# Patient Record
Sex: Female | Born: 1960 | Race: Black or African American | Hispanic: No | Marital: Single | Smoking: Former smoker
Health system: Southern US, Community
[De-identification: ages and names within clinical notes are randomized; demographics above are authoritative.]

## PROBLEM LIST (undated history)

## (undated) DIAGNOSIS — C50919 Malignant neoplasm of unspecified site of unspecified female breast: Secondary | ICD-10-CM

## (undated) DIAGNOSIS — C801 Malignant (primary) neoplasm, unspecified: Secondary | ICD-10-CM

## (undated) DIAGNOSIS — J449 Chronic obstructive pulmonary disease, unspecified: Secondary | ICD-10-CM

## (undated) DIAGNOSIS — Z923 Personal history of irradiation: Secondary | ICD-10-CM

## (undated) HISTORY — PX: ABDOMINAL HYSTERECTOMY: SHX81

---

## 2005-07-21 ENCOUNTER — Emergency Department: Payer: Self-pay | Admitting: Unknown Physician Specialty

## 2006-03-18 ENCOUNTER — Emergency Department: Payer: Self-pay | Admitting: Emergency Medicine

## 2007-03-17 ENCOUNTER — Inpatient Hospital Stay: Payer: Self-pay | Admitting: Internal Medicine

## 2007-03-17 ENCOUNTER — Other Ambulatory Visit: Payer: Self-pay

## 2007-05-30 ENCOUNTER — Emergency Department: Payer: Self-pay | Admitting: Emergency Medicine

## 2007-06-19 ENCOUNTER — Inpatient Hospital Stay: Payer: Self-pay | Admitting: Internal Medicine

## 2007-11-05 ENCOUNTER — Emergency Department: Payer: Self-pay | Admitting: Emergency Medicine

## 2007-11-05 ENCOUNTER — Other Ambulatory Visit: Payer: Self-pay

## 2008-02-24 ENCOUNTER — Emergency Department: Payer: Self-pay | Admitting: Emergency Medicine

## 2008-04-17 ENCOUNTER — Emergency Department: Payer: Self-pay | Admitting: Emergency Medicine

## 2009-02-03 DIAGNOSIS — Z923 Personal history of irradiation: Secondary | ICD-10-CM

## 2009-02-03 DIAGNOSIS — C50919 Malignant neoplasm of unspecified site of unspecified female breast: Secondary | ICD-10-CM

## 2009-02-03 HISTORY — PX: BREAST LUMPECTOMY: SHX2

## 2009-02-03 HISTORY — DX: Malignant neoplasm of unspecified site of unspecified female breast: C50.919

## 2009-02-03 HISTORY — PX: BREAST BIOPSY: SHX20

## 2009-02-03 HISTORY — DX: Personal history of irradiation: Z92.3

## 2009-02-07 ENCOUNTER — Ambulatory Visit: Payer: Self-pay | Admitting: Family Medicine

## 2009-03-20 ENCOUNTER — Ambulatory Visit: Payer: Self-pay | Admitting: Surgery

## 2009-03-30 ENCOUNTER — Ambulatory Visit: Payer: Self-pay | Admitting: Surgery

## 2009-04-04 ENCOUNTER — Ambulatory Visit: Payer: Self-pay | Admitting: Surgery

## 2009-04-26 ENCOUNTER — Ambulatory Visit: Payer: Self-pay | Admitting: Surgery

## 2009-05-04 ENCOUNTER — Ambulatory Visit: Payer: Self-pay | Admitting: Radiation Oncology

## 2009-05-15 ENCOUNTER — Ambulatory Visit: Payer: Self-pay | Admitting: Radiation Oncology

## 2009-06-03 ENCOUNTER — Ambulatory Visit: Payer: Self-pay | Admitting: Radiation Oncology

## 2009-06-18 ENCOUNTER — Ambulatory Visit: Payer: Self-pay | Admitting: Podiatry

## 2009-06-22 ENCOUNTER — Ambulatory Visit: Payer: Self-pay | Admitting: Podiatry

## 2009-07-04 ENCOUNTER — Ambulatory Visit: Payer: Self-pay | Admitting: Radiation Oncology

## 2009-08-03 ENCOUNTER — Ambulatory Visit: Payer: Self-pay | Admitting: Radiation Oncology

## 2009-12-11 ENCOUNTER — Ambulatory Visit: Payer: Self-pay | Admitting: Podiatry

## 2009-12-14 ENCOUNTER — Ambulatory Visit: Payer: Self-pay | Admitting: Podiatry

## 2010-08-24 ENCOUNTER — Emergency Department: Payer: Self-pay | Admitting: *Deleted

## 2011-06-22 ENCOUNTER — Inpatient Hospital Stay: Payer: Self-pay | Admitting: Internal Medicine

## 2011-06-22 LAB — CBC
HCT: 44.6 % (ref 35.0–47.0)
HGB: 14.7 g/dL (ref 12.0–16.0)
MCH: 30.5 pg (ref 26.0–34.0)
Platelet: 280 10*3/uL (ref 150–440)
WBC: 8.8 10*3/uL (ref 3.6–11.0)

## 2011-06-22 LAB — COMPREHENSIVE METABOLIC PANEL
Albumin: 3.7 g/dL (ref 3.4–5.0)
Anion Gap: 8 (ref 7–16)
Co2: 29 mmol/L (ref 21–32)
Creatinine: 1.25 mg/dL (ref 0.60–1.30)
EGFR (Non-African Amer.): 49 — ABNORMAL LOW
Potassium: 4.1 mmol/L (ref 3.5–5.1)
SGOT(AST): 68 U/L — ABNORMAL HIGH (ref 15–37)
SGPT (ALT): 97 U/L — ABNORMAL HIGH

## 2011-06-22 LAB — CK TOTAL AND CKMB (NOT AT ARMC): CK-MB: 2.9 ng/mL (ref 0.5–3.6)

## 2011-06-22 LAB — TROPONIN I: Troponin-I: <0.02 ng/mL

## 2011-06-23 DIAGNOSIS — R069 Unspecified abnormalities of breathing: Secondary | ICD-10-CM

## 2011-06-23 LAB — HEMOGLOBIN A1C: Hemoglobin A1C: 5 % (ref 4.2–6.3)

## 2011-06-23 LAB — CBC WITH DIFFERENTIAL/PLATELET
Basophil #: 0 10*3/uL (ref 0.0–0.1)
Eosinophil #: 0 10*3/uL (ref 0.0–0.7)
Eosinophil %: 0 %
Lymphocyte #: 0.7 10*3/uL — ABNORMAL LOW (ref 1.0–3.6)
Monocyte #: 0.1 x10 3/mm — ABNORMAL LOW (ref 0.2–0.9)
Neutrophil %: 89.5 %
RDW: 12.5 % (ref 11.5–14.5)

## 2011-06-23 LAB — COMPREHENSIVE METABOLIC PANEL
Albumin: 3.4 g/dL (ref 3.4–5.0)
Anion Gap: 6 — ABNORMAL LOW (ref 7–16)
BUN: 10 mg/dL (ref 7–18)
Co2: 27 mmol/L (ref 21–32)
Creatinine: 0.72 mg/dL (ref 0.60–1.30)
Glucose: 161 mg/dL — ABNORMAL HIGH (ref 65–99)
Potassium: 4.7 mmol/L (ref 3.5–5.1)
Sodium: 137 mmol/L (ref 136–145)
Total Protein: 8 g/dL (ref 6.4–8.2)

## 2011-06-28 LAB — CULTURE, BLOOD (SINGLE)

## 2011-07-25 ENCOUNTER — Inpatient Hospital Stay: Payer: Self-pay | Admitting: Internal Medicine

## 2011-07-25 LAB — CBC
MCH: 30 pg (ref 26.0–34.0)
MCHC: 32.6 g/dL (ref 32.0–36.0)
MCV: 92 fL (ref 80–100)
RBC: 4.62 10*6/uL (ref 3.80–5.20)
RDW: 12.5 % (ref 11.5–14.5)
WBC: 7.3 10*3/uL (ref 3.6–11.0)

## 2011-07-25 LAB — BASIC METABOLIC PANEL
Anion Gap: 4 — ABNORMAL LOW (ref 7–16)
Co2: 29 mmol/L (ref 21–32)
Creatinine: 0.71 mg/dL (ref 0.60–1.30)
EGFR (African American): >60
EGFR (Non-African Amer.): >60
Osmolality: 274 (ref 275–301)
Potassium: 3.7 mmol/L (ref 3.5–5.1)
Sodium: 138 mmol/L (ref 136–145)

## 2011-07-25 LAB — CK TOTAL AND CKMB (NOT AT ARMC)
CK, Total: 107 U/L (ref 21–215)
CK-MB: 3.4 ng/mL (ref 0.5–3.6)

## 2011-07-25 LAB — TROPONIN I: Troponin-I: <0.02 ng/mL

## 2011-09-02 ENCOUNTER — Ambulatory Visit: Payer: Self-pay | Admitting: Family Medicine

## 2011-10-02 ENCOUNTER — Ambulatory Visit: Payer: Self-pay | Admitting: General Surgery

## 2011-10-15 ENCOUNTER — Ambulatory Visit: Payer: Self-pay | Admitting: General Surgery

## 2011-10-16 ENCOUNTER — Inpatient Hospital Stay: Payer: Self-pay | Admitting: Internal Medicine

## 2011-10-16 LAB — BASIC METABOLIC PANEL
Chloride: 106 mmol/L (ref 98–107)
Co2: 29 mmol/L (ref 21–32)
EGFR (African American): >60
EGFR (Non-African Amer.): >60
Glucose: 88 mg/dL (ref 65–99)
Osmolality: 279 (ref 275–301)
Potassium: 3.6 mmol/L (ref 3.5–5.1)

## 2011-10-16 LAB — HEPATIC FUNCTION PANEL A (ARMC)
Alkaline Phosphatase: 81 U/L (ref 50–136)
Bilirubin, Direct: <0.05 mg/dL (ref 0.00–0.20)
Bilirubin,Total: 0.7 mg/dL (ref 0.2–1.0)
SGOT(AST): 83 U/L — ABNORMAL HIGH (ref 15–37)
SGPT (ALT): 129 U/L — ABNORMAL HIGH (ref 12–78)
Total Protein: 8 g/dL (ref 6.4–8.2)

## 2011-10-16 LAB — CBC
MCH: 30.3 pg (ref 26.0–34.0)
MCV: 92 fL (ref 80–100)
Platelet: 246 10*3/uL (ref 150–440)
RBC: 4.89 10*6/uL (ref 3.80–5.20)
RDW: 13.2 % (ref 11.5–14.5)

## 2011-10-16 LAB — CK TOTAL AND CKMB (NOT AT ARMC): CK, Total: 99 U/L (ref 21–215)

## 2011-10-16 LAB — TROPONIN I
Troponin-I: 0.07 ng/mL — ABNORMAL HIGH
Troponin-I: 0.06 ng/mL — ABNORMAL HIGH

## 2011-10-17 LAB — LIPID PANEL
HDL Cholesterol: 52 mg/dL (ref 40–60)
Triglycerides: 57 mg/dL (ref 0–200)
VLDL Cholesterol, Calc: 11 mg/dL (ref 5–40)

## 2011-10-17 LAB — TROPONIN I: Troponin-I: 0.04 ng/mL

## 2011-10-27 ENCOUNTER — Ambulatory Visit: Payer: Self-pay | Admitting: Gynecologic Oncology

## 2011-10-28 ENCOUNTER — Ambulatory Visit: Payer: Self-pay | Admitting: Gynecologic Oncology

## 2011-11-02 LAB — COMPREHENSIVE METABOLIC PANEL
Albumin: 3.5 g/dL (ref 3.4–5.0)
Alkaline Phosphatase: 84 U/L (ref 50–136)
BUN: 9 mg/dL (ref 7–18)
Bilirubin,Total: 0.5 mg/dL (ref 0.2–1.0)
Calcium, Total: 8.6 mg/dL (ref 8.5–10.1)
Chloride: 106 mmol/L (ref 98–107)
Creatinine: 1.08 mg/dL (ref 0.60–1.30)
SGPT (ALT): 107 U/L — ABNORMAL HIGH (ref 12–78)
Total Protein: 7.5 g/dL (ref 6.4–8.2)

## 2011-11-02 LAB — CBC WITH DIFFERENTIAL/PLATELET
Basophil #: 0.1 10*3/uL (ref 0.0–0.1)
Basophil %: 1.1 %
Eosinophil #: 0.5 10*3/uL (ref 0.0–0.7)
HGB: 13.9 g/dL (ref 12.0–16.0)
Lymphocyte %: 25.1 %
MCH: 29.9 pg (ref 26.0–34.0)
MCHC: 32.9 g/dL (ref 32.0–36.0)
Monocyte #: 0.8 x10 3/mm (ref 0.2–0.9)
Monocyte %: 8.7 %
Neutrophil %: 59.7 %
RDW: 12.6 % (ref 11.5–14.5)

## 2011-11-03 ENCOUNTER — Inpatient Hospital Stay: Payer: Self-pay | Admitting: Internal Medicine

## 2011-11-04 ENCOUNTER — Ambulatory Visit: Payer: Self-pay | Admitting: Gynecologic Oncology

## 2011-11-08 LAB — CULTURE, BLOOD (SINGLE)

## 2011-11-11 ENCOUNTER — Ambulatory Visit: Payer: Self-pay

## 2011-11-11 LAB — CBC
MCHC: 34.1 g/dL (ref 32.0–36.0)
MCV: 91 fL (ref 80–100)
Platelet: 277 10*3/uL (ref 150–440)
RDW: 12.7 % (ref 11.5–14.5)
WBC: 8.9 10*3/uL (ref 3.6–11.0)

## 2011-11-18 ENCOUNTER — Inpatient Hospital Stay: Payer: Self-pay

## 2011-11-19 LAB — HEMATOCRIT: HCT: 35.1 % (ref 35.0–47.0)

## 2012-06-22 ENCOUNTER — Emergency Department: Payer: Self-pay | Admitting: Unknown Physician Specialty

## 2012-06-22 LAB — BASIC METABOLIC PANEL
BUN: 21 mg/dL — ABNORMAL HIGH (ref 7–18)
Chloride: 107 mmol/L (ref 98–107)
Co2: 27 mmol/L (ref 21–32)
Creatinine: 1.64 mg/dL — ABNORMAL HIGH (ref 0.60–1.30)
EGFR (African American): 41 — ABNORMAL LOW
EGFR (Non-African Amer.): 35 — ABNORMAL LOW
Glucose: 113 mg/dL — ABNORMAL HIGH (ref 65–99)
Osmolality: 279 (ref 275–301)
Sodium: 138 mmol/L (ref 136–145)

## 2012-06-22 LAB — CBC
HCT: 41 % (ref 35.0–47.0)
HGB: 13.7 g/dL (ref 12.0–16.0)
MCH: 29.7 pg (ref 26.0–34.0)
MCHC: 33.5 g/dL (ref 32.0–36.0)
MCV: 89 fL (ref 80–100)
RBC: 4.61 10*6/uL (ref 3.80–5.20)

## 2012-07-14 ENCOUNTER — Inpatient Hospital Stay: Payer: Self-pay | Admitting: Internal Medicine

## 2012-07-14 LAB — CBC WITH DIFFERENTIAL/PLATELET
Basophil #: 0.1 10*3/uL (ref 0.0–0.1)
Basophil %: 1.7 %
Eosinophil #: 0.8 10*3/uL — ABNORMAL HIGH (ref 0.0–0.7)
Eosinophil %: 10 %
HCT: 41 % (ref 35.0–47.0)
HGB: 13.8 g/dL (ref 12.0–16.0)
Lymphocyte #: 2.3 10*3/uL (ref 1.0–3.6)
MCHC: 33.7 g/dL (ref 32.0–36.0)
Monocyte %: 8.3 %
Neutrophil #: 3.9 10*3/uL (ref 1.4–6.5)
Platelet: 244 10*3/uL (ref 150–440)

## 2012-07-14 LAB — BASIC METABOLIC PANEL
Anion Gap: 3 — ABNORMAL LOW (ref 7–16)
BUN: 13 mg/dL (ref 7–18)
Chloride: 105 mmol/L (ref 98–107)
Co2: 32 mmol/L (ref 21–32)
Potassium: 4 mmol/L (ref 3.5–5.1)
Sodium: 140 mmol/L (ref 136–145)

## 2012-07-15 LAB — CBC WITH DIFFERENTIAL/PLATELET
Basophil #: 0 10*3/uL (ref 0.0–0.1)
Eosinophil #: 0 10*3/uL (ref 0.0–0.7)
Eosinophil %: 0.3 %
HCT: 39.9 % (ref 35.0–47.0)
HGB: 13.5 g/dL (ref 12.0–16.0)
Lymphocyte %: 7 %
MCHC: 33.8 g/dL (ref 32.0–36.0)
MCV: 89 fL (ref 80–100)
Monocyte #: 0 x10 3/mm — ABNORMAL LOW (ref 0.2–0.9)
Monocyte %: 0.7 %
Neutrophil #: 6.5 10*3/uL (ref 1.4–6.5)
Neutrophil %: 91.5 %
Platelet: 240 10*3/uL (ref 150–440)
RBC: 4.49 10*6/uL (ref 3.80–5.20)
WBC: 7.1 10*3/uL (ref 3.6–11.0)

## 2012-07-15 LAB — BASIC METABOLIC PANEL
BUN: 14 mg/dL (ref 7–18)
Calcium, Total: 8.9 mg/dL (ref 8.5–10.1)
Creatinine: 1 mg/dL (ref 0.60–1.30)
EGFR (African American): >60
Glucose: 220 mg/dL — ABNORMAL HIGH (ref 65–99)
Potassium: 3.8 mmol/L (ref 3.5–5.1)

## 2012-07-15 LAB — TSH: Thyroid Stimulating Horm: 0.353 u[IU]/mL — ABNORMAL LOW

## 2012-07-15 LAB — T4, FREE: Free Thyroxine: 0.96 ng/dL (ref 0.76–1.46)

## 2012-07-16 LAB — BASIC METABOLIC PANEL
Anion Gap: 6 — ABNORMAL LOW (ref 7–16)
Chloride: 105 mmol/L (ref 98–107)
Co2: 29 mmol/L (ref 21–32)
EGFR (African American): >60
EGFR (Non-African Amer.): >60
Potassium: 4.2 mmol/L (ref 3.5–5.1)

## 2012-07-16 LAB — CK-MB: CK-MB: 1.3 ng/mL (ref 0.5–3.6)

## 2012-07-16 LAB — CK
CK, Total: 48 U/L (ref 21–215)
CK, Total: 36 U/L (ref 21–215)

## 2012-07-24 ENCOUNTER — Emergency Department: Payer: Self-pay | Admitting: Emergency Medicine

## 2012-07-24 LAB — CBC
HCT: 39.7 % (ref 35.0–47.0)
HGB: 13.4 g/dL (ref 12.0–16.0)
MCHC: 33.7 g/dL (ref 32.0–36.0)
MCV: 89 fL (ref 80–100)
RBC: 4.44 10*6/uL (ref 3.80–5.20)
WBC: 14 10*3/uL — ABNORMAL HIGH (ref 3.6–11.0)

## 2012-07-24 LAB — CK TOTAL AND CKMB (NOT AT ARMC): CK-MB: 1.2 ng/mL (ref 0.5–3.6)

## 2012-07-24 LAB — BASIC METABOLIC PANEL
BUN: 16 mg/dL (ref 7–18)
Calcium, Total: 8.3 mg/dL — ABNORMAL LOW (ref 8.5–10.1)
Chloride: 106 mmol/L (ref 98–107)
Potassium: 3.7 mmol/L (ref 3.5–5.1)

## 2012-07-24 LAB — PROTIME-INR: Prothrombin Time: 12 secs (ref 11.5–14.7)

## 2012-10-03 ENCOUNTER — Emergency Department: Payer: Self-pay | Admitting: Emergency Medicine

## 2012-10-03 LAB — BASIC METABOLIC PANEL
Calcium, Total: 8.5 mg/dL (ref 8.5–10.1)
Co2: 31 mmol/L (ref 21–32)
Creatinine: 0.98 mg/dL (ref 0.60–1.30)
EGFR (Non-African Amer.): >60
Potassium: 3.7 mmol/L (ref 3.5–5.1)
Sodium: 140 mmol/L (ref 136–145)

## 2012-10-03 LAB — TROPONIN I: Troponin-I: <0.02 ng/mL

## 2012-10-03 LAB — CBC
HCT: 41.4 % (ref 35.0–47.0)
MCH: 30.2 pg (ref 26.0–34.0)
Platelet: 325 10*3/uL (ref 150–440)
RBC: 4.65 10*6/uL (ref 3.80–5.20)
RDW: 12.5 % (ref 11.5–14.5)

## 2012-12-13 ENCOUNTER — Emergency Department: Payer: Self-pay | Admitting: Emergency Medicine

## 2012-12-13 LAB — BASIC METABOLIC PANEL
Anion Gap: 5 — ABNORMAL LOW (ref 7–16)
BUN: 13 mg/dL (ref 7–18)
Calcium, Total: 8.2 mg/dL — ABNORMAL LOW (ref 8.5–10.1)
Creatinine: 0.79 mg/dL (ref 0.60–1.30)
EGFR (African American): >60
Osmolality: 281 (ref 275–301)
Sodium: 140 mmol/L (ref 136–145)

## 2012-12-13 LAB — CBC
HCT: 38.4 % (ref 35.0–47.0)
HGB: 13.1 g/dL (ref 12.0–16.0)
MCHC: 34.2 g/dL (ref 32.0–36.0)
MCV: 88 fL (ref 80–100)
Platelet: 256 10*3/uL (ref 150–440)
RDW: 12.7 % (ref 11.5–14.5)

## 2013-11-18 ENCOUNTER — Ambulatory Visit: Payer: Self-pay | Admitting: Physician Assistant

## 2014-05-10 ENCOUNTER — Inpatient Hospital Stay
Admit: 2014-05-10 | Disposition: A | Discharge status: Home / Self Care | Payer: Self-pay | Attending: Internal Medicine | Admitting: Internal Medicine

## 2014-05-10 LAB — COMPREHENSIVE METABOLIC PANEL
AST: 68 U/L — AB
Albumin: 3.7 g/dL
Alkaline Phosphatase: 60 U/L
Anion Gap: 7 (ref 7–16)
BUN: 12 mg/dL
Bilirubin,Total: 0.4 mg/dL
CALCIUM: 8.5 mg/dL — AB
CHLORIDE: 106 mmol/L
Co2: 29 mmol/L
Creatinine: 0.84 mg/dL
EGFR (Non-African Amer.): >60
Glucose: 96 mg/dL
Potassium: 3.4 mmol/L — ABNORMAL LOW
SGPT (ALT): 86 U/L — ABNORMAL HIGH
SODIUM: 142 mmol/L
Total Protein: 7.2 g/dL

## 2014-05-10 LAB — CBC
HCT: 36.6 % (ref 35.0–47.0)
HGB: 12.1 g/dL (ref 12.0–16.0)
MCH: 29.1 pg (ref 26.0–34.0)
MCHC: 33 g/dL (ref 32.0–36.0)
MCV: 88 fL (ref 80–100)
Platelet: 258 10*3/uL (ref 150–440)
RBC: 4.15 10*6/uL (ref 3.80–5.20)
RDW: 12.5 % (ref 11.5–14.5)
WBC: 8.6 10*3/uL (ref 3.6–11.0)

## 2014-05-10 LAB — CK TOTAL AND CKMB (NOT AT ARMC)
CK, TOTAL: 148 U/L
CK-MB: 4.4 ng/mL

## 2014-05-10 LAB — TROPONIN I

## 2014-05-10 LAB — APTT: ACTIVATED PTT: 27.5 s (ref 23.6–35.9)

## 2014-05-10 LAB — PROTIME-INR
INR: 1
Prothrombin Time: 12.9 secs

## 2014-05-11 LAB — BASIC METABOLIC PANEL
ANION GAP: 6 — AB (ref 7–16)
BUN: 13 mg/dL
CO2: 27 mmol/L
Calcium, Total: 8.3 mg/dL — ABNORMAL LOW
Chloride: 105 mmol/L
Creatinine: 0.69 mg/dL
Glucose: 175 mg/dL — ABNORMAL HIGH
Potassium: 4.6 mmol/L
SODIUM: 138 mmol/L

## 2014-05-11 LAB — CBC WITH DIFFERENTIAL/PLATELET
BASOS PCT: 0.2 %
Basophil #: 0 10*3/uL (ref 0.0–0.1)
Eosinophil #: 0 10*3/uL (ref 0.0–0.7)
Eosinophil %: 0.1 %
HCT: 35.1 % (ref 35.0–47.0)
HGB: 11.2 g/dL — ABNORMAL LOW (ref 12.0–16.0)
LYMPHS ABS: 0.6 10*3/uL — AB (ref 1.0–3.6)
LYMPHS PCT: 10.5 %
MCH: 28.4 pg (ref 26.0–34.0)
MCHC: 31.9 g/dL — ABNORMAL LOW (ref 32.0–36.0)
MCV: 89 fL (ref 80–100)
MONOS PCT: 0.9 %
Monocyte #: 0 x10 3/mm — ABNORMAL LOW (ref 0.2–0.9)
Neutrophil #: 5 10*3/uL (ref 1.4–6.5)
Neutrophil %: 88.3 %
Platelet: 255 10*3/uL (ref 150–440)
RBC: 3.94 10*6/uL (ref 3.80–5.20)
RDW: 12.6 % (ref 11.5–14.5)
WBC: 5.7 10*3/uL (ref 3.6–11.0)

## 2014-05-15 LAB — CULTURE, BLOOD (SINGLE)

## 2014-05-23 NOTE — H&P (Signed)
PATIENT NAME:  [name], [name] MR#:  [id number] DATE OF BIRTH:  [birth year]  DATE OF ADMISSION:  11/03/2011  PRIMARY CARE PHYSICIAN: Dr. Salome Holmes REFERRING PHYSICIAN: Dr. Pollie Friar   CHIEF COMPLAINT: Increased shortness of breath.   HISTORY OF PRESENT ILLNESS: [name] [name] is a 54 year old African American female with history of chronic obstructive pulmonary disease, tobacco abuse. She continued to smoke despite her recent admission when she was admitted on 10/16/2011, discharged on 10/20/2011. She was smoking 1/2 pack a day. She tells me that after discharge from the hospital after a few days, in particular after she finished her oral prednisone tapering, she started to have increased shortness of breath. This had progressed over the last one week. She decided to stop smoking five days ago, however, her symptoms worsened and in the last 24 hours she was barely able to breathe. She was brought to the hospital, placed on BiPAP as she was in severe respiratory distress. Patient is now in the process to be admitted to the hospital for further evaluation and treatment. While she is in the Emergency Department she started to improve. They were able to get her off the BiPAP.   REVIEW OF SYSTEMS: CONSTITUTIONAL: Denies any fever. No chills. No night sweats but had mild fatigue. EYES: No blurring of vision. No double vision. ENT: No hearing impairment. No sore throat. No dysphagia. CARDIOVASCULAR: No chest pain but she had shortness of breath and wheezing. No syncope. No edema. RESPIRATORY: Reports wheezing and progressive increase in shortness of breath and respiratory distress. No chest pain. No cough. No sputum production. No hemoptysis. GASTROINTESTINAL: No abdominal pain. No vomiting. No diarrhea. GENITOURINARY: No dysuria. No frequency of urination. MUSCULOSKELETAL: No joint pain or swelling. No muscular pain or swelling. INTEGUMENTARY: No skin rash. No ulcers. NEUROLOGY: No focal weakness. No  seizure activity. No headache. PSYCHIATRY: No anxiety. No depression right now but she has history of anxiety. ENDOCRINE: No polyuria or polydipsia. No heat or cold intolerance.   PAST MEDICAL HISTORY: 1. Her recent admission was 09/12, discharged on 10/20/2011 treated for acute on chronic respiratory failure secondary to obstructive pulmonary disease exacerbation.  2. Tobacco abuse.  3. Chronic hepatitis C.  4. Recent finding of complex right ovarian cyst. She is having outpatient work-up with OB/GYN and oncology.  5. Breast cancer. 6. Drug abuse. 7. Depression, anxiety.   PAST SURGICAL HISTORY:  1. Breast cancer status post lumpectomy.  2. Status post hysterectomy.   SOCIAL HABITS: Chronic smoker, recently was smoking 1/2 pack a day. She quit five days ago. No alcohol or drug abuse currently.   FAMILY HISTORY: Her mother died from lung cancer. There is family history of bipolar disorder.   SOCIAL HISTORY: She is single, lives at home and she has three children. She lives on disability.   ADMISSION MEDICATIONS:  1. Singulair 10 mg a day. 2. Atrovent q.6 hours p.r.n. 3. Advair 250/50, 1 puff twice a day.  4. Spiriva 1 inhalation once a day.  5. Quetiapine 50 mg at bedtime.  6. Lexapro 10 mg a day. 7. Aspirin 81 mg a day. 8. Ativan 0.5 mg q.8 hours p.r.n.  9. Acetaminophen with oxycodone 5/325 q.6 hours p.r.n. for pain.   ALLERGIES: Penicillin causing skin rash and latex causing skin rash.   PHYSICAL EXAMINATION:  VITAL SIGNS: Blood pressure 120/73, respiratory rate 24 now is down to 20, pulse 99, temperature 98.4, oxygen saturation 96% while she is on oxygen.   GENERAL APPEARANCE: Middle-aged female  sitting on a stretcher in no acute distress at time of my examination. Her breathing is much better than earlier.   HEAD/NECK: No pallor. No icterus. No cyanosis.   ENT: Hearing was normal. Nasal mucosa, lips, tongue were normal. She is edentulous and has dentures.   EYES:  Normal eyelids and conjunctiva. Pupils about 5 mm, equal and reactive to light.   NECK: Supple. Trachea at midline. No thyromegaly. No cervical lymphadenopathy. No masses.   HEART: Normal S1, S2. No S3 or S4. No murmur. No gallop. No carotid bruits.   RESPIRATORY: Normal breathing pattern at time of my examination, few scattered wheezing and prolonged expiratory phase. No rales.   ABDOMEN: Soft without tenderness. No hepatosplenomegaly. No masses. No hernias.   SKIN: No ulcers. No subcutaneous nodules.   MUSCULOSKELETAL: No joint swelling. No clubbing.   NEUROLOGIC: Cranial nerves II through XII are intact. No focal motor deficit.   PSYCHIATRIC: Patient is alert, oriented x3. Mood and affect were normal.   LABORATORY, DIAGNOSTIC, AND RADIOLOGICAL DATA: Chest x-ray showed no consolidation, no effusion. Heart size was normal. There is prominence of the minor fissure on the right side. This is unchanged compared to her last chest x-ray. EKG showed normal sinus rhythm at rate of 97 per minute. Unremarkable EKG. Serum glucose 95, BUN 9, creatinine 1.08, sodium 143, potassium 4.4. Total protein 7.5, albumin 3.5, bilirubin 0.5, alkaline phosphatase 84, AST elevated 77 with ALT 107; this is comparable to her levels earlier in September, almost the same. CBC showed white count 8000, hemoglobin 13, hematocrit 42, platelet count 301.   ASSESSMENT:  1. Acute exacerbation of chronic obstructive pulmonary disease, probably precipitated by noncompliance and continuation of smoking.  2. Tobacco abuse.  3. Chronic hepatitis C. 4. Complex right ovarian cyst under workup by OB/GYN and oncology.   OTHER MEDICAL PROBLEMS:  1. Chronic hepatitis C.  2. Depression, anxiety.  3. History of breast cancer status post lumpectomy. 4. History of hysterectomy.   PLAN: Will admit the patient to the medical floor. Oxygen supplementation. Bronchodilator therapy with DuoNebs q.4 hours while awake, IV Solu-Medrol.  Continue home medications as listed above. I advised the patient to quit smoking and to keep home environment dust free. She is agreeable to that but she requested nicotine patch. I will start her on 14 mg daily.   TIME SPENT EVALUATING THIS PATIENT: Took more than 50 minutes.   ____________________________ Clovis Pu. Lenore Manner, MD amd:cms D: 11/03/2011 02:02:55 ET T: 11/03/2011 08:16:14 ET JOB#: [id number]  cc: Clovis Pu. Lenore Manner, MD, <Dictator> Salome Holmes, MD Mike Craze Irven Coe MD ELECTRONICALLY SIGNED 11/06/2011 22:28

## 2014-05-23 NOTE — Op Note (Signed)
PATIENT NAME:  [name] [name], [name] [name] MR#:  [id number] DATE OF BIRTH:  1961/01/17  DATE OF PROCEDURE:  11/18/2011  PREOPERATIVE DIAGNOSIS: Pelvic mass.   POSTOPERATIVE DIAGNOSIS: Mucinous cystadenoma of the right ovary.   PROCEDURES PERFORMED:  1. Exploratory laparotomy. 2. Lysis of adhesions.   3. Bilateral salpingo-oophorectomy.  4. Appendectomy.  5. Peritoneal biopsies. 6. Omental biopsies.   SURGEON: Weber Cooks, MD   ASSISTANT: Wonda Cheng. Rosenow, MD   ESTIMATED BLOOD LOSS: Ms. 75 mL.   COMPLICATIONS: None.   INDICATION FOR SURGERY: Ms. Males is a 54 year old patient who presented with a slightly complex pelvic mass. Therefore, decision was made to proceed with surgery.   FINDINGS: A 15 cm mass originating from the right ovary, smooth with some adhesion to the right pelvic sidewall but no excrescences or papillations. Normal left adnexa except for adhesions. No peritoneal lesion. Appendix, colon, and small bowel normal.    OPERATIVE REPORT: The patient had just completed laparoscopic cholecystectomy by Dr. Jamal Collin.   A midline incision was placed with a sharp knife and carried down through the fascia. The peritoneum was identified and entered. Incision was extended cephalad and caudad. Exploration was done with the above-mentioned findings. Adhesions between omentum and abdominal wall were lysed. Then a retractor was placed.   The bowel was inspected and found to be normal. It was then packed away with lap sponges. The pelvic sidewall was entered on the right side. Vessels and ureter were identified. The infundibulopelvic ligament was clamped, cut, simply ligated and stitch ligated with 0 Vicryl. The ureter was then freed from the peritoneum and pushed laterally so that adhesions between the mass and the pelvic sidewall could be lysed using cautery. A clamp was placed around the scars to the vaginal apex. Thus the mass could be completely removed and intact. Clamp was  replaced by a stitch using stitch ligature using 0 Vicryl.   Attention was then directed towards the left pelvic sidewall. Here the ovary was normal. The pelvic sidewall was entered. Vessels and ureter were identified. The infundibulopelvic ligament was clamped, cut, and simply ligated as well as stitch ligated using 0 Vicryl. Thus, the ovary was freed from the pelvic sidewall and the clamp was placed across the adhesions towards the vaginal apex. Thus, the ovary and tube could be removed in toto. The clamp was replaced by a stitch ligature using 0 Vicryl. Irrigation of the pelvis was performed. Biopsies were taken from the pelvic peritoneum. Several vessels had to be cauterized until adequate hemostasis was confirmed.   Attention was then directed towards the appendectomy. The mesentery was clamped, cut, and ligated using 0 Vicryl. The base of the appendix was clamped twice. Then a purse-string suture was placed and after removal of the appendix the stump was tied twice and buried in the purse-string suture. Hemostasis was adequate in this area.   Due to the adhesions, there were several holes within the lower omentum. Therefore, the lower omentum was removed by serially placing clamps and cutting pedicles which were ligated with 0 Vicryl. Hemostasis was noted to be adequate.   Irrigation was performed and adequate hemostasis confirmed in all areas. Lap sponges and retractors were removed. The fascia was closed with a running #1 loop PDS suture. The skin was closed with staples.   The patient tolerated the procedure well and was taken to the recovery room in stable condition. Pad, sponge, needle, and instrument counts were correct x2. The postoperative urine was clear.   ____________________________  Weber Cooks, MD bem:drc D: 11/18/2011 12:19:30 ET T: 11/18/2011 12:42:06 ET JOB#: [id number] cc: Weber Cooks, MD, <Dictator> Weber Cooks MD ELECTRONICALLY SIGNED 11/25/2011 17:18

## 2014-05-23 NOTE — Discharge Summary (Signed)
PATIENT NAME:  [name] [name], [name] [name] MR#:  [id number] DATE OF BIRTH:  [birth year]  DATE OF ADMISSION:  11/03/2011 DATE OF DISCHARGE:  11/04/2011  PRIMARY CARE PHYSICIAN: Salome Holmes, MD  DISCHARGE DIAGNOSES:  1. Acute respiratory failure.  2. Acute on chronic obstructive pulmonary disease.  3. Tobacco abuse.  4. Left ovarian complex cyst, has outpatient follow-up.   IMAGING STUDIES: Chest x-ray showed no acute abnormalities. It did show possible bronchitis versus interstitial pneumonitis.   CONSULTANTS: None.   ADMITTING HISTORY AND PHYSICAL: Please see detailed history and physical dictated by Dr. Lenore Manner on 11/03/2011. In brief, this is a 54 year old African American female patient with history of chronic obstructive pulmonary disease and tobacco abuse who presented to the Emergency Room complaining of worsening shortness of breath. The patient was found to be wheezing with no pneumonia on the x-ray and was placed on BiPAP and admitted to the hospitalist service.   HOSPITAL COURSE: The patient was transitioned from BiPAP to nasal cannula oxygen of 3 liters, continued on IV steroids, nebulizer, and antibiotics of Levaquin with which the patient improved well. Her cultures have been negative. On the day of discharge, the patient is saturating 91% on room air on ambulation and is being discharged home. She does have oxygen at home to use p.r.n. The patient will follow up with her primary care physician within a week.   On the day of discharge, the patient's lungs have no wheezing, good air entry, and is being discharged home in fair condition.   DISCHARGE MEDICATIONS:  1. Aspirin 81 mg oral once a day.  2. Escitalopram 10 mg oral once a day.  3. Seroquel 50 mg oral once a day.  4. Lorazepam 0.5 mg oral every eight hours as needed for anxiety.  5. Atrovent 2 puffs inhaled four times daily as needed.  6. Advair Diskus 250/50 inhaled twice a day.  7. Spiriva 18 mcg inhaled once a day.   8. Singulair 10 mg oral once a day.  9. Acetaminophen/oxycodone 325/5 mg one tablet orally every six hours as needed for pain.  10. Nicotine patch one patch transdermal once a day.  11. Prednisone 10 mg, start at 60 mg on day one and taper over 12 days.   DISCHARGE INSTRUCTIONS: Follow with primary care physician within a week. The patient will be on a regular diet with activity as tolerated. Use oxygen as needed, which is already at home. The patient is to return to the Emergency Room if she has worsening of her symptoms. This plan was discussed with the patient who has verbalized understanding and is okay with the plan.   TIME SPENT: Time spent today on discharge dictation along with coordinating care and counseling of the patient was 35 minutes. ____________________________ Leia Alf Tahjai Schetter, MD srs:slb D: 11/04/2011 14:16:11 ET T: 11/05/2011 10:08:43 ET JOB#: [id number]  cc: Alveta Heimlich R. Tykia Mellone, MD, <Dictator> Salome Holmes, MD Neita Carp MD ELECTRONICALLY SIGNED 11/06/2011 11:20

## 2014-05-23 NOTE — Op Note (Signed)
PATIENT NAME:  [name] [name], [name] [name] MR#:  [id number] DATE OF BIRTH:  22-Apr-1960  DATE OF PROCEDURE:  11/18/2011  PREOPERATIVE DIAGNOSES:  1. Cholelithiasis.  2. Ovarian cyst.   POSTOPERATIVE DIAGNOSES:  1. Cholelithiasis. 2. Ovarian cyst. 3. Small abdominal wall nodule under the peritoneum. 4. Four adhesions in the lower abdomen.   PROCEDURES PERFORMED: Laparoscopy, cholecystectomy with intraoperative cholangiogram, and excision of abdominal wall nodule.   DESCRIPTION OF PROCEDURE: The patient was put to sleep in the supine position, on the operating table. The abdomen was prepped and draped out as a sterile field. A small vertical incision was made just below the umbilicus and the fascia was exposed and lifted up and a Veress needle with the InnerD[employer] sleeve positioned in the peritoneal cavity and verified with the hanging drop method. Pneumoperitoneum was obtained and a 10 mm port was then placed. The camera was introduced with good visualization. The findings on scanning the abdomen revealed a low-lying liver which was mildly firm in consistency but did not show any nodules or other abnormalities. There appeared to be a moderate amount of adhesions involving the lower abdomen, in the suprapubic region, mostly from the omentum, and also noted in the abdominal wall a firm nodular lesion the size of a marble located just to the right of midline below the level of the port site place. The known ovarian cyst was not visible because of the adhesions. Attention was directed to the gallbladder. Epigastric and two lateral 5 mm ports were placed and the gallbladder was satisfactorily retracted cephalad. The Hartmann's pouch area was lifted out to expose the cystic duct, the cystic artery, and the cystic duct node. The common bile duct was also visualized and noted to be fairly normal in size. The cystic duct was fairly long. It was first freed and Kumar clamp and catheter were positioned and cholangiogram  was obtained. There was good filling of the bile ducts, both proximal and distal. There was a fairly long and tortuous cystic duct. There did not appear to be any filling defects in the common bile duct nor was there any obstruction to flow. The catheter was used to decompress the gallbladder and then removed. The cystic duct was hemoclipped and cut. The cystic artery was freed, hemoclipped and cut, and the gallbladder was dissected free from its bed using cautery for control of bleeding. Blood loss from this procedure itself was less than 20 mL. After ensuring hemostasis, the camera was positioned with the 5 mm scope, in the epigastric port site, and the gallbladder was brought out through the umbilical port site with a retrieval bag. Following this the ports were then removed. The port sites, other than for the umbilicus  area, were closed with subcuticular 4-0 Vicryl. The port site incision was extended downward and with careful entry into the abdominal cavity adhesions of the omentum were then taken down using cautery and ligatures of 3-0 Vicryl. The nodular lesion, which was about 1.5 cm in size, was excised out from in front of the peritoneum and sent off separately. The adhesions after being cleared had good access to the pelvic area showing a fairly large left ovarian cyst. At this point, the procedure was turned over to Dr. Laurey Morale and Dr. Jacquelyne Balint for completion of ovarian cystectomy and removal of the remnant of the uterus and any additional procedures deemed necessary based on frozen section from the ovarian cyst. This portion will be dictated them. The remained stable throughout the procedure, up to  this point.  ____________________________ S.Robinette Haines, MD sgs:slb D: 11/18/2011 10:12:22 ET T: 11/18/2011 10:44:03 ET JOB#: [id number]  cc: Synthia Innocent. Jamal Collin, MD, <Dictator> [facility] Robinette Haines MD ELECTRONICALLY SIGNED 11/20/2011 17:02

## 2014-05-23 NOTE — H&P (Signed)
PATIENT NAME:  [name] [name], ENGEBRETSEN MR#:  [id number] DATE OF BIRTH:  03-06-1960  DATE OF ADMISSION:  10/16/2011  PRIMARY CARE PHYSICIAN: Salome Holmes, MD    REFERRING PHYSICIAN:   Ferman Hamming, MD   CHIEF COMPLAINT: Shortness of breath, cough, and wheezing for three days.   HISTORY OF PRESENT ILLNESS: The patient is a 54 year old African American female with a history of chronic obstructive pulmonary disease, tobacco abuse, hepatitis C, remote history of drug abuse, breast cancer status post lumpectomy, presented to the ED with shortness of breath, cough, and wheezing for the past three days. The patient is alert, awake, and oriented. The patient said she got a cold about five days ago and started to have a cough, sputum, shortness of breath, and wheezing; but she ran out of Atrovent a few days and symptoms are getting worse, so she came to the ED for further evaluation. Her oxygen saturation was 91% on room air, and she was treated with Solu-Medrol nebulizer. Symptoms got better, however, her troponin level is 0.07. She was treated with aspirin and admitted for chronic obstructive pulmonary disease exacerbation.   PAST MEDICAL HISTORY: As mentioned above:  1. Chronic obstructive pulmonary disease.  2. Hepatitis C. 3. Tobacco abuse. 4. History of drug abuse. 5. Depression. 6. Breast cancer.   PAST SURGICAL HISTORY:  1. Breast cancer, status post lumpectomy.  2. Status post hysterectomy.   SOCIAL HISTORY: She smokes 1/2 pack a day for many years. She denies any alcohol drinking or illicit drugs.    FAMILY HISTORY: Positive for lung cancer and bipolar disorder.   ALLERGIES: Penicillin and latex.    HOME MEDICATIONS:  1. Spiriva 18 mcg inhalation once daily.  2. Singulair 10 mg p.o. daily.  3. Seroquel 25 mg p.o. daily.  4. Zithromax 250 mg p.o. daily.  5. Atrovent HFA 17 mcg inhalation, 2 puffs inhaled as needed.  6. Duo-Neb  nebulizer p.r.n.  7. Advair 250 mcg/50 mcg  inhalation, 1 puff inhalation twice daily.  REVIEW OF SYSTEMS: CONSTITUTIONAL: The patient denies any fever, chills but has  headache, dizziness and weakness. EYES: No double vision or blurred vision. ENT: No postnasal drip, epistaxis, slurred speech, or dysphagia. CARDIOVASCULAR: Chest pain with coughing but no palpitations, orthopnea, or nocturnal dyspnea. No leg edema. PULMONARY: Positive for cough, sputum, shortness of breath, wheezing, but no hemoptysis. GI: No abdominal pain, nausea, vomiting, or diarrhea. No melena or bloody stools. GU: No dysuria, hematuria, or incontinence. SKIN: No rash or jaundice. HEMATOLOGY: No easy bruising or bleeding. NEUROLOGY: No syncope, loss of consciousness or seizure.   PHYSICAL EXAMINATION:  VITAL SIGNS: Temperature 97.8, blood pressure 118/68 oxygen saturation 99% on room air, pulse 109, respirations 18.   GENERAL: The patient is alert, awake, oriented, in no acute distress.   HEENT: Pupils are round, equal, reactive to light and accommodation. Moist oral mucosa. Clear oropharynx.   NECK: Supple. No JVD or carotid bruits. No lymphadenopathy. No thyromegaly.   CARDIOVASCULAR: S1, S2 regular rate, mild tachycardia. No murmurs or gallops.   PULMONARY: Bilateral air entry, mild wheezing. No crackles or rales. No use of accessory muscles to breathe.   ABDOMEN: Soft. No distention or tenderness. No organomegaly. Bowel sounds present.  EXTREMITIES: No edema, clubbing, or cyanosis. No calf tenderness.   SKIN: No rash or jaundice.   NEUROLOGY: No syncope, alert and oriented x3. No focal deficit. Power five out of five. Sensation intact. Deep tendon reflexes 2+.   LABORATORY, DIAGNOSTIC AND RADIOLOGICAL DATA:  CT angiogram showed no pulmonary embolus, but there were emphysematous changes in both lungs, mild atelectasis versus scarring. No evidence of pneumonia. Chest x-ray showed mild atelectasis and/or pneumonia in the lung bases. WBC 6.3, hemoglobin 14.8,  platelets 246, glucose 88, BUN 7, creatinine 0.79. Electrolytes are normal. CK 99, CK-MB 1.3, and troponin 0.07. EKG showed normal sinus rhythm at 78 beats per minute.   IMPRESSION:  1. Chronic obstructive pulmonary disease exacerbation.  2. Tachycardia.  3. Elevated troponin.  4. Tobacco abuse.  5. History of breast cancer.   PLAN OF TREATMENT:  1. The patient will be admitted to a Medical floor with telemonitor.  We will continue Solu-Medrol nebulizer,  Topamax, continue Advair, Singulair and Spiriva.  2. For Elevated troponin, we will  start aspirin and follow up troponin level and lipid panel.  3. Smoking cessation, was counseled for about six minutes.   I discussed the patient's situation and plan of treatment with the patient.   TIME SPENT: About 55 minutes. ____________________________ Demetrios Loll, MD qc:cbb D: 10/16/2011 15:07:53 ET T: 10/16/2011 16:10:27 ET JOB#: [id number]  cc: Demetrios Loll, MD, <Dictator> Salome Holmes, MD Demetrios Loll MD ELECTRONICALLY SIGNED 10/17/2011 18:31

## 2014-05-23 NOTE — Discharge Summary (Signed)
PATIENT NAME:  [name] [name], [name] [name] MR#:  [id number] DATE OF BIRTH:  [birth year]  DATE OF ADMISSION:  10/16/2011 DATE OF DISCHARGE:  10/20/2011  ADMITTING PHYSICIAN: Demetrios Loll, MD   DISCHARGING PHYSICIAN: Gladstone Lighter, MD  PRIMARY CARE PHYSICIAN: Salome Holmes, MD  PRIMARY OB/GYN: Verlene Mayer, MD  CONSULTANTS: None.  DISCHARGE DIAGNOSES:  1. Acute on chronic respiratory failure secondary to obstructive pulmonary disease exacerbation.  2. Tobacco use disorder.  3. Anxiety.  4. History of breast cancer status post left breast lumpectomy and radiation. 5. Complex right ovarian cyst, undergoing further work-up by OB/GYN.   DISCHARGE MEDICATIONS:  1. Singulair 10 mg p.o. daily.  2. Quetiapine 50 mg p.o. at bedtime.  3. Atrovent MDI two puffs every 6 hours p.r.n.  4. Atrovent nebulizer 25 mL every 6 hours as needed for shortness. 5. Advair 250/50 one puff twice a day.  6. Spiriva 18 mcg capsule one inhalation daily.  7. Prednisone taper 50 mg p.o. daily and taper off by 10 mg every day.  8. Aspirin 81 mg p.o. daily.  9. Percocet 5/325 mg one tablet every six hours p.r.n. for pain.  10. Lexapro 10 mg p.o. daily.  11. Ativan 0.5 mg every 8 hours p.r.n. for anxiety.   DISCHARGE HOME OXYGEN: Currently none, but the patient does have home oxygen.   DISCHARGE DIET: Low-sodium diet.   DISCHARGE ACTIVITY: As tolerated.  FOLLOWUP INSTRUCTIONS:  1. Follow up with Dr. Jacquelyne Balint as scheduled in the next week.  2. PCP followup in 1 to 2 weeks.  3. Smoking cessation advised.  DISCHARGE LABS/RADIOLOGIC STUDIES: Chest x-ray prior to discharge is showing mild lenticular opacities in mid and lower lung similar to prior and secondary to atelectasis.   LDL 59, HDL 52, total cholesterol 122 and triglycerides 57. Last set of troponin was normal at 0.04.   CT of the chest with contrast is showing emphysematous changes, mild atelectasis, and no evidence of acute PE or pneumonia or  lymphadenopathy.   WBC 6.3, hemoglobin 14.8, hematocrit 45.1, and platelet count 246.   Sodium 141, potassium 3.6, chloride 106, bicarbonate 29, BUN 7, creatinine 0.79, glucose 88, and calcium 8.7. ALT 129, AST 83, alkaline phosphatase 81, total bilirubin 7.7, and albumin 3.6.   BRIEF HOSPITAL COURSE: Ms. Reeser is a 54 year old African American female with past medical history significant for smoking and prior history of chronic obstructive pulmonary disease, on home oxygen, with history of breast cancer and also being worked up for complicated right ovarian cyst who presented secondary to upper respiratory symptoms with worsening wheezing and also dyspnea and ran out of her medications, especially the inhalers and nebulizer solution. So she was admitted for chronic obstructive pulmonary disease exacerbation. 1. Acute on chronic obstructive pulmonary disease exacerbation. She was started on IV Solu-Medrol. Chest x-ray and CT of chest did not reveal any other causes other than chronic obstructive pulmonary disease. Clinically she is improved respiratory wise and her lungs are clear prior to discharge, so Solu-Medrol is being tapered to oral prednisone. She is on MDIs, she is on Advair, Spiriva, Singulair, and also nebulizer treatments at home p.r.n. She also has home oxygen at home. However, her saturations were more than 90% at rest and on exertion at the time of discharge.  2. Anxiety. The patient has been going through a lot, according to her, secondary to being diagnosed with recent right ovarian cyst that could be malignant and prior history of breast cancer. Anxiety symptoms improved with Ativan  p.r.n. and was also started on Lexapro while in the hospital.  She was already on 25 mg of Seroquel at home. That has been increased to 50 mg at bedtime. For further changes, she needs to be following up with her PCP.  3. Tobacco use disorder. She was strongly counseled against smoking, especially with her  worsening chronic obstructive pulmonary disease and risk of cancer.  4. Complex right ovarian cyst. She is following with Dr. Laurey Morale from OB/GYN who has referred her to see Dr. Jacquelyne Balint and she has an appointment coming up sometime this week. She was advised to keep that appointment. 5. Her course has been otherwise uneventful in the hospital. She did have an episode of chest pain, which was mostly secondary to GI causes and relieved with Mylanta.   CODE STATUS: FULL CODE.  DISCHARGE CONDITION: Stable.   DISCHARGE DISPOSITION: Home.   TIME SPENT ON DISCHARGE: 45 minutes. ____________________________ Gladstone Lighter, MD rk:slb D: 10/20/2011 15:43:26 ET T: 10/21/2011 11:10:19 ET JOB#: [id number]  cc: Gladstone Lighter, MD, <Dictator> Salome Holmes, MD Wonda Cheng. Laurey Morale, MD Weber Cooks, MD Gladstone Lighter MD ELECTRONICALLY SIGNED 10/21/2011 17:08

## 2014-05-26 NOTE — Discharge Summary (Signed)
PATIENT NAME:  [name] [name], [name] [name] MR#:  [id number] DATE OF BIRTH:  05-26-1960  DATE OF ADMISSION:  07/14/2012 DATE OF DISCHARGE:  07/16/2012  1. Acute-on-chronic respiratory failure due to chronic obstructive pulmonary disease exacerbation.  2.  History of hepatitis C. 3.  Breast cancer.  4. Current smoker.   CONDITION ON DISCHARGE: Stable.   CODE STATUS: FULL CODE.   MEDICATIONS ON DISCHARGE:  1.  Aspirin 81 mg once a day.  2.  Atrovent 2 puffs 4 times a day as needed for shortness of breath.  3.  Singulair 10 mg oral tablet once a day.  4.  Albuterol/ipratropium 3 mL inhaled 4 times a day as needed for shortness of breath.  5.  Lorazepam 2 mg oral tablet 3 times a day as needed for anxiety.  6.  Seroquel 25 mg oral tablet once a day.  7.  Prednisone 10 mg tablets, start 60 mg and taper x by 5 mg daily until complete.  8.  Advair Diskus 1 puff inhaled 2 times a day.  9.  Spiriva 10 mcg inhalation capsule once a day.  10.  Levofloxacin 750 mg oral tablet once a day for 3 days. 11. Nicotine patch, transdermal once a day.   HOME OXYGEN: Yes. Oxygen delivery at home, 1 liter nasal cannula as needed; already has at home.   DIET: Regular diet with supplement, Ensure dietary supplement. Frequency 2 times per day.   DIET CONSISTENCY: Regular.   ACTIVITY LIMITATION: As tolerated.   TIMEFRAME FOR FOLLOWUP: 1 to 2 weeks with primary care physician; routine followup with PMD, Dr. Salome Holmes, primary care physician.   HISTORY OF PRESENT ILLNESS: The patient is 54 year old African American female with a past medical history of COPD, lives on oxygen as needed disease, with hepatitis C, tobacco abuse and drug abuse, depression, and a history of breast cancer, who presented to the ER with a chief complaint of shortness of breath and a productive cough for 4 days. She admitted that she  still smoked 1 to 2 packs a day, and started at the age of 36. Denies any sick contacts. No fever. The  patient was given breathing treatments . She was feeling better as the ER physician had planned to discharge the patient home, but during ambulation pulse ox dropped to 85% on room air and she was admitted for a COPD exacerbation.   HOSPITAL COURSE AND STAY: The patient was feeling much better  the next day, and she was still smoking a half-pack per day. Smoking cessation counseling was done. Walking in the morning caused just minimal exacerbation, but she was able to manage with oxygen so her respiratory failure, with COPD was managed with Solu-Medrol, and was also given Levaquin and oxygen with high steroids, started on Robitussin for her cough.   Other medical issues addressed in the hospital:   1.  Hepatitis C and breast cancer: She was advised to continue on outpatient follow-ups.  2.  Depression: She was continued on her medication.  3.  Tobacco abuse: She was counseled for 3 to 4 minutes for smoking cessation. She was feeling comfortable to leave for home after one day of treatment in hospital, and so we discharged her home.   IMPORTANT LAB RESULTS IN THE HOSPITAL: Creatinine was normal at 0.98. Troponin was less than 0.02. Thyroid-stimulating hormone 0.353, but free thyroxin was 0.196 WBC 7.8, hemoglobin 13.8, platelet count 244, and MCV 88. D-dimer was 0.27 at the beginning.   Chest x-ray  was showing no acute cardiopulmonary disease.   Total time spent on this discharge: Forty-five minutes.      ____________________________ Ceasar Lund Anselm Jungling, MD vgv:dm D: 07/20/2012 08:24:38 ET T: 07/20/2012 10:23:27 ET JOB#: [id number]  cc: Ceasar Lund. Anselm Jungling, MD, <Dictator> Salome Holmes, MD Vaughan Basta MD ELECTRONICALLY SIGNED 08/10/2012 14:15

## 2014-05-26 NOTE — H&P (Signed)
PATIENT NAME:  [name] [name], [name] [name] MR#:  [id number] DATE OF BIRTH:  1960/06/13  DATE OF ADMISSION:  07/14/2012  PRIMARY CARE PHYSICIAN:  Dr. Salome Holmes.   REFERRING PHYSICIAN:  Dr. Darl Householder.   CHIEF COMPLAINT:  Shortness of breath and productive cough for 4 days.   HISTORY OF PRESENT ILLNESS:  The patient is a 53 year old African American female with a past medical history of COPD, lives on oxygen as needed basis, hepatitis C, tobacco abuse and drug abuse, depression and a history of breast cancer is presenting to the ER with a chief complaint of shortness of breath and a productive cough for 4 days.  She admits that she still smokes 1/2 pack a day and started at age 83.  Denies any sick contacts.  No fever.  The patient was given breathing treatments and by mouth prednisone.  As she was feeling better the ER physician has planned to discharge the patient home, but during ambulation her pulse ox dropped down to 85% on room air.  Hospitalist team is called to admit the patient for acute exacerbation of COPD and the patient being hypoxemic with ambulation.  During my examination, the patient denies any shortness of breath after feeling better.  Denies any chest pain, fever or cold.   PAST MEDICAL HISTORY:  COPD,  tobacco abuse, depression, breast cancer, history of drug abuse.   PAST SURGICAL HISTORY:  Breast cancer, status post mastectomy, status post hysterectomy.   ALLERGIES:  She is allergic to PENICILLIN AND LATEX.   PSYCHOSOCIAL HISTORY:  Lives at home.  Denies any alcohol or illicit drug abuse, smokes half pack a day, started at age 79.   FAMILY HISTORY:  Mother had history of lung cancer and deceased.   REVIEW OF SYSTEMS:  CONSTITUTIONAL:  Denies any fever, fatigue, weakness, weight loss or weight gain.  EYES:  Denies any blurry vision, glaucoma, cataracts.  EARS, NOSE, THROAT:  No ear pain, discharge, snoring, postnasal drip.  RESPIRATION:  Positive productive cough, have COPD and  shortness of breath.  Denies any hemoptysis, painful respiration.  CARDIOVASCULAR:  No chest pain, palpitations, syncope.   GASTROINTESTINAL:  Denies any nausea, vomiting, diarrhea, GERD.  GENITOURINARY:  No dysuria or hematuria.  GYNECOLOGIC AND BREAST:  Denies any breast mass or vaginal discharge.  ENDOCRINE:  No polyuria, nocturia, thyroid problems.  HEMATOLOGIC AND LYMPHATIC:  Denies any anemia, easy bruising or bleeding.  INTEGUMENTARY:  No acne, rash, lesions.  MUSCULOSKELETAL:  No joint pain in the neck, back, shoulder.  Denies any gout. NEUROLOGIC:  No CVA, TIA, ataxia, dementia.   PSYCHIATRIC:  Has a history of depression.  Denies any ADD, OCD, bipolar disorder.   HOME MEDICATIONS:  Spiriva 18 mcg inhalation 1 inhalation once daily, Singulair 10 mg once daily, Seroquel 25 mg once daily, lorazepam 1 tablet by mouth 3 times a day, aspirin 81 mg once a day, albuterol ipratropium 3 mL q. 4 hours, Advair 250/50 1 puff inhalation 2 times a day.  PHYSICAL EXAMINATION:  VITAL SIGNS:  Temperature 98.2, pulse 95, respirations 16, blood pressure 111/71, pulse ox 99%.  GENERAL APPEARANCE:  Not under acute distress.  Moderately built and thin-looking African American female.  HEENT:  Normocephalic, atraumatic.  Pupils are equally reacting to light and accommodation.  No scleral icterus.  No conjunctival injection.  No sinus tenderness.  No postnasal drip.  No pharyngeal exudates.  NECK:  Supple.  No JVD.  No thyromegaly.  No lymphadenopathy.  LUNGS:  Bronchial breath sounds  are present.  Minimal end expiratory wheezing is present.  No crackles.  No anterior chest wall tenderness.  No accessory muscle usage.  CARDIOVASCULAR:  S1, S2 normal.  Regular rate and rhythm.  No murmurs. GASTROINTESTINAL:  Soft. Bowel sounds are positive in all 4 quadrants.  Nontender, nondistended.  No masses felt.  No hepatosplenomegaly. NEUROLOGIC:  Awake, alert and oriented x 3.  Motor and sensory are grossly intact.   Cranial nerves II through XII are intact.  Reflexes are 2+.  MUSCULOSKELETAL:  No joint effusion, tenderness or erythema.  No gout.  SKIN:  Warm to touch.  Normal turgor.  No rashes.  No lesions.  EXTREMITIES:  No peripheral edema, cyanosis or clubbing.  PSYCHIATRIC:  Normal mood and affect.    LABORATORY AND IMAGING STUDIES:  Chest x-ray, no acute findings.  Chronic COPD changes are noticed.  Glucose 92, BUN 13, creatinine 0.98.  Sodium 140, potassium 4.0, chloride 105, CO2 32, anion gap 3, GFR greater than 60, serum osmolality 279, calcium 8.9.  Troponin T less than 0.02.  WBC 7.8, hemoglobin 13.8, hematocrit 41.0, platelets 244.  D-dimer 0.27.   ASSESSMENT AND PLAN:  A 54 year old African American female who is still smoking, coming to the ER with a chief complaint of shortness of breath and productive cough for 4 days, will be admitted with the following assessment and plan.  1.  Acute exacerbation of chronic obstructive pulmonary disease.  We will give her Solu-Medrol, provide neb treatments, oxygen and levofloxacin IV.  2.  Tobacco abuse.  Counseled the patient and we will provide nicotine patch.  3.  History of hepatitis C, stable.  4.  Breast cancer, status post bilateral mastectomy and the patient is reporting that her breast cancer is inactive at this time.   5.  Depression, stable.  Denies any suicidal or homicidal ideation.   6.  We will provide the patient GI and deep vein thrombosis prophylaxis.  7.  She is FULL CODE.   Total time spent on the admission is 50 minutes.     ____________________________ Nicholes Mango, MD ag:ea D: 07/15/2012 00:45:00 ET T: 07/15/2012 01:39:24 ET JOB#: [id number]  cc: Dr. Daryll Drown, MD, <Dictator>   Nicholes Mango MD ELECTRONICALLY SIGNED 07/21/2012 6:45

## 2014-05-26 NOTE — H&P (Signed)
PATIENT NAME:  [name] [name], [name] [name] MR#:  [id number] DATE OF BIRTH:  20-Feb-1960  DATE OF ADMISSION:  07/15/2012  PRIMARY CARE PHYSICIAN:  Dr. Salome Holmes.   REFERRING PHYSICIAN:  Dr. Darl Householder.   CHIEF COMPLAINT:  Shortness of breath and productive cough for 4 days.   HISTORY OF PRESENT ILLNESS:  The patient is a 54 year old African American female with a past medical history of COPD, lives on oxygen as needed basis, hepatitis C, tobacco abuse and drug abuse, depression and a history of breast cancer is presenting to the ER with a chief complaint of shortness of breath and a productive cough for 4 days.  She admits that she still smokes 1/2 pack a day and started at age 32.  Denies any sick contacts.  No fever.  The patient was given breathing treatments and by mouth prednisone.  As she was feeling better the ER physician has planned to discharge the patient home, but during ambulation her pulse ox dropped down to 85% on room air.  Hospitalist team is called to admit the patient for acute exacerbation of COPD and the patient being hypoxemic with ambulation.  During my examination, the patient denies any shortness of breath after feeling better.  Denies any chest pain, fever or cold.   PAST MEDICAL HISTORY:  COPD,  tobacco abuse, depression, breast cancer, history of drug abuse.   PAST SURGICAL HISTORY:  Breast cancer, status post mastectomy, status post hysterectomy.   ALLERGIES:  She is allergic to PENICILLIN AND LATEX.   PSYCHOSOCIAL HISTORY:  Lives at home.  Denies any alcohol or illicit drug abuse, smokes half pack a day, started at age 72.   FAMILY HISTORY:  Mother had history of lung cancer and deceased.   REVIEW OF SYSTEMS:  CONSTITUTIONAL:  Denies any fever, fatigue, weakness, weight loss or weight gain.  EYES:  Denies any blurry vision, glaucoma, cataracts.  EARS, NOSE, THROAT:  No ear pain, discharge, snoring, postnasal drip.  RESPIRATION:  Positive productive cough, have COPD and  shortness of breath.  Denies any hemoptysis, painful respiration.  CARDIOVASCULAR:  No chest pain, palpitations, syncope.   GASTROINTESTINAL:  Denies any nausea, vomiting, diarrhea, GERD.  GENITOURINARY:  No dysuria or hematuria.  GYNECOLOGIC AND BREAST:  Denies any breast mass or vaginal discharge.  ENDOCRINE:  No polyuria, nocturia, thyroid problems.  HEMATOLOGIC AND LYMPHATIC:  Denies any anemia, easy bruising or bleeding.  INTEGUMENTARY:  No acne, rash, lesions.  MUSCULOSKELETAL:  No joint pain in the neck, back, shoulder.  Denies any gout. NEUROLOGIC:  No CVA, TIA, ataxia, dementia.   PSYCHIATRIC:  Has a history of depression.  Denies any ADD, OCD, bipolar disorder.   HOME MEDICATIONS:  Spiriva 18 mcg inhalation 1 inhalation once daily, Singulair 10 mg once daily, Seroquel 25 mg once daily, lorazepam 1 tablet by mouth 3 times a day, aspirin 81 mg once a day, albuterol ipratropium 3 mL q. 4 hours, Advair 250/50 1 puff inhalation 2 times a day.  PHYSICAL EXAMINATION:  VITAL SIGNS:  Temperature 98.2, pulse 95, respirations 16, blood pressure 111/71, pulse ox 99%.  GENERAL APPEARANCE:  Not under acute distress.  Moderately built and thin-looking African American female.  HEENT:  Normocephalic, atraumatic.  Pupils are equally reacting to light and accommodation.  No scleral icterus.  No conjunctival injection.  No sinus tenderness.  No postnasal drip.  No pharyngeal exudates.  NECK:  Supple.  No JVD.  No thyromegaly.  No lymphadenopathy.  LUNGS:  Bronchial breath sounds  are present.  Minimal end expiratory wheezing is present.  No crackles.  No anterior chest wall tenderness.  No accessory muscle usage.  CARDIOVASCULAR:  S1, S2 normal.  Regular rate and rhythm.  No murmurs. GASTROINTESTINAL:  Soft. Bowel sounds are positive in all 4 quadrants.  Nontender, nondistended.  No masses felt.  No hepatosplenomegaly. NEUROLOGIC:  Awake, alert and oriented x 3.  Motor and sensory are grossly intact.   Cranial nerves II through XII are intact.  Reflexes are 2+.  MUSCULOSKELETAL:  No joint effusion, tenderness or erythema.  No gout.  SKIN:  Warm to touch.  Normal turgor.  No rashes.  No lesions.  EXTREMITIES:  No peripheral edema, cyanosis or clubbing.  PSYCHIATRIC:  Normal mood and affect.    LABORATORY AND IMAGING STUDIES:  Chest x-ray, no acute findings.  Chronic COPD changes are noticed.  Glucose 92, BUN 13, creatinine 0.98.  Sodium 140, potassium 4.0, chloride 105, CO2 32, anion gap 3, GFR greater than 60, serum osmolality 279, calcium 8.9.  Troponin T less than 0.02.  WBC 7.8, hemoglobin 13.8, hematocrit 41.0, platelets 244.  D-dimer 0.27.   ASSESSMENT AND PLAN:  A 54 year old African American female who is still smoking, coming to the ER with a chief complaint of shortness of breath and productive cough for 4 days, will be admitted with the following assessment and plan.  1.  Acute exacerbation of chronic obstructive pulmonary disease.  We will give her Solu-Medrol, provide neb treatments, oxygen and levofloxacin IV.  2.  Tobacco abuse.  Counseled the patient and we will provide nicotine patch.  3.  History of hepatitis C, stable.  4.  Breast cancer, status post bilateral mastectomy and the patient is reporting that her breast cancer is inactive at this time.   5.  Depression, stable.  Denies any suicidal or homicidal ideation.   6.  We will provide the patient GI and deep vein thrombosis prophylaxis.  7.  She is FULL CODE.   Total time spent on the admission is 50 minutes.     ____________________________ Nicholes Mango, MD ag:ea D: 07/15/2012 00:45:21 ET T: 07/15/2012 01:39:24 ET JOB#: [id number]  cc: Nicholes Mango, MD, <Dictator> Dr. Daryll Drown MD ELECTRONICALLY SIGNED 07/16/2012 6:40

## 2014-05-28 NOTE — H&P (Signed)
PATIENT NAME:  [name] [name], [name] [name] MR#:  [id number] DATE OF BIRTH:  02-May-1960  DATE OF ADMISSION:  06/22/2011  REFERRING PHYSICIAN: Alger Simons, MD  FAMILY PHYSICIAN: [place]   REASON FOR ADMISSION: Acute respiratory failure with pneumonia.   HISTORY OF PRESENT ILLNESS: The patient is a 54 year old female with a history of chronic obstructive pulmonary disease/asthma as well as tobacco abuse who presents with a one to two day history of worsening shortness of breath. In the emergency room, the patient was found to be profoundly hypoxic requiring oxygen via high flow nasal cannula. Chest x-ray suggests pneumonia. She was given IV steroids with SVNs in the emergency room with only minimal improvement. She is now admitted for further evaluation.   PAST MEDICAL HISTORY:  1. Chronic obstructive pulmonary disease/asthma.  2. History of tobacco abuse.  3. Hepatitis C.  4. Remote history of drug abuse.  5. History of breast cancer status post lumpectomy.  6. Depression.  7. Status post hysterectomy.   MEDICATIONS:  1. Seroquel 25 mg p.o. at bedtime.  2. Phenergan 25 mg p.o. every six hours p.r.n. nausea and vomiting.  3. Percocet 1 to 2 p.o. every six hours p.r.n. pain.  4. Combivent 2 puffs every four hours while awake.  5. Advair 500/50 one puff twice a day.  ALLERGIES: Latex and penicillin.   SOCIAL HISTORY: The patient continues to smoke. No history of alcohol abuse.   FAMILY HISTORY: Positive for lung cancer and bipolar disorder.   REVIEW OF SYSTEMS: CONSTITUTIONAL: No fever or change in weight. EYES: No blurred or double vision. No glaucoma. ENT: No tinnitus or hearing loss. No nasal discharge or bleeding. No difficulty swallowing. RESPIRATORY: Denies hemoptysis. No painful respiration. CARDIOVASCULAR: No chest pain or orthopnea. No palpitations or syncope. GI: No nausea, vomiting, or diarrhea. No abdominal pain. No change in bowel habits. GU: No  dysuria or hematuria. No incontinence. ENDOCRINE: No polyuria or polydipsia. No heat or heat or cold intolerance. HEMATOLOGIC: The patient denies anemia, easy bruising, or bleeding. LYMPHATIC: No swollen glands. MUSCULOSKELETAL: The patient denies pain in her neck, back, shoulders, knees, or hips. No gout. NEUROLOGIC: The patient denies numbness, although she has generalized weakness. No migraines, stroke, or seizures. PSYCH: The patient denies anxiety, insomnia, or depression.   PHYSICAL EXAMINATION:   GENERAL: The patient is moderately ill appearing, in moderate respiratory distress.   VITALS: Vital signs were initially remarkable for a blood pressure of 147/105 with a heart rate of 110 and a respiratory rate of 40. She is afebrile.   HEENT: Normocephalic, atraumatic. Pupils are equally round and reactive to light and accommodation. Extraocular movements are intact. Sclerae are anicteric. Conjunctivae are clear. Oropharynx is dry but clear.   NECK: Supple without jugular venous distention or bruits. No adenopathy or thyromegaly was noted.   LUNGS: Scattered wheezes and rhonchi with decreased air movement. No rales. No dullness.   HEART: Rapid rate with a regular rhythm. Normal S1 and S2.   ABDOMEN: Soft and nontender with normoactive bowel sounds. No organomegaly or masses were appreciated. No hernias or bruits were noted.   EXTREMITIES: No clubbing, cyanosis, or edema. Pulses were 2+ bilaterally.   SKIN: Warm and dry without rash or lesions.   NEUROLOGIC: Cranial nerves II through XII grossly intact. Deep tendon reflexes were symmetric. Motor and sensory examination is nonfocal.   PSYCH: Exam revealed a patient who was alert and oriented to person, place, and time. She was cooperative and used  good judgment.  LABS/STUDIES: Chest x-ray reveals a right lower lobe infiltrate.   EKG reveals sinus tachycardia with no acute ischemic changes.   ABG revealed a pO2 of 68 on 2 liters.    Troponin was less than 0.02. White count was 8.8 with a hemoglobin of 14.7. Glucose was 105 with a BUN of 10 and a creatinine 1.25 with a GFR of 57. ALT was 97 with an AST of 68.   ASSESSMENT:  1. Acute respiratory failure.  2. Pneumonia.  3. Chronic obstructive pulmonary disease exacerbation.  4. Hepatitis C.  5. Abnormal liver function tests. 6. Mild renal insufficiency.  7. Depression.   PLAN: The patient will be admitted to the floor with oxygen. We will begin IV steroids with IV antibiotics and Xopenex and Atrovent SVNs. We will continue her Advair and add Spiriva. We will send off sputum for Gram stain and culture. Wean oxygen as tolerated. Continue Seroquel for now. Follow up chest x-ray in the morning. We will follow her LFTs closely. Further treatment and evaluation will depend upon the patient's progress.   TOTAL TIME SPENT: 50 minutes. ____________________________ Leonie Douglas Doy Hutching, MD jds:slb D: 06/22/2011 16:04:40 ET     T: 06/22/2011 16:18:11 ET       JOB#: [id number] cc: [place] Adean Milosevic Lennice Sites MD ELECTRONICALLY SIGNED 06/23/2011 7:48

## 2014-05-28 NOTE — H&P (Signed)
PATIENT NAME:  [name] [name], [name] [name] MR#:  [id number] DATE OF BIRTH:  1960/11/09  DATE OF ADMISSION:  07/25/2011  ER REFERRING PHYSICIAN: Pollie Friar, MD   PRIMARY CARE PHYSICIAN: None. The patient does not follow up with [facility] anymore.   CHIEF COMPLAINT: Shortness of breath, cough, yellow phlegm.    HISTORY OF PRESENT ILLNESS: The patient is a 54 year old female with past medical history of chronic obstructive pulmonary disease, tobacco abuse, hepatitis C, depression, who was recently admitted to [facility] May 2013 for acute hypoxic respiratory failure secondary to a chronic obstructive pulmonary disease exacerbation and pneumonia. The patient was treated with Levaquin and prednisone. She has completed her antibiotic and prednisone taper. The patient reports that she was well for a month and subsequently started developing cough, shortness of breath, yellow-green expectoration. She denies any fever. She reports some pleuritic-type chest pain. The patient is originally from [place] where the air is very dry. She reports that she has been in [place] for the past five years and feels bad. The hot air in [place] does not agree with her. She has been admitted three times to the hospital for three consecutive years in May for similar symptoms. Chest x-ray currently does not demonstrate any pneumonia.   ALLERGIES: Latex, penicillin.   PAST MEDICAL HISTORY:  1. Recent admission at [facility] 05/13 for acute chronic obstructive pulmonary disease exacerbation and pneumonia.  2. Chronic obstructive pulmonary disease.  3. Asthma. The patient is not on any home oxygen.  4. History of ongoing smoking. 5. Hepatitis C. 6. Remote history of drug abuse. 7. History of breast cancer, status post lumpectomy. 8. Depression. 9. Status post hysterectomy.   MEDICATIONS:  1. Advair 250/50, 1 puff b.i.d.  2. Atrovent 2 puffs p.r.n.  3. Seroquel 25 mg daily.  4. Aspirin 325 mg daily.   NOTE: The  patient is not taking lisinopril. She was has completed her Levaquin and prednisone taper.   SOCIAL HISTORY: The patient continues to smoke a pack per day. She denies any alcohol or drug abuse. She lives alone.   FAMILY HISTORY: Positive for lung cancer and bipolar disorder.   REVIEW OF SYSTEMS: CONSTITUTIONAL: Denies any fever. Reports fatigue, weakness. EYES: Denies any blurred or double vision. ENT: Denies any tinnitus, ear pain. RESPIRATORY: Reports cough, wheezing, shortness of breath. CARDIOVASCULAR: Reports pleuritic-type chest pain. Denies any palpitations or syncope. GASTROINTESTINAL: Denies any nausea, vomiting, diarrhea, or abdominal pain. GENITOURINARY: Denies any dysuria or hematuria.  ENDOCRINE: Denies any polyuria or nocturia. HEME/LYMPH: Denies any anemia, easy bruisability. INTEGUMENTARY: Denies any acne, rash. MUSCULOSKELETAL: Denies any swelling or gout. NEUROLOGICAL: Denies any numbness or weakness. PSYCHIATRIC: Denies any anxiety or depression.   PHYSICAL EXAMINATION:  VITAL SIGNS: Temperature 97, heart rate 100, respiratory rate 26, blood pressure 134/80, pulse oximetry 89 percent on room air.   GENERAL: The patient is a thin-built African American female sitting comfortably in bed, not in acute distress.   HEENT: Head: Atraumatic, normocephalic. Eyes: No pallor, icterus, or cyanosis. Pupils are equal, round and reactive to light and accommodation. Extraocular movements are intact. Wet mucous membranes. No oropharyngeal erythema or thrush.   NECK: Supple. No masses. No JVD. No thyromegaly or lymphadenopathy.   CHEST WALL: No tenderness to palpation. Not using accessory muscles of respiration. No intercostal muscle retractions.  LUNGS: Bilaterally decreased breath sounds. Currently, the patient is not having any wheezing or rhonchi.   CARDIOVASCULAR: S1, S2 regular. No murmur, rubs, or gallops.   ABDOMEN:  Soft, nontender, nondistended. No guarding, no rigidity. No  organomegaly. Normal bowel sounds.   SKIN: No rashes or lesions.   PERIPHERIES: No pedal edema, 2+ pedal pulses.    MUSCULOSKELETAL: No cyanosis or clubbing.   NEUROLOGICAL: Awake, alert, oriented x3. Cranial nerves are grossly intact.   PSYCHIATRIC: Normal mood and affect.   LABORATORY, DIAGNOSTIC AND RADIOLOGICAL DATA:  Chest x-ray shows no acute abnormalities. There is no evidence of any infiltrate.  Cardiac enzymes are negative.  CBC is normal.  Complete metabolic panel is normal.  BMP normal.   ASSESSMENT AND PLAN: A 54 year old female with a history of chronic obstructive pulmonary disease, ongoing smoking, presents with shortness of breath, cough, yellow expectoration.   1. Acute hypoxic respiratory failure due to chronic obstructive pulmonary disease/asthma exacerbation and acute bronchitis: We will admit the patient to the hospital and start her on oxygen supplementation, incentive spirometry, DuoNebs, Spiriva, Advair, empiric antibiotics and Solu-Medrol. We will obtain sputum cultures.  2. Smoking: The patient was counseled about cessation for more than three minutes. We will provide with a nicotine patch while in the hospital. 3. Depression: We will continue Seroquel.  4. Chronic pain syndrome: We will continue Vicodin p.r.n.   I reviewed all medical records, discussed with the ED physician, discussed with the patient the plan of care and management.   TIME SPENT: 75 minutes.   ____________________________ Cherre Huger, MD sp:cbb D: 07/25/2011 17:23:32 ET T: 07/25/2011 17:42:16 ET JOB#: [id number]  cc: Cherre Huger, MD, <Dictator> Cherre Huger MD ELECTRONICALLY SIGNED 07/25/2011 18:33

## 2014-05-28 NOTE — Discharge Summary (Signed)
PATIENT NAME:  [name] [name], [name] [name] MR#:  [id number] DATE OF BIRTH:  [birth year]  DATE OF ADMISSION:  06/22/2011 DATE OF DISCHARGE:  06/25/2011  DISCHARGE DIAGNOSIS: Hypoxic respiratory failure secondary to chronic obstructive pulmonary disease flare and pneumonia.   DISCHARGE MEDICATIONS:  1. Advair Diskus 250/50 one puff twice a day. 2. Atrovent 2 puffs p.r.n.  3. Seroquel 25 mg daily. 4. Aspirin 325 mg daily. 5. Levaquin 500 mg daily for five days. 6. Lisinopril 5 mg daily. 7. Prednisone dose taper 40 mg daily for two days, 30 mg daily for two days, 20 mg daily for two days, and 10 mg daily for two days.   CONSULTANTS: None.  HOSPITAL COURSE: This is a 54 year old female with history of chronic obstructive pulmonary disease and tobacco abuse who came in because of shortness of breath. Chest x-ray suggested pneumonia on admission. She was started high flow nasal cannula along with IV steroids and antibiotics. The patient was started on Xopenex and Atrovent nebulizers. Initially she required high flow nasal cannula, around 34%. We weaned off the oxygen from high flow nasal cannula to nasal cannula and she actually is on room air at this time saturating around 94% on room air. Her oxygen saturations are more than 90% on exertion on room air as well. She will be going home with steroids and Levaquin. She also has Atrovent that she can continue. She was advised to stop smoking and counseled about smoking cessation. The patient has a history of hepatitis C and also history of depression. She is on Seroquel. She can continue 25 mg at bedtime. Chest x-ray showed some interstitial edema. She did receive a dose of Lasix which improved her breathing and her ejection fraction is more than 26% with diastolic relaxation abnormality so we gave her an ACE inhibitor. She can followup with her primary doctor, Dr. Iona Beard. Her condition is stable. Her sugars are elevated secondary to steroids with hemoglobin A1c 5.   Blood cultures have been negative. D-dimer is 0.27.   DISCHARGE VITALS: Today the patient's temperature is 98.7, pulse 88, respirations 18, blood pressure 106/68, and saturation 94% on room air.   CONDITION ON DISCHARGE: Stable.   TIME SPENT ON DISCHARGE PREPARATION: More than 30 minutes. ____________________________ Epifanio Lesches, MD sk:slb D: 06/25/2011 11:47:04 ET T: 06/26/2011 11:58:28 ET JOB#: [id number]  cc: Epifanio Lesches, MD, <Dictator> Epifanio Lesches MD ELECTRONICALLY SIGNED 07/03/2011 11:09

## 2014-05-28 NOTE — Discharge Summary (Signed)
PATIENT NAME:  [name] [name], KOEBEL MR#:  [id number] DATE OF BIRTH:  08-13-1960  DATE OF ADMISSION:  07/25/2011 DATE OF DISCHARGE:  07/27/2011  ADMITTING PHYSICIAN: Dr. Cherre Huger DISCHARGING PHYSICIAN: Dr. Gladstone Lighter    PRIMARY CARE PHYSICIAN: None   CONSULTATIONS [place]: None.   DISCHARGE DIAGNOSES:  1. Chronic obstructive pulmonary disease exacerbation.  2. Ongoing smoking.  3. Depression.  4. Chronic back pain.   DISCHARGE MEDICATIONS:  1. Advair 250/50 1 puff b.i.d.  2. Aspirin 325 mg p.o. daily.   3. Spiriva HandiHaler 1 inhalation daily.  4. Singulair 10 mg p.o. daily.  5. Levaquin 500 mg p.o. daily for three more days.  6. Prednisone taper.  7. Nicotrol inhaler as directed.  8. Seroquel 25 mg p.o. at bedtime.  9. Albuterol inhaler 2 puffs every six hours p.r.n.   DISCHARGE DIET: Low sodium diet.   DISCHARGE ACTIVITY: As tolerated.     FOLLOWUP INSTRUCTIONS:  1. Follow up with primary care physician in two weeks.  2. Smoking cessation advised.  LABS AND IMAGING STUDIES: WBC 7.3, hemoglobin 13.9, hematocrit 42.6, platelet count 280, sodium 138, potassium 3.7, chloride 105, bicarbonate 29, BUN 9, creatinine 0.7, glucose 83, calcium 8.6. CK 107, CK-MB 3.4, troponin less than 0.02. Sputum culture is negative. Chest x-ray clear of infiltrates, no pneumonia or acute changes seen. Linear density on the posterior aspect of cardiac silhouette, could be fibrosis or chronic atelectasis.   BRIEF HOSPITAL COURSE: [name] [name] is a 54 year old African American female with past medical history significant for chronic obstructive pulmonary disease with ongoing smoking, history of hepatitis C, and depression who presented to [facility] secondary to productive cough and also dyspnea on exertion. Found to have chronic obstructive pulmonary disease along with some bronchitis. Recently discharged about a month ago when she was treated for chronic obstructive pulmonary disease  exacerbation and pneumonia.  Acute chronic obstructive pulmonary disease exacerbation: She was started on Solu-Medrol, nebulizer treatments, and oxygen support. Since she was doing better she is being weaned to p.o. prednisone and Singulair, Spiriva, Advair are being continued as an outpatient. She was also given a prescription for a nebulizer and albuterol liquid for that nebulizer.  She was recently treated for pneumonia and since she came in with productive cough, even though the chest x-ray was negative she was covered with Levaquin and will finish off the course by using another three days. She was motivated to quit smoking at least this admission and requested Nicotrol inhaler prescription, which I gave her. Other medication was Seroquel at home, which she will continue without any changes. Her course has been otherwise uneventful in that hospital. Her oxygen saturation was more than 90% on room air at rest and also on ambulation so she will not qualify for home oxygen at this time.   The patient was also given local primary care physicians' names and numbers so that she can pick one.  Since she is being discharged over the weekend, the patient said she would call them tomorrow and get the appointment.  DISCHARGE CONDITION: Stable.   DISCHARGE DISPOSITION: Home.   TIME SPENT ON DISCHARGE: 45 minutes.   ____________________________ Gladstone Lighter, MD rk:bjt D:  07/27/2011 11:06:00 ET          T: 07/27/2011 14:18:59 ET         JOB#: [id number]  cc: Gladstone Lighter, MD, <Dictator> Gladstone Lighter MD ELECTRONICALLY SIGNED 07/30/2011 15:25

## 2014-06-04 NOTE — Discharge Summary (Signed)
PATIENT NAME:  [name] [name], [name] [name] MR#:  [id number] DATE OF BIRTH:  09-04-1960  DATE OF ADMISSION:  05/10/2014 DATE OF DISCHARGE:  05/12/2014  ADMITTING PHYSICIAN:  Valentino Nose, MD  DISCHARGING PHYSICIAN:  Gladstone Lighter, MD   PRIMARY CARE PHYSICIAN: Nonlocal.   [place]:  None.   DISCHARGE DIAGNOSES: 1. Acute on chronic obstructive pulmonary disease exacerbation on 2 liters chronic home oxygen.  2. History of hepatitis C.  3. Depression and anxiety.  4. Ongoing smoking.   DISCHARGE HOME MEDICATIONS:  1. Aspirin 81 mg p.o. daily.  2. Singulair 10 mg p.o. daily.  3. Ativan 2 mg 3 times a day as needed for anxiety.  4. DuoNebs 3 mL q. 6 hours as needed for shortness of breath.  5. Albuterol inhaler 2 puffs 4 times a day as needed for shortness of breath.  6. Prednisone taper over 6 days.  7.   Azithromycin 500 mg p.o. daily for 2 more days.  8. Dulera 5 mcg/100 mcg 2 puffs twice a day.  9. Spiriva HandiHaler daily.   DISCHARGE OXYGEN: Two liters which is chronic.   DISCHARGE ACTIVITY: As tolerated.   DISCHARGE DIET: Regular diet.     FOLLOWUP INSTRUCTIONS:  PCP follow-up in 2 weeks or if symptoms worsen and avoid smoking.   LABORATORY AND IMAGING STUDIES PRIOR TO DISCHARGE:  1. White blood cells 5.7, hemoglobin 11.2, hematocrit 35.1, platelet count is 255,000.  2. Sodium 138, potassium 4.6, chloride 105, bicarbonate 27, BUN 13, creatinine 0.69,  glucose 175, and calcium of 8.3.  3. Blood cultures are negative.  4. Chest x-ray showing hyperinflation, linear atelectasis versus early infiltrate in right middle lobe. No pulmonary edema.  5. Troponins remain negative in the hospital.   [place] COURSE: [name] [name] is a 54 year old African American female with past medical history significant for chronic COPD on 2 liters home oxygen, ongoing smoking, who went to [place] and due to the weather, she developed respiratory distress,  came with worsening shortness of breath. Noted to be hypoxic, placed on BiPAP.  1. Acute on chronic COPD exacerbation.  Initially was hypoxic, placed on BiPAP. Improved significantly on IV steroids and nebulizers.  She is being discharged in stable condition. She is on 2 liters oxygen.  She is on prednisone taper, inhalers, and nebulizers are being continued.  She was strongly counseled about stopping her smoking.  2. Depression and anxiety. Not taking Seroquel anymore and she will continue her Ativan p.r.n. Her course has been otherwise uneventful in the hospital.   DISCHARGE CONDITION: Stable.   DISCHARGE DISPOSITION: Home.   TIME SPENT ON DISCHARGE: Is 40 minutes.    ____________________________ Gladstone Lighter, MD rk:tr D: 05/12/2014 12:01:30 ET T: 05/12/2014 13:18:59 ET JOB#: [id number]  cc: Gladstone Lighter, MD, <Dictator> Gladstone Lighter MD ELECTRONICALLY SIGNED 05/13/2014 11:02

## 2014-06-04 NOTE — H&P (Signed)
PATIENT NAME:  [name] [name], [name] [name] MR#:  [id number] DATE OF BIRTH:  11/03/1960  DATE OF ADMISSION:  05/10/2014  REFERRING PHYSICIAN: Brunilda Payor A. Edd Fabian, MD  PRIMARY CARE PHYSICIAN: Abby D. Bender MD   CHIEF COMPLAINT: Shortness of breath.   HISTORY OF PRESENT ILLNESS: A 54 year old African American female with history medical history of COPD with chronic respiratory failure treated with nasal cannula at baseline presenting with shortness of breath. Describes 1 day duration of shortness of breath with associated nonproductive cough. Denies any fevers, chills, chest pain. States that she did just return from [facility] and states, "lots of chlorine in the air," likely triggered some of her symptoms. Given her duration and severity she decided to present to the hospital for further workup and evaluation. She was noted to be saturating 82% upon arrival, promptly placed on BiPAP therapy with improvement of her symptoms. Denies any further symptoms at this time. Currently, is on BiPAP at FiO2 of 40%.   REVIEW OF SYSTEMS:  CONSTITUTIONAL: Denies fevers, chills, fatigue, weakness.  EYES: Blurred vision, double vision, or eye pain.  EARS, NOSE, THROAT: Denies tinnitus, hearing loss, ear pain.  RESPIRATORY: Positive for cough, wheezing, shortness of breath, as described above.  CARDIOVASCULAR: Denies chest pain, palpitations, edema.  GASTROINTESTINAL: Denies nausea, vomiting, diarrhea, or abdominal pain.  GENITOURINARY: Denies dysuria or hematuria  ENDOCRINE: Denies nocturia or thyroid problems.  HEMATOLOGIC AND LYMPHATIC: Denies easy bruising or bleeding.  SKIN: Denies rash or lesions.  MUSCULOSKELETAL: Denies pain in neck, back, shoulders, knees, hips, or arthritic symptoms.  NEUROLOGIC: Denies paralysis, paresthesias.  PSYCHIATRIC: Denies anxiety or depressive symptoms. Otherwise, a full review of systems performed by me is negative.   PAST MEDICAL HISTORY: Includes COPD with chronic  respiratory failure, 2 L nasal cannula at baseline; history of hepatitis C; breast cancer; depression, not otherwise specified.   SOCIAL HISTORY: Continued tobacco use, less than 1/2 pack daily. No alcohol or drug use.   FAMILY HISTORY: Positive for lung cancer.   ALLERGIES: PENICILLIN AND LATEX.   HOME MEDICATIONS: Include aspirin 81 mg p.o. daily, lorazepam 2 mg p.o. 3 times a day as needed for anxiety, Seroquel 25 mg p.o. daily, Advair 250/50 mcg inhalation 1 puff b.i.d., albuterol 90 mcg inhalation 4 times daily as needed for shortness of breath, DuoNeb treatments 4 times a day as needed for shortness of breath, Spiriva 18 mcg inhalation daily, Singulair 10 mg daily.   PHYSICAL EXAMINATION:  VITAL SIGNS: Temperature 98.1, heart rate 98, respirations 24, blood pressure 123/86, saturating 100% on BiPAP therapy FiO2 of 40%. Weight 54.4 kg, BMI 22.  GENERAL: A well-nourished, well-developed [place] American female with in minimal distress given respiratory status.  HEAD: Normocephalic, atraumatic.  EYES: Pupils equal, round, reactive to intact. Extraocular muscles intact. No scleral icterus.  MOUTH: Dry mucosal membrane. Dentition poor. No abscess noted.  EARS, NOSE, AND THROAT: Clear without exudates. No external lesions.  NECK: Supple. No thyromegaly. No nodules. No JVD.  PULMONARY: Greatly diminished breath sounds in all lung fields with poor respiratory effort requiring BiPAP therapy; however, no frank wheezes, rales, or rhonchi at this time. Respiratory effort poor, as stated above requiring BiPAP therapy.  CHEST: Nontender to palpation.  CARDIOVASCULAR: S1, S2, regular rate and rhythm. No murmurs, rubs, or gallops. No edema. Pedal pulses 2+ bilaterally.  GASTROINTESTINAL: Soft, nontender, nondistended. No masses. Positive bowel sounds. No hepatosplenomegaly.   MUSCULOSKELETAL: No swelling, clubbing, or edema. Range of motion is full in all extremities.  NEUROLOGIC:  Cranial nerves II-XII  intact. No gross focal neurological deficits. Sensation intact. Reflexes intact. SKIN: No ulcerations, lesions, rashes, or cyanosis. Skin warm and dry. Turgor intact.  PSYCHIATRIC: Mood and affect are within normal limits. The patient is awake, alert x 3. Insight and judgment intact.   LABORATORY DATA: Sodium 142, potassium 3.4, chloride 106, bicarbonate 29, BUN 12, creatinine 0.84, glucose of 96. LFTs: AST of 68, ALT of 86. Troponin less than 0.03. WBC 8.6, hemoglobin 12.1, platelets of 258,000.  Chest x-ray performed: Mild hyperinflation, linear atelectasis in the right middle lobe.  ASSESSMENT AND PLAN: A 54 year old African American female with history of chronic obstructive pulmonary disease with chronic respiratory failure, 2 L nasal cannula at baseline, presenting with shortness of breath.  1.  Acute on chronic respiratory failure with hypoxia secondary to chronic obstructive pulmonary disease exacerbation: Provide DuoNeb treatments q. 4 hours, Solu-Medrol 60 mg intravenous daily, supplemental oxygen to keep oxygen saturation greater than 88%. We will continue to wean BiPAP therapy will dose with azithromycin given cough. Continue home medications including Spiriva, Advair, and Singulair.  2.  Hypokalemia: Replace potassium, goal 4-5.  3.  Depression, not otherwise specified. Continue with quetiapine. 4.  Venous thromboembolism prophylaxis: Heparin subcutaneously.  CODE STATUS: The patient is a full code.  TIME SPENT: 45 minutes.    ____________________________ Aaron Mose. Hower, MD dkh:bm D: 05/10/2014 22:38:37 ET T: 05/10/2014 23:03:22 ET JOB#: [id number]  cc: Aaron Mose. Hower, MD, <Dictator> DAVID Woodfin Ganja MD ELECTRONICALLY SIGNED 05/11/2014 20:42

## 2014-07-15 ENCOUNTER — Emergency Department: Payer: Medicare Other

## 2014-07-15 ENCOUNTER — Inpatient Hospital Stay
Admission: EM | Admit: 2014-07-15 | Discharge: 2014-07-17 | DRG: 190 | Disposition: A | Discharge status: Home / Self Care | Payer: Medicare Other | Attending: Internal Medicine | Admitting: Internal Medicine

## 2014-07-15 ENCOUNTER — Encounter: Payer: Self-pay | Admitting: *Deleted

## 2014-07-15 DIAGNOSIS — F17200 Nicotine dependence, unspecified, uncomplicated: Secondary | ICD-10-CM | POA: Diagnosis present

## 2014-07-15 DIAGNOSIS — Z9981 Dependence on supplemental oxygen: Secondary | ICD-10-CM | POA: Diagnosis not present

## 2014-07-15 DIAGNOSIS — J9620 Acute and chronic respiratory failure, unspecified whether with hypoxia or hypercapnia: Secondary | ICD-10-CM

## 2014-07-15 DIAGNOSIS — Z853 Personal history of malignant neoplasm of breast: Secondary | ICD-10-CM | POA: Diagnosis not present

## 2014-07-15 DIAGNOSIS — J9621 Acute and chronic respiratory failure with hypoxia: Secondary | ICD-10-CM | POA: Diagnosis present

## 2014-07-15 DIAGNOSIS — J209 Acute bronchitis, unspecified: Secondary | ICD-10-CM | POA: Diagnosis present

## 2014-07-15 DIAGNOSIS — J440 Chronic obstructive pulmonary disease with acute lower respiratory infection: Secondary | ICD-10-CM | POA: Diagnosis present

## 2014-07-15 DIAGNOSIS — Z716 Tobacco abuse counseling: Secondary | ICD-10-CM | POA: Diagnosis present

## 2014-07-15 DIAGNOSIS — Z880 Allergy status to penicillin: Secondary | ICD-10-CM

## 2014-07-15 DIAGNOSIS — J449 Chronic obstructive pulmonary disease, unspecified: Secondary | ICD-10-CM | POA: Diagnosis present

## 2014-07-15 DIAGNOSIS — J9601 Acute respiratory failure with hypoxia: Secondary | ICD-10-CM

## 2014-07-15 DIAGNOSIS — R0602 Shortness of breath: Secondary | ICD-10-CM

## 2014-07-15 DIAGNOSIS — R000 Tachycardia, unspecified: Secondary | ICD-10-CM | POA: Diagnosis present

## 2014-07-15 DIAGNOSIS — J45901 Unspecified asthma with (acute) exacerbation: Secondary | ICD-10-CM

## 2014-07-15 DIAGNOSIS — J441 Chronic obstructive pulmonary disease with (acute) exacerbation: Principal | ICD-10-CM

## 2014-07-15 HISTORY — DX: Chronic obstructive pulmonary disease, unspecified: J44.9

## 2014-07-15 HISTORY — DX: Malignant (primary) neoplasm, unspecified: C80.1

## 2014-07-15 HISTORY — DX: Malignant neoplasm of unspecified site of unspecified female breast: C50.919

## 2014-07-15 LAB — CBC
HCT: 43.2 % (ref 35.0–47.0)
HEMOGLOBIN: 14.3 g/dL (ref 12.0–16.0)
MCH: 28.7 pg (ref 26.0–34.0)
MCHC: 33 g/dL (ref 32.0–36.0)
MCV: 86.9 fL (ref 80.0–100.0)
Platelets: 300 10*3/uL (ref 150–440)
RBC: 4.97 MIL/uL (ref 3.80–5.20)
RDW: 12.5 % (ref 11.5–14.5)
WBC: 9.9 10*3/uL (ref 3.6–11.0)

## 2014-07-15 LAB — BASIC METABOLIC PANEL
ANION GAP: 8 (ref 5–15)
BUN: 11 mg/dL (ref 6–20)
CHLORIDE: 98 mmol/L — AB (ref 101–111)
CO2: 29 mmol/L (ref 22–32)
Calcium: 9.2 mg/dL (ref 8.9–10.3)
Creatinine, Ser: 0.67 mg/dL (ref 0.44–1.00)
GFR calc non Af Amer: >60 mL/min (ref >60)
Glucose, Bld: 100 mg/dL — ABNORMAL HIGH (ref 65–99)
Potassium: 3.9 mmol/L (ref 3.5–5.1)
Sodium: 135 mmol/L (ref 135–145)

## 2014-07-15 LAB — TROPONIN I: Troponin I: <0.03 ng/mL (ref <0.031)

## 2014-07-15 LAB — MAGNESIUM: MAGNESIUM: 1.8 mg/dL (ref 1.7–2.4)

## 2014-07-15 MED ORDER — ALBUTEROL SULFATE (2.5 MG/3ML) 0.083% IN NEBU
INHALATION_SOLUTION | RESPIRATORY_TRACT | Status: AC
  Filled 2014-07-15: qty 3

## 2014-07-15 MED ORDER — QUETIAPINE FUMARATE 25 MG PO TABS
50.0000 mg | ORAL_TABLET | Freq: Every day | ORAL | Status: DC
  Administered 2014-07-15 – 2014-07-16 (×2): 50 mg via ORAL
  Filled 2014-07-15 (×2): qty 2

## 2014-07-15 MED ORDER — ALBUTEROL SULFATE HFA 108 (90 BASE) MCG/ACT IN AERS: 2.0000 | INHALATION_SPRAY | RESPIRATORY_TRACT | Status: DC | PRN

## 2014-07-15 MED ORDER — METHYLPREDNISOLONE SODIUM SUCC 125 MG IJ SOLR
INTRAMUSCULAR | Status: AC
  Filled 2014-07-15: qty 2

## 2014-07-15 MED ORDER — ALBUTEROL SULFATE (2.5 MG/3ML) 0.083% IN NEBU: 2.5000 mg | INHALATION_SOLUTION | RESPIRATORY_TRACT | Status: DC | PRN

## 2014-07-15 MED ORDER — IPRATROPIUM-ALBUTEROL 0.5-2.5 (3) MG/3ML IN SOLN
3.0000 mL | Freq: Once | RESPIRATORY_TRACT | Status: AC
  Administered 2014-07-15: 3 mL via RESPIRATORY_TRACT

## 2014-07-15 MED ORDER — METHYLPREDNISOLONE SODIUM SUCC 125 MG IJ SOLR
60.0000 mg | Freq: Four times a day (QID) | INTRAMUSCULAR | Status: DC
  Administered 2014-07-15 – 2014-07-16 (×3): 60 mg via INTRAVENOUS
  Filled 2014-07-15 (×2): qty 2

## 2014-07-15 MED ORDER — ACETAMINOPHEN 650 MG RE SUPP: 650.0000 mg | Freq: Four times a day (QID) | RECTAL | Status: DC | PRN

## 2014-07-15 MED ORDER — DEXTROSE 5 % IV SOLN
INTRAVENOUS | Status: AC
  Filled 2014-07-15: qty 500

## 2014-07-15 MED ORDER — ASPIRIN EC 81 MG PO TBEC
81.0000 mg | DELAYED_RELEASE_TABLET | Freq: Every day | ORAL | Status: DC
  Administered 2014-07-16: 07:00:00 81 mg via ORAL
  Filled 2014-07-15 (×2): qty 1

## 2014-07-15 MED ORDER — MONTELUKAST SODIUM 10 MG PO TABS
10.0000 mg | ORAL_TABLET | Freq: Every day | ORAL | Status: DC
  Administered 2014-07-16 – 2014-07-17 (×2): 10 mg via ORAL
  Filled 2014-07-15 (×2): qty 1

## 2014-07-15 MED ORDER — DOCUSATE SODIUM 100 MG PO CAPS
100.0000 mg | ORAL_CAPSULE | Freq: Two times a day (BID) | ORAL | Status: DC
  Administered 2014-07-15 – 2014-07-17 (×4): 100 mg via ORAL
  Filled 2014-07-15 (×4): qty 1

## 2014-07-15 MED ORDER — SODIUM CHLORIDE 0.9 % IV BOLUS (SEPSIS)
1000.0000 mL | INTRAVENOUS | Status: AC
Start: 2014-07-15 — End: 2014-07-15
  Administered 2014-07-15: 1000 mL via INTRAVENOUS

## 2014-07-15 MED ORDER — HYDROCODONE-ACETAMINOPHEN 5-325 MG PO TABS: 1.0000 | ORAL_TABLET | ORAL | Status: DC | PRN

## 2014-07-15 MED ORDER — ONDANSETRON HCL 4 MG PO TABS: 4.0000 mg | ORAL_TABLET | Freq: Four times a day (QID) | ORAL | Status: DC | PRN

## 2014-07-15 MED ORDER — MAGNESIUM SULFATE 2 GM/50ML IV SOLN
INTRAVENOUS | Status: AC
  Filled 2014-07-15: qty 50

## 2014-07-15 MED ORDER — MAGNESIUM SULFATE 2 GM/50ML IV SOLN
2.0000 g | Freq: Once | INTRAVENOUS | Status: AC
  Administered 2014-07-15: 2 g via INTRAVENOUS

## 2014-07-15 MED ORDER — ZOLPIDEM TARTRATE 5 MG PO TABS: 5.0000 mg | ORAL_TABLET | Freq: Every evening | ORAL | Status: DC | PRN

## 2014-07-15 MED ORDER — HEPARIN SODIUM (PORCINE) 5000 UNIT/ML IJ SOLN
5000.0000 [IU] | Freq: Three times a day (TID) | INTRAMUSCULAR | Status: DC
  Administered 2014-07-15 – 2014-07-17 (×5): 5000 [IU] via SUBCUTANEOUS
  Filled 2014-07-15 (×5): qty 1

## 2014-07-15 MED ORDER — IPRATROPIUM-ALBUTEROL 0.5-2.5 (3) MG/3ML IN SOLN
RESPIRATORY_TRACT | Status: AC
  Administered 2014-07-15: 3 mL via RESPIRATORY_TRACT
  Filled 2014-07-15: qty 3

## 2014-07-15 MED ORDER — IPRATROPIUM-ALBUTEROL 0.5-2.5 (3) MG/3ML IN SOLN
RESPIRATORY_TRACT | Status: AC
  Filled 2014-07-15: qty 3

## 2014-07-15 MED ORDER — ALBUTEROL SULFATE (2.5 MG/3ML) 0.083% IN NEBU
5.0000 mg/h | INHALATION_SOLUTION | Freq: Once | RESPIRATORY_TRACT | Status: AC
  Administered 2014-07-15: 5 mg/h via RESPIRATORY_TRACT

## 2014-07-15 MED ORDER — IPRATROPIUM-ALBUTEROL 0.5-2.5 (3) MG/3ML IN SOLN
3.0000 mL | RESPIRATORY_TRACT | Status: DC
  Administered 2014-07-15 – 2014-07-17 (×9): 3 mL via RESPIRATORY_TRACT
  Filled 2014-07-15 (×9): qty 3

## 2014-07-15 MED ORDER — DOXYCYCLINE HYCLATE 100 MG PO TABS
100.0000 mg | ORAL_TABLET | Freq: Two times a day (BID) | ORAL | Status: DC
Start: 2014-07-15 — End: 2014-07-16
  Administered 2014-07-15 – 2014-07-16 (×2): 100 mg via ORAL
  Filled 2014-07-15 (×2): qty 1

## 2014-07-15 MED ORDER — ACETAMINOPHEN 325 MG PO TABS
650.0000 mg | ORAL_TABLET | Freq: Four times a day (QID) | ORAL | Status: DC | PRN
Start: 2014-07-15 — End: 2014-07-17

## 2014-07-15 MED ORDER — MORPHINE SULFATE 4 MG/ML IJ SOLN
4.0000 mg | Freq: Once | INTRAMUSCULAR | Status: AC
  Administered 2014-07-15: 4 mg via INTRAVENOUS

## 2014-07-15 MED ORDER — MORPHINE SULFATE 4 MG/ML IJ SOLN
INTRAMUSCULAR | Status: AC
  Filled 2014-07-15: qty 1

## 2014-07-15 MED ORDER — FAMOTIDINE IN NACL 20-0.9 MG/50ML-% IV SOLN
20.0000 mg | Freq: Two times a day (BID) | INTRAVENOUS | Status: DC
  Administered 2014-07-15 – 2014-07-16 (×2): 20 mg via INTRAVENOUS
  Filled 2014-07-15 (×4): qty 50

## 2014-07-15 MED ORDER — POLYETHYLENE GLYCOL 3350 17 G PO PACK
17.0000 g | PACK | Freq: Every day | ORAL | Status: DC | PRN
  Administered 2014-07-16: 08:00:00 17 g via ORAL
  Filled 2014-07-15: qty 1

## 2014-07-15 MED ORDER — DEXTROSE 5 % IV SOLN
500.0000 mg | INTRAVENOUS | Status: DC
  Administered 2014-07-15 – 2014-07-16 (×2): 500 mg via INTRAVENOUS
  Filled 2014-07-15: qty 500

## 2014-07-15 MED ORDER — MOMETASONE FURO-FORMOTEROL FUM 200-5 MCG/ACT IN AERO
2.0000 | INHALATION_SPRAY | Freq: Two times a day (BID) | RESPIRATORY_TRACT | Status: DC
  Administered 2014-07-15 – 2014-07-16 (×3): 2 via RESPIRATORY_TRACT
  Filled 2014-07-15: qty 8.8

## 2014-07-15 MED ORDER — ONDANSETRON HCL 4 MG/2ML IJ SOLN
4.0000 mg | Freq: Four times a day (QID) | INTRAMUSCULAR | Status: DC | PRN
  Administered 2014-07-15: 4 mg via INTRAVENOUS
  Filled 2014-07-15: qty 2

## 2014-07-15 MED ORDER — SODIUM CHLORIDE 0.9 % IV SOLN
INTRAVENOUS | Status: DC
  Administered 2014-07-15: 23:00:00 via INTRAVENOUS

## 2014-07-15 MED ORDER — TIOTROPIUM BROMIDE MONOHYDRATE 18 MCG IN CAPS
18.0000 ug | ORAL_CAPSULE | Freq: Every day | RESPIRATORY_TRACT | Status: DC
  Administered 2014-07-16 – 2014-07-17 (×2): 18 ug via RESPIRATORY_TRACT
  Filled 2014-07-15: qty 5

## 2014-07-15 NOTE — ED Notes (Signed)
Pt with improved work of breathing and decreased chest pain

## 2014-07-15 NOTE — ED Provider Notes (Signed)
[facility] Emergency Department Provider Note  ____________________________________________  Time seen: Approximately 6:16 PM  I have reviewed the triage vital signs and the nursing notes.   HISTORY  Chief Complaint Shortness of Breath    HPI Lugenia Assefa Arbaugh is a 54 y.o. female with a history of severe COPD who uses 2 L of O2 nasal cannula at home intermittently who also has history of asthma and seasonal allergies presents with several days of worsening shortness of breath and increased work of breathing.  Also reports some moderate sharp central chestpain intermittently for the last 2-3 days which occurs both with exertion at rest.  She reports that her dyspnea is much worse with any amount of ambulation.  Her home DuoNebs have been helping a little but not much.   Past Medical History  Diagnosis Date  . COPD (chronic obstructive pulmonary disease)   . Cancer   . Breast cancer     There are no active problems to display for this patient.   History reviewed. No pertinent past surgical history.  No current outpatient prescriptions on file.  Allergies Penicillins  History reviewed. No pertinent family history.  Social History History  Substance Use Topics  . Smoking status: Current Some Day Smoker  . Smokeless tobacco: Not on file  . Alcohol Use: Not on file    Review of Systems Constitutional: No fever/chills Eyes: No visual changes. ENT: No sore throat. Cardiovascular: Sharp central chest pain intermittently as described above Respiratory: Severe shortness of breath worse with exertion Gastrointestinal: No abdominal pain.  No nausea, no vomiting.  No diarrhea.  No constipation. Genitourinary: Negative for dysuria. Musculoskeletal: Negative for back pain. Skin: Negative for rash. Neurological: Negative for headaches, focal weakness or numbness.  10-point ROS otherwise  negative.  ____________________________________________   PHYSICAL EXAM:  VITAL SIGNS: ED Triage Vitals  Enc Vitals Group     BP 07/15/14 1646 136/92 mmHg     Pulse Rate 07/15/14 1642 115     Resp 07/15/14 1642 24     Temp 07/15/14 1642 98.2 F (36.8 C)     Temp Source 07/15/14 1642 Oral     SpO2 07/15/14 1640 96 %     Weight 07/15/14 1642 120 lb (54.432 kg)     Height 07/15/14 1642 5\' 2"  (1.575 m)     Head Cir --      Peak Flow --      Pain Score 07/15/14 1644 5     Pain Loc --      Pain Edu? --      Excl. in [place]? --     Constitutional: Alert and oriented.  Has the appearance of chronic emphysema.  Increased work of breathing at this time Eyes: Conjunctivae are normal. PERRL. EOMI. Head: Atraumatic. Nose: No congestion/rhinnorhea. Mouth/Throat: Mucous membranes are moist.  Oropharynx non-erythematous. Neck: No stridor.   Cardiovascular: Mild tachycardia, regular rhythm. Grossly normal heart sounds.  Good peripheral circulation. Respiratory: Increased respiratory effort with accessory muscle usage.  Diminished lung sounds throughout.  No audible wheezing at this time.. Gastrointestinal: Soft and nontender. No distention. No abdominal bruits. No CVA tenderness. Musculoskeletal: No lower extremity tenderness nor edema.  No joint effusions. Neurologic:  Normal speech and language. No gross focal neurologic deficits are appreciated. Speech is normal. No gait instability. Skin:  Skin is warm, dry and intact. No rash noted. Psychiatric: Mood and affect are normal. Speech and behavior are normal.  ____________________________________________   LABS (all labs ordered  are listed, but only abnormal results are displayed)  Labs Reviewed  BASIC METABOLIC PANEL - Abnormal; Notable for the following:    Chloride 98 (*)    Glucose, Bld 100 (*)    All other components within normal limits  CBC  TROPONIN I  MAGNESIUM   ____________________________________________  EKG  ED ECG  REPORT I, Damiean Lukes, the attending physician, personally viewed and interpreted this ECG.  Date: 07/15/2014 EKG Time: 16:49 Rate: 114 Rhythm: Sinus tachycardia QRS Axis: normal Intervals: normal ST/T Wave abnormalities: normal Conduction Disutrbances: none Narrative Interpretation: unremarkable  ____________________________________________  RADIOLOGY  Dg Chest Port 1 View  07/15/2014   CLINICAL DATA:  Shortness of breath. History of COPD and breast cancer.  EXAM: PORTABLE CHEST - 1 VIEW  COMPARISON:  None.  FINDINGS: The cardiomediastinal silhouette is unremarkable.  COPD/emphysema again identified.  Subsegmental scarring in the mid right lung noted.  There is no evidence of focal airspace disease, pulmonary edema, suspicious pulmonary nodule/mass, pleural effusion, or pneumothorax. No acute bony abnormalities are identified.  IMPRESSION: No evidence of acute cardiopulmonary disease.  COPD/ emphysema.   Electronically Signed   By: Margarette Canada M.D.   On: 07/15/2014 18:05    ____________________________________________   PROCEDURES  Procedure(s) performed: None  Critical Care performed: Yes, see critical care note(s)   CRITICAL CARE Performed by: Hinda Kehr   Total critical care time: 30 minutes  Critical care time was exclusive of separately billable procedures and treating other patients.  Critical care was necessary to treat or prevent imminent or life-threatening deterioration.  Critical care was time spent personally by me on the following activities: development of treatment plan with patient and/or surrogate as well as nursing, discussions with consultants, evaluation of patient's response to treatment, examination of patient, obtaining history from patient or surrogate, ordering and performing treatments and interventions, ordering and review of laboratory studies, ordering and review of radiographic studies, pulse oximetry and re-evaluation of patient's  condition.  ____________________________________________   INITIAL IMPRESSION / ASSESSMENT AND PLAN / ED COURSE  Pertinent labs & imaging results that were available during my care of the patient were reviewed by me and considered in my medical decision making (see chart for details).  6:21 PM The patient is suffering from what appears to be a severe acute COPD exacerbation.  He is received a total of 5 DuoNeb's (2 at home and 2 via EMS).  The patient also received Solu-Medrol 125 mg IV by EMS.  He is currently on 2 L of oxygen nasal cannula and satting in the upper 90s.  She states that her work of breathing has improved since getting here and receiving her most recent DuoNeb but she still feels short of breath and is using accessory muscles.  I am going to test her oxygenation with ambulation but I suspect she would benefit from admission for every 2 hours nebulizer treatments and close monitoring for her COPD exacerbation.  Her initial troponin is negative and I do not believe that a pulmonary embolism is more likely than a severe exacerbation of her lung disease.  ----------------------------------------- 7:21 PM on 07/15/2014 -----------------------------------------  The patient dropped into the 70s when ambulating without oxygen.  On 2 L of oxygen she still desatted to 87% with minimal movement.  She continues to use her accessory muscles.  I believe that she would benefit from inpatient admission.  I am ordering an additional 5 mg/h nebulizer treatment of albuterol.  I will speak with the hospitalist about  admission.  CRITICAL CARE Performed by: Hinda Kehr   Total critical care time: 45 minutes  Critical care time was exclusive of separately billable procedures and treating other patients.  Critical care was necessary to treat or prevent imminent or life-threatening deterioration.  Critical care was time spent personally by me on the following activities: development of  treatment plan with patient and/or surrogate as well as nursing, discussions with consultants, evaluation of patient's response to treatment, examination of patient, obtaining history from patient or surrogate, ordering and performing treatments and interventions, ordering and review of laboratory studies, ordering and review of radiographic studies, pulse oximetry and re-evaluation of patient's condition.  ____________________________________________  FINAL CLINICAL IMPRESSION(S) / ED DIAGNOSES  Final diagnoses:  Acute respiratory failure with hypoxemia  Acute exacerbation of COPD with asthma      NEW MEDICATIONS STARTED DURING THIS VISIT:  New Prescriptions   No medications on file     Hinda Kehr, MD 07/15/14 1932

## 2014-07-15 NOTE — ED Notes (Signed)
Meal tray provided.

## 2014-07-15 NOTE — ED Notes (Addendum)
Pt ambulated to the bathroom, pt 79% on RA and 87% on 2L [place] during ambulation

## 2014-07-15 NOTE — ED Notes (Signed)
Pt complains of chest pain and difficulty breathing. md in to assess. Pt with wheezing noted.

## 2014-07-15 NOTE — H&P (Signed)
History and Physical    BRITTYN SALAZ XAJ:[id number] DOB: 04-16-1960 DOA: 07/15/2014  Referring physician: Dr. Karma Greaser PCP: Letta Median, MD  Specialists: none  Chief Complaint: SOB  HPI: EMMALEAH MERONEY is a 54 y.o. female has a past medical history significant for COPD and breast cancer. Has O2 at home to use as needed. Smoked up until 5 days ago. Presents with severe worsening of SOB and cough. More hypoxic in ER despite O2, SVN's, and steroids. Now admitted.  Review of Systems: The patient denies anorexia, fever, weight loss,, vision loss, decreased hearing, hoarseness, chest pain, syncope, peripheral edema, balance deficits, hemoptysis, abdominal pain, melena, hematochezia, severe indigestion/heartburn, hematuria, incontinence, genital sores, muscle weakness, suspicious skin lesions, transient blindness, difficulty walking, depression, unusual weight change, abnormal bleeding, enlarged lymph nodes, angioedema, and breast masses.   Past Medical History  Diagnosis Date  . COPD (chronic obstructive pulmonary disease)   . Cancer   . Breast cancer    History reviewed. No pertinent past surgical history. Social History:  reports that she has been smoking.  She does not have any smokeless tobacco history on file. Her alcohol and drug histories are not on file.  Allergies  Allergen Reactions  . Penicillins Rash    History reviewed. No pertinent family history.  Prior to Admission medications   Not on File   Physical Exam: Filed Vitals:   07/15/14 1800 07/15/14 1830 07/15/14 1830 07/15/14 1900  BP: 127/80 124/93  128/81  Pulse: 109 116  106  Temp:      TempSrc:      Resp: 32 27  28  Height:      Weight:      SpO2: 99% 90% 87% 97%     General:  Moderate respiratory distress  Eyes: PERRL, EOMI, no scleral icterus  ENT: dry oropharynx  Neck: supple, no lymphadenopathy  Cardiovascular: rapid rate with regular rhythm without MRG; 2+ peripheral  pulses, no JVD, no peripheral edema  Respiratory: decreased breath sounds with scattered rhonchi. No wheezes  Abdomen: soft, non tender to palpation, positive bowel sounds, no guarding, no rebound  Skin: no rashes  Musculoskeletal: normal bulk and tone, no joint swelling  Psychiatric: normal mood and affect  Neurologic: CN 2-12 grossly intact, MS 5/5 in all 4  Labs on Admission:  Basic Metabolic Panel:  Recent Labs Lab 07/15/14 1652  NA 135  K 3.9  CL 98*  CO2 29  GLUCOSE 100*  BUN 11  CREATININE 0.67  CALCIUM 9.2  MG 1.8   Liver Function Tests: No results for input(s): AST, ALT, ALKPHOS, BILITOT, PROT, ALBUMIN in the last 168 hours. No results for input(s): LIPASE, AMYLASE in the last 168 hours. No results for input(s): AMMONIA in the last 168 hours. CBC:  Recent Labs Lab 07/15/14 1652  WBC 9.9  HGB 14.3  HCT 43.2  MCV 86.9  PLT 300   Cardiac Enzymes:  Recent Labs Lab 07/15/14 1652  TROPONINI <0.03    BNP (last 3 results) No results for input(s): BNP in the last 8760 hours.  ProBNP (last 3 results) No results for input(s): PROBNP in the last 8760 hours.  CBG: No results for input(s): GLUCAP in the last 168 hours.  Radiological Exams on Admission: Dg Chest Port 1 View  07/15/2014   CLINICAL DATA:  Shortness of breath. History of COPD and breast cancer.  EXAM: PORTABLE CHEST - 1 VIEW  COMPARISON:  None.  FINDINGS: The cardiomediastinal silhouette is unremarkable.  COPD/emphysema again identified.  Subsegmental scarring in the mid right lung noted.  There is no evidence of focal airspace disease, pulmonary edema, suspicious pulmonary nodule/mass, pleural effusion, or pneumothorax. No acute bony abnormalities are identified.  IMPRESSION: No evidence of acute cardiopulmonary disease.  COPD/ emphysema.   Electronically Signed   By: Margarette Canada M.D.   On: 07/15/2014 18:05    EKG: Independently reviewed.  Assessment/Plan Principal Problem:   Acute on  chronic respiratory failure Active Problems:   COPD exacerbation   Acute bronchitis   Tachycardia   Will admit to floor with O2, IV steroids, IV ABX, and SVN's. Wean O2 as tolerated. Repeat CXR and labs in AM. Consider Pulmonary consult if no improvement.  Diet: regular Fluids: ns@75  DVT Prophylaxis: SQ Heparin  Code Status: FULL  Family Communication: yes  Disposition Plan: home  Time spent: 50 min

## 2014-07-15 NOTE — ED Notes (Signed)
Pt arrives via EMS with complaints of SOB, EMS states tripoding and diminished lung sounds upon arrival, EMS gave 2 duonebs and 125mg  IV solumederol in route, pt has hx of COPD, pt on 2L [place] at home

## 2014-07-16 ENCOUNTER — Inpatient Hospital Stay: Payer: Medicare Other

## 2014-07-16 LAB — CBC
HCT: 38.4 % (ref 35.0–47.0)
Hemoglobin: 12.8 g/dL (ref 12.0–16.0)
MCH: 29.1 pg (ref 26.0–34.0)
MCHC: 33.3 g/dL (ref 32.0–36.0)
MCV: 87.5 fL (ref 80.0–100.0)
Platelets: 251 10*3/uL (ref 150–440)
RBC: 4.39 MIL/uL (ref 3.80–5.20)
RDW: 12.1 % (ref 11.5–14.5)
WBC: 6.8 10*3/uL (ref 3.6–11.0)

## 2014-07-16 LAB — BASIC METABOLIC PANEL
ANION GAP: 5 (ref 5–15)
BUN: 11 mg/dL (ref 6–20)
CO2: 27 mmol/L (ref 22–32)
CREATININE: 0.69 mg/dL (ref 0.44–1.00)
Calcium: 8.5 mg/dL — ABNORMAL LOW (ref 8.9–10.3)
Chloride: 104 mmol/L (ref 101–111)
Glucose, Bld: 210 mg/dL — ABNORMAL HIGH (ref 65–99)
Potassium: 4.1 mmol/L (ref 3.5–5.1)
SODIUM: 136 mmol/L (ref 135–145)

## 2014-07-16 MED ORDER — METHYLPREDNISOLONE SODIUM SUCC 125 MG IJ SOLR
60.0000 mg | Freq: Two times a day (BID) | INTRAMUSCULAR | Status: DC
  Administered 2014-07-16: 60 mg via INTRAVENOUS
  Filled 2014-07-16: qty 2

## 2014-07-16 MED ORDER — SODIUM CHLORIDE 0.9 % IJ SOLN
3.0000 mL | INTRAMUSCULAR | Status: DC | PRN
Start: 2014-07-16 — End: 2014-07-17

## 2014-07-16 MED ORDER — SODIUM CHLORIDE 0.9 % IJ SOLN
3.0000 mL | Freq: Three times a day (TID) | INTRAMUSCULAR | Status: DC
  Administered 2014-07-16 – 2014-07-17 (×3): 3 mL via INTRAVENOUS

## 2014-07-16 NOTE — Plan of Care (Signed)
Problem: Discharge Progression Outcomes Goal: Discharge plan in place and appropriate Individualization of care - Likes to be called Martie. - Lives at home alone. - On moderate fall precautions per policy. - Has history of COPD, controlled by medications. - Uses 2L of 02 at home.    Goal: Other Discharge Outcomes/Goals Outcome: Progressing Plan of care progress to goal for: Acute respiratory failure - Continues on IV solumedrol. - On 2L of 02 per [place]. - Continues ABX. - Continues on breathing tx Will continue to monitor.

## 2014-07-16 NOTE — Plan of Care (Signed)
Problem: Discharge Progression Outcomes Goal: Other Discharge Outcomes/Goals Outcome: Progressing Progress to goals: 1. Rested quietly with pt sleeping at long intervals this a.m. Reports feeling less SOB and able to get up to BR without assistance. 2. Last pulse ox 99% on 02 @ 2l/[place]-wears 02 at home at 2l/[place]. 3. Will continue to need home 02 4. Performing ADL's with minimal assistance. 5. No barriers identified. 6. Plans to return home on discharge. 7. Denies pain; no pain meds requested. 8. Tolerating diet-good appetite. 9. VSS and labs stable-no interventions needed.

## 2014-07-16 NOTE — Progress Notes (Signed)
[place] [facility] [place] NAME: [name] [name]    MR#:  [id number]  DATE OF BIRTH:  January 13, 1961  SUBJECTIVE:  Feels better with her breathing today  REVIEW OF SYSTEMS:    Review of Systems  Constitutional: Negative for fever, chills and diaphoresis.  HENT: Negative for congestion, ear pain, hearing loss, nosebleeds and sore throat.   Eyes: Negative for blurred vision, double vision, photophobia and pain.  Respiratory: Positive for cough and shortness of breath. Negative for hemoptysis, sputum production, wheezing and stridor.   Cardiovascular: Negative for orthopnea, claudication and leg swelling.  Gastrointestinal: Negative for heartburn and abdominal pain.  Genitourinary: Negative for dysuria and frequency.  Musculoskeletal: Negative for back pain, joint pain and neck pain.  Skin: Negative for rash.  Neurological: Positive for weakness. Negative for tingling, sensory change, speech change, focal weakness, seizures and headaches.  Endo/Heme/Allergies: Does not bruise/bleed easily.  Psychiatric/Behavioral: Negative for suicidal ideas, memory loss and substance abuse. The patient is not nervous/anxious.   All other systems reviewed and are negative.   DRUG ALLERGIES:   Allergies  Allergen Reactions  . Penicillins Rash    VITALS:  Blood pressure 109/68, pulse 111, temperature 98.1 F (36.7 C), temperature source Oral, resp. rate 24, height 5\' 2"  (1.575 m), weight 54.749 kg (120 lb 11.2 oz), SpO2 99 %.  PHYSICAL EXAMINATION:   Physical Exam  GENERAL:  54 y.o.-year-old patient lying in the bed with no acute distress.  EYES: Pupils equal, round, reactive to light and accommodation. No scleral icterus. Extraocular muscles intact.  HEENT: Head atraumatic, normocephalic. Oropharynx and nasopharynx clear.  NECK:  Supple, no jugular venous distention. No thyroid enlargement, no tenderness.  LUNGS: Normal breath sounds bilaterally,  few wheezing, rales, rhonchi. No use of accessory muscles of respiration.  CARDIOVASCULAR: S1, S2 normal. No murmurs, rubs, or gallops.  ABDOMEN: Soft, nontender, nondistended. Bowel sounds present. No organomegaly or mass.  EXTREMITIES: No cyanosis, clubbing or edema b/l.    NEUROLOGIC: Cranial nerves II through XII are intact. No focal Motor or sensory deficits b/l.   PSYCHIATRIC: The patient is alert and oriented x 3.  SKIN: No obvious rash, lesion, or ulcer.    LABORATORY PANEL:   CBC  Recent Labs Lab 07/16/14 0452  WBC 6.8  HGB 12.8  HCT 38.4  PLT 251   ------------------------------------------------------------------------------------------------------------------  Chemistries   Recent Labs Lab 07/15/14 1652 07/16/14 0452  NA 135 136  K 3.9 4.1  CL 98* 104  CO2 29 27  GLUCOSE 100* 210*  BUN 11 11  CREATININE 0.67 0.69  CALCIUM 9.2 8.5*  MG 1.8  --    ------------------------------------------------------------------------------------------------------------------  Cardiac Enzymes  Recent Labs Lab 07/15/14 1652  TROPONINI <0.03   ------------------------------------------------------------------------------------------------------------------  RADIOLOGY:  X-ray Chest Pa And Lateral  07/16/2014   CLINICAL DATA:  Short of breath.  COPD.  Breast cancer.  EXAM: CHEST  2 VIEW  COMPARISON:  1 day prior  FINDINGS: Hyperinflation. Lateral view degraded by patient arm position. Midline trachea. Normal heart size and mediastinal contours. No pleural effusion or pneumothorax. Pulmonary interstitial thickening with basilar predominant subsegmental atelectasis and probable scarring. No lobar consolidation.  IMPRESSION: COPD/chronic bronchitis with bibasilar atelectasis and/or scarring.  No acute superimposed process.   Electronically Signed   By: Abigail Miyamoto M.D.   On: 07/16/2014 09:06   Dg Chest Port 1 View  07/15/2014   CLINICAL DATA:  Shortness of breath. History  of COPD and breast  cancer.  EXAM: PORTABLE CHEST - 1 VIEW  COMPARISON:  None.  FINDINGS: The cardiomediastinal silhouette is unremarkable.  COPD/emphysema again identified.  Subsegmental scarring in the mid right lung noted.  There is no evidence of focal airspace disease, pulmonary edema, suspicious pulmonary nodule/mass, pleural effusion, or pneumothorax. No acute bony abnormalities are identified.  IMPRESSION: No evidence of acute cardiopulmonary disease.  COPD/ emphysema.   Electronically Signed   By: Margarette Canada M.D.   On: 07/15/2014 18:05     ASSESSMENT AND PLAN:   54 y.o. female has a past medical history significant for COPD and breast cancer. Has O2 at home to use as needed. Smoked up until 5 days ago. Presents with severe worsening of SOB and cough  1.COPD exacerbation, acute on chronic IV steroids, po zithromax, oxygen, nebs, inhlaers  2 Acute bronchitis po zithromax  3. Tachycardia resolved  4. Smoking cessation counselled for 3 mins Pt voiced understading   Management plans discussed with the patient, family and they are in agreement.  CODE STATUS: Full  DVT Prophylaxis: heparin  TOTAL TIME TAKING CARE OF THIS PATIENT:35 minutes.   POSSIBLE D/C IN  1 DAYS, DEPENDING ON CLINICAL CONDITION.   Jhamari Markowicz M.D on 07/16/2014 at 1:19 PM  Between 7am to 6pm - Pager - [phone]  After 6pm go to [email or web address] - password [email or web address] [facility]  Office  [phone]  CC: Primary care physician; Letta Median, MD

## 2014-07-17 MED ORDER — PREDNISONE 50 MG PO TABS
60.0000 mg | ORAL_TABLET | Freq: Every day | ORAL | Status: DC
  Administered 2014-07-17: 10:00:00 60 mg via ORAL
  Filled 2014-07-17: qty 1

## 2014-07-17 MED ORDER — AZITHROMYCIN 250 MG PO TABS: ORAL_TABLET | ORAL | Status: DC

## 2014-07-17 MED ORDER — PREDNISONE 20 MG PO TABS: 60.0000 mg | ORAL_TABLET | Freq: Every day | ORAL | Status: DC

## 2014-07-17 MED ORDER — BUDESONIDE-FORMOTEROL FUMARATE 160-4.5 MCG/ACT IN AERO: 2.0000 | INHALATION_SPRAY | Freq: Two times a day (BID) | RESPIRATORY_TRACT | Status: DC

## 2014-07-17 MED ORDER — AZITHROMYCIN 250 MG PO TABS
250.0000 mg | ORAL_TABLET | Freq: Every day | ORAL | Status: DC
  Administered 2014-07-17: 250 mg via ORAL
  Filled 2014-07-17: qty 1

## 2014-07-17 MED ORDER — BUDESONIDE-FORMOTEROL FUMARATE 160-4.5 MCG/ACT IN AERO
2.0000 | INHALATION_SPRAY | Freq: Two times a day (BID) | RESPIRATORY_TRACT | Status: DC
  Administered 2014-07-17: 2 via RESPIRATORY_TRACT
  Filled 2014-07-17: qty 6

## 2014-07-17 NOTE — Discharge Summary (Signed)
[place] at [place] NAME: [name] [name]    MR#:  [id number]  DATE OF BIRTH:  May 18, 1960  DATE OF ADMISSION:  07/15/2014 ADMITTING PHYSICIAN: Idelle Crouch, MD  DATE OF DISCHARGE: 07/17/2014  PRIMARY CARE PHYSICIAN: Letta Median, MD    ADMISSION DIAGNOSIS:  Acute exacerbation of COPD with asthma [J44.9] Acute respiratory failure with hypoxemia [J96.01]  DISCHARGE DIAGNOSIS:  Acute exacerbation of COPD flare Acute bronchitis  SECONDARY DIAGNOSIS:   Past Medical History  Diagnosis Date  . COPD (chronic obstructive pulmonary disease)   . Cancer   . Breast cancer     HOSPITAL COURSE:   54 y.o. female has a past medical history significant for COPD and breast cancer. Has O2 at home to use as needed. Smoked up until 5 days ago. Presented with severe worsening of SOB and cough...  1.COPD exacerbation, acute on chronic - pt was on IV steroids---.switched to oral steroid taper - po zithromax, oxygen, nebs, inhalers  2 Acute bronchitis po zithromax  3. Tachycardia resolved  4. Smoking cessation counselled for 3 mins Pt voiced understanding  Pt's rest of the medical problems remained stable and will cont her home meds. DISCHARGE CONDITIONS:   fair  CONSULTS OBTAINED:   none DRUG ALLERGIES:   Allergies  Allergen Reactions  . Penicillins Rash    DISCHARGE MEDICATIONS:   Current Discharge Medication List    START taking these medications   Details  azithromycin (ZITHROMAX) 250 MG tablet Take as instructed Qty: 4 each, Refills: 0    budesonide-formoterol (SYMBICORT) 160-4.5 MCG/ACT inhaler Inhale 2 puffs into the lungs 2 (two) times daily. Qty: 1 Inhaler, Refills: 12    predniSONE (DELTASONE) 20 MG tablet Take 3 tablets (60 mg total) by mouth daily with breakfast. Qty: 15 tablet, Refills: 0      CONTINUE these medications which have NOT CHANGED   Details  ATROVENT HFA 17 MCG/ACT inhaler  Inhale 2 puffs into the lungs 4 (four) times daily. Refills: 5    montelukast (SINGULAIR) 10 MG tablet Take 10 mg by mouth daily. Refills: 5    PROAIR HFA 108 (90 BASE) MCG/ACT inhaler Inhale 2 puffs into the lungs every 6 (six) hours as needed. Refills: 5    QUEtiapine (SEROQUEL) 50 MG tablet Take 50 mg by mouth at bedtime.    SPIRIVA HANDIHALER 18 MCG inhalation capsule Place 18 mcg into inhaler and inhale daily. Refills: 5      STOP taking these medications     Fluticasone-Salmeterol (ADVAIR) 500-50 MCG/DOSE AEPB         DISCHARGE INSTRUCTIONS:   Stop smoking!  If you experience worsening of your admission symptoms, develop shortness of breath, life threatening emergency, suicidal or homicidal thoughts you must seek medical attention immediately by calling 911 or calling your MD immediately  if symptoms less severe.  You Must read complete instructions/literature along with all the possible adverse reactions/side effects for all the Medicines you take and that have been prescribed to you. Take any new Medicines after you have completely understood and accept all the possible adverse reactions/side effects.   Please note You were cared for by a hospitalist during your hospital stay. If you have any questions about your discharge medications or the care you received while you were in the hospital after you are discharged, you can call the unit and asked to speak with the hospitalist on call if the hospitalist that took care of you  is not available. Once you are discharged, your primary care physician will handle any further medical issues. Please note that NO REFILLS for any discharge medications will be authorized once you are discharged, as it is imperative that you return to your primary care physician (or establish a relationship with a primary care physician if you do not have one) for your aftercare needs so that they can reassess your need for medications and monitor your lab  values. Today   SUBJECTIVE   mild sob  VITAL SIGNS:  Blood pressure 111/63, pulse 74, temperature 97.9 F (36.6 C), temperature source Oral, resp. rate 20, height 5\' 2"  (1.575 m), weight 56.881 kg (125 lb 6.4 oz), SpO2 100 %.  I/O:   Intake/Output Summary (Last 24 hours) at 07/17/14 0754 Last data filed at 07/16/14 1800  Gross per 24 hour  Intake   1165 ml  Output    350 ml  Net    815 ml    PHYSICAL EXAMINATION:  GENERAL:  54 y.o.-year-old patient lying in the bed with no acute distress.  EYES: Pupils equal, round, reactive to light and accommodation. No scleral icterus. Extraocular muscles intact.  HEENT: Head atraumatic, normocephalic. Oropharynx and nasopharynx clear.  NECK:  Supple, no jugular venous distention. No thyroid enlargement, no tenderness.  LUNGS: Normal breath sounds bilaterally, no wheezing or rales, rhonchi or crepitation. No use of accessory muscles of respiration.  CARDIOVASCULAR: S1, S2 normal. No murmurs, rubs, or gallops.  ABDOMEN: Soft, non-tender, non-distended. Bowel sounds present. No organomegaly or mass.  EXTREMITIES: No pedal edema, cyanosis, or clubbing.  NEUROLOGIC: Cranial nerves II through XII are intact. Muscle strength 5/5 in all extremities. Sensation intact. Gait not checked.  PSYCHIATRIC: patient is alert and oriented x 3.  SKIN: No obvious rash, lesion, or ulcer.   DATA REVIEW:  CBC   Recent Labs Lab 07/16/14 0452  WBC 6.8  HGB 12.8  HCT 38.4  PLT 251   Chemistries   Recent Labs Lab 07/15/14 1652 07/16/14 0452  NA 135 136  K 3.9 4.1  CL 98* 104  CO2 29 27  GLUCOSE 100* 210*  BUN 11 11  CREATININE 0.67 0.69  CALCIUM 9.2 8.5*  MG 1.8  --    RADIOLOGY:  X-ray Chest Pa And Lateral  07/16/2014   CLINICAL DATA:  Short of breath.  COPD.  Breast cancer.  EXAM: CHEST  2 VIEW  COMPARISON:  1 day prior  FINDINGS: Hyperinflation. Lateral view degraded by patient arm position. Midline trachea. Normal heart size and mediastinal  contours. No pleural effusion or pneumothorax. Pulmonary interstitial thickening with basilar predominant subsegmental atelectasis and probable scarring. No lobar consolidation.  IMPRESSION: COPD/chronic bronchitis with bibasilar atelectasis and/or scarring.  No acute superimposed process.   Electronically Signed   By: Abigail Miyamoto M.D.   On: 07/16/2014 09:06   Dg Chest Port 1 View  07/15/2014   CLINICAL DATA:  Shortness of breath. History of COPD and breast cancer.  EXAM: PORTABLE CHEST - 1 VIEW  COMPARISON:  None.  FINDINGS: The cardiomediastinal silhouette is unremarkable.  COPD/emphysema again identified.  Subsegmental scarring in the mid right lung noted.  There is no evidence of focal airspace disease, pulmonary edema, suspicious pulmonary nodule/mass, pleural effusion, or pneumothorax. No acute bony abnormalities are identified.  IMPRESSION: No evidence of acute cardiopulmonary disease.  COPD/ emphysema.   Electronically Signed   By: Margarette Canada M.D.   On: 07/15/2014 18:05   Management plans discussed with the  patient and  in agreement.  CODE STATUS:     Code Status Orders        Start     Ordered   07/15/14 2125  Full code   Continuous     07/15/14 2124    TOTAL TIME TAKING CARE OF THIS PATIENT: 33 mins   Makeisha Jentsch M.D on 07/17/2014 at 7:54 AM  Between 7am to 6pm - Pager - [phone] After 6pm go to [email or web address] - password [email or web address] [facility]  Office  [phone]  CC: Primary care physician; Letta Median, MD

## 2014-07-17 NOTE — Plan of Care (Signed)
Problem: Discharge Progression Outcomes Goal: Other Discharge Outcomes/Goals Plan of care progress to goals: 1. Rested quietly  2. Last pulse ox 99% on 02 @ 2l/[place]-wears 02 at home at 2l/[place]. 3. Will continue to need home 02 4. Performing ADL's with minimal assistance. 5. No barriers identified. 6. Plans to return home on discharge. 7. Denies pain; no pain meds requested. 8. Tolerating diet-good appetite. 9. VSS and labs stable-no interventions needed.

## 2014-07-17 NOTE — Care Management (Signed)
Admitted to [facility] with the diagnosis of acute on chronic respiratory failure. Lives alone. Daughter is Savanna Dooley [phone]). Home Health in the past. Doesn't remember name of agency. No skilled facility. Home oxygen thru [facility] for many years. Goes to [employer]. States "hadn't been there in awhile." A friend will transport. Discharge to home today per Dr. Fritzi Mandes.  Shelbie Ammons RN MSN Care Management [phone]

## 2014-07-17 NOTE — Discharge Instructions (Signed)
STOP SMOKING!!!!!! 

## 2014-07-17 NOTE — Progress Notes (Signed)
Reviewed dc instruction with patient.  Pt verbalized understanding of dischrage instruction.  IV discontinued.

## 2014-12-08 ENCOUNTER — Observation Stay
Admission: EM | Admit: 2014-12-08 | Discharge: 2014-12-10 | Disposition: A | Discharge status: Home / Self Care | Payer: Medicare Other | Attending: Internal Medicine | Admitting: Internal Medicine

## 2014-12-08 ENCOUNTER — Encounter: Payer: Self-pay | Admitting: *Deleted

## 2014-12-08 DIAGNOSIS — Z923 Personal history of irradiation: Secondary | ICD-10-CM | POA: Insufficient documentation

## 2014-12-08 DIAGNOSIS — Z9114 Patient's other noncompliance with medication regimen: Secondary | ICD-10-CM | POA: Diagnosis not present

## 2014-12-08 DIAGNOSIS — Z853 Personal history of malignant neoplasm of breast: Secondary | ICD-10-CM | POA: Diagnosis not present

## 2014-12-08 DIAGNOSIS — Z90710 Acquired absence of both cervix and uterus: Secondary | ICD-10-CM | POA: Insufficient documentation

## 2014-12-08 DIAGNOSIS — R05 Cough: Secondary | ICD-10-CM | POA: Insufficient documentation

## 2014-12-08 DIAGNOSIS — F17200 Nicotine dependence, unspecified, uncomplicated: Secondary | ICD-10-CM | POA: Insufficient documentation

## 2014-12-08 DIAGNOSIS — K7030 Alcoholic cirrhosis of liver without ascites: Secondary | ICD-10-CM | POA: Diagnosis not present

## 2014-12-08 DIAGNOSIS — R079 Chest pain, unspecified: Secondary | ICD-10-CM | POA: Diagnosis not present

## 2014-12-08 DIAGNOSIS — J441 Chronic obstructive pulmonary disease with (acute) exacerbation: Secondary | ICD-10-CM | POA: Diagnosis present

## 2014-12-08 DIAGNOSIS — R42 Dizziness and giddiness: Secondary | ICD-10-CM | POA: Diagnosis not present

## 2014-12-08 DIAGNOSIS — Z9981 Dependence on supplemental oxygen: Secondary | ICD-10-CM | POA: Diagnosis not present

## 2014-12-08 DIAGNOSIS — F329 Major depressive disorder, single episode, unspecified: Secondary | ICD-10-CM | POA: Diagnosis not present

## 2014-12-08 DIAGNOSIS — Z7982 Long term (current) use of aspirin: Secondary | ICD-10-CM | POA: Insufficient documentation

## 2014-12-08 DIAGNOSIS — J440 Chronic obstructive pulmonary disease with acute lower respiratory infection: Secondary | ICD-10-CM | POA: Diagnosis not present

## 2014-12-08 DIAGNOSIS — Z880 Allergy status to penicillin: Secondary | ICD-10-CM | POA: Insufficient documentation

## 2014-12-08 DIAGNOSIS — J209 Acute bronchitis, unspecified: Secondary | ICD-10-CM | POA: Insufficient documentation

## 2014-12-08 DIAGNOSIS — Z79899 Other long term (current) drug therapy: Secondary | ICD-10-CM | POA: Diagnosis not present

## 2014-12-08 DIAGNOSIS — F419 Anxiety disorder, unspecified: Secondary | ICD-10-CM | POA: Insufficient documentation

## 2014-12-08 DIAGNOSIS — R0902 Hypoxemia: Secondary | ICD-10-CM | POA: Insufficient documentation

## 2014-12-08 LAB — CBC
HCT: 39.4 % (ref 35.0–47.0)
HEMOGLOBIN: 12.7 g/dL (ref 12.0–16.0)
MCH: 28.5 pg (ref 26.0–34.0)
MCHC: 32.2 g/dL (ref 32.0–36.0)
MCV: 88.6 fL (ref 80.0–100.0)
Platelets: 257 10*3/uL (ref 150–440)
RBC: 4.44 MIL/uL (ref 3.80–5.20)
RDW: 12.6 % (ref 11.5–14.5)
WBC: 8.1 10*3/uL (ref 3.6–11.0)

## 2014-12-08 LAB — BASIC METABOLIC PANEL
ANION GAP: 8 (ref 5–15)
BUN: 12 mg/dL (ref 6–20)
CALCIUM: 9 mg/dL (ref 8.9–10.3)
CO2: 30 mmol/L (ref 22–32)
Chloride: 102 mmol/L (ref 101–111)
Creatinine, Ser: 0.9 mg/dL (ref 0.44–1.00)
GFR calc Af Amer: >60 mL/min (ref >60)
Glucose, Bld: 103 mg/dL — ABNORMAL HIGH (ref 65–99)
Potassium: 3.5 mmol/L (ref 3.5–5.1)
Sodium: 140 mmol/L (ref 135–145)

## 2014-12-08 LAB — TROPONIN I: Troponin I: <0.03 ng/mL (ref <0.031)

## 2014-12-08 MED ORDER — QUETIAPINE FUMARATE 25 MG PO TABS
50.0000 mg | ORAL_TABLET | Freq: Every day | ORAL | Status: DC
  Administered 2014-12-08 – 2014-12-09 (×2): 50 mg via ORAL
  Filled 2014-12-08 (×2): qty 2

## 2014-12-08 MED ORDER — NICOTINE POLACRILEX 2 MG MT GUM
2.0000 mg | CHEWING_GUM | OROMUCOSAL | Status: DC | PRN
  Filled 2014-12-08: qty 1

## 2014-12-08 MED ORDER — GUAIFENESIN ER 600 MG PO TB12
600.0000 mg | ORAL_TABLET | Freq: Two times a day (BID) | ORAL | Status: DC
  Administered 2014-12-08 – 2014-12-10 (×4): 600 mg via ORAL
  Filled 2014-12-08 (×4): qty 1

## 2014-12-08 MED ORDER — GUAIFENESIN-DM 100-10 MG/5ML PO SYRP: 5.0000 mL | ORAL_SOLUTION | ORAL | Status: DC | PRN

## 2014-12-08 MED ORDER — METHYLPREDNISOLONE SODIUM SUCC 125 MG IJ SOLR
60.0000 mg | INTRAMUSCULAR | Status: DC
  Administered 2014-12-08: 60 mg via INTRAVENOUS
  Filled 2014-12-08: qty 2

## 2014-12-08 MED ORDER — ASPIRIN 81 MG PO CHEW
81.0000 mg | CHEWABLE_TABLET | Freq: Every day | ORAL | Status: DC
Start: 2014-12-08 — End: 2014-12-08

## 2014-12-08 MED ORDER — ALBUTEROL SULFATE (2.5 MG/3ML) 0.083% IN NEBU
INHALATION_SOLUTION | RESPIRATORY_TRACT | Status: AC
  Administered 2014-12-08: 2.5 mg via RESPIRATORY_TRACT
  Filled 2014-12-08: qty 3

## 2014-12-08 MED ORDER — NICOTINE 7 MG/24HR TD PT24
7.0000 mg | MEDICATED_PATCH | Freq: Every day | TRANSDERMAL | Status: DC
  Administered 2014-12-08 – 2014-12-10 (×3): 7 mg via TRANSDERMAL
  Filled 2014-12-08 (×3): qty 1

## 2014-12-08 MED ORDER — IPRATROPIUM-ALBUTEROL 0.5-2.5 (3) MG/3ML IN SOLN
RESPIRATORY_TRACT | Status: AC
  Administered 2014-12-08: 3 mL via RESPIRATORY_TRACT
  Filled 2014-12-08: qty 3

## 2014-12-08 MED ORDER — BUDESONIDE-FORMOTEROL FUMARATE 160-4.5 MCG/ACT IN AERO
2.0000 | INHALATION_SPRAY | Freq: Two times a day (BID) | RESPIRATORY_TRACT | Status: DC
  Administered 2014-12-08 – 2014-12-10 (×4): 2 via RESPIRATORY_TRACT
  Filled 2014-12-08: qty 6

## 2014-12-08 MED ORDER — IPRATROPIUM-ALBUTEROL 0.5-2.5 (3) MG/3ML IN SOLN
3.0000 mL | RESPIRATORY_TRACT | Status: DC | PRN
  Administered 2014-12-09 – 2014-12-10 (×5): 3 mL via RESPIRATORY_TRACT
  Filled 2014-12-08 (×6): qty 3

## 2014-12-08 MED ORDER — NITROGLYCERIN 2 % TD OINT
0.5000 [in_us] | TOPICAL_OINTMENT | Freq: Three times a day (TID) | TRANSDERMAL | Status: DC
  Administered 2014-12-08 – 2014-12-09 (×4): 0.5 [in_us] via TOPICAL
  Filled 2014-12-08 (×4): qty 1

## 2014-12-08 MED ORDER — ENOXAPARIN SODIUM 40 MG/0.4ML ~~LOC~~ SOLN
40.0000 mg | SUBCUTANEOUS | Status: DC
  Administered 2014-12-08 – 2014-12-09 (×2): 40 mg via SUBCUTANEOUS
  Filled 2014-12-08 (×2): qty 0.4

## 2014-12-08 MED ORDER — IPRATROPIUM-ALBUTEROL 0.5-2.5 (3) MG/3ML IN SOLN
3.0000 mL | Freq: Once | RESPIRATORY_TRACT | Status: AC
  Administered 2014-12-08: 3 mL via RESPIRATORY_TRACT

## 2014-12-08 MED ORDER — ASPIRIN EC 81 MG PO TBEC
81.0000 mg | DELAYED_RELEASE_TABLET | Freq: Every day | ORAL | Status: DC
  Administered 2014-12-08 – 2014-12-10 (×3): 81 mg via ORAL
  Filled 2014-12-08 (×3): qty 1

## 2014-12-08 MED ORDER — ALBUTEROL SULFATE (2.5 MG/3ML) 0.083% IN NEBU
2.5000 mg | INHALATION_SOLUTION | Freq: Once | RESPIRATORY_TRACT | Status: AC
  Administered 2014-12-08: 2.5 mg via RESPIRATORY_TRACT

## 2014-12-08 MED ORDER — MONTELUKAST SODIUM 10 MG PO TABS
10.0000 mg | ORAL_TABLET | Freq: Every day | ORAL | Status: DC
  Administered 2014-12-09: 10 mg via ORAL
  Filled 2014-12-08 (×2): qty 1

## 2014-12-08 MED ORDER — TIOTROPIUM BROMIDE MONOHYDRATE 18 MCG IN CAPS
18.0000 ug | ORAL_CAPSULE | Freq: Every day | RESPIRATORY_TRACT | Status: DC
  Administered 2014-12-09 – 2014-12-10 (×2): 18 ug via RESPIRATORY_TRACT
  Filled 2014-12-08: qty 5

## 2014-12-08 NOTE — ED Provider Notes (Signed)
[facility] Emergency Department Provider Note  ____________________________________________  Time seen: On arrival  I have reviewed the triage vital signs and the nursing notes.   HISTORY  Chief Complaint COPD    HPI [name] [name] is a 54 y.o. female with a history of COPD who presents with shortness of breath. Patient was referred from urgent care because of hypoxia and shortness of breath. She reports she has had worsening breathing over the last couple of days. She notes usually she gets better with steroids but after the steroid shot at urgent care she still feels short of breath. She denies fevers chills. She received Solu-Medrol and 2 nebulizers at urgent care. He does use 2 L of oxygen at night     Past Medical History  Diagnosis Date  . COPD (chronic obstructive pulmonary disease) ([place])   . Cancer ([place])   . Breast cancer [facility])     Patient Active Problem List   Diagnosis Date Noted  . Acute on chronic respiratory failure ([place]) 07/15/2014  . COPD exacerbation ([place]) 07/15/2014  . Acute bronchitis 07/15/2014  . Tachycardia 07/15/2014    Past Surgical History  Procedure Laterality Date  . Abdominal hysterectomy      Current Outpatient Rx  Name  Route  Sig  Dispense  Refill  . ATROVENT HFA 17 MCG/ACT inhaler   Inhalation   Inhale 2 puffs into the lungs 4 (four) times daily.      5     Dispense as written.   . budesonide-formoterol (SYMBICORT) 160-4.5 MCG/ACT inhaler   Inhalation   Inhale 2 puffs into the lungs 2 (two) times daily.   1 Inhaler   12   . montelukast (SINGULAIR) 10 MG tablet   Oral   Take 10 mg by mouth daily.      5   . PROAIR HFA 108 (90 BASE) MCG/ACT inhaler   Inhalation   Inhale 2 puffs into the lungs every 6 (six) hours as needed.      5     Dispense as written.   Marland Kitchen SPIRIVA HANDIHALER 18 MCG inhalation capsule   Inhalation   Place 18 mcg into inhaler and inhale daily.      5     Dispense  as written.   . predniSONE (DELTASONE) 20 MG tablet   Oral   Take 3 tablets (60 mg total) by mouth daily with breakfast.   15 tablet   0     Label and dispense accordingly Day 1 take 50 mg  ...   . QUEtiapine (SEROQUEL) 50 MG tablet   Oral   Take 50 mg by mouth at bedtime.           Allergies Penicillins  History reviewed. No pertinent family history.  Social History Social History  Substance Use Topics  . Smoking status: Current Some Day Smoker  . Smokeless tobacco: None  . Alcohol Use: No    Review of Systems  Constitutional: Negative for fever. Eyes: Negative for visual changes. ENT: Negative for sore throat Cardiovascular: Negative for chest pain. Respiratory: Positive for shortness of breath, negative for cough Gastrointestinal: Negative for abdominal pain, vomiting and diarrhea. Genitourinary: Negative for dysuria. Musculoskeletal: Negative for back pain. Skin: Negative for rash. Neurological: Negative for headaches or focal weakness Psychiatric: No anxiety    ____________________________________________   PHYSICAL EXAM:  VITAL SIGNS: ED Triage Vitals  Enc Vitals Group     BP 12/08/14 1719 123/85 mmHg  Pulse Rate 12/08/14 1719 96     Resp 12/08/14 1719 18     Temp 12/08/14 1719 98.4 F (36.9 C)     Temp Source 12/08/14 1719 Oral     SpO2 12/08/14 1719 95 %     Weight 12/08/14 1719 120 lb (54.432 kg)     Height 12/08/14 1719 5\' 2"  (1.575 m)     Head Cir --      Peak Flow --      Pain Score 12/08/14 1939 5     Pain Loc --      Pain Edu? --      Excl. in [place]? --      Constitutional: Alert and oriented. Well appearing and in no distress. Eyes: Conjunctivae are normal.  ENT   Head: Normocephalic and atraumatic.   Mouth/Throat: Mucous membranes are moist. Cardiovascular: Normal rate, regular rhythm. Normal and symmetric distal pulses are present in all extremities. No murmurs, rubs, or gallops. Respiratory: Increased respiratory  effort. Poor air movement bilaterally and expiratory wheezes Gastrointestinal: Soft and non-tender in all quadrants. No distention. There is no CVA tenderness. Genitourinary: deferred Musculoskeletal: Nontender with normal range of motion in all extremities. No lower extremity tenderness nor edema. Neurologic:  Normal speech and language. No gross focal neurologic deficits are appreciated. Skin:  Skin is warm, dry and intact. No rash noted. Psychiatric: Mood and affect are normal. Patient exhibits appropriate insight and judgment.  ____________________________________________    LABS (pertinent positives/negatives)  Labs Reviewed  BASIC METABOLIC PANEL - Abnormal; Notable for the following:    Glucose, Bld 103 (*)    All other components within normal limits  CBC    ____________________________________________   EKG  ED ECG REPORT I, Lavonia Drafts, the attending physician, personally viewed and interpreted this ECG.  Date: 12/08/2014 EKG Time: 5:37 PM Rate: 89 Rhythm: normal sinus rhythm QRS Axis: normal Intervals: normal ST/T Wave abnormalities: normal Conduction Disutrbances: none Narrative Interpretation: unremarkable   ____________________________________________    RADIOLOGY I have personally reviewed any xrays that were ordered on this patient: Patient had outpatient x-ray that was reported as normal  ____________________________________________   PROCEDURES  Procedure(s) performed: none  Critical Care performed: none  ____________________________________________   INITIAL IMPRESSION / ASSESSMENT AND PLAN / ED COURSE  Pertinent labs & imaging results that were available during my care of the patient were reviewed by me and considered in my medical decision making (see chart for details).  Patient presents with history of present illness consistent with COPD exacerbation. She received Solu-Medrol and 2 nebulizer treatments at urgent care. She has  received an additional 2 MLS or treatments here. She still is barely moving any air and when she got to the bathroom she became extremely winded. Given that she was hypoxic at urgent care and has had little improvement I think she should be admitted to the hospital. I will discuss with hospitalist  ____________________________________________   FINAL CLINICAL IMPRESSION(S) / ED DIAGNOSES  Final diagnoses:  COPD exacerbation ([place])     Lavonia Drafts, MD 12/08/14 2006

## 2014-12-08 NOTE — ED Notes (Signed)
States that she had a COPD exacerbation today.  Pt got a shot of steroids at [facility] and is feeling better.

## 2014-12-08 NOTE — H&P (Addendum)
[facility] [place] NAME: [name] [name]    MR#:  [id number]  DATE OF BIRTH:  1960/03/27  DATE OF ADMISSION:  12/08/2014  PRIMARY CARE PHYSICIAN: Letta Median, MD   REQUESTING/REFERRING PHYSICIAN:   CHIEF COMPLAINT:   Chief Complaint  Patient presents with  . COPD    HISTORY OF PRESENT ILLNESS: [name] [name]  is a 54 y.o. female with a known history of COPD,  ongoing tobacco abuse,  depression and anxiety who presents to the hospital with complaints of shortness of breath hypoxia and chest pains. According to the patient she's been having shortness of breath for the past 3 days, but it by cough most dry but sometimes with some yellow phlegm. She was seen by urgent care today and noted to have O2 sats of 89% on room air. Chest x-ray was done and it showed COPD but no pneumonia and patient was sent to emergency room for evaluation and admission. Patient also complains of chest pain which she describes as achiness,  Intermittent,  increasing with cough, no radiation. She does some lightheadedness and dizziness on exertion, also some muscle pains in the  legs  PAST MEDICAL HISTORY:   Past Medical History  Diagnosis Date  . COPD (chronic obstructive pulmonary disease) ([place])   . Cancer ([place])   . Breast cancer ([place])     PAST SURGICAL HISTORY:  Past Surgical History  Procedure Laterality Date  . Abdominal hysterectomy      SOCIAL HISTORY:  Social History  Substance Use Topics  . Smoking status: Current Some Day Smoker  . Smokeless tobacco: Not on file  . Alcohol Use: No    FAMILY HISTORY: History reviewed. No pertinent family history.  DRUG ALLERGIES:  Allergies  Allergen Reactions  . Penicillins Swelling, Rash and Other (See Comments)    Pt states that she had tongue swelling.   Has patient had a PCN reaction causing immediate rash, facial/tongue/throat swelling, SOB or lightheadedness with hypotension:  Yes Has patient had a PCN reaction causing severe rash involving mucus membranes or skin necrosis: No Has patient had a PCN reaction that required hospitalization No Has patient had a PCN reaction occurring within the last 10 years: No If all of the above answers are "NO", then may proceed with Cephalosporin use.    Review of Systems  Constitutional: Negative for fever, chills, weight loss and malaise/fatigue.  HENT: Negative for congestion.   Eyes: Negative for blurred vision and double vision.  Respiratory: Positive for cough, sputum production and shortness of breath. Negative for wheezing.   Cardiovascular: Positive for chest pain. Negative for palpitations, orthopnea, leg swelling and PND.  Gastrointestinal: Negative for nausea, vomiting, abdominal pain, diarrhea, constipation, blood in stool and melena.  Genitourinary: Negative for dysuria, urgency, frequency and hematuria.  Musculoskeletal: Negative for falls.  Skin: Negative for rash.  Neurological: Negative for dizziness and weakness.  Psychiatric/Behavioral: Negative for depression and memory loss. The patient is not nervous/anxious.     MEDICATIONS AT HOME:  Prior to Admission medications   Medication Sig Start Date End Date Taking? Authorizing Provider  albuterol (PROVENTIL HFA;VENTOLIN HFA) 108 (90 BASE) MCG/ACT inhaler Inhale 2 puffs into the lungs every 6 (six) hours as needed for wheezing or shortness of breath.   Yes Historical Provider, MD  aspirin EC 81 MG tablet Take 81 mg by mouth daily.   Yes Historical Provider, MD  budesonide-formoterol (SYMBICORT) 160-4.5 MCG/ACT inhaler Inhale 2 puffs into  the lungs 2 (two) times daily. 07/17/14  Yes Fritzi Mandes, MD  ipratropium (ATROVENT HFA) 17 MCG/ACT inhaler Inhale 2 puffs into the lungs 4 (four) times daily as needed for wheezing (and/or shortness of breath).   Yes Historical Provider, MD  ipratropium-albuterol (DUONEB) 0.5-2.5 (3) MG/3ML SOLN Take 3 mLs by nebulization every  4 (four) hours as needed (for wheezing/shortness of breath).   Yes Historical Provider, MD  montelukast (SINGULAIR) 10 MG tablet Take 10 mg by mouth at bedtime.   Yes Historical Provider, MD  QUEtiapine (SEROQUEL) 50 MG tablet Take 50 mg by mouth at bedtime.   Yes Historical Provider, MD  tiotropium (SPIRIVA) 18 MCG inhalation capsule Place 18 mcg into inhaler and inhale daily.   Yes Historical Provider, MD  predniSONE (DELTASONE) 20 MG tablet Take 3 tablets (60 mg total) by mouth daily with breakfast. Patient not taking: Reported on 12/08/2014 07/17/14   Fritzi Mandes, MD      PHYSICAL EXAMINATION:   VITAL SIGNS: Blood pressure 126/62, pulse 91, temperature 98.4 F (36.9 C), temperature source Oral, resp. rate 16, height 5\' 2"  (1.575 m), weight 54.432 kg (120 lb), SpO2 96 %.  GENERAL:  54 y.o.-year-old patient lying in the bed with no acute distress.  EYES: Pupils equal, round, reactive to light and accommodation. No scleral icterus. Extraocular muscles intact.  HEENT: Head atraumatic, normocephalic. Oropharynx and nasopharynx clear.  NECK:  Supple, no jugular venous distention. No thyroid enlargement, no tenderness.  LUNGS: Markedly diminished breath sounds bilaterally head basis posteriorly, no wheezing, rales,rhonchi or crepitation. No use of accessory muscles of respiration.  CARDIOVASCULAR: S1, S2 normal. No murmurs, rubs, or gallops. Chest is tender to palpation on the right to mid sternal area, corresponding to the discomfort area at the rest ABDOMEN: Soft, nontender, nondistended. Bowel sounds present. No organomegaly or mass.  EXTREMITIES: No pedal edema, cyanosis, or clubbing.  NEUROLOGIC: Cranial nerves II through XII are intact. Muscle strength 5/5 in all extremities. Sensation intact. Gait not checked.  PSYCHIATRIC: The patient is alert and oriented x 3.  SKIN: No obvious rash, lesion, or ulcer.   LABORATORY PANEL:   CBC  Recent Labs Lab 12/08/14 1734  WBC 8.1  HGB 12.7   HCT 39.4  PLT 257  MCV 88.6  MCH 28.5  MCHC 32.2  RDW 12.6   ------------------------------------------------------------------------------------------------------------------  Chemistries   Recent Labs Lab 12/08/14 1734  NA 140  K 3.5  CL 102  CO2 30  GLUCOSE 103*  BUN 12  CREATININE 0.90  CALCIUM 9.0   ------------------------------------------------------------------------------------------------------------------  Cardiac Enzymes No results for input(s): TROPONINI in the last 168 hours. ------------------------------------------------------------------------------------------------------------------  RADIOLOGY: No results found.  EKG: Orders placed or performed during the hospital encounter of 12/08/14  . ED EKG  . ED EKG   EKG revealed normal sinus rhythm at 89 beats per minute normal axis no acute ST-T changes IMPRESSION AND PLAN:  Active Problems:   COPD exacerbation (HCC)  1. acute on chronic chronic respiratory distress with hypoxia admit patient to the medical floor continue oxygen therapy, keeping pulse oximeter that on to 92-94% 2, COPD exacerbation, initiate patient on steroids nebulizers inhalers follow clinically.  3. Acute bronchitis , start patient on Zithromax intravenously, give her Humibid that and sputum cultures 4.  Chest pain , musculoskeletal likely, oxygen, aspirin nitroglycerin, checking cardiac enzymes 3 5. Tobacco abuse, patient states that she cut down from 1 pack a day to 3 cigarettes a day, recommended to quit discussed this patient for  4- 5 minutes,  nicotine replacement therapy will be initiated    All the records are reviewed and case discussed with ED provider. Management plans discussed with the patient, family and they are in agreement.  CODE STATUS: Full code   TOTAL TIME TAKING CARE OF THIS PATIENT: 50utes.    Theodoro Grist M.D on 12/08/2014 at 8:28 PM  Between 7am to 6pm - Pager - [phone] After 6pm go to  [email or web address] - password [email or web address] [facility]  Office  [phone]  CC: Primary care physician; Letta Median, MD

## 2014-12-09 DIAGNOSIS — J440 Chronic obstructive pulmonary disease with acute lower respiratory infection: Secondary | ICD-10-CM | POA: Diagnosis not present

## 2014-12-09 LAB — TROPONIN I: Troponin I: <0.03 ng/mL (ref <0.031)

## 2014-12-09 MED ORDER — METHYLPREDNISOLONE SODIUM SUCC 40 MG IJ SOLR
40.0000 mg | Freq: Two times a day (BID) | INTRAMUSCULAR | Status: DC
  Administered 2014-12-09 (×2): 40 mg via INTRAVENOUS
  Filled 2014-12-09 (×2): qty 1

## 2014-12-09 NOTE — Progress Notes (Signed)
[facility] [facility] [place] NAME: [name] [name]    MR#:  [id number]  DATE OF BIRTH:  [birth year]  SUBJECTIVE:  CHIEF COMPLAINT:   Chief Complaint  Patient presents with  . COPD   -Admitted for COPD exacerbation as she ran out of her medications. -Breathing is still worse, no wheezing noted at this time. Slowly improving. -Uses nocturnal home oxygen  REVIEW OF SYSTEMS:  Review of Systems  Constitutional: Negative for fever, chills and weight loss.  HENT: Negative for ear discharge, ear pain, hearing loss, nosebleeds and tinnitus.   Eyes: Negative for blurred vision, double vision and photophobia.  Respiratory: Positive for cough, shortness of breath and wheezing. Negative for hemoptysis.   Cardiovascular: Negative for chest pain, palpitations, orthopnea and leg swelling.  Gastrointestinal: Negative for nausea, vomiting, abdominal pain, diarrhea, constipation and melena.  Genitourinary: Negative for dysuria, urgency, frequency and hematuria.  Musculoskeletal: Negative for myalgias, back pain and neck pain.  Skin: Negative for rash.  Neurological: Positive for weakness. Negative for dizziness, tingling, tremors, speech change, focal weakness and headaches.  Endo/Heme/Allergies: Does not bruise/bleed easily.  Psychiatric/Behavioral: Negative for depression.    DRUG ALLERGIES:   Allergies  Allergen Reactions  . Penicillins Swelling, Rash and Other (See Comments)    Pt states that she had tongue swelling.   Has patient had a PCN reaction causing immediate rash, facial/tongue/throat swelling, SOB or lightheadedness with hypotension: Yes Has patient had a PCN reaction causing severe rash involving mucus membranes or skin necrosis: No Has patient had a PCN reaction that required hospitalization No Has patient had a PCN reaction occurring within the last 10 years: No If all of the above answers are "NO", then may proceed with  Cephalosporin use.    VITALS:  Blood pressure 118/75, pulse 83, temperature 97.8 F (36.6 C), temperature source Oral, resp. rate 18, height 5\' 2"  (1.575 m), weight 52.572 kg (115 lb 14.4 oz), SpO2 99 %.  PHYSICAL EXAMINATION:  Physical Exam  GENERAL:  54 y.o.-year-old patient lying in the bed with no acute distress.  EYES: Pupils equal, round, reactive to light and accommodation. No scleral icterus. Extraocular muscles intact.  HEENT: Head atraumatic, normocephalic. Oropharynx and nasopharynx clear.  NECK:  Supple, no jugular venous distention. No thyroid enlargement, no tenderness.  LUNGS: Scant breath sounds bilaterally, tight on auscultation., no wheezing, rales,rhonchi or crepitation. No use of accessory muscles of respiration.  CARDIOVASCULAR: S1, S2 normal. No murmurs, rubs, or gallops.  ABDOMEN: Soft, nontender, nondistended. Bowel sounds present. No organomegaly or mass.  EXTREMITIES: No pedal edema, cyanosis, or clubbing.  NEUROLOGIC: Cranial nerves II through XII are intact. Muscle strength 5/5 in all extremities. Sensation intact. Gait not checked.  PSYCHIATRIC: The patient is alert and oriented x 3.  SKIN: No obvious rash, lesion, or ulcer.    LABORATORY PANEL:   CBC  Recent Labs Lab 12/08/14 1734  WBC 8.1  HGB 12.7  HCT 39.4  PLT 257   ------------------------------------------------------------------------------------------------------------------  Chemistries   Recent Labs Lab 12/08/14 1734  NA 140  K 3.5  CL 102  CO2 30  GLUCOSE 103*  BUN 12  CREATININE 0.90  CALCIUM 9.0   ------------------------------------------------------------------------------------------------------------------  Cardiac Enzymes  Recent Labs Lab 12/09/14 0356  TROPONINI <0.03   ------------------------------------------------------------------------------------------------------------------  RADIOLOGY:  No results found.  EKG:   Orders placed or performed  during the hospital encounter of 12/08/14  . ED EKG  . ED EKG    ASSESSMENT  AND PLAN:   54 year old female with history of alcoholic liver cirrhosis, COPD on nocturnal home oxygen and, history of breast cancer presents to the hospital secondary to COPD exacerbation  #1 acute on chronic COPD exacerbation-secondary to medication noncompliance and also ongoing smoking. -States that she ran out of her medications. -Continue steroids, on Solu-Medrol-adjust the dose. -Continue Symbicort and donuts and cough medicines. -Also on Singulair.  #2 anxiety and depression- on seroquel  #3 tobacco use disorder-counseled and also nicotine patch  #4 history of breast cancer-status post lumpectomy and radiation. -In remission currently  #5 DVT prophylaxis-on Lovenox    All the records are reviewed and case discussed with Care Management/Social Workerr. Management plans discussed with the patient, family and they are in agreement.  CODE STATUS: Full code  TOTAL TIME TAKING CARE OF THIS PATIENT: 36 minutes.   POSSIBLE D/C IN 1-2 DAYS, DEPENDING ON CLINICAL CONDITION.   Gladstone Lighter M.D on 12/09/2014 at 8:39 AM  Between 7am to 6pm - Pager - [phone]  After 6pm go to [email or web address] - password [email or web address] [facility]  Office  [phone]  CC: Primary care physician; Letta Median, MD

## 2014-12-10 DIAGNOSIS — J440 Chronic obstructive pulmonary disease with acute lower respiratory infection: Secondary | ICD-10-CM | POA: Diagnosis not present

## 2014-12-10 MED ORDER — MORPHINE SULFATE (PF) 2 MG/ML IV SOLN: 1.0000 mg | Freq: Once | INTRAVENOUS | Status: DC

## 2014-12-10 MED ORDER — BUDESONIDE-FORMOTEROL FUMARATE 160-4.5 MCG/ACT IN AERO: 2.0000 | INHALATION_SPRAY | Freq: Two times a day (BID) | RESPIRATORY_TRACT | Status: DC

## 2014-12-10 MED ORDER — MONTELUKAST SODIUM 10 MG PO TABS: 10.0000 mg | ORAL_TABLET | Freq: Every day | ORAL | Status: DC

## 2014-12-10 MED ORDER — ASPIRIN 81 MG PO TBEC: 81.0000 mg | DELAYED_RELEASE_TABLET | Freq: Every day | ORAL | Status: DC

## 2014-12-10 MED ORDER — IPRATROPIUM-ALBUTEROL 0.5-2.5 (3) MG/3ML IN SOLN: 3.0000 mL | RESPIRATORY_TRACT | Status: DC | PRN

## 2014-12-10 MED ORDER — GUAIFENESIN ER 600 MG PO TB12: 600.0000 mg | ORAL_TABLET | Freq: Two times a day (BID) | ORAL | Status: DC

## 2014-12-10 MED ORDER — ACETAMINOPHEN 325 MG PO TABS
325.0000 mg | ORAL_TABLET | ORAL | Status: DC | PRN
  Administered 2014-12-10: 650 mg via ORAL
  Filled 2014-12-10: qty 1

## 2014-12-10 MED ORDER — QUETIAPINE FUMARATE 50 MG PO TABS: 50.0000 mg | ORAL_TABLET | Freq: Every day | ORAL | Status: DC

## 2014-12-10 MED ORDER — TIOTROPIUM BROMIDE MONOHYDRATE 18 MCG IN CAPS: 18.0000 ug | ORAL_CAPSULE | Freq: Every day | RESPIRATORY_TRACT | Status: DC

## 2014-12-10 MED ORDER — ACETAMINOPHEN 325 MG PO TABS: 650.0000 mg | ORAL_TABLET | ORAL | Status: DC | PRN

## 2014-12-10 MED ORDER — PREDNISONE 10 MG PO TABS: 20.0000 mg | ORAL_TABLET | Freq: Every day | ORAL | Status: DC

## 2014-12-10 MED ORDER — ALBUTEROL SULFATE HFA 108 (90 BASE) MCG/ACT IN AERS: 2.0000 | INHALATION_SPRAY | Freq: Four times a day (QID) | RESPIRATORY_TRACT | Status: DC | PRN

## 2014-12-10 NOTE — Discharge Summary (Signed)
[name] NAME: [name] [name]    MR#:  [id number]  DATE OF BIRTH:  [birth year]  DATE OF ADMISSION:  12/08/2014 ADMITTING PHYSICIAN: Theodoro Grist, MD  DATE OF DISCHARGE: 12/10/2014  PRIMARY CARE PHYSICIAN: [name] Median, MD    ADMISSION DIAGNOSIS:  COPD exacerbation ([place]) [J44.1]  DISCHARGE DIAGNOSIS:  Active Problems:   COPD exacerbation ([place])   SECONDARY DIAGNOSIS:   Past Medical History  Diagnosis Date  . COPD (chronic obstructive pulmonary disease) ([place])   . Cancer ([place])   . Breast cancer [facility])     HOSPITAL COURSE:   54 year old female with history of alcoholic liver cirrhosis, COPD on nocturnal home oxygen and, history of breast cancer presents to the hospital secondary to COPD exacerbation  #1 Acute on chronic COPD exacerbation-secondary to medication noncompliance and also ongoing smoking. -States that she ran out of her medications. Improving now - off o2, uses nocturnal o2 at home - will discharge on oral steroids now. -Continue Symbicort , duonebs and cough medicines. -Also on Singulair.  #2 anxiety and depression- on seroquel  #3 tobacco use disorder-counseled and also nicotine patch while in the hospital  #4 history of breast cancer-status post lumpectomy and radiation. -In remission currently  Discharge today  DISCHARGE CONDITIONS:   stable  CONSULTS OBTAINED:   None  DRUG ALLERGIES:   Allergies  Allergen Reactions  . Penicillins Swelling, Rash and Other (See Comments)    Pt states that she had tongue swelling.   Has patient had a PCN reaction causing immediate rash, facial/tongue/throat swelling, SOB or lightheadedness with hypotension: Yes Has patient had a PCN reaction causing severe rash involving mucus membranes or skin necrosis: No Has patient had a PCN reaction that required hospitalization No Has patient had a PCN reaction occurring within the last 10 years:  No If all of the above answers are "NO", then may proceed with Cephalosporin use.    DISCHARGE MEDICATIONS:   Current Discharge Medication List    START taking these medications   Details  guaiFENesin (MUCINEX) 600 MG 12 hr tablet Take 1 tablet (600 mg total) by mouth 2 (two) times daily. Qty: 20 tablet, Refills: 0      CONTINUE these medications which have CHANGED   Details  albuterol (PROVENTIL HFA;VENTOLIN HFA) 108 (90 BASE) MCG/ACT inhaler Inhale 2 puffs into the lungs every 6 (six) hours as needed for wheezing or shortness of breath. Qty: 1 Inhaler, Refills: 2    aspirin EC 81 MG EC tablet Take 1 tablet (81 mg total) by mouth daily. Qty: 30 tablet, Refills: 1    budesonide-formoterol (SYMBICORT) 160-4.5 MCG/ACT inhaler Inhale 2 puffs into the lungs 2 (two) times daily. Qty: 1 Inhaler, Refills: 2    ipratropium-albuterol (DUONEB) 0.5-2.5 (3) MG/3ML SOLN Take 3 mLs by nebulization every 4 (four) hours as needed (for wheezing/shortness of breath). Qty: 360 mL, Refills: 2    montelukast (SINGULAIR) 10 MG tablet Take 1 tablet (10 mg total) by mouth at bedtime. Qty: 30 tablet, Refills: 2    predniSONE (DELTASONE) 10 MG tablet Take 2 tablets (20 mg total) by mouth daily with breakfast. 6 tabs PO x 1 day 5 tabs PO x 1 day 4 tabs PO x 1 day 3 tabs PO x 1 day 2 tabs PO x 1 day 1 tab PO x 1 day and stop Qty: 21 tablet, Refills: 0    QUEtiapine (SEROQUEL) 50 MG tablet Take 1 tablet (50  mg total) by mouth at bedtime. Qty: 30 tablet, Refills: 02    tiotropium (SPIRIVA) 18 MCG inhalation capsule Place 1 capsule (18 mcg total) into inhaler and inhale daily. Qty: 30 capsule, Refills: 2      STOP taking these medications     ipratropium (ATROVENT HFA) 17 MCG/ACT inhaler          DISCHARGE INSTRUCTIONS:   1. PCP f/u in 1-2 weeks  If you experience worsening of your admission symptoms, develop shortness of breath, life threatening emergency, suicidal or homicidal thoughts  you must seek medical attention immediately by calling 911 or calling your MD immediately  if symptoms less severe.  You Must read complete instructions/literature along with all the possible adverse reactions/side effects for all the Medicines you take and that have been prescribed to you. Take any new Medicines after you have completely understood and accept all the possible adverse reactions/side effects.   Please note  You were cared for by a hospitalist during your hospital stay. If you have any questions about your discharge medications or the care you received while you were in the hospital after you are discharged, you can call the unit and asked to speak with the hospitalist on call if the hospitalist that took care of you is not available. Once you are discharged, your primary care physician will handle any further medical issues. Please note that NO REFILLS for any discharge medications will be authorized once you are discharged, as it is imperative that you return to your primary care physician (or establish a relationship with a primary care physician if you do not have one) for your aftercare needs so that they can reassess your need for medications and monitor your lab values.    Today   CHIEF COMPLAINT:   Chief Complaint  Patient presents with  . COPD    VITAL SIGNS:  Blood pressure 116/76, pulse 74, temperature 98.4 F (36.9 C), temperature source Oral, resp. rate 18, height 5\' 2"  (1.575 m), weight 52.572 kg (115 lb 14.4 oz), SpO2 100 %.  I/O:   Intake/Output Summary (Last 24 hours) at 12/10/14 1118 Last data filed at 12/10/14 0000  Gross per 24 hour  Intake    780 ml  Output      0 ml  Net    780 ml    PHYSICAL EXAMINATION:   Physical Exam  GENERAL: 54 y.o.-year-old patient lying in the bed with no acute distress.  EYES: Pupils equal, round, reactive to light and accommodation. No scleral icterus. Extraocular muscles intact.  HEENT: Head atraumatic,  normocephalic. Oropharynx and nasopharynx clear.  NECK: Supple, no jugular venous distention. No thyroid enlargement, no tenderness.  LUNGS: Scant breath sounds bilaterally, tight on auscultation., no wheezing, rales,rhonchi or crepitation. No use of accessory muscles of respiration.  CARDIOVASCULAR: S1, S2 normal. No murmurs, rubs, or gallops.  ABDOMEN: Soft, nontender, nondistended. Bowel sounds present. No organomegaly or mass.  EXTREMITIES: No pedal edema, cyanosis, or clubbing.  NEUROLOGIC: Cranial nerves II through XII are intact. Muscle strength 5/5 in all extremities. Sensation intact. Gait not checked.  PSYCHIATRIC: The patient is alert and oriented x 3.  SKIN: No obvious rash, lesion, or ulcer.   DATA REVIEW:   CBC  Recent Labs Lab 12/08/14 1734  WBC 8.1  HGB 12.7  HCT 39.4  PLT 257    Chemistries   Recent Labs Lab 12/08/14 1734  NA 140  K 3.5  CL 102  CO2 30  GLUCOSE 103*  BUN 12  CREATININE 0.90  CALCIUM 9.0    Cardiac Enzymes  Recent Labs Lab 12/09/14 0356  TROPONINI <0.03    Microbiology Results  Results for orders placed or performed during the hospital encounter of 05/10/14  Culture, blood (single)     Status: None   Collection Time: 05/10/14  9:03 PM  Result Value Ref Range Status   Micro Text Report   Final       COMMENT                   NO GROWTH AEROBICALLY/ANAEROBICALLY IN 5 DAYS   ANTIBIOTIC                                                      Culture, blood (single)     Status: None   Collection Time: 05/10/14  9:22 PM  Result Value Ref Range Status   Micro Text Report   Final       COMMENT                   NO GROWTH AEROBICALLY/ANAEROBICALLY IN 5 DAYS   ANTIBIOTIC                                                        RADIOLOGY:  No results found.  EKG:   Orders placed or performed during the hospital encounter of 12/08/14  . ED EKG  . ED EKG      Management plans discussed with the patient, family  and they are in agreement.  CODE STATUS:     Code Status Orders        Start     Ordered   12/08/14 2133  Full code   Continuous     12/08/14 2133      TOTAL TIME TAKING CARE OF THIS PATIENT: 37 minutes.    Gladstone Lighter M.D on 12/10/2014 at 11:18 AM  Between 7am to 6pm - Pager - [phone]  After 6pm go to [email or web address] - password [email or web address] [facility]  Office  [phone]  CC: Primary care physician; [name] Median, MD

## 2015-02-27 ENCOUNTER — Emergency Department: Payer: Medicare Other

## 2015-02-27 ENCOUNTER — Encounter: Payer: Self-pay | Admitting: Emergency Medicine

## 2015-02-27 ENCOUNTER — Emergency Department
Admission: EM | Admit: 2015-02-27 | Discharge: 2015-02-27 | Disposition: A | Discharge status: Home / Self Care | Payer: Medicare Other | Attending: Emergency Medicine | Admitting: Emergency Medicine

## 2015-02-27 DIAGNOSIS — Z880 Allergy status to penicillin: Secondary | ICD-10-CM | POA: Diagnosis not present

## 2015-02-27 DIAGNOSIS — F17200 Nicotine dependence, unspecified, uncomplicated: Secondary | ICD-10-CM | POA: Insufficient documentation

## 2015-02-27 DIAGNOSIS — R0789 Other chest pain: Secondary | ICD-10-CM | POA: Insufficient documentation

## 2015-02-27 DIAGNOSIS — Z79899 Other long term (current) drug therapy: Secondary | ICD-10-CM | POA: Diagnosis not present

## 2015-02-27 DIAGNOSIS — Z7951 Long term (current) use of inhaled steroids: Secondary | ICD-10-CM | POA: Insufficient documentation

## 2015-02-27 DIAGNOSIS — Z7982 Long term (current) use of aspirin: Secondary | ICD-10-CM | POA: Insufficient documentation

## 2015-02-27 DIAGNOSIS — J441 Chronic obstructive pulmonary disease with (acute) exacerbation: Secondary | ICD-10-CM | POA: Diagnosis not present

## 2015-02-27 DIAGNOSIS — R079 Chest pain, unspecified: Secondary | ICD-10-CM | POA: Diagnosis present

## 2015-02-27 LAB — CBC
HEMATOCRIT: 41.1 % (ref 35.0–47.0)
HEMOGLOBIN: 13.4 g/dL (ref 12.0–16.0)
MCH: 28.9 pg (ref 26.0–34.0)
MCHC: 32.6 g/dL (ref 32.0–36.0)
MCV: 88.5 fL (ref 80.0–100.0)
Platelets: 279 10*3/uL (ref 150–440)
RBC: 4.65 MIL/uL (ref 3.80–5.20)
RDW: 12.4 % (ref 11.5–14.5)
WBC: 7.2 10*3/uL (ref 3.6–11.0)

## 2015-02-27 LAB — BASIC METABOLIC PANEL
ANION GAP: 8 (ref 5–15)
BUN: 12 mg/dL (ref 6–20)
CHLORIDE: 104 mmol/L (ref 101–111)
CO2: 25 mmol/L (ref 22–32)
Calcium: 9 mg/dL (ref 8.9–10.3)
Creatinine, Ser: 0.6 mg/dL (ref 0.44–1.00)
GFR calc Af Amer: >60 mL/min (ref >60)
GLUCOSE: 78 mg/dL (ref 65–99)
POTASSIUM: 3.6 mmol/L (ref 3.5–5.1)
Sodium: 137 mmol/L (ref 135–145)

## 2015-02-27 LAB — TROPONIN I: Troponin I: <0.03 ng/mL (ref <0.031)

## 2015-02-27 MED ORDER — TIOTROPIUM BROMIDE MONOHYDRATE 18 MCG IN CAPS: 18.0000 ug | ORAL_CAPSULE | Freq: Every day | RESPIRATORY_TRACT | Status: DC

## 2015-02-27 MED ORDER — PREDNISONE 20 MG PO TABS: 40.0000 mg | ORAL_TABLET | Freq: Every day | ORAL | Status: DC

## 2015-02-27 MED ORDER — METHYLPREDNISOLONE SODIUM SUCC 125 MG IJ SOLR
125.0000 mg | Freq: Once | INTRAMUSCULAR | Status: AC
  Administered 2015-02-27: 125 mg via INTRAMUSCULAR
  Filled 2015-02-27: qty 2

## 2015-02-27 MED ORDER — ALBUTEROL SULFATE HFA 108 (90 BASE) MCG/ACT IN AERS: 2.0000 | INHALATION_SPRAY | Freq: Four times a day (QID) | RESPIRATORY_TRACT | Status: DC | PRN

## 2015-02-27 MED ORDER — AZITHROMYCIN 250 MG PO TABS: ORAL_TABLET | ORAL | Status: DC

## 2015-02-27 MED ORDER — IPRATROPIUM-ALBUTEROL 0.5-2.5 (3) MG/3ML IN SOLN
3.0000 mL | RESPIRATORY_TRACT | Status: DC
  Administered 2015-02-27: 3 mL via RESPIRATORY_TRACT
  Filled 2015-02-27: qty 3

## 2015-02-27 NOTE — ED Provider Notes (Signed)
[facility] Emergency Department Provider Note  ____________________________________________  Time seen: 2:40 PM  I have reviewed the triage vital signs and the nursing notes.   HISTORY  Chief Complaint Chest Pain    HPI [name] [name] is a 55 y.o. female who complains of worsening anterior chest pain today. She's had chest pain off and on lasting one or 2 minutes for the past few days. No shortness of breath or nausea or vomiting or diaphoresis or dizziness. It is not exertional nor pleuritic. Today it's been constant since about 9:00 AM. It describes heaviness, nonradiating. Worse with movement. Worse with palpation.  Gay Filler ran out of her pro-air and Spiriva.   Past Medical History  Diagnosis Date  . COPD (chronic obstructive pulmonary disease) ([place])   . Cancer ([place])   . Breast cancer [facility])      Patient Active Problem List   Diagnosis Date Noted  . Acute on chronic respiratory failure ([place]) 07/15/2014  . COPD exacerbation ([place]) 07/15/2014  . Acute bronchitis 07/15/2014  . Tachycardia 07/15/2014     Past Surgical History  Procedure Laterality Date  . Abdominal hysterectomy       Current Outpatient Rx  Name  Route  Sig  Dispense  Refill  . aspirin EC 81 MG EC tablet   Oral   Take 1 tablet (81 mg total) by mouth daily.   30 tablet   1   . budesonide-formoterol (SYMBICORT) 160-4.5 MCG/ACT inhaler   Inhalation   Inhale 2 puffs into the lungs 2 (two) times daily.   1 Inhaler   2   . guaiFENesin (MUCINEX) 600 MG 12 hr tablet   Oral   Take 1 tablet (600 mg total) by mouth 2 (two) times daily.   20 tablet   0   . ipratropium-albuterol (DUONEB) 0.5-2.5 (3) MG/3ML SOLN   Nebulization   Take 3 mLs by nebulization every 4 (four) hours as needed (for wheezing/shortness of breath).   360 mL   2   . montelukast (SINGULAIR) 10 MG tablet   Oral   Take 1 tablet (10 mg total) by mouth at bedtime.   30 tablet   2   . QUEtiapine  (SEROQUEL) 50 MG tablet   Oral   Take 1 tablet (50 mg total) by mouth at bedtime.   30 tablet   02   . albuterol (PROVENTIL HFA;VENTOLIN HFA) 108 (90 Base) MCG/ACT inhaler   Inhalation   Inhale 2 puffs into the lungs every 6 (six) hours as needed for wheezing or shortness of breath.   1 Inhaler   2   . azithromycin (ZITHROMAX Z-PAK) 250 MG tablet      Take 2 tablets (500 mg) on  Day 1,  followed by 1 tablet (250 mg) once daily on Days 2 through 5.   6 each   0   . predniSONE (DELTASONE) 20 MG tablet   Oral   Take 2 tablets (40 mg total) by mouth daily.   8 tablet   0   . tiotropium (SPIRIVA) 18 MCG inhalation capsule   Inhalation   Place 1 capsule (18 mcg total) into inhaler and inhale daily.   30 capsule   2      Allergies Penicillins   History reviewed. No pertinent family history.  Social History Social History  Substance Use Topics  . Smoking status: Current Some Day Smoker  . Smokeless tobacco: None  . Alcohol Use: No    Review of  Systems  Constitutional:   No fever or chills. No weight changes Eyes:   No blurry vision or double vision.  ENT:   No sore throat. Cardiovascular:   Positive as above chest pain. Respiratory:   No dyspnea positive increased cough. Gastrointestinal:   Negative for abdominal pain, vomiting and diarrhea.  No BRBPR or melena. Genitourinary:   Negative for dysuria, urinary retention, bloody urine, or difficulty urinating. Musculoskeletal:   Negative for back pain. No joint swelling or pain. Skin:   Negative for rash. Neurological:   Negative for headaches, focal weakness or numbness. Psychiatric:  No anxiety or depression.   Endocrine:  No hot/cold intolerance, changes in energy, or sleep difficulty.  10-point ROS otherwise negative.  ____________________________________________   PHYSICAL EXAM:  VITAL SIGNS: ED Triage Vitals  Enc Vitals Group     BP 02/27/15 1605 150/90 mmHg     Pulse Rate 02/27/15 1605 90      Resp 02/27/15 1605 20     Temp --      Temp src --      SpO2 02/27/15 1605 96 %     Weight --      Height --      Head Cir --      Peak Flow --      Pain Score 02/27/15 1324 7     Pain Loc --      Pain Edu? --      Excl. in [place]? --     Vital signs reviewed, nursing assessments reviewed.   Constitutional:   Alert and oriented. Well appearing and in no distress. Eyes:   No scleral icterus. No conjunctival pallor. PERRL. EOMI ENT   Head:   Normocephalic and atraumatic.   Nose:   No congestion/rhinnorhea. No septal hematoma   Mouth/Throat:   MMM, no pharyngeal erythema. No peritonsillar mass. No uvula shift.   Neck:   No stridor. No SubQ emphysema. No meningismus. Hematological/Lymphatic/Immunilogical:   No cervical lymphadenopathy. Cardiovascular:   RRR. Normal and symmetric distal pulses are present in all extremities. No murmurs, rubs, or gallops. Respiratory:   Normal respiratory effort without tachypnea nor retractions. Diminished breath sounds diffusely. No wheezing. Rhonchi bilateral bases. Gastrointestinal:   Soft and nontender. No distention. There is no CVA tenderness.  No rebound, rigidity, or guarding. Genitourinary:   deferred Musculoskeletal:   Nontender with normal range of motion in all extremities. No joint effusions.  No lower extremity tenderness.  No edema. Chest wall nontender and indicated areas anteriorly over the superior sternum and laterally in the left axilla. This reproduces her chest pain Neurologic:   Normal speech and language.  CN 2-10 normal. Motor grossly intact. No pronator drift.  Normal gait. No gross focal neurologic deficits are appreciated.  Skin:    Skin is warm, dry and intact. No rash noted.  No petechiae, purpura, or bullae. Psychiatric:   Mood and affect are normal. Speech and behavior are normal. Patient exhibits appropriate insight and judgment.  ____________________________________________    LABS (pertinent  positives/negatives) (all labs ordered are listed, but only abnormal results are displayed) Labs Reviewed  BASIC METABOLIC PANEL  CBC  TROPONIN I   ____________________________________________   EKG  Interpreted by me Sinus rhythm rate of 97, normal axis and intervals. Normal QRS ST segments and T waves. There are 2 PVCs on the strip.  ____________________________________________    RADIOLOGY  Chest x-ray unremarkable  ____________________________________________   PROCEDURES   ____________________________________________   INITIAL IMPRESSION /  ASSESSMENT AND PLAN / ED COURSE  Pertinent labs & imaging results that were available during my care of the patient were reviewed by me and considered in my medical decision making (see chart for details).  Patient well appearing no acute distress. Appears to have chest wall pain which is reproducible. Considering the patient's symptoms, medical history, and physical examination today, I have low suspicion for ACS, PE, TAD, pneumothorax, carditis, mediastinitis, pneumonia, CHF, or sepsis.  Most likely she is having a viral illness which is causing exacerbation of her COPD. This is worsened because she ran out of both of her inhalers recently. With the crackles on exam and her worsened symptoms all give her azithromycin and a course of steroids while refilling her inhalers. We'll have her follow up with primary care.     ____________________________________________   FINAL CLINICAL IMPRESSION(S) / ED DIAGNOSES  Final diagnoses:  COPD exacerbation ([place])  Acute chest wall pain      Carrie Mew, MD 02/27/15 1636

## 2015-02-27 NOTE — ED Notes (Signed)
Reports chest pain onset today, denies sob or nausea.  Skin w.d with good color.

## 2015-02-27 NOTE — Discharge Instructions (Signed)
Chronic Obstructive Pulmonary Disease Exacerbation Chronic obstructive pulmonary disease (COPD) is a common lung condition in which airflow from the lungs is limited. COPD is a general term that can be used to describe many different lung problems that limit airflow, including chronic bronchitis and emphysema. COPD exacerbations are episodes when breathing symptoms become much worse and require extra treatment. Without treatment, COPD exacerbations can be life threatening, and frequent COPD exacerbations can cause further damage to your lungs. CAUSES  Respiratory infections.  Exposure to smoke.  Exposure to air pollution, chemical fumes, or dust. Sometimes there is no apparent cause or trigger. RISK FACTORS  Smoking cigarettes.  Older age.  Frequent prior COPD exacerbations. SIGNS AND SYMPTOMS  Increased coughing.  Increased thick spit (sputum) production.  Increased wheezing.  Increased shortness of breath.  Rapid breathing.  Chest tightness. DIAGNOSIS Your medical history, a physical exam, and tests will help your health care provider make a diagnosis. Tests may include:  A chest X-ray.  Basic lab tests.  Sputum testing.  An arterial blood gas test. TREATMENT Depending on the severity of your COPD exacerbation, you may need to be admitted to a hospital for treatment. Some of the treatments commonly used to treat COPD exacerbations are:   Antibiotic medicines.  Bronchodilators. These are drugs that expand the air passages. They may be given with an inhaler or nebulizer. Spacer devices may be needed to help improve drug delivery.  Corticosteroid medicines.  Supplemental oxygen therapy.  Airway clearing techniques, such as noninvasive ventilation (NIV) and positive expiratory pressure (PEP). These provide respiratory support through a mask or other noninvasive device. HOME CARE INSTRUCTIONS  Do not smoke. Quitting smoking is very important to prevent COPD from  getting worse and exacerbations from happening as often.  Avoid exposure to all substances that irritate the airway, especially to tobacco smoke.  If you were prescribed an antibiotic medicine, finish it all even if you start to feel better.  Take all medicines as directed by your health care provider.It is important to use correct technique with inhaled medicines.  Drink enough fluids to keep your urine clear or pale yellow (unless you have a medical condition that requires fluid restriction).  Use a cool mist vaporizer. This makes it easier to clear your chest when you cough.  If you have a home nebulizer and oxygen, continue to use them as directed.  Maintain all necessary vaccinations to prevent infections.  Exercise regularly.  Eat a healthy diet.  Keep all follow-up appointments as directed by your health care provider. SEEK IMMEDIATE MEDICAL CARE IF:  You have worsening shortness of breath.  You have trouble talking.  You have severe chest pain.  You have blood in your sputum.  You have a fever.  You have weakness, vomit repeatedly, or faint.  You feel confused.  You continue to get worse. MAKE SURE YOU:  Understand these instructions.  Will watch your condition.  Will get help right away if you are not doing well or get worse.   This information is not intended to replace advice given to you by your health care provider. Make sure you discuss any questions you have with your health care provider.   Document Released: 11/17/2006 Document Revised: 02/10/2014 Document Reviewed: 09/24/2012 Elsevier Interactive Patient Education 2016 [employer]  Chest Wall Pain Chest wall pain is pain in or around the bones and muscles of your chest. Sometimes, an injury causes this pain. Sometimes, the cause may not be known. This pain may  take several weeks or longer to get better. HOME CARE INSTRUCTIONS  Pay attention to any changes in your symptoms. Take these actions  to help with your pain:   Rest as told by your health care provider.   Avoid activities that cause pain. These include any activities that use your chest muscles or your abdominal and side muscles to lift heavy items.   If directed, apply ice to the painful area:  Put ice in a plastic bag.  Place a towel between your skin and the bag.  Leave the ice on for 20 minutes, 2-3 times per day.  Take over-the-counter and prescription medicines only as told by your health care provider.  Do not use tobacco products, including cigarettes, chewing tobacco, and e-cigarettes. If you need help quitting, ask your health care provider.  Keep all follow-up visits as told by your health care provider. This is important. SEEK MEDICAL CARE IF:  You have a fever.  Your chest pain becomes worse.  You have new symptoms. SEEK IMMEDIATE MEDICAL CARE IF:  You have nausea or vomiting.  You feel sweaty or light-headed.  You have a cough with phlegm (sputum) or you cough up blood.  You develop shortness of breath.   This information is not intended to replace advice given to you by your health care provider. Make sure you discuss any questions you have with your health care provider.   Document Released: 01/20/2005 Document Revised: 10/11/2014 Document Reviewed: 04/17/2014 Elsevier Interactive Patient Education [employer].

## 2015-03-10 ENCOUNTER — Emergency Department: Payer: Medicare Other

## 2015-03-10 ENCOUNTER — Emergency Department
Admission: EM | Admit: 2015-03-10 | Discharge: 2015-03-10 | Disposition: A | Discharge status: Home / Self Care | Payer: Medicare Other | Attending: Emergency Medicine | Admitting: Emergency Medicine

## 2015-03-10 ENCOUNTER — Encounter: Payer: Self-pay | Admitting: Emergency Medicine

## 2015-03-10 DIAGNOSIS — Z7982 Long term (current) use of aspirin: Secondary | ICD-10-CM | POA: Diagnosis not present

## 2015-03-10 DIAGNOSIS — J441 Chronic obstructive pulmonary disease with (acute) exacerbation: Secondary | ICD-10-CM

## 2015-03-10 DIAGNOSIS — Z792 Long term (current) use of antibiotics: Secondary | ICD-10-CM | POA: Diagnosis not present

## 2015-03-10 DIAGNOSIS — J449 Chronic obstructive pulmonary disease, unspecified: Secondary | ICD-10-CM | POA: Diagnosis not present

## 2015-03-10 DIAGNOSIS — R0602 Shortness of breath: Secondary | ICD-10-CM | POA: Diagnosis present

## 2015-03-10 DIAGNOSIS — Z7951 Long term (current) use of inhaled steroids: Secondary | ICD-10-CM | POA: Diagnosis not present

## 2015-03-10 DIAGNOSIS — Z880 Allergy status to penicillin: Secondary | ICD-10-CM | POA: Insufficient documentation

## 2015-03-10 DIAGNOSIS — Z79899 Other long term (current) drug therapy: Secondary | ICD-10-CM | POA: Insufficient documentation

## 2015-03-10 DIAGNOSIS — Z7952 Long term (current) use of systemic steroids: Secondary | ICD-10-CM | POA: Insufficient documentation

## 2015-03-10 DIAGNOSIS — F17200 Nicotine dependence, unspecified, uncomplicated: Secondary | ICD-10-CM | POA: Insufficient documentation

## 2015-03-10 LAB — CBC
HCT: 35.9 % (ref 35.0–47.0)
HEMOGLOBIN: 12.1 g/dL (ref 12.0–16.0)
MCH: 29.4 pg (ref 26.0–34.0)
MCHC: 33.8 g/dL (ref 32.0–36.0)
MCV: 87.1 fL (ref 80.0–100.0)
Platelets: 265 10*3/uL (ref 150–440)
RBC: 4.12 MIL/uL (ref 3.80–5.20)
RDW: 12.5 % (ref 11.5–14.5)
WBC: 8.7 10*3/uL (ref 3.6–11.0)

## 2015-03-10 LAB — BASIC METABOLIC PANEL
ANION GAP: 7 (ref 5–15)
BUN: 12 mg/dL (ref 6–20)
CALCIUM: 8.7 mg/dL — AB (ref 8.9–10.3)
CO2: 28 mmol/L (ref 22–32)
Chloride: 104 mmol/L (ref 101–111)
Creatinine, Ser: 0.7 mg/dL (ref 0.44–1.00)
Glucose, Bld: 136 mg/dL — ABNORMAL HIGH (ref 65–99)
POTASSIUM: 3.7 mmol/L (ref 3.5–5.1)
Sodium: 139 mmol/L (ref 135–145)

## 2015-03-10 LAB — FIBRIN DERIVATIVES D-DIMER (ARMC ONLY): FIBRIN DERIVATIVES D-DIMER (ARMC): 194 (ref 0–499)

## 2015-03-10 LAB — TROPONIN I

## 2015-03-10 MED ORDER — PREDNISONE 20 MG PO TABS
40.0000 mg | ORAL_TABLET | Freq: Once | ORAL | Status: AC
  Administered 2015-03-10: 40 mg via ORAL
  Filled 2015-03-10: qty 2

## 2015-03-10 MED ORDER — PREDNISONE 20 MG PO TABS: 40.0000 mg | ORAL_TABLET | Freq: Every day | ORAL | Status: DC

## 2015-03-10 MED ORDER — LEVOFLOXACIN 750 MG PO TABS
750.0000 mg | ORAL_TABLET | Freq: Once | ORAL | Status: AC
  Administered 2015-03-10: 750 mg via ORAL
  Filled 2015-03-10: qty 1

## 2015-03-10 MED ORDER — LEVOFLOXACIN 750 MG PO TABS: 750.0000 mg | ORAL_TABLET | Freq: Every day | ORAL | Status: DC

## 2015-03-10 MED ORDER — IPRATROPIUM BROMIDE HFA 17 MCG/ACT IN AERS: 1.0000 | INHALATION_SPRAY | Freq: Three times a day (TID) | RESPIRATORY_TRACT | Status: DC

## 2015-03-10 MED ORDER — IPRATROPIUM-ALBUTEROL 0.5-2.5 (3) MG/3ML IN SOLN
3.0000 mL | Freq: Once | RESPIRATORY_TRACT | Status: AC
  Administered 2015-03-10: 3 mL via RESPIRATORY_TRACT
  Filled 2015-03-10: qty 3

## 2015-03-10 NOTE — ED Notes (Signed)
Pt to ed with c/o sob.  Pt was seen here about 2 weeks ago for same.  D/C with prednisone and abx but states no relief.  Last breathing treatment was at 10 am.

## 2015-03-10 NOTE — ED Notes (Addendum)
Pt O2 sat 94% on room air. Pt states "that is low for me" and requested to be on 2L n/c which is what she uses at home. Breathing symmetrical, labored at this time. O2 applied.

## 2015-03-10 NOTE — Discharge Instructions (Signed)
We believe that your symptoms are caused today by an exacerbation of your COPD, and possibly bronchitis.  Please take the prescribed medications and any medications that you have at home for your COPD.  Follow up with your doctor as recommended.  If you develop any new or worsening symptoms, including but not limited to fever, persistent vomiting, worsening shortness of breath, or other symptoms that concern you, please return to the Emergency Department immediately. ° ° °Chronic Obstructive Pulmonary Disease Exacerbation °Chronic obstructive pulmonary disease (COPD) is a common lung condition in which airflow from the lungs is limited. COPD is a general term that can be used to describe many different lung problems that limit airflow, including chronic bronchitis and emphysema. COPD exacerbations are episodes when breathing symptoms become much worse and require extra treatment. Without treatment, COPD exacerbations can be life threatening, and frequent COPD exacerbations can cause further damage to your lungs. °CAUSES °· Respiratory infections. °· Exposure to smoke. °· Exposure to air pollution, chemical fumes, or dust. °Sometimes there is no apparent cause or trigger. °RISK FACTORS °· Smoking cigarettes. °· Older age. °· Frequent prior COPD exacerbations. °SIGNS AND SYMPTOMS °· Increased coughing. °· Increased thick spit (sputum) production. °· Increased wheezing. °· Increased shortness of breath. °· Rapid breathing. °· Chest tightness. °DIAGNOSIS °Your medical history, a physical exam, and tests will help your health care provider make a diagnosis. Tests may include: °· A chest X-ray. °· Basic lab tests. °· Sputum testing. °· An arterial blood gas test. °TREATMENT °Depending on the severity of your COPD exacerbation, you may need to be admitted to a hospital for treatment. Some of the treatments commonly used to treat COPD exacerbations are:  °· Antibiotic medicines. °· Bronchodilators. These are drugs that  expand the air passages. They may be given with an inhaler or nebulizer. Spacer devices may be needed to help improve drug delivery. °· Corticosteroid medicines. °· Supplemental oxygen therapy. °· Airway clearing techniques, such as noninvasive ventilation (NIV) and positive expiratory pressure (PEP). These provide respiratory support through a mask or other noninvasive device. °HOME CARE INSTRUCTIONS °· Do not smoke. Quitting smoking is very important to prevent COPD from getting worse and exacerbations from happening as often. °· Avoid exposure to all substances that irritate the airway, especially to tobacco smoke. °· If you were prescribed an antibiotic medicine, finish it all even if you start to feel better. °· Take all medicines as directed by your health care provider. It is important to use correct technique with inhaled medicines. °· Drink enough fluids to keep your urine clear or pale yellow (unless you have a medical condition that requires fluid restriction). °· Use a cool mist vaporizer. This makes it easier to clear your chest when you cough. °· If you have a home nebulizer and oxygen, continue to use them as directed. °· Maintain all necessary vaccinations to prevent infections. °· Exercise regularly. °· Eat a healthy diet. °· Keep all follow-up appointments as directed by your health care provider. °SEEK IMMEDIATE MEDICAL CARE IF: °· You have worsening shortness of breath. °· You have trouble talking. °· You have severe chest pain. °· You have blood in your sputum. °· You have a fever. °· You have weakness, vomit repeatedly, or faint. °· You feel confused. °· You continue to get worse. °MAKE SURE YOU: °· Understand these instructions. °· Will watch your condition. °· Will get help right away if you are not doing well or get worse. °  °This information is not   intended to replace advice given to you by your health care provider. Make sure you discuss any questions you have with your health care  provider. °  °Document Released: 11/17/2006 Document Revised: 02/10/2014 Document Reviewed: 09/24/2012 °Elsevier Interactive Patient Education ©2016 Elsevier Inc. ° °

## 2015-03-10 NOTE — ED Provider Notes (Signed)
[facility] Emergency Department Provider Note  ____________________________________________  Time seen: Approximately 4:05 PM  I have reviewed the triage vital signs and the nursing notes.   HISTORY  Chief Complaint Shortness of Breath    HPI [name] [name] is a 55 y.o. female the long history of COPD on home oxygen.  Patient presents today states that she's had ongoing shortness of breath since her last ER visit. She reports her symptoms didn't seem to improve, and she returns as she continues to experience a feeling of shortness of breath. She is not having any pain. No fever or leg swelling. She does continue to smoke. She has taken prednisone and azithromycin, but reports no relief in symptoms over the last 2 weeks.  She reports she finds that she's having use her oxygen more frequently, sometimes she does not need it at home but reports she's been using almost daily for the last 2 weeks.   Past Medical History  Diagnosis Date  . COPD (chronic obstructive pulmonary disease) ([place])   . Cancer ([place])   . Breast cancer [facility])     Patient Active Problem List   Diagnosis Date Noted  . Acute on chronic respiratory failure ([place]) 07/15/2014  . COPD exacerbation ([place]) 07/15/2014  . Acute bronchitis 07/15/2014  . Tachycardia 07/15/2014    Past Surgical History  Procedure Laterality Date  . Abdominal hysterectomy      Current Outpatient Rx  Name  Route  Sig  Dispense  Refill  . albuterol (PROVENTIL HFA;VENTOLIN HFA) 108 (90 Base) MCG/ACT inhaler   Inhalation   Inhale 2 puffs into the lungs every 6 (six) hours as needed for wheezing or shortness of breath.   1 Inhaler   2   . albuterol (PROVENTIL HFA;VENTOLIN HFA) 108 (90 Base) MCG/ACT inhaler   Inhalation   Inhale 2 puffs into the lungs every 6 (six) hours as needed for wheezing or shortness of breath.         Marland Kitchen aspirin EC 81 MG EC tablet   Oral   Take 1 tablet (81 mg total) by mouth  daily.   30 tablet   1   . budesonide-formoterol (SYMBICORT) 160-4.5 MCG/ACT inhaler   Inhalation   Inhale 2 puffs into the lungs 2 (two) times daily.   1 Inhaler   2   . guaiFENesin (MUCINEX) 600 MG 12 hr tablet   Oral   Take 1 tablet (600 mg total) by mouth 2 (two) times daily. Patient taking differently: Take 600 mg by mouth 2 (two) times daily as needed for cough or to loosen phlegm.    20 tablet   0   . ipratropium (ATROVENT HFA) 17 MCG/ACT inhaler   Inhalation   Inhale 1 puff into the lungs 3 (three) times daily.   1 Inhaler   2   . ipratropium-albuterol (DUONEB) 0.5-2.5 (3) MG/3ML SOLN   Nebulization   Take 3 mLs by nebulization every 4 (four) hours as needed (for wheezing/shortness of breath).         Marland Kitchen levofloxacin (LEVAQUIN) 750 MG tablet   Oral   Take 1 tablet (750 mg total) by mouth daily.   5 tablet   0   . montelukast (SINGULAIR) 10 MG tablet   Oral   Take 1 tablet (10 mg total) by mouth at bedtime.   30 tablet   2   . predniSONE (DELTASONE) 20 MG tablet   Oral   Take 2 tablets (40 mg total)  by mouth daily with breakfast.   10 tablet   0   . QUEtiapine (SEROQUEL) 50 MG tablet   Oral   Take 1 tablet (50 mg total) by mouth at bedtime.   30 tablet   02   . tiotropium (SPIRIVA) 18 MCG inhalation capsule   Inhalation   Place 1 capsule (18 mcg total) into inhaler and inhale daily.   30 capsule   2   . tiotropium (SPIRIVA) 18 MCG inhalation capsule   Inhalation   Place 18 mcg into inhaler and inhale daily.           Allergies Penicillins  History reviewed. No pertinent family history.  Social History Social History  Substance Use Topics  . Smoking status: Current Some Day Smoker  . Smokeless tobacco: None  . Alcohol Use: No    Review of Systems Constitutional: No fever/chills Eyes: No visual changes. ENT: No sore throat. Cardiovascular: Denies chest pain. Respiratory: Slight nonproductive cough. Occasional wheezing. Mild  shortness of breath continuous almost daily. Gastrointestinal: No abdominal pain.  No nausea, no vomiting.  No diarrhea.  No constipation. Genitourinary: Negative for dysuria. Musculoskeletal: Negative for back pain. Skin: Negative for rash. Neurological: Negative for headaches, focal weakness or numbness.  10-point ROS otherwise negative.  ____________________________________________   PHYSICAL EXAM:  VITAL SIGNS: ED Triage Vitals  Enc Vitals Group     BP 03/10/15 1344 114/75 mmHg     Pulse Rate 03/10/15 1344 114     Resp 03/10/15 1344 20     Temp 03/10/15 1344 97.2 F (36.2 C)     Temp Source 03/10/15 1344 Oral     SpO2 03/10/15 1344 93 %     Weight 03/10/15 1344 120 lb (54.432 kg)     Height 03/10/15 1344 5\' 2"  (1.575 m)     Head Cir --      Peak Flow --      Pain Score 03/10/15 1344 0     Pain Loc --      Pain Edu? --      Excl. in [place]? --    Constitutional: Alert and oriented. Well appearing and in no acute distress. Eyes: Conjunctivae are normal. PERRL. EOMI. Head: Atraumatic. Nose: No congestion/rhinnorhea. Mouth/Throat: Mucous membranes are moist.  Oropharynx non-erythematous. Neck: No stridor.   Cardiovascular: Normal rate, regular rhythm. Grossly normal heart sounds.  Good peripheral circulation. Respiratory: There chest it, slightly increased respiratory effort but no distress. Lungs are clear to auscultation but does have a slightly increased expiratory phase. No wheezing. Speaks in full sentences. Gastrointestinal: Soft and nontender. No distention. No abdominal bruits. No CVA tenderness. Musculoskeletal: No lower extremity tenderness nor edema.  No joint effusions. No evidence of DVT. Neurologic:  Normal speech and language. No gross focal neurologic deficits are appreciated. Skin:  Skin is warm, dry and intact. No rash noted. Psychiatric: Mood and affect are normal. Speech and behavior are normal.  ____________________________________________   LABS (all  labs ordered are listed, but only abnormal results are displayed)  Labs Reviewed  BASIC METABOLIC PANEL - Abnormal; Notable for the following:    Glucose, Bld 136 (*)    Calcium 8.7 (*)    All other components within normal limits  CBC  TROPONIN I  FIBRIN DERIVATIVES D-DIMER ([facility] ONLY)   ____________________________________________  EKG  ED ECG REPORT I, Berdell Hostetler, the attending physician, personally viewed and interpreted this ECG.  Date: 03/10/2015 EKG Time: 1350 Rate: 100 Rhythm: normal sinus rhythm QRS  Axis: normal Intervals: normal ST/T Wave abnormalities: normal Conduction Disturbances: none Narrative Interpretation: unremarkable  Patient is notably on Seroquel. QTC noted. Upper limits of normal. ____________________________________________  RADIOLOGY  DG Chest 2 View (Final result) Result time: 03/10/15 16:20:12   Final result by Rad Results In Interface (03/10/15 16:20:12)   Narrative:   CLINICAL DATA: Known COPD, asthma, difficulty breathing for the past few days, no fever.  EXAM: CHEST 2 VIEW  COMPARISON: 02/27/2015  FINDINGS: Lungs are hyperinflated. There is perihilar peribronchial thickening. Subsegmental atelectasis is identified in the right middle lobe and lingula, similar in appearance to the previous exam. There are no focal consolidations or pleural effusions. No pulmonary edema.  Visualized osseous structures have a normal appearance. Surgical clips are noted in the right upper quadrant of the abdomen.  IMPRESSION: 1. Bronchitic changes and hyperinflation. 2. Bilateral atelectasis/scarring is stable.   Electronically Signed By: Nolon Nations M.D. On: 03/10/2015 16:20    ____________________________________________   PROCEDURES  Procedure(s) performed: None  Critical Care performed: No  ____________________________________________   INITIAL IMPRESSION / ASSESSMENT AND PLAN / ED COURSE  Pertinent labs &  imaging results that were available during my care of the patient were reviewed by me and considered in my medical decision making (see chart for details).  Patient presents for persistent feeling of mild dyspnea and cough since last ER stay. She is currently in no distress, does have slight increased work of breathing. Her oxygen saturation is normal on her 2 L/m which she does use at home. She doesn't exhibit any clear wheezing or distress. She is afebrile. I would entertain the possibility that this is most likely ongoing persistent COPD exacerbation, I feel less likely acute cardiac, pneumothorax, or pulmonary embolism. She is low risk by well's criteria. No recent hospitalizations, long trips, travel, history of prior DVT, or hemoptysis. We'll screen for pulmonary medicine. D-dimer.  I will treat her with nebulizers, restart steroids, and observe for improvement in the emergency room.  ----------------------------------------- 5:03 PM on 03/10/2015 -----------------------------------------  Patient awake alert. States her breathing currently feels like her "every day". She states that she feels normal. Chest x-ray demonstrates no acute infiltrate, her troponin is normal and her d-dimer is less than 500. Patient reports significant improvement and is resting comfortably. Lungs clear no distress. Speaks in full sentences.  We'll discharge patient home, we'll re-prescribe prednisone and add Levaquin given persistence of symptoms and history of COPD. Patient reports she has ample amounts of nebulizer solution at home, but does request an Atrovent inhaler which she can use when she is away from home.  Return precautions and treatment recommendations and follow-up discussed with the patient who is agreeable with the plan.  ____________________________________________   FINAL CLINICAL IMPRESSION(S) / ED DIAGNOSES  Final diagnoses:  COPD exacerbation (HCC)      Delman Kitten, MD 03/10/15  1705

## 2015-05-04 ENCOUNTER — Emergency Department
Admission: EM | Admit: 2015-05-04 | Discharge: 2015-05-04 | Disposition: A | Discharge status: Home / Self Care | Payer: Medicare Other | Source: Home / Self Care | Attending: Emergency Medicine | Admitting: Emergency Medicine

## 2015-05-04 ENCOUNTER — Emergency Department: Payer: Medicare Other

## 2015-05-04 ENCOUNTER — Encounter: Payer: Self-pay | Admitting: Emergency Medicine

## 2015-05-04 DIAGNOSIS — Z853 Personal history of malignant neoplasm of breast: Secondary | ICD-10-CM

## 2015-05-04 DIAGNOSIS — F17200 Nicotine dependence, unspecified, uncomplicated: Secondary | ICD-10-CM | POA: Insufficient documentation

## 2015-05-04 DIAGNOSIS — Z7951 Long term (current) use of inhaled steroids: Secondary | ICD-10-CM

## 2015-05-04 DIAGNOSIS — J9620 Acute and chronic respiratory failure, unspecified whether with hypoxia or hypercapnia: Secondary | ICD-10-CM | POA: Insufficient documentation

## 2015-05-04 DIAGNOSIS — Z7982 Long term (current) use of aspirin: Secondary | ICD-10-CM | POA: Insufficient documentation

## 2015-05-04 DIAGNOSIS — J441 Chronic obstructive pulmonary disease with (acute) exacerbation: Secondary | ICD-10-CM | POA: Insufficient documentation

## 2015-05-04 DIAGNOSIS — Z79899 Other long term (current) drug therapy: Secondary | ICD-10-CM

## 2015-05-04 LAB — FIBRIN DERIVATIVES D-DIMER (ARMC ONLY): FIBRIN DERIVATIVES D-DIMER (ARMC): 177 (ref 0–499)

## 2015-05-04 LAB — CBC
HCT: 36.8 % (ref 35.0–47.0)
HEMOGLOBIN: 12.4 g/dL (ref 12.0–16.0)
MCH: 28.9 pg (ref 26.0–34.0)
MCHC: 33.7 g/dL (ref 32.0–36.0)
MCV: 85.7 fL (ref 80.0–100.0)
Platelets: 293 10*3/uL (ref 150–440)
RBC: 4.29 MIL/uL (ref 3.80–5.20)
RDW: 12.5 % (ref 11.5–14.5)
WBC: 9 10*3/uL (ref 3.6–11.0)

## 2015-05-04 LAB — BASIC METABOLIC PANEL
ANION GAP: 5 (ref 5–15)
BUN: 14 mg/dL (ref 6–20)
CALCIUM: 8.7 mg/dL — AB (ref 8.9–10.3)
CO2: 26 mmol/L (ref 22–32)
Chloride: 104 mmol/L (ref 101–111)
Creatinine, Ser: 0.6 mg/dL (ref 0.44–1.00)
GFR calc Af Amer: >60 mL/min (ref >60)
GLUCOSE: 100 mg/dL — AB (ref 65–99)
Potassium: 3.9 mmol/L (ref 3.5–5.1)
SODIUM: 135 mmol/L (ref 135–145)

## 2015-05-04 LAB — TROPONIN I

## 2015-05-04 MED ORDER — ALBUTEROL SULFATE HFA 108 (90 BASE) MCG/ACT IN AERS: 2.0000 | INHALATION_SPRAY | Freq: Four times a day (QID) | RESPIRATORY_TRACT | Status: DC | PRN

## 2015-05-04 MED ORDER — LEVOFLOXACIN 750 MG PO TABS: 750.0000 mg | ORAL_TABLET | Freq: Every day | ORAL | Status: DC

## 2015-05-04 MED ORDER — IPRATROPIUM-ALBUTEROL 0.5-2.5 (3) MG/3ML IN SOLN
9.0000 mL | Freq: Once | RESPIRATORY_TRACT | Status: AC
  Administered 2015-05-04: 9 mL via RESPIRATORY_TRACT
  Filled 2015-05-04: qty 9

## 2015-05-04 MED ORDER — PREDNISONE 20 MG PO TABS: 60.0000 mg | ORAL_TABLET | Freq: Every day | ORAL | Status: DC

## 2015-05-04 MED ORDER — METHYLPREDNISOLONE SODIUM SUCC 125 MG IJ SOLR
125.0000 mg | Freq: Once | INTRAMUSCULAR | Status: AC
  Administered 2015-05-04: 125 mg via INTRAVENOUS
  Filled 2015-05-04: qty 2

## 2015-05-04 MED ORDER — KETOROLAC TROMETHAMINE 30 MG/ML IJ SOLN
30.0000 mg | Freq: Once | INTRAMUSCULAR | Status: AC
  Administered 2015-05-04: 30 mg via INTRAVENOUS
  Filled 2015-05-04: qty 1

## 2015-05-04 MED ORDER — LEVOFLOXACIN 750 MG PO TABS
750.0000 mg | ORAL_TABLET | Freq: Once | ORAL | Status: AC
  Administered 2015-05-04: 750 mg via ORAL
  Filled 2015-05-04: qty 1

## 2015-05-04 MED ORDER — ALBUTEROL SULFATE (2.5 MG/3ML) 0.083% IN NEBU
7.5000 mg | INHALATION_SOLUTION | Freq: Once | RESPIRATORY_TRACT | Status: AC
  Administered 2015-05-04: 7.5 mg via RESPIRATORY_TRACT
  Filled 2015-05-04: qty 9

## 2015-05-04 NOTE — ED Notes (Signed)
Pt has had SHOB for 2 days that is worse with exertion.  Sent from urgent care for fluid on lungs.  Lungs feel tight per pt.  Sleeps on 2 pillows all the time.  Denies swelling.

## 2015-05-04 NOTE — ED Provider Notes (Signed)
[facility] Emergency Department Provider Note  ____________________________________________  Time seen: Approximately 5 PM  I have reviewed the triage vital signs and the nursing notes.   HISTORY  Chief Complaint Shortness of Breath    HPI [name] [name] is a 55 y.o. female with a history of COPD and breast cancer was presenting to the emergency department today with 3 days of difficulty breathing as well as cough. Says that she also has a mild amount of runny nose and a "scratchy throat." She says that she also hasintermittent, sharp right-sided chest pain which is worse with coughing. The patient has been seen earlier today in urgent care and sent to the emergency department for further evaluation. There was a concern for fluid on her lungs at the urgent care on x-ray. Discussed this case with the PA, Waylan Boga, with the permission of the patient. The PA says that the film has not been interpreted by radiologist that there was some suspicion for fluid in the fissure. Mr. Jenne Campus also the patient said that she had fluid on her lungs during her last admission [place]. However, upon review of her last admission she was admitted for COPD and there was no issue of pulmonary edema on her last discharge summary. Patient says that she has been taking breathing treatments at home as well as one breathing treatment earlier in urgent care without any significant relief. Not had any steroids.   Past Medical History  Diagnosis Date  . COPD (chronic obstructive pulmonary disease) ([place])   . Cancer ([place])   . Breast cancer [facility])     Patient Active Problem List   Diagnosis Date Noted  . Acute on chronic respiratory failure ([place]) 07/15/2014  . COPD exacerbation ([place]) 07/15/2014  . Acute bronchitis 07/15/2014  . Tachycardia 07/15/2014    Past Surgical History  Procedure Laterality Date  . Abdominal hysterectomy      Current Outpatient Rx  Name  Route  Sig   Dispense  Refill  . albuterol (PROVENTIL HFA;VENTOLIN HFA) 108 (90 Base) MCG/ACT inhaler   Inhalation   Inhale 2 puffs into the lungs every 6 (six) hours as needed for wheezing or shortness of breath.   1 Inhaler   2   . aspirin EC 81 MG EC tablet   Oral   Take 1 tablet (81 mg total) by mouth daily.   30 tablet   1   . budesonide-formoterol (SYMBICORT) 160-4.5 MCG/ACT inhaler   Inhalation   Inhale 2 puffs into the lungs 2 (two) times daily.   1 Inhaler   2   . guaiFENesin (MUCINEX) 600 MG 12 hr tablet   Oral   Take 1 tablet (600 mg total) by mouth 2 (two) times daily. Patient taking differently: Take 600 mg by mouth 2 (two) times daily as needed for cough or to loosen phlegm.    20 tablet   0   . ipratropium (ATROVENT HFA) 17 MCG/ACT inhaler   Inhalation   Inhale 1 puff into the lungs 3 (three) times daily.   1 Inhaler   2   . ipratropium-albuterol (DUONEB) 0.5-2.5 (3) MG/3ML SOLN   Nebulization   Take 3 mLs by nebulization every 4 (four) hours as needed (for wheezing/shortness of breath).         Marland Kitchen levofloxacin (LEVAQUIN) 750 MG tablet   Oral   Take 1 tablet (750 mg total) by mouth daily.   5 tablet   0   . montelukast (SINGULAIR) 10  MG tablet   Oral   Take 1 tablet (10 mg total) by mouth at bedtime. Patient taking differently: Take 10 mg by mouth every morning.    30 tablet   2   . predniSONE (DELTASONE) 20 MG tablet   Oral   Take 2 tablets (40 mg total) by mouth daily with breakfast.   10 tablet   0   . QUEtiapine (SEROQUEL) 50 MG tablet   Oral   Take 1 tablet (50 mg total) by mouth at bedtime.   30 tablet   02   . tiotropium (SPIRIVA) 18 MCG inhalation capsule   Inhalation   Place 1 capsule (18 mcg total) into inhaler and inhale daily.   30 capsule   2     Allergies Penicillins  History reviewed. No pertinent family history.  Social History Social History  Substance Use Topics  . Smoking status: Current Some Day Smoker  . Smokeless  tobacco: None  . Alcohol Use: No    Review of Systems Constitutional: No fever/chills Eyes: No visual changes. ENT: No sore throat. Cardiovascular: as above  Respiratory: as above  Gastrointestinal: No abdominal pain.  No nausea, no vomiting.  No diarrhea.  No constipation. Genitourinary: Negative for dysuria. Musculoskeletal: right-sided back pain.  Skin: Negative for rash. Neurological: Negative for headaches, focal weakness or numbness.  10-point ROS otherwise negative.  ____________________________________________   PHYSICAL EXAM:  VITAL SIGNS: ED Triage Vitals  Enc Vitals Group     BP 05/04/15 1623 125/76 mmHg     Pulse Rate 05/04/15 1617 92     Resp 05/04/15 1617 24     Temp 05/04/15 1617 98.3 F (36.8 C)     Temp Source 05/04/15 1617 Oral     SpO2 05/04/15 1617 95 %     Weight 05/04/15 1617 120 lb (54.432 kg)     Height 05/04/15 1617 5\' 2"  (1.575 m)     Head Cir --      Peak Flow --      Pain Score 05/04/15 1615 5     Pain Loc --      Pain Edu? --      Excl. in [place]? --     Constitutional: Alert and oriented. Well appearing and in no acute distress. Eyes: Conjunctivae are normal. PERRL. EOMI. Head: Atraumatic. Nose: No congestion/rhinnorhea. Mouth/Throat: Mucous membranes are moist.  Oropharynx non-erythematous. Neck: No stridor.   Cardiovascular: Normal rate, regular rhythm. Grossly normal heart sounds.  Good peripheral circulation. Chest pain to the right side is reproducible palpation. No focality or crepitus or deformity palpated. Respiratory: Normal respiratory effort.  No retractions. Mild tachypnea with prolonged expiratory phase. Bases with mild rales bilaterally.. Gastrointestinal: Soft and nontender. No distention.  No CVA tenderness. Musculoskeletal: No lower extremity tenderness nor edema.  No joint effusions. Right rhomboid and trapezius group are tender to palpation. Neurologic:  Normal speech and language. No gross focal neurologic deficits are  appreciated.  Skin:  Skin is warm, dry and intact. No rash noted. Psychiatric: Mood and affect are normal. Speech and behavior are normal.  ____________________________________________   LABS (all labs ordered are listed, but only abnormal results are displayed)  Labs Reviewed  BASIC METABOLIC PANEL - Abnormal; Notable for the following:    Glucose, Bld 100 (*)    Calcium 8.7 (*)    All other components within normal limits  CBC  TROPONIN I  FIBRIN DERIVATIVES D-DIMER ([facility] ONLY)   ____________________________________________  EKG  ED ECG  REPORT I, Doran Stabler, the attending physician, personally viewed and interpreted this ECG.   Date: 05/04/2015  EKG Time: 1625  Rate: 103  Rhythm: sinus tachycardia  Axis: Normal axis  Intervals:none  ST&T Change: No ST segment elevation or depression. T-wave inversions in aVL as well as V2. Previous EKGs with T-wave inversion in aVL. V2 T-wave inversion possibly related to lead placement.  ____________________________________________  RADIOLOGY   DG Chest 2 View (Final result) Result time: 05/04/15 16:45:52   Final result by Rad Results In Interface (05/04/15 16:45:52)   Narrative:   CLINICAL DATA: Shortness of breath  EXAM: CHEST 2 VIEW  COMPARISON: 03/10/2015 chest radiograph.  FINDINGS: Stable cardiomediastinal silhouette with normal heart size. No pneumothorax. No pleural effusion. Hyperinflated lungs. Platelike scarring versus atelectasis in the mid lungs bilaterally is unchanged. No pulmonary edema. No acute consolidative airspace disease.  IMPRESSION: 1. Stable platelike scarring versus atelectasis in the bilateral mid lungs. No acute consolidative airspace disease. 2. Hyperinflated lungs, suggesting COPD.   Electronically Signed By: Ilona Sorrel M.D. On: 05/04/2015 16:45     ____________________________________________   PROCEDURES    ____________________________________________   INITIAL IMPRESSION / ASSESSMENT AND PLAN / ED COURSE  Pertinent labs & imaging results that were available during my care of the patient were reviewed by me and considered in my medical decision making (see chart for details).  ----------------------------------------- 8:11 PM on 05/04/2015 -----------------------------------------  After first round of DuoNeb. Patient says that she still felt "tight." She was given a second round of albuterol nebulizer treatments and says that she now feels much improved. She is speaking in full sentences. On reevaluation her rest for rate is 18 breath for minute. She is clear to auscultation bilaterally. She'll be discharged with antibiotics as well as steroids and an inhaler. [place] days of symptoms with a reassuring cardiac workup. Also with negative d-dimer. ____________________________________________   FINAL CLINICAL IMPRESSION(S) / ED DIAGNOSES  COPD exacerbation.    Orbie Pyo, MD 05/04/15 2012

## 2015-05-04 NOTE — ED Notes (Signed)
Pt. Going home by self.  Will follow up with PCP in one week.

## 2015-05-04 NOTE — ED Notes (Signed)
Pt reports "scratchy throat" with tightness in chest x 3 days. Pt has COPD and is on 2L oxygen at home, so placed on [place] during assessment. Pt alert & oriented. NAD noted.

## 2015-05-06 ENCOUNTER — Emergency Department: Payer: Medicare Other

## 2015-05-06 ENCOUNTER — Inpatient Hospital Stay
Admission: EM | Admit: 2015-05-06 | Discharge: 2015-05-14 | DRG: 189 | Disposition: A | Discharge status: Home Health | Payer: Medicare Other | Attending: Internal Medicine | Admitting: Internal Medicine

## 2015-05-06 DIAGNOSIS — F419 Anxiety disorder, unspecified: Secondary | ICD-10-CM | POA: Diagnosis present

## 2015-05-06 DIAGNOSIS — Z853 Personal history of malignant neoplasm of breast: Secondary | ICD-10-CM

## 2015-05-06 DIAGNOSIS — Z720 Tobacco use: Secondary | ICD-10-CM | POA: Diagnosis not present

## 2015-05-06 DIAGNOSIS — J441 Chronic obstructive pulmonary disease with (acute) exacerbation: Secondary | ICD-10-CM | POA: Diagnosis present

## 2015-05-06 DIAGNOSIS — R0789 Other chest pain: Secondary | ICD-10-CM | POA: Diagnosis present

## 2015-05-06 DIAGNOSIS — Z880 Allergy status to penicillin: Secondary | ICD-10-CM | POA: Diagnosis not present

## 2015-05-06 DIAGNOSIS — Z7952 Long term (current) use of systemic steroids: Secondary | ICD-10-CM

## 2015-05-06 DIAGNOSIS — R000 Tachycardia, unspecified: Secondary | ICD-10-CM

## 2015-05-06 DIAGNOSIS — J9622 Acute and chronic respiratory failure with hypercapnia: Secondary | ICD-10-CM | POA: Diagnosis not present

## 2015-05-06 DIAGNOSIS — J9621 Acute and chronic respiratory failure with hypoxia: Secondary | ICD-10-CM

## 2015-05-06 DIAGNOSIS — Z7982 Long term (current) use of aspirin: Secondary | ICD-10-CM

## 2015-05-06 DIAGNOSIS — F17200 Nicotine dependence, unspecified, uncomplicated: Secondary | ICD-10-CM

## 2015-05-06 DIAGNOSIS — J9602 Acute respiratory failure with hypercapnia: Principal | ICD-10-CM | POA: Diagnosis present

## 2015-05-06 DIAGNOSIS — Z79899 Other long term (current) drug therapy: Secondary | ICD-10-CM | POA: Diagnosis not present

## 2015-05-06 DIAGNOSIS — Z9981 Dependence on supplemental oxygen: Secondary | ICD-10-CM | POA: Diagnosis not present

## 2015-05-06 DIAGNOSIS — J439 Emphysema, unspecified: Secondary | ICD-10-CM | POA: Diagnosis not present

## 2015-05-06 DIAGNOSIS — F17210 Nicotine dependence, cigarettes, uncomplicated: Secondary | ICD-10-CM | POA: Diagnosis present

## 2015-05-06 DIAGNOSIS — J9600 Acute respiratory failure, unspecified whether with hypoxia or hypercapnia: Secondary | ICD-10-CM

## 2015-05-06 DIAGNOSIS — J9801 Acute bronchospasm: Secondary | ICD-10-CM | POA: Diagnosis present

## 2015-05-06 DIAGNOSIS — J9620 Acute and chronic respiratory failure, unspecified whether with hypoxia or hypercapnia: Secondary | ICD-10-CM | POA: Diagnosis present

## 2015-05-06 LAB — BASIC METABOLIC PANEL
ANION GAP: 5 (ref 5–15)
BUN: 15 mg/dL (ref 6–20)
CO2: 26 mmol/L (ref 22–32)
Calcium: 8.5 mg/dL — ABNORMAL LOW (ref 8.9–10.3)
Chloride: 103 mmol/L (ref 101–111)
Creatinine, Ser: 0.51 mg/dL (ref 0.44–1.00)
GFR calc non Af Amer: >60 mL/min (ref >60)
Glucose, Bld: 98 mg/dL (ref 65–99)
Potassium: 3.9 mmol/L (ref 3.5–5.1)
SODIUM: 134 mmol/L — AB (ref 135–145)

## 2015-05-06 LAB — BLOOD GAS, ARTERIAL
Acid-Base Excess: 1.3 mmol/L (ref 0.0–3.0)
Allens test (pass/fail): POSITIVE — AB
BICARBONATE: 28.7 meq/L — AB (ref 21.0–28.0)
Delivery systems: POSITIVE
EXPIRATORY PAP: 6
FIO2: 0.3
INSPIRATORY PAP: 12
O2 Saturation: 96.3 %
PCO2 ART: 57 mmHg — AB (ref 32.0–48.0)
PO2 ART: 92 mmHg (ref 83.0–108.0)
Patient temperature: 37
pH, Arterial: 7.31 — ABNORMAL LOW (ref 7.350–7.450)

## 2015-05-06 LAB — CBC WITH DIFFERENTIAL/PLATELET
Basophils Absolute: 0 10*3/uL (ref 0–0.1)
Basophils Relative: 0 %
EOS ABS: 0 10*3/uL (ref 0–0.7)
EOS PCT: 0 %
HCT: 35.9 % (ref 35.0–47.0)
Hemoglobin: 12 g/dL (ref 12.0–16.0)
LYMPHS ABS: 0.7 10*3/uL — AB (ref 1.0–3.6)
LYMPHS PCT: 7 %
MCH: 29.5 pg (ref 26.0–34.0)
MCHC: 33.4 g/dL (ref 32.0–36.0)
MCV: 88.4 fL (ref 80.0–100.0)
MONO ABS: 0.3 10*3/uL (ref 0.2–0.9)
MONOS PCT: 2 %
Neutro Abs: 9.9 10*3/uL — ABNORMAL HIGH (ref 1.4–6.5)
Neutrophils Relative %: 91 %
PLATELETS: 272 10*3/uL (ref 150–440)
RBC: 4.06 MIL/uL (ref 3.80–5.20)
RDW: 12.5 % (ref 11.5–14.5)
WBC: 10.9 10*3/uL (ref 3.6–11.0)

## 2015-05-06 LAB — GLUCOSE, CAPILLARY: Glucose-Capillary: 144 mg/dL — ABNORMAL HIGH (ref 65–99)

## 2015-05-06 LAB — BRAIN NATRIURETIC PEPTIDE: B NATRIURETIC PEPTIDE 5: 17 pg/mL (ref 0.0–100.0)

## 2015-05-06 LAB — TROPONIN I: Troponin I: <0.03 ng/mL (ref <0.031)

## 2015-05-06 LAB — MRSA PCR SCREENING: MRSA by PCR: NEGATIVE

## 2015-05-06 MED ORDER — CETYLPYRIDINIUM CHLORIDE 0.05 % MT LIQD
7.0000 mL | Freq: Two times a day (BID) | OROMUCOSAL | Status: DC
  Administered 2015-05-07 – 2015-05-12 (×8): 7 mL via OROMUCOSAL

## 2015-05-06 MED ORDER — SODIUM CHLORIDE 0.9 % IV SOLN: 250.0000 mL | INTRAVENOUS | Status: DC | PRN

## 2015-05-06 MED ORDER — SODIUM CHLORIDE 0.9 % IV BOLUS (SEPSIS)
500.0000 mL | Freq: Once | INTRAVENOUS | Status: AC
Start: 2015-05-06 — End: 2015-05-06
  Administered 2015-05-06: 500 mL via INTRAVENOUS

## 2015-05-06 MED ORDER — CHLORHEXIDINE GLUCONATE 0.12 % MT SOLN
15.0000 mL | Freq: Two times a day (BID) | OROMUCOSAL | Status: DC
  Administered 2015-05-06 – 2015-05-14 (×15): 15 mL via OROMUCOSAL
  Filled 2015-05-06 (×14): qty 15

## 2015-05-06 MED ORDER — METHYLPREDNISOLONE SODIUM SUCC 125 MG IJ SOLR
80.0000 mg | Freq: Two times a day (BID) | INTRAMUSCULAR | Status: DC
  Administered 2015-05-06 – 2015-05-07 (×2): 80 mg via INTRAVENOUS
  Filled 2015-05-06 (×2): qty 2

## 2015-05-06 MED ORDER — IPRATROPIUM-ALBUTEROL 0.5-2.5 (3) MG/3ML IN SOLN
3.0000 mL | Freq: Once | RESPIRATORY_TRACT | Status: AC
  Administered 2015-05-06: 3 mL via RESPIRATORY_TRACT
  Filled 2015-05-06: qty 3

## 2015-05-06 MED ORDER — ENOXAPARIN SODIUM 40 MG/0.4ML ~~LOC~~ SOLN
40.0000 mg | SUBCUTANEOUS | Status: DC
  Administered 2015-05-06 – 2015-05-13 (×8): 40 mg via SUBCUTANEOUS
  Filled 2015-05-06 (×8): qty 0.4

## 2015-05-06 MED ORDER — ENOXAPARIN SODIUM 40 MG/0.4ML ~~LOC~~ SOLN: 40.0000 mg | SUBCUTANEOUS | Status: DC

## 2015-05-06 MED ORDER — BUDESONIDE 0.25 MG/2ML IN SUSP: 0.2500 mg | Freq: Four times a day (QID) | RESPIRATORY_TRACT | Status: DC

## 2015-05-06 MED ORDER — ONDANSETRON HCL 4 MG/2ML IJ SOLN: 4.0000 mg | Freq: Four times a day (QID) | INTRAMUSCULAR | Status: DC | PRN

## 2015-05-06 MED ORDER — ASPIRIN 325 MG PO TABS
325.0000 mg | ORAL_TABLET | Freq: Every day | ORAL | Status: DC
  Administered 2015-05-07 – 2015-05-08 (×2): 325 mg via ORAL
  Filled 2015-05-06 (×4): qty 1

## 2015-05-06 MED ORDER — ACETAMINOPHEN 325 MG PO TABS: 650.0000 mg | ORAL_TABLET | ORAL | Status: DC | PRN

## 2015-05-06 MED ORDER — LEVALBUTEROL HCL 0.63 MG/3ML IN NEBU: 0.6300 mg | INHALATION_SOLUTION | RESPIRATORY_TRACT | Status: DC | PRN

## 2015-05-06 MED ORDER — LORAZEPAM 2 MG/ML IJ SOLN
0.5000 mg | INTRAMUSCULAR | Status: DC | PRN
  Administered 2015-05-06: 0.5 mg via INTRAVENOUS
  Administered 2015-05-07 – 2015-05-12 (×7): 1 mg via INTRAVENOUS
  Administered 2015-05-13: 0.5 mg via INTRAVENOUS
  Administered 2015-05-14: 1 mg via INTRAVENOUS
  Filled 2015-05-06 (×10): qty 1

## 2015-05-06 MED ORDER — ALBUTEROL SULFATE (2.5 MG/3ML) 0.083% IN NEBU
10.0000 mg/h | INHALATION_SOLUTION | RESPIRATORY_TRACT | Status: DC
  Administered 2015-05-06: 10 mg/h via RESPIRATORY_TRACT
  Filled 2015-05-06: qty 9
  Filled 2015-05-06: qty 3

## 2015-05-06 MED ORDER — DOXYCYCLINE HYCLATE 100 MG IV SOLR
100.0000 mg | Freq: Two times a day (BID) | INTRAVENOUS | Status: DC
  Administered 2015-05-06 – 2015-05-07 (×2): 100 mg via INTRAVENOUS
  Filled 2015-05-06 (×3): qty 100

## 2015-05-06 MED ORDER — IPRATROPIUM BROMIDE 0.02 % IN SOLN
0.5000 mg | Freq: Four times a day (QID) | RESPIRATORY_TRACT | Status: DC
  Administered 2015-05-06 – 2015-05-07 (×3): 0.5 mg via RESPIRATORY_TRACT
  Filled 2015-05-06 (×3): qty 2.5

## 2015-05-06 MED ORDER — QUETIAPINE FUMARATE 25 MG PO TABS
50.0000 mg | ORAL_TABLET | Freq: Every day | ORAL | Status: DC
  Administered 2015-05-06 – 2015-05-13 (×7): 50 mg via ORAL
  Filled 2015-05-06 (×7): qty 2

## 2015-05-06 MED ORDER — LEVALBUTEROL HCL 0.63 MG/3ML IN NEBU
0.6300 mg | INHALATION_SOLUTION | Freq: Four times a day (QID) | RESPIRATORY_TRACT | Status: DC
  Administered 2015-05-06 – 2015-05-07 (×2): 0.63 mg via RESPIRATORY_TRACT
  Filled 2015-05-06 (×2): qty 3

## 2015-05-06 MED ORDER — LACTATED RINGERS IV SOLN
INTRAVENOUS | Status: DC
  Administered 2015-05-06 – 2015-05-13 (×7): via INTRAVENOUS

## 2015-05-06 NOTE — ED Notes (Signed)
Pt bib EMS w/ c/o resp distress.  Per EMS, pt experiencing SOB x 2 days gradually worsening.  Pt denies pain.  Per EMS, pt hx of COPD and BiPAP use.  Pt took 3 duonebs at home and took 60 mg predinsone.  Pt received 125 mg solumedrol via EMS.  Pt wears 2L O2 at home prn.

## 2015-05-06 NOTE — ED Provider Notes (Addendum)
-----------------------------------------   3:55 PM on 05/06/2015 -----------------------------------------  Signed out to me. Pt with copd flare was on bipap here. We did attempt a trial without. He has accessory muscle use pursed lip breathing and is not doing well with that. We will place her back on BiPAP and she consents to this. Patient will still require admission to the hospital.  ----------------------------------------- 4:37 PM on 05/06/2015 -----------------------------------------  Patient much workup on the BiPAP getting continuous nebulizer lungs still tight, but breathing comfortably on BiPAP. Discussed with the intensivist, they will admit her to the unit and agree with workup  Schuyler Amor, MD 05/06/15 [place], MD 05/06/15 [id number]

## 2015-05-06 NOTE — ED Notes (Addendum)
Pt had incr resp effort when up to toilet.  Pt began to feel anxious, had accessory muscle use and incr HR.  Pt given ice, fanned and resp decr.

## 2015-05-06 NOTE — ED Provider Notes (Signed)
[facility] Emergency Department Provider Note   ____________________________________________  Time seen: I have reviewed the triage vital signs and the triage nursing note.  HISTORY  Chief Complaint Respiratory Distress   Historian Patient upon ED arrivalPI [name] [name] is a 55 y.o. female with history of COPD, is here for failed outpatient COPD exacerbation. She has home O2, but typically only wears at night 2 L, none during the day. She is reporting coughing and wheezing, no sputum production. No fevers. She states that she was told at one point had a history of fluid on the lungs, but then alternatively she was told that no in fact there was no fluid on her lungs. She denies a history of known coronary artery disease or cardiac history.    started several days ago, and she was seen in urgent care 2 days ago and started on prednisone and has been continuing embolized treatments. This morning she could not catch her breath despite 2 treatments at home.  She was brought in by CPAP and states that that helps her a lot.  PCP is Dr. Rebeca Alert    Past Medical History  Diagnosis Date  . COPD (chronic obstructive pulmonary disease) ([place])   . Cancer ([place])   . Breast cancer [facility]     Patient Active Problem List   Diagnosis Date Noted  . Acute on chronic respiratory failure ([place]) 07/15/2014  . COPD exacerbation ([place]) 07/15/2014  . Acute bronchitis 07/15/2014  . Tachycardia 07/15/2014    Past Surgical History  Procedure Laterality Date  . Abdominal hysterectomy      Current Outpatient Rx  Name  Route  Sig  Dispense  Refill  . albuterol (PROVENTIL HFA;VENTOLIN HFA) 108 (90 Base) MCG/ACT inhaler   Inhalation   Inhale 2 puffs into the lungs every 6 (six) hours as needed for wheezing or shortness of breath.   1 Inhaler   0   . aspirin EC 81 MG EC tablet   Oral   Take 1 tablet (81 mg total) by mouth daily.   30 tablet   1   .  budesonide-formoterol (SYMBICORT) 160-4.5 MCG/ACT inhaler   Inhalation   Inhale 2 puffs into the lungs 2 (two) times daily.   1 Inhaler   2   . guaiFENesin (MUCINEX) 600 MG 12 hr tablet   Oral   Take 1 tablet (600 mg total) by mouth 2 (two) times daily. Patient taking differently: Take 600 mg by mouth 2 (two) times daily as needed for cough or to loosen phlegm.    20 tablet   0   . ipratropium (ATROVENT HFA) 17 MCG/ACT inhaler   Inhalation   Inhale 1 puff into the lungs 3 (three) times daily.   1 Inhaler   2   . ipratropium-albuterol (DUONEB) 0.5-2.5 (3) MG/3ML SOLN   Nebulization   Take 3 mLs by nebulization every 4 (four) hours as needed (for wheezing/shortness of breath).         Marland Kitchen levofloxacin (LEVAQUIN) 750 MG tablet   Oral   Take 1 tablet (750 mg total) by mouth daily.   5 tablet   0   . montelukast (SINGULAIR) 10 MG tablet   Oral   Take 1 tablet (10 mg total) by mouth at bedtime. Patient taking differently: Take 10 mg by mouth every morning.    30 tablet   2   . predniSONE (DELTASONE) 20 MG tablet   Oral   Take 3 tablets (60 mg  total) by mouth daily.   15 tablet   0   . QUEtiapine (SEROQUEL) 50 MG tablet   Oral   Take 1 tablet (50 mg total) by mouth at bedtime.   30 tablet   02   . tiotropium (SPIRIVA) 18 MCG inhalation capsule   Inhalation   Place 1 capsule (18 mcg total) into inhaler and inhale daily.   30 capsule   2     Allergies Penicillins  No family history on file.  Social History Social History  Substance Use Topics  . Smoking status: Current Some Day Smoker  . Smokeless tobacco: None  . Alcohol Use: No    Review of Systems  Constitutional: Negative for fever. Eyes: Negative for visual changes. ENT: Negative for sore throat. Cardiovascular: Negative for chest pain. Respiratory:Positive for shortness of breath. Gastrointestinal: Negative for abdominal pain, vomiting and diarrhea. Genitourinary: Negative for  dysuria. Musculoskeletal: Negative for back pain. Skin: Negative for rash. Neurological: Negative for headache. 10 point Review of Systems otherwise negative ____________________________________________   PHYSICAL EXAM:  VITAL SIGNS: ED Triage Vitals  Enc Vitals Group     BP 05/06/15 1430 119/62 mmHg     Pulse Rate 05/06/15 1417 105     Resp 05/06/15 1417 25     Temp 05/06/15 1417 97.9 F (36.6 C)     Temp Source 05/06/15 1417 Oral     SpO2 05/06/15 1419 96 %     Weight 05/06/15 1417 125 lb (56.7 kg)     Height --      Head Cir --      Peak Flow --      Pain Score 05/06/15 1419 0     Pain Loc --      Pain Edu? --      Excl. in [place]? --      Constitutional: Alert and oriented. Able speak in short sentences. Moderate respiratory distress.Marland Kitchen HEENT   Head: Normocephalic and atraumatic.      Eyes: Conjunctivae are normal. PERRL. Normal extraocular movements.      Ears:         Nose: No congestion/rhinnorhea.   Mouth/Throat: Mucous membranes are moist.   Neck: No stridor. Cardiovascular/Chest: Tachycardic, regular rhythm.  No murmurs, rubs, or gallops. Respiratory: speaking only in short sentences. Dyspneic. End expiratory wheezing in all fields. No rhonchi or rales  Gastrointestinal: Soft. No distention, no guarding, no rebound. Nontender.    Genitourinary/rectal:Deferred Musculoskeletal: Nontender with normal range of motion in all extremities. No joint effusions.  No lower extremity tenderness.  No edema. Neurologic:  Normal speech and language. No gross or focal neurologic deficits are appreciated. Skin:  Skin is warm, dry and intact. No rash noted. Psychiatric: Mood and affect are normal. Speech and behavior are normal. Patient exhibits appropriate insight and judgment.  ____________________________________________   EKG I, Lisa Roca, MD, the attending physician have personally viewed and interpreted all ECGs.  104 bpm. Sinus tachycardia. Nares respiratory  normal axis. Nonspecific T-wave  ______________________________________  LABS (pertinent positives/negatives )__________________  Metabolic panel significant for sodium 134 otherwise without significant abnormalities BNP pending Troponin less than 0.03 CBC pending _______________________    RADIOLOGY All Xrays were viewed by me. Imaging interpreted by Radiologist. chest 1 view:  IMPRESSION: 1. Stable chest x-ray. No acute findings. No evidence of pneumonia. 2. Hyperexpanded lungs indicating COPD/emphysema. Stable scarring and/or atelectasis within each lung. __________________________________________  PROCEDURES  Procedure(s) performed: None  Critical Care performed: CRITICAL CARE Performed by: Lisa Roca  Total critical care time: 30 minutes  Critical care time was exclusive of separately billable procedures and treating other patients.  Critical care was necessary to treat or prevent imminent or life-threatening deterioration.  Critical care was time spent personally by me on the following activities: development of treatment plan with patient and/or surrogate as well as nursing, discussions with consultants, evaluation of patient's response to treatment, examination of patient, obtaining history from patient or surrogate, ordering and performing treatments and interventions, ordering and review of laboratory studies, ordering and review of radiographic studies, pulse oximetry and re-evaluation of patient's condition.   ____________________________________________   ED COURSE / ASSESSMENT AND PLAN  Pertinent labs & imaging results that were available during my care of the patient were reviewed by me and considered in my medical decision making (see chart for details).    this patient arrived on CPAP with EMS with complaint of dyspnea/wheezing. Clinically consistent with COPD exacerbation. She's been on outpatient treatment with prednisone and DuoNeb's for 2  days, and so she has essentially failed outpatient therapy.  This patient does have a history of prior hospitalizations due to COPD exacerbation.  Is unable to try her room air sat with her because she was in respiratory distress. She is placed right onto BiPAP.  I spoke with the ICU physician on-call, who wanted patient to be observed for longer period of time in order to find out if she could come off of BiPAP in which case she would need hospitalist admission to a floor bed.  In terms of other sources of dyspnea, she does not have symptoms that make me concerned about PE. No chest pain or concern for CHF.    Patient care in the ED transferred to oncoming physician Dr. Burlene Arnt at shift change 4 PM. Labs pending, and I anticipate patient admission to the hospital, likely ICU on BiPAP, but possibly hospitalist for she is able to come off BiPAP.   CONSULTATIONS:  phone discussion with ICU physician.ient / Family / Caregiver informed of clinical course, medical decision-making process, and agree with plan.     ___________________________________________   FINAL CLINICAL IMPRESSION(S) / ED DIAGNOSES   Final diagnoses:  COPD exacerbation ([place])              Note: This dictation was prepared with Diplomatic Services operational officer dictation. Any transcriptional errors that result from this process are unintentional   Lisa Roca, MD 05/06/15 1546

## 2015-05-06 NOTE — H&P (Addendum)
PULMONARY / CRITICAL CARE MEDICINE   Name: [name] [name] MRN: [id number] DOB: [birth year]    ADMISSION DATE:  05/06/2015  PT PROFILE:  86 F smoker with severe emphysema admitted with acute hypercarbic respiratory failure due to COPD exacerbation requiring BiPAP and ICU admission   MAJOR EVENTS/TEST RESULTS:   INDWELLING DEVICES::   MICRO DATA: MRSA PCR 04/02 >> NEG  ANTIMICROBIALS:  Doxycycline 04/02 >>    HISTORY OF PRESENT ILLNESS:   The pt is on BiPAP and therefore history is difficult to obtain. She was seen in ED 03/31 with 4-5 days of increased dyspnea and treated as AECOPD. She improved minimally with outpt treatment and returned to the ED on the day of admission with several hours of respiratory distress. She has been treated with nebulized bronchodilators and BiPAP. She denies fever, purulent sputum, hemoptysis, LE edema and calf tenderness. She does report chest tightness, no anginal or pleuritic CP. She smoked her last cigarette 3 days PTA and has been using nicotine replacement products  PAST MEDICAL HISTORY :  She  has a past medical history of COPD (chronic obstructive pulmonary disease) ([place]); Cancer [facility]); and Breast cancer ([place]).  PAST SURGICAL HISTORY: She  has past surgical history that includes Abdominal hysterectomy.  Allergies  Allergen Reactions  . Penicillins Swelling, Rash and Other (See Comments)    Pt states that she had tongue swelling.   Has patient had a PCN reaction causing immediate rash, facial/tongue/throat swelling, SOB or lightheadedness with hypotension: Yes Has patient had a PCN reaction causing severe rash involving mucus membranes or skin necrosis: No Has patient had a PCN reaction that required hospitalization No Has patient had a PCN reaction occurring within the last 10 years: No If all of the above answers are "NO", then may proceed with Cephalosporin use.    No current facility-administered medications on file prior to  encounter.   Current Outpatient Prescriptions on File Prior to Encounter  Medication Sig  . albuterol (PROVENTIL HFA;VENTOLIN HFA) 108 (90 Base) MCG/ACT inhaler Inhale 2 puffs into the lungs every 6 (six) hours as needed for wheezing or shortness of breath.  Marland Kitchen aspirin EC 81 MG EC tablet Take 1 tablet (81 mg total) by mouth daily.  . budesonide-formoterol (SYMBICORT) 160-4.5 MCG/ACT inhaler Inhale 2 puffs into the lungs 2 (two) times daily.  Marland Kitchen guaiFENesin (MUCINEX) 600 MG 12 hr tablet Take 1 tablet (600 mg total) by mouth 2 (two) times daily. (Patient taking differently: Take 600 mg by mouth 2 (two) times daily as needed for cough or to loosen phlegm. )  . ipratropium (ATROVENT HFA) 17 MCG/ACT inhaler Inhale 1 puff into the lungs 3 (three) times daily.  Marland Kitchen ipratropium-albuterol (DUONEB) 0.5-2.5 (3) MG/3ML SOLN Take 3 mLs by nebulization every 4 (four) hours as needed (for wheezing/shortness of breath).  Marland Kitchen levofloxacin (LEVAQUIN) 750 MG tablet Take 1 tablet (750 mg total) by mouth daily.  . montelukast (SINGULAIR) 10 MG tablet Take 1 tablet (10 mg total) by mouth at bedtime. (Patient taking differently: Take 10 mg by mouth every morning. )  . predniSONE (DELTASONE) 20 MG tablet Take 3 tablets (60 mg total) by mouth daily.  . QUEtiapine (SEROQUEL) 50 MG tablet Take 1 tablet (50 mg total) by mouth at bedtime.  Marland Kitchen tiotropium (SPIRIVA) 18 MCG inhalation capsule Place 1 capsule (18 mcg total) into inhaler and inhale daily.    FAMILY HISTORY:  Her indicated that her mother is deceased. She indicated that her father is alive.  SOCIAL HISTORY: She  reports that she has been smoking.  She does not have any smokeless tobacco history on file. She reports that she does not drink alcohol or use illicit drugs.  REVIEW OF SYSTEMS:   As per HPI. No abdominal symptoms  SUBJECTIVE:    VITAL SIGNS: BP 120/52 mmHg  Pulse 119  Temp(Src) 97.5 F (36.4 C) (Axillary)  Resp 26  Ht 5\' 2"  (1.575 m)  Wt 55.6 kg  (122 lb 9.2 oz)  BMI 22.41 kg/m2  SpO2 98%  HEMODYNAMICS:    VENTILATOR SETTINGS:    INTAKE / OUTPUT: I/O last 3 completed shifts: In: 259.2 [I.V.:9.2; IV Piggyback:250] Out: 300 [Urine:300]  PHYSICAL EXAMINATION: General: Thin, anxious, moderately labored on BiPAP Neuro: CNs intact, motor and sensory intact, DTRs symmetric HEENT: NCAT, sclera white Cardiovascular: Tachy, regular, no M Lungs: hyperresonant to percussion, diffuse prolonged wheezes Abdomen: soft, NT, + BS Ext: warm, no edema  LABS:  BMET  Recent Labs Lab 05/04/15 1619 05/06/15 1418  NA 135 134*  K 3.9 3.9  CL 104 103  CO2 26 26  BUN 14 15  CREATININE 0.60 0.51  GLUCOSE 100* 98    Electrolytes  Recent Labs Lab 05/04/15 1619 05/06/15 1418  CALCIUM 8.7* 8.5*    CBC  Recent Labs Lab 05/04/15 1619 05/06/15 1418  WBC 9.0 10.9  HGB 12.4 12.0  HCT 36.8 35.9  PLT 293 272    Coag's No results for input(s): APTT, INR in the last 168 hours.  Sepsis Markers No results for input(s): LATICACIDVEN, PROCALCITON, O2SATVEN in the last 168 hours.  ABG  Recent Labs Lab 05/06/15 1637  PHART 7.31*  PCO2ART 57*  PO2ART 92    Liver Enzymes No results for input(s): AST, ALT, ALKPHOS, BILITOT, ALBUMIN in the last 168 hours.  Cardiac Enzymes  Recent Labs Lab 05/04/15 1619 05/06/15 1418  TROPONINI <0.03 <0.03    Glucose No results for input(s): GLUCAP in the last 168 hours.  CXR: hyperinflated, NAD     ASSESSMENT / PLAN:  PULMONARY A: Acute hypercarbic respiratory failure Severe emphysema AECOPD with acute bronchospasm High risk of intubation Smoker P:   PRN BiPAP Systemic steroids Nebulized steroids and bronchodilators - using levalbuterol due to tachycardia Supplemental O2 Counseled re: smoking cessation  CARDIOVASCULAR A: Sinus tachycardia - reactive P:  Monitor Avoid beta blockade while acutely bronchospastic  RENAL A:   No issues P:   Monitor BMET  intermittently Monitor I/Os Correct electrolytes as indicated Maintenance IVFs until able to take POs  GASTROINTESTINAL A:   No issues P:   SUP: N/I unless intubated NPO until risk of intubation subsides  HEMATOLOGIC A:   No issues P:  DVT px: LMWH Monitor CBC intermittently Transfuse per usual guidelines  INFECTIOUS A:   COPD exacerbation - empiric treatment for bronchitis P:   Monitor temp, WBC count Micro and abx as above  ENDOCRINE A:   Risk of hyperglycemia while on systemic steroids P:   Monitor CBGs q 8 hrs Consider SSI for glu > 180  NEUROLOGIC A:   Severe anxiety Chronic low dose lorazepam use P:   RASS goal: 0 Low dose PRN Janalyn Rouse, MD PCCM service [place] [phone] Pager [phone] 05/06/2015    05/06/2015, 7:55 PM

## 2015-05-07 ENCOUNTER — Inpatient Hospital Stay: Payer: Medicare Other

## 2015-05-07 DIAGNOSIS — F419 Anxiety disorder, unspecified: Secondary | ICD-10-CM

## 2015-05-07 LAB — CBC
HCT: 34.7 % — ABNORMAL LOW (ref 35.0–47.0)
HEMOGLOBIN: 11.6 g/dL — AB (ref 12.0–16.0)
MCH: 28.7 pg (ref 26.0–34.0)
MCHC: 33.4 g/dL (ref 32.0–36.0)
MCV: 85.7 fL (ref 80.0–100.0)
Platelets: 247 10*3/uL (ref 150–440)
RBC: 4.04 MIL/uL (ref 3.80–5.20)
RDW: 12.5 % (ref 11.5–14.5)
WBC: 6.3 10*3/uL (ref 3.6–11.0)

## 2015-05-07 LAB — BASIC METABOLIC PANEL
ANION GAP: 2 — AB (ref 5–15)
BUN: 11 mg/dL (ref 6–20)
CHLORIDE: 105 mmol/L (ref 101–111)
CO2: 30 mmol/L (ref 22–32)
CREATININE: 0.54 mg/dL (ref 0.44–1.00)
Calcium: 8.9 mg/dL (ref 8.9–10.3)
GFR calc non Af Amer: >60 mL/min (ref >60)
Glucose, Bld: 133 mg/dL — ABNORMAL HIGH (ref 65–99)
POTASSIUM: 3.5 mmol/L (ref 3.5–5.1)
SODIUM: 137 mmol/L (ref 135–145)

## 2015-05-07 LAB — GLUCOSE, CAPILLARY: GLUCOSE-CAPILLARY: 123 mg/dL — AB (ref 65–99)

## 2015-05-07 LAB — MAGNESIUM: Magnesium: 1.9 mg/dL (ref 1.7–2.4)

## 2015-05-07 LAB — PHOSPHORUS: Phosphorus: 3.5 mg/dL (ref 2.5–4.6)

## 2015-05-07 MED ORDER — LEVALBUTEROL HCL 0.63 MG/3ML IN NEBU
0.6300 mg | INHALATION_SOLUTION | Freq: Four times a day (QID) | RESPIRATORY_TRACT | Status: DC
  Administered 2015-05-07: 0.63 mg via RESPIRATORY_TRACT
  Filled 2015-05-07: qty 3

## 2015-05-07 MED ORDER — DOXYCYCLINE HYCLATE 100 MG PO TABS
100.0000 mg | ORAL_TABLET | Freq: Two times a day (BID) | ORAL | Status: DC
  Administered 2015-05-08 (×2): 100 mg via ORAL
  Filled 2015-05-07 (×2): qty 1

## 2015-05-07 MED ORDER — MORPHINE SULFATE (PF) 2 MG/ML IV SOLN
1.0000 mg | INTRAVENOUS | Status: DC | PRN
  Administered 2015-05-07 – 2015-05-12 (×5): 1 mg via INTRAVENOUS
  Filled 2015-05-07 (×6): qty 1

## 2015-05-07 MED ORDER — BUDESONIDE 0.25 MG/2ML IN SUSP: 0.2500 mg | Freq: Four times a day (QID) | RESPIRATORY_TRACT | Status: DC

## 2015-05-07 MED ORDER — BUDESONIDE 0.5 MG/2ML IN SUSP
0.5000 mg | Freq: Two times a day (BID) | RESPIRATORY_TRACT | Status: DC
  Administered 2015-05-07 – 2015-05-13 (×12): 0.5 mg via RESPIRATORY_TRACT
  Filled 2015-05-07 (×10): qty 2

## 2015-05-07 MED ORDER — IPRATROPIUM-ALBUTEROL 0.5-2.5 (3) MG/3ML IN SOLN
3.0000 mL | RESPIRATORY_TRACT | Status: DC
  Administered 2015-05-07 – 2015-05-11 (×24): 3 mL via RESPIRATORY_TRACT
  Filled 2015-05-07 (×23): qty 3

## 2015-05-07 MED ORDER — ALPRAZOLAM 0.25 MG PO TABS
0.5000 mg | ORAL_TABLET | Freq: Three times a day (TID) | ORAL | Status: DC | PRN
  Administered 2015-05-07 – 2015-05-14 (×12): 0.5 mg via ORAL
  Filled 2015-05-07 (×5): qty 1
  Filled 2015-05-07: qty 2
  Filled 2015-05-07 (×3): qty 1
  Filled 2015-05-07 (×4): qty 2

## 2015-05-07 MED ORDER — METHYLPREDNISOLONE SODIUM SUCC 40 MG IJ SOLR
40.0000 mg | Freq: Two times a day (BID) | INTRAMUSCULAR | Status: DC
Start: 2015-05-07 — End: 2015-05-10
  Administered 2015-05-08 – 2015-05-10 (×5): 40 mg via INTRAVENOUS
  Filled 2015-05-07 (×5): qty 1

## 2015-05-07 NOTE — Care Management (Addendum)
Patient presents from home shortness of breath and admitted to icu for exac copd  requiring continuous bipap.  Patient lives alone and inquires about in home services.  She had medicaid pcs services and discontinued about one year ago due to  "a serious depression."  Received  services through [facility].  She has chronic home 02 through [facility].  She has had  home health nurse and physical therapy through [facility].  Agreeable to home health nursing, aide, social work and physical therapy if is needed.  Initiating a personal care service referral through [employer].  Wants to change her  attending and has no preference.  CM will provide her a list and or make an initial appointment for her.

## 2015-05-07 NOTE — Plan of Care (Signed)
Problem: Safety: Goal: Ability to remain free from injury will improve Outcome: Completed/Met Date Met:  05/07/15 Pt is showing appropriate understanding of using call light, waiting for assistance, and communicate needs to nursing staff.

## 2015-05-07 NOTE — Progress Notes (Signed)
[place] NOTE  Pharmacy Consult for antibiotic renal dose adjustment   Allergies  Allergen Reactions  . Penicillins Swelling, Rash and Other (See Comments)    Pt states that she had tongue swelling.   Has patient had a PCN reaction causing immediate rash, facial/tongue/throat swelling, SOB or lightheadedness with hypotension: Yes Has patient had a PCN reaction causing severe rash involving mucus membranes or skin necrosis: No Has patient had a PCN reaction that required hospitalization No Has patient had a PCN reaction occurring within the last 10 years: No If all of the above answers are "NO", then may proceed with Cephalosporin use.    Patient Measurements: Height: 5\' 2"  (157.5 cm) Weight: 122 lb 9.2 oz (55.6 kg) IBW/kg (Calculated) : 50.1  Vital Signs: Temp: 98.4 F (36.9 C) (04/03 0800) Temp Source: Oral (04/03 0800) BP: 115/65 mmHg (04/03 1200) Pulse Rate: 93 (04/03 1200)   Intake/Output from previous day: 04/02 0701 - 04/03 0700 In: 1159.2 [I.V.:659.2; IV Piggyback:500] Out: 1000 [Urine:1000] Intake/Output from this shift: Total I/O In: 250 [I.V.:250] Out: 300 [Urine:300]  Labs:  Recent Labs  05/04/15 1619 05/06/15 1418 05/07/15 0312  WBC 9.0 10.9 6.3  HGB 12.4 12.0 11.6*  HCT 36.8 35.9 34.7*  PLT 293 272 247  CREATININE 0.60 0.51 0.54  MG  --   --  1.9  PHOS  --   --  3.5   Estimated Creatinine Clearance: 62.1 mL/min (by C-G formula based on Cr of 0.54).   Microbiology: Recent Results (from the past 720 hour(s))  MRSA PCR Screening     Status: None   Collection Time: 05/06/15  5:43 PM  Result Value Ref Range Status   MRSA by PCR NEGATIVE NEGATIVE Final    Comment:        The GeneXpert MRSA Assay (FDA approved for NASAL specimens only), is one component of a comprehensive MRSA colonization surveillance program. It is not intended to diagnose MRSA infection nor to guide or monitor treatment for MRSA infections.      Assessment: Pharmacy consulted to monitor and adjust antibiotic dose per renal function.  Patient is currently on day #2 of doxycycline 100 mg q12h for COPD exacerbation.  Plan:  Continue doxycycline 100 mg BID. Changed from IV to PO.  Pharmacy will continue to monitor, thank you for the consult.  Lenis Noon, PharmD Clinical Pharmacist 05/07/2015,1:38 PM

## 2015-05-07 NOTE — Progress Notes (Signed)
Pt complaining of tightness in chest and SOB, requesting Bipap back on. Pt given 1 mg morphine for comfort, and xanax to help with anxiety. Pt stated it did not help. Dr.Mungal paged, verbal orders given to modify Bipap orders to say PRN and at Bedtime.   Will continue to assess.

## 2015-05-07 NOTE — Plan of Care (Signed)
Problem: Pain Managment: Goal: General experience of comfort will improve Outcome: Progressing Pt has had no complaints of pain on my shift. Morphine is used for comfort and air hunger.

## 2015-05-07 NOTE — Clinical Social Work Note (Signed)
Nursing placed a consult for patient asking about home health. This will be addressed by the RN CM. Please re-consult CSW if necessary. Shela Leff MSW,LCSW [phone]

## 2015-05-07 NOTE — H&P (Signed)
PULMONARY / CRITICAL CARE MEDICINE   Name: [name] [name] MRN: [id number] DOB: [birth year]    ADMISSION DATE:  05/06/2015  PT PROFILE:  55 F smoker with severe emphysema admitted with acute hypercarbic respiratory failure due to COPD exacerbation requiring BiPAP and ICU admission       MICRO DATA: MRSA PCR 04/02 >> NEG  ANTIMICROBIALS:  Doxycycline 04/02 >>   CC: follow up COPD exacerbation  HISTORY OF PRESENT ILLNESS:   Patient sleepy but arousable this AM, off biPAP, breathing comfortably   No current facility-administered medications on file prior to encounter.   Current Outpatient Prescriptions on File Prior to Encounter  Medication Sig  . albuterol (PROVENTIL HFA;VENTOLIN HFA) 108 (90 Base) MCG/ACT inhaler Inhale 2 puffs into the lungs every 6 (six) hours as needed for wheezing or shortness of breath.  Marland Kitchen aspirin EC 81 MG EC tablet Take 1 tablet (81 mg total) by mouth daily.  . budesonide-formoterol (SYMBICORT) 160-4.5 MCG/ACT inhaler Inhale 2 puffs into the lungs 2 (two) times daily.  Marland Kitchen guaiFENesin (MUCINEX) 600 MG 12 hr tablet Take 1 tablet (600 mg total) by mouth 2 (two) times daily. (Patient taking differently: Take 600 mg by mouth 2 (two) times daily as needed for cough or to loosen phlegm. )  . ipratropium (ATROVENT HFA) 17 MCG/ACT inhaler Inhale 1 puff into the lungs 3 (three) times daily.  Marland Kitchen ipratropium-albuterol (DUONEB) 0.5-2.5 (3) MG/3ML SOLN Take 3 mLs by nebulization every 4 (four) hours as needed (for wheezing/shortness of breath).  Marland Kitchen levofloxacin (LEVAQUIN) 750 MG tablet Take 1 tablet (750 mg total) by mouth daily.  . montelukast (SINGULAIR) 10 MG tablet Take 1 tablet (10 mg total) by mouth at bedtime. (Patient taking differently: Take 10 mg by mouth every morning. )  . predniSONE (DELTASONE) 20 MG tablet Take 3 tablets (60 mg total) by mouth daily.  . QUEtiapine (SEROQUEL) 50 MG tablet Take 1 tablet (50 mg total) by mouth at bedtime.  Marland Kitchen tiotropium  (SPIRIVA) 18 MCG inhalation capsule Place 1 capsule (18 mcg total) into inhaler and inhale daily.       VITAL SIGNS: BP 123/74 mmHg  Pulse 87  Temp(Src) 98.1 F (36.7 C) (Axillary)  Resp 21  Ht 5\' 2"  (1.575 m)  Wt 122 lb 9.2 oz (55.6 kg)  BMI 22.41 kg/m2  SpO2 100%  HEMODYNAMICS:    VENTILATOR SETTINGS: Vent Mode:  [-]  FiO2 (%):  [30 %] 30 %  INTAKE / OUTPUT: I/O last 3 completed shifts: In: 1009.2 [I.V.:509.2; IV Piggyback:500] Out: 1000 [Urine:1000]   Review of Systems  Constitutional: Negative for fever, chills and weight loss.  Eyes: Negative for blurred vision.  Respiratory: Positive for shortness of breath. Negative for cough, hemoptysis, sputum production and wheezing.   Cardiovascular: Negative for chest pain.  Gastrointestinal: Negative for nausea, vomiting and abdominal pain.  Genitourinary: Negative for flank pain.  Musculoskeletal: Negative.   Skin: Negative for rash.  Neurological: Negative for dizziness, tingling, tremors and headaches.  Endo/Heme/Allergies: Negative.   Psychiatric/Behavioral: Negative.   All other systems reviewed and are negative.   PHYSICAL EXAMINATION: General: Thin, anxious, off biPAP Neuro: CNs intact, motor and sensory intact, DTRs symmetric HEENT: NCAT, sclera white Cardiovascular: Tachy, regular, no M Lungs: hyperresonant to percussion, diffuse prolonged wheezes Abdomen: soft, NT, + BS Ext: warm, no edema  LABS:  BMET  Recent Labs Lab 05/04/15 1619 05/06/15 1418 05/07/15 0312  NA 135 134* 137  K 3.9 3.9 3.5  CL 104 103  105  CO2 26 26 30   BUN 14 15 11   CREATININE 0.60 0.51 0.54  GLUCOSE 100* 98 133*    Electrolytes  Recent Labs Lab 05/04/15 1619 05/06/15 1418 05/07/15 0312  CALCIUM 8.7* 8.5* 8.9  MG  --   --  1.9  PHOS  --   --  3.5    CBC  Recent Labs Lab 05/04/15 1619 05/06/15 1418 05/07/15 0312  WBC 9.0 10.9 6.3  HGB 12.4 12.0 11.6*  HCT 36.8 35.9 34.7*  PLT 293 272 247     Coag's No results for input(s): APTT, INR in the last 168 hours.  Sepsis Markers No results for input(s): LATICACIDVEN, PROCALCITON, O2SATVEN in the last 168 hours.  ABG  Recent Labs Lab 05/06/15 1637  PHART 7.31*  PCO2ART 57*  PO2ART 92    Liver Enzymes No results for input(s): AST, ALT, ALKPHOS, BILITOT, ALBUMIN in the last 168 hours.  Cardiac Enzymes  Recent Labs Lab 05/04/15 1619 05/06/15 1418  TROPONINI <0.03 <0.03    Glucose  Recent Labs Lab 05/06/15 2327 05/07/15 0717  GLUCAP 144* 123*    CXR: hyperinflated, NAD     ASSESSMENT / PLAN:  55 yo AAF admitted for acute COPD exacerbation much improved  PULMONARY A: Acute hypercarbic respiratory failure Severe emphysema AECOPD with acute bronchospasm P:   PRN BiPAP Systemic steroids Nebulized steroids and bronchodilators - using levalbuterol due to tachycardia Supplemental O2 Counseled re: smoking cessation  CARDIOVASCULAR A: Sinus tachycardia - reactive P:  Monitor Avoid beta blockade while acutely bronchospastic  RENAL A:   No issues P:   Monitor BMET intermittently Monitor I/Os Correct electrolytes as indicated Maintenance IVFs until able to take POs  GASTROINTESTINAL A:   No issues P:   SUP: N/I unless intubated  HEMATOLOGIC A:   No issues P:  DVT px: LMWH Monitor CBC intermittently Transfuse per usual guidelines  INFECTIOUS A:   COPD exacerbation - empiric treatment for bronchitis P:   Monitor temp, WBC count Micro and abx as above  ENDOCRINE A:   Risk of hyperglycemia while on systemic steroids P:   Monitor CBGs q 8 hrs Consider SSI for glu > 180  NEUROLOGIC A:   Severe anxiety Chronic low dose lorazepam use P:   RASS goal: 0 Low dose PRN lorazpeam Will give try oral xanax and morphine as needed   I have personally obtained a history, examined the patient, evaluated Pertinent laboratory and RadioGraphic/imaging results, and  formulated the  assessment and plan  WiIl make step down status and get IM on board   [name], M.D.  [facility]  Medical Director [place] Director [place] Department

## 2015-05-07 NOTE — Progress Notes (Signed)
Pt currently eating dinner. Pt stating she has some relief after using Bipap, will continue to use tonight. No complaints of pain. Report given to oncoming nurse.

## 2015-05-08 LAB — URINE DRUG SCREEN, QUALITATIVE (ARMC ONLY)
Amphetamines, Ur Screen: NOT DETECTED
BARBITURATES, UR SCREEN: NOT DETECTED
Benzodiazepine, Ur Scrn: POSITIVE — AB
CANNABINOID 50 NG, UR ~~LOC~~: NOT DETECTED
COCAINE METABOLITE, UR ~~LOC~~: NOT DETECTED
MDMA (ECSTASY) UR SCREEN: NOT DETECTED
Methadone Scn, Ur: NOT DETECTED
Opiate, Ur Screen: POSITIVE — AB
PHENCYCLIDINE (PCP) UR S: NOT DETECTED
Tricyclic, Ur Screen: NOT DETECTED

## 2015-05-08 LAB — GLUCOSE, CAPILLARY: Glucose-Capillary: 107 mg/dL — ABNORMAL HIGH (ref 65–99)

## 2015-05-08 MED ORDER — MORPHINE SULFATE (PF) 2 MG/ML IV SOLN
2.0000 mg | INTRAVENOUS | Status: AC
  Administered 2015-05-08: 2 mg via INTRAVENOUS

## 2015-05-08 MED ORDER — DOXYCYCLINE HYCLATE 100 MG IV SOLR
100.0000 mg | Freq: Two times a day (BID) | INTRAVENOUS | Status: AC
  Administered 2015-05-08 – 2015-05-10 (×4): 100 mg via INTRAVENOUS
  Filled 2015-05-08 (×4): qty 100

## 2015-05-08 MED ORDER — DEXMEDETOMIDINE HCL IN NACL 200 MCG/50ML IV SOLN
0.4000 ug/kg/h | INTRAVENOUS | Status: DC
  Administered 2015-05-08 – 2015-05-10 (×3): 0.4 ug/kg/h via INTRAVENOUS
  Filled 2015-05-08 (×4): qty 50
  Filled 2015-05-08: qty 100
  Filled 2015-05-08 (×4): qty 50

## 2015-05-08 NOTE — Progress Notes (Signed)
Pt having increased work of breathing notified Dr Mortimer Fries given orders to administer 2 mg iv push morphine once and to place order for precedex drip maintaining Rass -1

## 2015-05-08 NOTE — Progress Notes (Signed)
[place] NOTE  Pharmacy Consult for antibiotic renal dose adjustment   Allergies  Allergen Reactions  . Penicillins Swelling, Rash and Other (See Comments)    Pt states that she had tongue swelling.   Has patient had a PCN reaction causing immediate rash, facial/tongue/throat swelling, SOB or lightheadedness with hypotension: Yes Has patient had a PCN reaction causing severe rash involving mucus membranes or skin necrosis: No Has patient had a PCN reaction that required hospitalization No Has patient had a PCN reaction occurring within the last 10 years: No If all of the above answers are "NO", then may proceed with Cephalosporin use.    Patient Measurements: Height: 5\' 2"  (157.5 cm) Weight: 122 lb 9.2 oz (55.6 kg) IBW/kg (Calculated) : 50.1  Vital Signs: Temp: 98.3 F (36.8 C) (04/04 0900) Temp Source: Axillary (04/04 0900) BP: 117/72 mmHg (04/04 1200) Pulse Rate: 96 (04/04 1200)   Intake/Output from previous day: 04/03 0701 - 04/04 0700 In: 600 [I.V.:600] Out: 800 [Urine:800] Intake/Output from this shift: Total I/O In: 18.5 [I.V.:18.5] Out: -   Labs:  Recent Labs  05/06/15 1418 05/07/15 0312  WBC 10.9 6.3  HGB 12.0 11.6*  HCT 35.9 34.7*  PLT 272 247  CREATININE 0.51 0.54  MG  --  1.9  PHOS  --  3.5   Estimated Creatinine Clearance: 62.1 mL/min (by C-G formula based on Cr of 0.54).   Microbiology: Recent Results (from the past 720 hour(s))  MRSA PCR Screening     Status: None   Collection Time: 05/06/15  5:43 PM  Result Value Ref Range Status   MRSA by PCR NEGATIVE NEGATIVE Final    Comment:        The GeneXpert MRSA Assay (FDA approved for NASAL specimens only), is one component of a comprehensive MRSA colonization surveillance program. It is not intended to diagnose MRSA infection nor to guide or monitor treatment for MRSA infections.     Assessment: Pharmacy consulted to monitor and adjust antibiotic dose per renal  function.  Plan:  Continue doxycycline 100 mg BID.  Changed back to IV as patient is NPO.   Pharmacy will continue to monitor, thank you for the consult.  Napoleon Form, PharmD Clinical Pharmacist 05/08/2015,2:22 PM

## 2015-05-08 NOTE — Progress Notes (Signed)
Pt has slept most of the day. Dr.Kasa made aware of new ABG results. No new orders given.

## 2015-05-08 NOTE — Progress Notes (Signed)
Pt has slept most of the day today . Remains on Precedex today at 0.4mg /kg/min. Pt voided today. Pt remains on Bipap at 30%, pt attempted to eat breakfast this morning on [place]. Pt's sats dropped to 70's quickly placed back on bipap. Pt remained on bipap rest of the day. No complaints of pain on my shift. Report given to oncoming nurse.

## 2015-05-08 NOTE — H&P (Signed)
PULMONARY / CRITICAL CARE MEDICINE   Name: [name] [name] MRN: [id number] DOB: [birth year]    ADMISSION DATE:  05/06/2015  PT PROFILE:  89 F smoker with severe emphysema admitted with acute hypercarbic respiratory failure due to COPD exacerbation requiring BiPAP and ICU admission       MICRO DATA: MRSA PCR 04/02 >> NEG  ANTIMICROBIALS:  Doxycycline 04/02 >>   CC: follow up COPD exacerbation  HISTORY OF PRESENT ILLNESS:   Increased WOB, b/l wheezing, on bipap High risk for intubation   No current facility-administered medications on file prior to encounter.   Current Outpatient Prescriptions on File Prior to Encounter  Medication Sig  . albuterol (PROVENTIL HFA;VENTOLIN HFA) 108 (90 Base) MCG/ACT inhaler Inhale 2 puffs into the lungs every 6 (six) hours as needed for wheezing or shortness of breath.  Marland Kitchen aspirin EC 81 MG EC tablet Take 1 tablet (81 mg total) by mouth daily.  . budesonide-formoterol (SYMBICORT) 160-4.5 MCG/ACT inhaler Inhale 2 puffs into the lungs 2 (two) times daily.  Marland Kitchen guaiFENesin (MUCINEX) 600 MG 12 hr tablet Take 1 tablet (600 mg total) by mouth 2 (two) times daily. (Patient taking differently: Take 600 mg by mouth 2 (two) times daily as needed for cough or to loosen phlegm. )  . ipratropium (ATROVENT HFA) 17 MCG/ACT inhaler Inhale 1 puff into the lungs 3 (three) times daily.  Marland Kitchen ipratropium-albuterol (DUONEB) 0.5-2.5 (3) MG/3ML SOLN Take 3 mLs by nebulization every 4 (four) hours as needed (for wheezing/shortness of breath).  Marland Kitchen levofloxacin (LEVAQUIN) 750 MG tablet Take 1 tablet (750 mg total) by mouth daily.  . montelukast (SINGULAIR) 10 MG tablet Take 1 tablet (10 mg total) by mouth at bedtime. (Patient taking differently: Take 10 mg by mouth every morning. )  . predniSONE (DELTASONE) 20 MG tablet Take 3 tablets (60 mg total) by mouth daily.  . QUEtiapine (SEROQUEL) 50 MG tablet Take 1 tablet (50 mg total) by mouth at bedtime.  Marland Kitchen tiotropium (SPIRIVA) 18  MCG inhalation capsule Place 1 capsule (18 mcg total) into inhaler and inhale daily.       VITAL SIGNS: BP 121/78 mmHg  Pulse 118  Temp(Src) 98.3 F (36.8 C) (Oral)  Resp 31  Ht 5\' 2"  (1.575 m)  Wt 122 lb 9.2 oz (55.6 kg)  BMI 22.41 kg/m2  SpO2 91%  HEMODYNAMICS:    VENTILATOR SETTINGS:    INTAKE / OUTPUT: I/O last 3 completed shifts: In: 1500 [I.V.:1250; IV Piggyback:250] Out: 1500 [Urine:1500]   Review of Systems  Unable to perform ROS: critical illness  Respiratory: Positive for shortness of breath and wheezing.     PHYSICAL EXAMINATION: General: Thin, anxious, on biPAP, increased WOB, using accessory muscles to breathe Neuro: CNs intact, motor and sensory intact, DTRs symmetric HEENT: NCAT, sclera white Cardiovascular: Tachy, regular, no M Lungs: hyperresonant to percussion, diffuse prolonged wheezes Abdomen: soft, NT, + BS Ext: warm, no edema  LABS:  BMET  Recent Labs Lab 05/04/15 1619 05/06/15 1418 05/07/15 0312  NA 135 134* 137  K 3.9 3.9 3.5  CL 104 103 105  CO2 26 26 30   BUN 14 15 11   CREATININE 0.60 0.51 0.54  GLUCOSE 100* 98 133*    Electrolytes  Recent Labs Lab 05/04/15 1619 05/06/15 1418 05/07/15 0312  CALCIUM 8.7* 8.5* 8.9  MG  --   --  1.9  PHOS  --   --  3.5    CBC  Recent Labs Lab 05/04/15 1619 05/06/15 1418 05/07/15  0312  WBC 9.0 10.9 6.3  HGB 12.4 12.0 11.6*  HCT 36.8 35.9 34.7*  PLT 293 272 247    Coag's No results for input(s): APTT, INR in the last 168 hours.  Sepsis Markers No results for input(s): LATICACIDVEN, PROCALCITON, O2SATVEN in the last 168 hours.  ABG  Recent Labs Lab 05/06/15 1637  PHART 7.31*  PCO2ART 57*  PO2ART 92    Liver Enzymes No results for input(s): AST, ALT, ALKPHOS, BILITOT, ALBUMIN in the last 168 hours.  Cardiac Enzymes  Recent Labs Lab 05/04/15 1619 05/06/15 1418  TROPONINI <0.03 <0.03    Glucose  Recent Labs Lab 05/06/15 2327 05/07/15 0717  GLUCAP  144* 123*    CXR: hyperinflated, NAD     ASSESSMENT / PLAN:  55 yo AAF admitted for acute COPD exacerbation with severe resp distress this AM PULMONARY A: Acute hypercarbic respiratory failure Severe emphysema AECOPD with acute bronchospasm P:   -BiPAP-will start precedex infusion Systemic steroids Nebulized steroids and bronchodilators - using levalbuterol due to tachycardia Supplemental O2 Counseled re: smoking cessation -high risk for intubation  CARDIOVASCULAR A: Sinus tachycardia - reactive P:  Monitor Avoid beta blockade while acutely bronchospastic  RENAL A:   No issues P:   Monitor BMET intermittently Monitor I/Os Correct electrolytes as indicated Maintenance IVFs until able to take POs  GASTROINTESTINAL A:   No issues P:   SUP: N/I unless intubated  HEMATOLOGIC A:   No issues P:  DVT px: LMWH Monitor CBC intermittently Transfuse per usual guidelines  INFECTIOUS A:   COPD exacerbation - empiric treatment for bronchitis P:   Monitor temp, WBC count Micro and abx as above  ENDOCRINE A:   Risk of hyperglycemia while on systemic steroids P:   Monitor CBGs q 8 hrs Consider SSI for glu > 180  NEUROLOGIC A:   Severe anxiety Chronic low dose lorazepam use P:   rass goal -1 Plan for precedex infusion   I have personally obtained a history, examined the patient, evaluated Pertinent laboratory and RadioGraphic/imaging results, and  formulated the assessment and plan   The Patient requires high complexity decision making for assessment and support, frequent evaluation and titration of therapies, application of advanced monitoring technologies and extensive interpretation of multiple databases. Critical Care Time devoted to patient care services described in this note is 35 minutes.   Overall, patient is critically ill, prognosis is guarded.  Patient with Multiorgan failure and at high risk for cardiac arrest and death.  Remains critically  ill  [name], M.D.  [facility]  Medical Director [place] Director [facility] Cardio-Pulmonary Department

## 2015-05-08 NOTE — Plan of Care (Signed)
Problem: Skin Integrity: Goal: Risk for impaired skin integrity will decrease Outcome: Progressing Pt is off ventilator and able to communicate needs to staff. Pt is now able to make small changes in body position and help staff change positions.

## 2015-05-09 LAB — BLOOD GAS, ARTERIAL
Acid-Base Excess: 9.5 mmol/L — ABNORMAL HIGH (ref 0.0–3.0)
Allens test (pass/fail): POSITIVE — AB
BICARBONATE: 37.3 meq/L — AB (ref 21.0–28.0)
Delivery systems: POSITIVE
EXPIRATORY PAP: 6
FIO2: 0.3
Inspiratory PAP: 12
MECHVT: 10 mL
O2 Saturation: 96.3 %
PATIENT TEMPERATURE: 37
PO2 ART: 87 mmHg (ref 83.0–108.0)
pCO2 arterial: 66 mmHg — ABNORMAL HIGH (ref 32.0–48.0)
pH, Arterial: 7.36 (ref 7.350–7.450)

## 2015-05-09 LAB — GLUCOSE, CAPILLARY
GLUCOSE-CAPILLARY: 113 mg/dL — AB (ref 65–99)
GLUCOSE-CAPILLARY: 116 mg/dL — AB (ref 65–99)
GLUCOSE-CAPILLARY: 129 mg/dL — AB (ref 65–99)

## 2015-05-09 NOTE — Progress Notes (Signed)
Patient has rested well on the BiPAP and precedex gtt throughout the night.  There have been no complaints of anxiety, pain, or shortness of breath.  Patient has voided a few times and vital signs have remained stable.  States she feels like she is "breathing much better".  Will continue to monitor.

## 2015-05-09 NOTE — H&P (Signed)
PULMONARY / CRITICAL CARE MEDICINE   Name: [name] [name] MRN: [id number] DOB: Aug 12, 1960    ADMISSION DATE:  05/06/2015  PT PROFILE:  66 F smoker with severe emphysema admitted with acute hypercarbic respiratory failure due to COPD exacerbation requiring BiPAP and ICU admission MICRO DATA: MRSA PCR 04/02 >> NEG  ANTIMICROBIALS:  Doxycycline 04/02 >>   CC: follow up COPD exacerbation  HISTORY OF PRESENT ILLNESS:   Increased WOB, b/l wheezing, remains on bipap High risk for intubation On precedex for severe anxiety   No current facility-administered medications on file prior to encounter.   Current Outpatient Prescriptions on File Prior to Encounter  Medication Sig  . albuterol (PROVENTIL HFA;VENTOLIN HFA) 108 (90 Base) MCG/ACT inhaler Inhale 2 puffs into the lungs every 6 (six) hours as needed for wheezing or shortness of breath.  Marland Kitchen aspirin EC 81 MG EC tablet Take 1 tablet (81 mg total) by mouth daily.  . budesonide-formoterol (SYMBICORT) 160-4.5 MCG/ACT inhaler Inhale 2 puffs into the lungs 2 (two) times daily.  Marland Kitchen guaiFENesin (MUCINEX) 600 MG 12 hr tablet Take 1 tablet (600 mg total) by mouth 2 (two) times daily. (Patient taking differently: Take 600 mg by mouth 2 (two) times daily as needed for cough or to loosen phlegm. )  . ipratropium (ATROVENT HFA) 17 MCG/ACT inhaler Inhale 1 puff into the lungs 3 (three) times daily.  Marland Kitchen ipratropium-albuterol (DUONEB) 0.5-2.5 (3) MG/3ML SOLN Take 3 mLs by nebulization every 4 (four) hours as needed (for wheezing/shortness of breath).  Marland Kitchen levofloxacin (LEVAQUIN) 750 MG tablet Take 1 tablet (750 mg total) by mouth daily.  . montelukast (SINGULAIR) 10 MG tablet Take 1 tablet (10 mg total) by mouth at bedtime. (Patient taking differently: Take 10 mg by mouth every morning. )  . predniSONE (DELTASONE) 20 MG tablet Take 3 tablets (60 mg total) by mouth daily.  . QUEtiapine (SEROQUEL) 50 MG tablet Take 1 tablet (50 mg total) by mouth at bedtime.   Marland Kitchen tiotropium (SPIRIVA) 18 MCG inhalation capsule Place 1 capsule (18 mcg total) into inhaler and inhale daily.       VITAL SIGNS: BP 140/86 mmHg  Pulse 77  Temp(Src) 98.6 F (37 C) (Axillary)  Resp 25  Ht 5\' 2"  (1.575 m)  Wt 122 lb 9.2 oz (55.6 kg)  BMI 22.41 kg/m2  SpO2 97%  HEMODYNAMICS:    VENTILATOR SETTINGS: Vent Mode:  [-]  FiO2 (%):  [30 %] 30 %  INTAKE / OUTPUT: I/O last 3 completed shifts: In: 2132 [I.V.:1882; IV Piggyback:250] Out: 600 [Urine:600]   Review of Systems  Unable to perform ROS: critical illness  Respiratory: Positive for shortness of breath and wheezing.     PHYSICAL EXAMINATION: General: Thin, anxious, on biPAP, increased WOB, using accessory muscles to breathe Neuro: CNs intact, motor and sensory intact, DTRs symmetric HEENT: NCAT, sclera white Cardiovascular: Tachy, regular, no M Lungs: hyperresonant to percussion, diffuse prolonged wheezes Abdomen: soft, NT, + BS Ext: warm, no edema  LABS:  BMET  Recent Labs Lab 05/04/15 1619 05/06/15 1418 05/07/15 0312  NA 135 134* 137  K 3.9 3.9 3.5  CL 104 103 105  CO2 26 26 30   BUN 14 15 11   CREATININE 0.60 0.51 0.54  GLUCOSE 100* 98 133*    Electrolytes  Recent Labs Lab 05/04/15 1619 05/06/15 1418 05/07/15 0312  CALCIUM 8.7* 8.5* 8.9  MG  --   --  1.9  PHOS  --   --  3.5  CBC  Recent Labs Lab 05/04/15 1619 05/06/15 1418 05/07/15 0312  WBC 9.0 10.9 6.3  HGB 12.4 12.0 11.6*  HCT 36.8 35.9 34.7*  PLT 293 272 247    Coag's No results for input(s): APTT, INR in the last 168 hours.  Sepsis Markers No results for input(s): LATICACIDVEN, PROCALCITON, O2SATVEN in the last 168 hours.  ABG  Recent Labs Lab 05/06/15 1637 05/08/15 1200  PHART 7.31* 7.36  PCO2ART 57* 66*  PO2ART 92 87    Liver Enzymes No results for input(s): AST, ALT, ALKPHOS, BILITOT, ALBUMIN in the last 168 hours.  Cardiac Enzymes  Recent Labs Lab 05/04/15 1619 05/06/15 1418   TROPONINI <0.03 <0.03    Glucose  Recent Labs Lab 05/06/15 2327 05/07/15 0717 05/08/15 1631 05/09/15 0831  GLUCAP 144* 123* 107* 113*    CXR: hyperinflated, NAD     ASSESSMENT / PLAN:  55 yo AAF admitted for acute COPD exacerbation with severe resp distress this AM PULMONARY A: Acute hypercarbic respiratory failure Severe emphysema AECOPD with acute bronchospasm P:   -BiPAP-will continue  precedex infusion Systemic steroids Nebulized steroids and bronchodilators - using levalbuterol due to tachycardia Supplemental O2 Counseled re: smoking cessation -high risk for intubation  CARDIOVASCULAR A: Sinus tachycardia - reactive P:  Monitor Avoid beta blockade while acutely bronchospastic  RENAL A:   No issues P:   Monitor BMET intermittently Monitor I/Os Correct electrolytes as indicated Maintenance IVFs until able to take POs  GASTROINTESTINAL A:   No issues P:   SUP: N/I unless intubated  HEMATOLOGIC A:   No issues P:  DVT px: LMWH Monitor CBC intermittently Transfuse per usual guidelines  INFECTIOUS A:   COPD exacerbation - empiric treatment for bronchitis P:   Monitor temp, WBC count Micro and abx as above  ENDOCRINE A:   Risk of hyperglycemia while on systemic steroids P:   Monitor CBGs q 8 hrs Consider SSI for glu > 180  NEUROLOGIC A:   Severe anxiety Chronic low dose lorazepam use P:   rass goal 0 Plan for precedex infusion   I have personally obtained a history, examined the patient, evaluated Pertinent laboratory and RadioGraphic/imaging results, and  formulated the assessment and plan   The Patient requires high complexity decision making for assessment and support, frequent evaluation and titration of therapies, application of advanced monitoring technologies and extensive interpretation of multiple databases. Critical Care Time devoted to patient care services described in this note is 35 minutes.   Overall, patient is  critically ill, prognosis is guarded.  Patient with Multiorgan failure and at high risk for cardiac arrest and death.  Remains critically ill  [name], M.D.  [facility]  Medical Director [place] Director [facility] Cardio-Pulmonary Department

## 2015-05-09 NOTE — Progress Notes (Signed)
Pt is in stable condition at this time. Pt remains on Bipap at 30% and Precedex.  Report given to [place], [place].N.

## 2015-05-09 NOTE — Progress Notes (Signed)
[place] NOTE  Pharmacy Consult for antibiotic renal dose adjustment   Allergies  Allergen Reactions  . Penicillins Swelling, Rash and Other (See Comments)    Pt states that she had tongue swelling.   Has patient had a PCN reaction causing immediate rash, facial/tongue/throat swelling, SOB or lightheadedness with hypotension: Yes Has patient had a PCN reaction causing severe rash involving mucus membranes or skin necrosis: No Has patient had a PCN reaction that required hospitalization No Has patient had a PCN reaction occurring within the last 10 years: No If all of the above answers are "NO", then may proceed with Cephalosporin use.    Patient Measurements: Height: 5\' 2"  (157.5 cm) Weight: 122 lb 9.2 oz (55.6 kg) IBW/kg (Calculated) : 50.1  Vital Signs: Temp: 94.7 F (34.8 C) (04/05 0900) Temp Source: Axillary (04/05 0900) BP: 110/81 mmHg (04/05 0900) Pulse Rate: 91 (04/05 0900)   Intake/Output from previous day: 04/04 0701 - 04/05 0700 In: 2189 [I.V.:1939; IV Piggyback:250] Out: 350 [Urine:350] Intake/Output from this shift: Total I/O In: 114 [I.V.:114] Out: -   Labs:  Recent Labs  05/06/15 1418 05/07/15 0312  WBC 10.9 6.3  HGB 12.0 11.6*  HCT 35.9 34.7*  PLT 272 247  CREATININE 0.51 0.54  MG  --  1.9  PHOS  --  3.5   Estimated Creatinine Clearance: 62.1 mL/min (by C-G formula based on Cr of 0.54).   Microbiology: Recent Results (from the past 720 hour(s))  MRSA PCR Screening     Status: None   Collection Time: 05/06/15  5:43 PM  Result Value Ref Range Status   MRSA by PCR NEGATIVE NEGATIVE Final    Comment:        The GeneXpert MRSA Assay (FDA approved for NASAL specimens only), is one component of a comprehensive MRSA colonization surveillance program. It is not intended to diagnose MRSA infection nor to guide or monitor treatment for MRSA infections.     Assessment: Pharmacy consulted to monitor and adjust  antibiotic dose per renal function.  Plan:  Continue doxycycline 100 mg BID.  After discussion with Dr. Mortimer Fries, will d/c doxycycline after a total of 5 days of therapy.    Pharmacy will continue to monitor, thank you for the consult.  Napoleon Form, PharmD Clinical Pharmacist 05/09/2015,11:32 AM

## 2015-05-09 NOTE — Care Management (Addendum)
Patient was placed on precedex drip 4/4 for agitation.  She is not able to perform any activity on nasal cannula without dropping her 02 sats.  She is currently requiring 30% bipap  Results for DEVERA, MOY (MRN [id number]) as of  Results for CRISANTA, MININNI (MRN [id number]) as of 05/09/2015 18:29  Ref. Range 05/06/2015 16:37 05/08/2015 12:00  pO2, Arterial Latest Ref Range: 83.0-108.0 mmHg 92 87  pCO2 arterial Latest Ref Range: 32.0-48.0 mmHg 57 (H) 66 (H)  FIO2 Unknown 0.30 0.30   Even on continuous bipapp, patient's pco2 remains elevated.  Per intensivisit, reason for retention is not due to obstructed sleep apnea- it is due to her copd.  02 sats  Will initiate referral for non invasive home ventilator through [facility].  Intensivisit will complete his portion of the referral and provide documentation to support need for this equipment.  Will also continue to evaluate for ltac. Patient has Medicare uhc and approval is very difficult and even more difficult prior to a 21 day stay and no intubation.  Patient has to be weaned off precedex before even being considered for ltac

## 2015-05-10 LAB — CREATININE, SERUM
CREATININE: 0.48 mg/dL (ref 0.44–1.00)
GFR calc Af Amer: >60 mL/min (ref >60)
GFR calc non Af Amer: >60 mL/min (ref >60)

## 2015-05-10 LAB — GLUCOSE, CAPILLARY
GLUCOSE-CAPILLARY: 117 mg/dL — AB (ref 65–99)
GLUCOSE-CAPILLARY: 89 mg/dL (ref 65–99)
Glucose-Capillary: 97 mg/dL (ref 65–99)

## 2015-05-10 LAB — CBC
HCT: 35.2 % (ref 35.0–47.0)
HEMOGLOBIN: 11.9 g/dL — AB (ref 12.0–16.0)
MCH: 29.3 pg (ref 26.0–34.0)
MCHC: 33.7 g/dL (ref 32.0–36.0)
MCV: 86.8 fL (ref 80.0–100.0)
PLATELETS: 260 10*3/uL (ref 150–440)
RBC: 4.06 MIL/uL (ref 3.80–5.20)
RDW: 12 % (ref 11.5–14.5)
WBC: 8.8 10*3/uL (ref 3.6–11.0)

## 2015-05-10 LAB — BASIC METABOLIC PANEL
ANION GAP: 0 — AB (ref 5–15)
BUN: 19 mg/dL (ref 6–20)
CALCIUM: 8.4 mg/dL — AB (ref 8.9–10.3)
CO2: 33 mmol/L — AB (ref 22–32)
Chloride: 101 mmol/L (ref 101–111)
Creatinine, Ser: 0.46 mg/dL (ref 0.44–1.00)
Glucose, Bld: 96 mg/dL (ref 65–99)
POTASSIUM: 3.6 mmol/L (ref 3.5–5.1)
Sodium: 134 mmol/L — ABNORMAL LOW (ref 135–145)

## 2015-05-10 LAB — MAGNESIUM: Magnesium: 2 mg/dL (ref 1.7–2.4)

## 2015-05-10 MED ORDER — METHYLPREDNISOLONE SODIUM SUCC 40 MG IJ SOLR: 40.0000 mg | Freq: Every day | INTRAMUSCULAR | Status: DC

## 2015-05-10 MED ORDER — ASPIRIN 81 MG PO CHEW
81.0000 mg | CHEWABLE_TABLET | Freq: Every day | ORAL | Status: DC
  Administered 2015-05-10 – 2015-05-14 (×5): 81 mg via ORAL
  Filled 2015-05-10 (×5): qty 1

## 2015-05-10 NOTE — Care Management (Signed)
Obtained order for overnight oximetry.  Patient at present does not qualify for Astral device because she has showed improvement in PCO2 levels with bipap.

## 2015-05-10 NOTE — Progress Notes (Signed)
Initial Nutrition Assessment       INTERVENTION:  -Monitor diet progression and intake when appropriate   NUTRITION DIAGNOSIS:   Inadequate oral intake related to acute illness as evidenced by NPO status.    GOAL:   Patient will meet greater than or equal to 90% of their needs    MONITOR:   Diet advancement  REASON FOR ASSESSMENT:   Consult Poor PO  ASSESSMENT:   48 y/p female admitted with acute on chronic respiratory failure, COPD exacerbation.  Past Medical History  Diagnosis Date  . COPD (chronic obstructive pulmonary disease) ([place])   . Cancer ([place])   . Breast cancer ([place])    NPO this am. Pt reports good appetite prior to admission, eating 3 meals per day  Medications reviewed: solumedrol, LR at 34ml/hr Labs reviewed: FSBS 97, 129, 116, 113 Na 137, glucose 133  Nutrition-Focused physical exam completed. Findings are WDL for fat depletion, muscle depletion, and edema.    Diet Order:  Diet NPO time specified  Skin:  Reviewed, no issues  Last BM:  prior to admission  Height:   Ht Readings from Last 1 Encounters:  05/06/15 5\' 2"  (1.575 m)    Weight: wt encounters show wt gain.  Pt reports wt gain as well  Wt Readings from Last 1 Encounters:  05/06/15 122 lb 9.2 oz (55.6 kg)    Ideal Body Weight:     BMI:  Body mass index is 22.41 kg/(m^2).  Estimated Nutritional Needs:   Kcal:  [id number] kcals/d  Protein:  55-66 g/d  Fluid:  1.6-1.9 L/d  EDUCATION NEEDS:   No education needs identified at this time    Shenice Dolder B. Zenia Resides, [place], [place] (pager) Weekend/On-Call pager [phone])

## 2015-05-10 NOTE — H&P (Signed)
PULMONARY / CRITICAL CARE MEDICINE   Name: [name] [name] MRN: [id number] DOB: 1960/12/03    ADMISSION DATE:  05/06/2015  PT PROFILE:  55 F smoker with severe emphysema admitted with acute hypercarbic respiratory failure due to COPD exacerbation requiring BiPAP and ICU admission MICRO DATA: MRSA PCR 04/02 >> NEG  ANTIMICROBIALS:  Doxycycline 04/02 >>   CC: follow up COPD exacerbation  HISTORY OF PRESENT ILLNESS:   Increased WOB, b/l wheezing, remains on bipap-will attempt to transition off biPAP this AM when family arrives High risk for intubation On precedex for severe anxiety   No current facility-administered medications on file prior to encounter.   Current Outpatient Prescriptions on File Prior to Encounter  Medication Sig  . albuterol (PROVENTIL HFA;VENTOLIN HFA) 108 (90 Base) MCG/ACT inhaler Inhale 2 puffs into the lungs every 6 (six) hours as needed for wheezing or shortness of breath.  Marland Kitchen aspirin EC 81 MG EC tablet Take 1 tablet (81 mg total) by mouth daily.  . budesonide-formoterol (SYMBICORT) 160-4.5 MCG/ACT inhaler Inhale 2 puffs into the lungs 2 (two) times daily.  Marland Kitchen guaiFENesin (MUCINEX) 600 MG 12 hr tablet Take 1 tablet (600 mg total) by mouth 2 (two) times daily. (Patient taking differently: Take 600 mg by mouth 2 (two) times daily as needed for cough or to loosen phlegm. )  . ipratropium (ATROVENT HFA) 17 MCG/ACT inhaler Inhale 1 puff into the lungs 3 (three) times daily.  Marland Kitchen ipratropium-albuterol (DUONEB) 0.5-2.5 (3) MG/3ML SOLN Take 3 mLs by nebulization every 4 (four) hours as needed (for wheezing/shortness of breath).  Marland Kitchen levofloxacin (LEVAQUIN) 750 MG tablet Take 1 tablet (750 mg total) by mouth daily.  . montelukast (SINGULAIR) 10 MG tablet Take 1 tablet (10 mg total) by mouth at bedtime. (Patient taking differently: Take 10 mg by mouth every morning. )  . predniSONE (DELTASONE) 20 MG tablet Take 3 tablets (60 mg total) by mouth daily.  . QUEtiapine  (SEROQUEL) 50 MG tablet Take 1 tablet (50 mg total) by mouth at bedtime.  Marland Kitchen tiotropium (SPIRIVA) 18 MCG inhalation capsule Place 1 capsule (18 mcg total) into inhaler and inhale daily.       VITAL SIGNS: BP 123/86 mmHg  Pulse 82  Temp(Src) 97.6 F (36.4 C) (Axillary)  Resp 20  Ht 5\' 2"  (1.575 m)  Wt 122 lb 9.2 oz (55.6 kg)  BMI 22.41 kg/m2  SpO2 98%  HEMODYNAMICS:    VENTILATOR SETTINGS: Vent Mode:  [-]  FiO2 (%):  [30 %] 30 %  INTAKE / OUTPUT: I/O last 3 completed shifts: In: 2806 [I.V.:2056; IV Piggyback:750] Out: 950 [Urine:950]   Review of Systems  Unable to perform ROS: critical illness  Respiratory: Positive for shortness of breath and wheezing.     PHYSICAL EXAMINATION: General: Thin, anxious, on biPAP, slight increased WOB, not using accessory muscles to breathe Neuro: CNs intact, motor and sensory intact, DTRs symmetric HEENT: NCAT, sclera white Cardiovascular: Tachy, regular, no M Lungs: hyperresonant to percussion, diffuse prolonged wheezes Abdomen: soft, NT, + BS Ext: warm, no edema  LABS:  BMET  Recent Labs Lab 05/04/15 1619 05/06/15 1418 05/07/15 0312 05/10/15 0543  NA 135 134* 137  --   K 3.9 3.9 3.5  --   CL 104 103 105  --   CO2 26 26 30   --   BUN 14 15 11   --   CREATININE 0.60 0.51 0.54 0.48  GLUCOSE 100* 98 133*  --     Electrolytes  Recent Labs  Lab 05/04/15 1619 05/06/15 1418 05/07/15 0312  CALCIUM 8.7* 8.5* 8.9  MG  --   --  1.9  PHOS  --   --  3.5    CBC  Recent Labs Lab 05/06/15 1418 05/07/15 0312 05/10/15 0543  WBC 10.9 6.3 8.8  HGB 12.0 11.6* 11.9*  HCT 35.9 34.7* 35.2  PLT 272 247 260    Coag's No results for input(s): APTT, INR in the last 168 hours.  Sepsis Markers No results for input(s): LATICACIDVEN, PROCALCITON, O2SATVEN in the last 168 hours.  ABG  Recent Labs Lab 05/06/15 1637 05/08/15 1200  PHART 7.31* 7.36  PCO2ART 57* 66*  PO2ART 92 87    Liver Enzymes No results for  input(s): AST, ALT, ALKPHOS, BILITOT, ALBUMIN in the last 168 hours.  Cardiac Enzymes  Recent Labs Lab 05/04/15 1619 05/06/15 1418  TROPONINI <0.03 <0.03    Glucose  Recent Labs Lab 05/07/15 0717 05/08/15 1631 05/09/15 0831 05/09/15 1728 05/09/15 2332 05/10/15 0746  GLUCAP 123* 107* 113* 116* 129* 97    CXR: hyperinflated, NAD     ASSESSMENT / PLAN:  55 yo AAF admitted for acute COPD exacerbation with severe resp distress this AM PULMONARY A: Acute hypercarbic respiratory failure Severe emphysema AECOPD with acute bronchospasm P:   -BiPAP-will continue  precedex infusion Systemic steroids Nebulized steroids and bronchodilators - using levalbuterol due to tachycardia Supplemental O2 Counseled re: smoking cessation -high risk for intubation  CARDIOVASCULAR A: Sinus tachycardia - reactive P:  Monitor Avoid beta blockade while acutely bronchospastic  RENAL A:   No issues P:   Monitor BMET intermittently Monitor I/Os Correct electrolytes as indicated Maintenance IVFs until able to take POs  GASTROINTESTINAL A:   No issues P:   SUP: N/I unless intubated  HEMATOLOGIC A:   No issues P:  DVT px: LMWH Monitor CBC intermittently Transfuse per usual guidelines  INFECTIOUS A:   COPD exacerbation - empiric treatment for bronchitis P:   Monitor temp, WBC count Micro and abx as above  ENDOCRINE A:   Risk of hyperglycemia while on systemic steroids P:   Monitor CBGs q 8 hrs Consider SSI for glu > 180  NEUROLOGIC A:   Severe anxiety Chronic low dose lorazepam use P:   rass goal 0 Plan for precedex infusion   I have personally obtained a history, examined the patient, evaluated Pertinent laboratory and RadioGraphic/imaging results, and  formulated the assessment and plan   The Patient requires high complexity decision making for assessment and support, frequent evaluation and titration of therapies, application of advanced monitoring  technologies and extensive interpretation of multiple databases. Critical Care Time devoted to patient care services described in this note is 35 minutes.   Overall, patient is critically ill, prognosis is guarded.  Remains critically ill  [name], M.D.  [facility]  Medical Director [place] Director [facility] Cardio-Pulmonary Department

## 2015-05-11 LAB — GLUCOSE, CAPILLARY
GLUCOSE-CAPILLARY: 101 mg/dL — AB (ref 65–99)
GLUCOSE-CAPILLARY: 82 mg/dL (ref 65–99)
GLUCOSE-CAPILLARY: 97 mg/dL (ref 65–99)
Glucose-Capillary: 113 mg/dL — ABNORMAL HIGH (ref 65–99)
Glucose-Capillary: 68 mg/dL (ref 65–99)
Glucose-Capillary: 78 mg/dL (ref 65–99)

## 2015-05-11 MED ORDER — MOMETASONE FURO-FORMOTEROL FUM 200-5 MCG/ACT IN AERO
2.0000 | INHALATION_SPRAY | Freq: Two times a day (BID) | RESPIRATORY_TRACT | Status: DC
  Administered 2015-05-11 – 2015-05-14 (×7): 2 via RESPIRATORY_TRACT
  Filled 2015-05-11: qty 8.8

## 2015-05-11 MED ORDER — ALBUTEROL SULFATE (2.5 MG/3ML) 0.083% IN NEBU
2.5000 mg | INHALATION_SOLUTION | RESPIRATORY_TRACT | Status: DC
  Administered 2015-05-11 – 2015-05-14 (×19): 2.5 mg via RESPIRATORY_TRACT
  Filled 2015-05-11 (×19): qty 3

## 2015-05-11 MED ORDER — FAMOTIDINE 20 MG PO TABS
20.0000 mg | ORAL_TABLET | Freq: Every day | ORAL | Status: DC
  Administered 2015-05-11 – 2015-05-14 (×4): 20 mg via ORAL
  Filled 2015-05-11 (×4): qty 1

## 2015-05-11 MED ORDER — PREDNISONE 20 MG PO TABS
40.0000 mg | ORAL_TABLET | Freq: Every day | ORAL | Status: DC
  Administered 2015-05-12 – 2015-05-14 (×3): 40 mg via ORAL
  Filled 2015-05-11 (×3): qty 2

## 2015-05-11 MED ORDER — SIMETHICONE 80 MG PO CHEW
80.0000 mg | CHEWABLE_TABLET | Freq: Four times a day (QID) | ORAL | Status: DC | PRN
  Administered 2015-05-11 – 2015-05-13 (×2): 80 mg via ORAL
  Filled 2015-05-11 (×2): qty 1

## 2015-05-11 MED ORDER — AZITHROMYCIN 250 MG PO TABS
250.0000 mg | ORAL_TABLET | Freq: Every day | ORAL | Status: DC
  Administered 2015-05-11 – 2015-05-14 (×4): 250 mg via ORAL
  Filled 2015-05-11 (×4): qty 1

## 2015-05-11 MED ORDER — TIOTROPIUM BROMIDE MONOHYDRATE 18 MCG IN CAPS
18.0000 ug | ORAL_CAPSULE | Freq: Every day | RESPIRATORY_TRACT | Status: DC
  Administered 2015-05-11 – 2015-05-14 (×4): 18 ug via RESPIRATORY_TRACT
  Filled 2015-05-11: qty 5

## 2015-05-11 NOTE — Progress Notes (Signed)
Report called, pt hospital stay and treatment reviewed. All questions answered, care to be assumed. Pt transferred to 240.

## 2015-05-11 NOTE — Care Management (Signed)
Inquired with [facility] to see if agency has initiated an authorization for bipap for this patient

## 2015-05-11 NOTE — H&P (Signed)
PULMONARY / CRITICAL CARE MEDICINE   Name: BEULA MELIN MRN: [id number] DOB: 06/04/1960    ADMISSION DATE:  05/06/2015  PT PROFILE:  40 F smoker with severe emphysema admitted with acute hypercarbic respiratory failure due to COPD exacerbation requiring BiPAP and ICU admission MICRO DATA: MRSA PCR 04/02 >> NEG  ANTIMICROBIALS:  Doxycycline 04/02 >>   CC: follow up COPD exacerbation  HISTORY OF PRESENT ILLNESS:   Increased WOB, b/l wheezing,biPAP last night, taken off biPAP this am, feels much better Xanax as needed, start oral feeds today   No current facility-administered medications on file prior to encounter.   Current Outpatient Prescriptions on File Prior to Encounter  Medication Sig  . albuterol (PROVENTIL HFA;VENTOLIN HFA) 108 (90 Base) MCG/ACT inhaler Inhale 2 puffs into the lungs every 6 (six) hours as needed for wheezing or shortness of breath.  Marland Kitchen aspirin EC 81 MG EC tablet Take 1 tablet (81 mg total) by mouth daily.  . budesonide-formoterol (SYMBICORT) 160-4.5 MCG/ACT inhaler Inhale 2 puffs into the lungs 2 (two) times daily.  Marland Kitchen guaiFENesin (MUCINEX) 600 MG 12 hr tablet Take 1 tablet (600 mg total) by mouth 2 (two) times daily. (Patient taking differently: Take 600 mg by mouth 2 (two) times daily as needed for cough or to loosen phlegm. )  . ipratropium (ATROVENT HFA) 17 MCG/ACT inhaler Inhale 1 puff into the lungs 3 (three) times daily.  Marland Kitchen ipratropium-albuterol (DUONEB) 0.5-2.5 (3) MG/3ML SOLN Take 3 mLs by nebulization every 4 (four) hours as needed (for wheezing/shortness of breath).  Marland Kitchen levofloxacin (LEVAQUIN) 750 MG tablet Take 1 tablet (750 mg total) by mouth daily.  . montelukast (SINGULAIR) 10 MG tablet Take 1 tablet (10 mg total) by mouth at bedtime. (Patient taking differently: Take 10 mg by mouth every morning. )  . predniSONE (DELTASONE) 20 MG tablet Take 3 tablets (60 mg total) by mouth daily.  . QUEtiapine (SEROQUEL) 50 MG tablet Take 1 tablet (50 mg  total) by mouth at bedtime.  Marland Kitchen tiotropium (SPIRIVA) 18 MCG inhalation capsule Place 1 capsule (18 mcg total) into inhaler and inhale daily.       VITAL SIGNS: BP 137/80 mmHg  Pulse 90  Temp(Src) 98.5 F (36.9 C) (Axillary)  Resp 25  Ht 5\' 2"  (1.575 m)  Wt 122 lb 9.2 oz (55.6 kg)  BMI 22.41 kg/m2  SpO2 98%  HEMODYNAMICS:    VENTILATOR SETTINGS: Vent Mode:  [-]  FiO2 (%):  [29 %-30 %] 30 %  INTAKE / OUTPUT: I/O last 3 completed shifts: In: 3258.1 [P.O.:900; I.V.:1858.1; IV Piggyback:500] Out: [id number] [Urine:1750]   Review of Systems  Constitutional: Positive for malaise/fatigue. Negative for fever and chills.  Eyes: Negative for blurred vision.  Respiratory: Positive for shortness of breath and wheezing. Negative for cough, hemoptysis and sputum production.   Cardiovascular: Negative for chest pain and leg swelling.  Gastrointestinal: Negative for nausea and vomiting.  Skin: Negative for rash.  Neurological: Negative for dizziness and headaches.  Psychiatric/Behavioral: The patient is nervous/anxious.   All other systems reviewed and are negative.   PHYSICAL EXAMINATION: General: Thin, anxious, on biPAP, slight wheezes Neuro: CNs intact, motor and sensory intact, DTRs symmetric HEENT: NCAT, sclera white Cardiovascular: Tachy, regular, no M Lungs: hyperresonant to percussion, diffuse prolonged wheezes Abdomen: soft, NT, + BS Ext: warm, no edema  LABS:  BMET  Recent Labs Lab 05/06/15 1418 05/07/15 0312 05/10/15 0543  NA 134* 137 134*  K 3.9 3.5 3.6  CL 103 105 101  CO2 26 30 33*  BUN 15 11 19   CREATININE 0.51 0.54 0.46  0.48  GLUCOSE 98 133* 96    Electrolytes  Recent Labs Lab 05/06/15 1418 05/07/15 0312 05/10/15 0543  CALCIUM 8.5* 8.9 8.4*  MG  --  1.9 2.0  PHOS  --  3.5  --     CBC  Recent Labs Lab 05/06/15 1418 05/07/15 0312 05/10/15 0543  WBC 10.9 6.3 8.8  HGB 12.0 11.6* 11.9*  HCT 35.9 34.7* 35.2  PLT 272 247 260     Coag's No results for input(s): APTT, INR in the last 168 hours.  Sepsis Markers No results for input(s): LATICACIDVEN, PROCALCITON, O2SATVEN in the last 168 hours.  ABG  Recent Labs Lab 05/06/15 1637 05/08/15 1200  PHART 7.31* 7.36  PCO2ART 57* 66*  PO2ART 92 87    Liver Enzymes No results for input(s): AST, ALT, ALKPHOS, BILITOT, ALBUMIN in the last 168 hours.  Cardiac Enzymes  Recent Labs Lab 05/04/15 1619 05/06/15 1418  TROPONINI <0.03 <0.03    Glucose  Recent Labs Lab 05/10/15 1125 05/10/15 1616 05/11/15 0002 05/11/15 0552 05/11/15 0645 05/11/15 0724  GLUCAP 117* 89 78 68 97 82    CXR: hyperinflated, NAD     ASSESSMENT / PLAN:  55 yo AAF admitted for acute COPD exacerbation with severe resp distress this AM PULMONARY A: Acute hypercarbic respiratory failure Severe emphysema AECOPD with acute bronchospasm P:   -BiPAP at night and as needed- Continue Systemic steroids Nebulized steroids and bronchodilators - Supplemental O2 Counseled re: smoking cessation  CARDIOVASCULAR A: Sinus tachycardia - reactive P:  Monitor Avoid beta blockade while acutely bronchospastic  RENAL A:   No issues P:   Monitor BMET intermittently Monitor I/Os Correct electrolytes as indicated   GASTROINTESTINAL A:   No issues P:   [facility] diet as tolerated  HEMATOLOGIC A:   No issues P:  DVT px: LMWH Monitor CBC intermittently Transfuse per usual guidelines  INFECTIOUS A:   COPD exacerbation - empiric treatment for bronchitis P:   Monitor temp, WBC count Micro and abx as above  ENDOCRINE A:   Risk of hyperglycemia while on systemic steroids P:   Monitor CBGs q 8 hrs Consider SSI for glu > 180  NEUROLOGIC A:   Severe anxiety Chronic low dose lorazepam use P:   rass goal 0 Xanax as needed   I have personally obtained a history, examined the patient, evaluated Pertinent laboratory and RadioGraphic/imaging results, and  formulated  the assessment and plan  The Patient requires high complexity decision making for assessment and support, frequent evaluation and titration of therapies, application of advanced monitoring technologies and extensive interpretation of multiple databases. Critical Care Time devoted to patient care services described in this note is 35 minutes.   Overall, patient is critically ill, prognosis is guarded.  Patient with Multiorgan failure and at high risk for cardiac arrest and death.  Remains critically ill  [name], M.D.  [facility]  Medical Director [place] Director [facility] Cardio-Pulmonary Department

## 2015-05-11 NOTE — Care Management (Signed)
Did not drops 02 sats for a cumulative of five minutes on the overnight oximetry.  Duration of session was approximately 5 hours.  She did require to be placed on bipap.  Patient is going to require bipap at night while sleeping.  Informed by [facility] that [employer] does not give prior approval for bipap.  Patient will be expected to pay 244 dollars out of pocket

## 2015-05-12 LAB — GLUCOSE, CAPILLARY
GLUCOSE-CAPILLARY: 98 mg/dL (ref 65–99)
GLUCOSE-CAPILLARY: 176 mg/dL — AB (ref 65–99)
GLUCOSE-CAPILLARY: 133 mg/dL — AB (ref 65–99)
Glucose-Capillary: 95 mg/dL (ref 65–99)

## 2015-05-12 MED ORDER — GUAIFENESIN ER 600 MG PO TB12: 600.0000 mg | ORAL_TABLET | Freq: Two times a day (BID) | ORAL | Status: DC | PRN

## 2015-05-12 MED ORDER — PREDNISONE 10 MG PO TABS: ORAL_TABLET | ORAL | Status: DC

## 2015-05-12 NOTE — Progress Notes (Signed)
Patient doing well on 2L [place]; SPO2 in the mid to high 90s. Offers no complaints. Patient stable for transfers to the floor. Spoke with Dr Lance Coon  At 2100 on 04/07 who accepted the patient for [facility]. Admitting MD for [facility] notified this morning.    Mouhamadou Gittleman S. [facility] ANP-BC Pulmonary and Critical Care Medicine [facility] Pager [phone]

## 2015-05-12 NOTE — Care Management Note (Signed)
Case Management Note  Patient Details  Name: [name] [name] MRN: [id number] Date of Birth: 1960/06/10  Subjective/Objective:      Discussed Bipap night time need with Alana, RN, who is going to call Dr Bridgett Larsson to see if he wants [name] to remain inpatient until Monday to obtain a Bipap machine.               Action/Plan:   Expected Discharge Date:                  Expected Discharge Plan:     In-House Referral:     Discharge planning Services     Post Acute Care Choice:    Choice offered to:     DME Arranged:    DME Agency:     HH Arranged:    [place] Agency:     Status of Service:     Medicare Important Message Given:    Date Medicare IM Given:    Medicare IM give by:    Date Additional Medicare IM Given:    Additional Medicare Important Message give by:     If discussed at [place] of Stay Meetings, dates discussed:    Additional Comments:  Kelita Wallis A, RN 05/12/2015, 2:14 PM

## 2015-05-12 NOTE — Progress Notes (Signed)
Discharge put on hold due to waiting on preauthorization for at home bipap for patient to use at night. Company is unavailable until Monday. Discussed with care manager and Dr. Bridgett Larsson

## 2015-05-12 NOTE — Progress Notes (Signed)
[name] [name] at [place] NAME: [name] [name]    MR#:  [id number]  DATE OF BIRTH:  [birth year]  SUBJECTIVE:  CHIEF COMPLAINT:   Chief Complaint  Patient presents with  . Respiratory Distress   No complaint, on O2 [place] 2L.  REVIEW OF SYSTEMS:  CONSTITUTIONAL: No fever, fatigue or weakness.  EYES: No blurred or double vision.  EARS, NOSE, AND THROAT: No tinnitus or ear pain.  RESPIRATORY: No cough, shortness of breath, wheezing or hemoptysis.  CARDIOVASCULAR: No chest pain, orthopnea, edema.  GASTROINTESTINAL: No nausea, vomiting, diarrhea or abdominal pain.  GENITOURINARY: No dysuria, hematuria.  ENDOCRINE: No polyuria, nocturia,  HEMATOLOGY: No anemia, easy bruising or bleeding SKIN: No rash or lesion. MUSCULOSKELETAL: No joint pain or arthritis.   NEUROLOGIC: No tingling, numbness, weakness.  PSYCHIATRY: No anxiety or depression.   DRUG ALLERGIES:   Allergies  Allergen Reactions  . Penicillins Swelling, Rash and Other (See Comments)    Pt states that she had tongue swelling.   Has patient had a PCN reaction causing immediate rash, facial/tongue/throat swelling, SOB or lightheadedness with hypotension: Yes Has patient had a PCN reaction causing severe rash involving mucus membranes or skin necrosis: No Has patient had a PCN reaction that required hospitalization No Has patient had a PCN reaction occurring within the last 10 years: No If all of the above answers are "NO", then may proceed with Cephalosporin use.    VITALS:  Blood pressure 114/61, pulse 95, temperature 98 F (36.7 C), temperature source Oral, resp. rate 18, height 5\' 2"  (1.575 m), weight 56.065 kg (123 lb 9.6 oz), SpO2 93 %.  PHYSICAL EXAMINATION:  GENERAL:  55 y.o.-year-old patient lying in the bed with no acute distress.  EYES: Pupils equal, round, reactive to light and accommodation. No scleral icterus. Extraocular muscles intact.  HEENT: Head  atraumatic, normocephalic. Oropharynx and nasopharynx clear.  NECK:  Supple, no jugular venous distention. No thyroid enlargement, no tenderness.  LUNGS: Very diminished breath sounds and limited air entry bilaterally, no wheezing, rales,rhonchi or crepitation. No use of accessory muscles of respiration.  CARDIOVASCULAR: S1, S2 normal. No murmurs, rubs, or gallops.  ABDOMEN: Soft, nontender, nondistended. Bowel sounds present. No organomegaly or mass.  EXTREMITIES: No pedal edema, cyanosis, or clubbing.  NEUROLOGIC: Cranial nerves II through XII are intact. Muscle strength 5/5 in all extremities. Sensation intact. Gait not checked.  PSYCHIATRIC: The patient is alert and oriented x 3.  SKIN: No obvious rash, lesion, or ulcer.    LABORATORY PANEL:   CBC  Recent Labs Lab 05/10/15 0543  WBC 8.8  HGB 11.9*  HCT 35.2  PLT 260   ------------------------------------------------------------------------------------------------------------------  Chemistries   Recent Labs Lab 05/10/15 0543  NA 134*  K 3.6  CL 101  CO2 33*  GLUCOSE 96  BUN 19  CREATININE 0.46  0.48  CALCIUM 8.4*  MG 2.0   ------------------------------------------------------------------------------------------------------------------  Cardiac Enzymes  Recent Labs Lab 05/06/15 1418  TROPONINI <0.03   ------------------------------------------------------------------------------------------------------------------  RADIOLOGY:  No results found.  EKG:   Orders placed or performed during the hospital encounter of 05/06/15  . ED EKG  . ED EKG  . EKG 12-Lead  . EKG 12-Lead    ASSESSMENT AND PLAN:   Acute hypercarbic respiratory failure Severe emphysema AECOPD with acute bronchospasm  Continue prednisone, Nebulized steroids and bronchodilators. Supplemental O2 and BIPAP agt night and prn per Dr. Mortimer Fries.  Severe anxiety Chronic low dose lorazepam use Xanax as needed  Per CM, waiting for home  BIPAP authorization. Company is unavailable until Monday. All the records are reviewed and case discussed with Care Management/Social Workerr. Management plans discussed with the patient, her daughter and they are in agreement.  CODE STATUS: full code.  TOTAL TIME TAKING CARE OF THIS PATIENT: 37 minutes.  Greater than 50% time was spent on coordination of care and face-to-face counseling.  POSSIBLE D/C IN 2 DAYS, DEPENDING ON CLINICAL CONDITION.   Demetrios Loll M.D on 05/12/2015 at 3:10 PM  Between 7am to 6pm - Pager - [phone]  After 6pm go to [email or web address] - password [email or web address] [place] [facility]  Office  [phone]  CC: Primary care physician; Letta Median, MD

## 2015-05-12 NOTE — Discharge Instructions (Signed)
Heart healthy diet. Activity as tolerated. HHPT. Home O2 [place] 2 L and bipap at night while sleeping.

## 2015-05-12 NOTE — Care Management Note (Addendum)
Case Management Note  Patient Details  Name: [name] [name] MRN: [id number] Date of Birth: 1961/01/15  Subjective/Objective:      Per case manager Nann Greene's note from 05/11/15, Ms Wammack needs Bipap at night. She had overnight oximetry testing. Ms Killoran will have to pay $244 out of pocket to obtain a Bipap machine. This Probation officer called [facility] Anne Ng who stated that she has the information requesting the Bipap machine which was provided by Floydene Flock, [place], but that Bipap has not been approved by the CPAP Team at [facility] and that this team will not be back at work at [place] until Monday 05/14/15. At that time they will review the request for a Bipap machine. Will need orders for home health RN, Aid, and Social worker at time of discharge.               Action/Plan:   Expected Discharge Date:                  Expected Discharge Plan:     In-House Referral:     Discharge planning Services     Post Acute Care Choice:    Choice offered to:     DME Arranged:    DME Agency:     HH Arranged:    [place] Agency:     Status of Service:     Medicare Important Message Given:    Date Medicare IM Given:    Medicare IM give by:    Date Additional Medicare IM Given:    Additional Medicare Important Message give by:     If discussed at [place] of Stay Meetings, dates discussed:    Additional Comments:  Lynn Sissel A, RN 05/12/2015, 11:42 AM

## 2015-05-12 NOTE — Evaluation (Signed)
Physical Therapy Evaluation Patient Details Name: [name] [name] MRN: [id number] DOB: 02/23/1960 Today's Date: 05/12/2015   History of Present Illness  Pt here with acute respiratory failure, was in CCU.  She chronically uses O2 at night, occasionally during the day. Pt on 2 liters w/o PT exam.  Clinical Impression  Pt is able to ambulate with mild hesitancy and is slower and more cautious than her baseline.  She remains on 2 liters O2 t/o the session and her sats remain in the mid/low 90s.  She is not overly confident about going home, but will be staying with her daughter initially and should do fine with HHPT.    Follow Up Recommendations Home health PT    Equipment Recommendations       Recommendations for Other Services       Precautions / Restrictions Precautions Precautions:  (moderate fall risk) Restrictions Weight Bearing Restrictions: No      Mobility  Bed Mobility Overal bed mobility: Independent             General bed mobility comments: Pt with some minimal dizziness on first sitting up, otherwise no issues.   Transfers Overall transfer level: Independent Equipment used: None             General transfer comment: Pt able to stand w/o assist, does have some initial unsteadiness but no signficant safety issue.  Ambulation/Gait Ambulation/Gait assistance: Min guard Ambulation Distance (Feet): 250 Feet Assistive device:  (pt occasionally using IV pole and hallway rails)       General Gait Details: Pt is able to ambulate w/o AD, but does have inconsistent gait with occasional use of IV pole and hallway rails.  She has some fatigue t/o the session but O2 remains in the mid/low 90s.  She needs to take one short rest break but ultimately is able to ambulate cautiously but w/o signficant safety concerns.  Stairs            Wheelchair Mobility    Modified Rankin (Stroke Patients Only)       Balance Overall balance assessment: Needs  assistance             Standing balance comment: Pt lacks confidence with ambulation, but is able to maintain balance and show good effort t/o the session.                              Pertinent Vitals/Pain Pain Assessment: No/denies pain    Home Living Family/patient expects to be discharged to:: Private residence Living Arrangements: Alone Available Help at Discharge:  (Will likely stay with daughter fior a while after d/c)   Home Access: Stairs to enter Entrance Stairs-Rails: Right Entrance Stairs-Number of Steps: 10   Home Equipment:  (O2)      Prior Function Level of Independence: Independent         Comments: Pt is typically able to be active, run errands, drive, etc w/o issue.     Hand Dominance        Extremity/Trunk Assessment   Upper Extremity Assessment: Overall WFL for tasks assessed           Lower Extremity Assessment: Overall WFL for tasks assessed         Communication   Communication: No difficulties  Cognition Arousal/Alertness: Awake/alert Behavior During Therapy: WFL for tasks assessed/performed Overall Cognitive Status: Within Functional Limits for tasks assessed  General Comments      Exercises        Assessment/Plan    PT Assessment Patient needs continued PT services  PT Diagnosis Difficulty walking;Generalized weakness   PT Problem List Decreased activity tolerance;Decreased balance;Decreased safety awareness;Decreased strength  PT Treatment Interventions Gait training;Therapeutic activities;Therapeutic exercise;Balance training;Neuromuscular re-education   PT Goals (Current goals can be found in the Care Plan section) Acute Rehab PT Goals Patient Stated Goal: Build up my strength PT Goal Formulation: With patient Time For Goal Achievement: 05/26/15 Potential to Achieve Goals: Fair    Frequency Min 2X/week   Barriers to discharge        Co-evaluation                End of Session Equipment Utilized During Treatment: Gait belt;Oxygen (2 liters) Activity Tolerance: Patient limited by fatigue Patient left: with bed alarm set;with call bell/phone within reach           Time: [id number] PT Time Calculation (min) (ACUTE ONLY): 25 min   Charges:   PT Evaluation $PT Eval Low Complexity: 1 Procedure     PT G Codes:       [name] [name], PT, DPT [phone]  [name] [name] 05/12/2015, 3:16 PM

## 2015-05-12 NOTE — Progress Notes (Signed)
Patient is alert and oriented x 4. Transferred to room 240. Report received from Ginger. BAPAP in place. No acute respiratory distress noted. Patient oreinted to her room , call bell/ascom and staff. Tele box verified by the RN and Daniell NT. Bed in the lowest position.

## 2015-05-13 LAB — GLUCOSE, CAPILLARY
GLUCOSE-CAPILLARY: 135 mg/dL — AB (ref 65–99)
Glucose-Capillary: 94 mg/dL (ref 65–99)
Glucose-Capillary: 94 mg/dL (ref 65–99)
Glucose-Capillary: 119 mg/dL — ABNORMAL HIGH (ref 65–99)

## 2015-05-13 LAB — CREATININE, SERUM
Creatinine, Ser: 0.45 mg/dL (ref 0.44–1.00)
GFR calc Af Amer: >60 mL/min (ref >60)
GFR calc non Af Amer: >60 mL/min (ref >60)

## 2015-05-13 MED ORDER — PREDNISONE 20 MG PO TABS: 40.0000 mg | ORAL_TABLET | Freq: Every day | ORAL | Status: DC

## 2015-05-13 NOTE — H&P (Addendum)
PULMONARY / CRITICAL CARE MEDICINE   Name: [name] [name] MRN: [id number] DOB: 08-Apr-1960    ADMISSION DATE:  05/06/2015  PT PROFILE:  55 F smoker with severe emphysema admitted with acute hypercarbic respiratory failure due to COPD exacerbation requiring BiPAP and ICU admission MICRO DATA: MRSA PCR 04/02 >> NEG  ANTIMICROBIALS:  Doxycycline 04/02 >>   CC: follow up COPD exacerbation  HISTORY OF PRESENT ILLNESS:   Doing well this AM, did not wear bipap lastnight, did not think she needed. On 2L [place], baseline. Patient requesting to follow up with Dr. Mortimer Fries   No current facility-administered medications on file prior to encounter.   Current Outpatient Prescriptions on File Prior to Encounter  Medication Sig  . albuterol (PROVENTIL HFA;VENTOLIN HFA) 108 (90 Base) MCG/ACT inhaler Inhale 2 puffs into the lungs every 6 (six) hours as needed for wheezing or shortness of breath.  Marland Kitchen aspirin EC 81 MG EC tablet Take 1 tablet (81 mg total) by mouth daily.  . budesonide-formoterol (SYMBICORT) 160-4.5 MCG/ACT inhaler Inhale 2 puffs into the lungs 2 (two) times daily.  Marland Kitchen ipratropium (ATROVENT HFA) 17 MCG/ACT inhaler Inhale 1 puff into the lungs 3 (three) times daily.  Marland Kitchen ipratropium-albuterol (DUONEB) 0.5-2.5 (3) MG/3ML SOLN Take 3 mLs by nebulization every 4 (four) hours as needed (for wheezing/shortness of breath).  . montelukast (SINGULAIR) 10 MG tablet Take 1 tablet (10 mg total) by mouth at bedtime. (Patient taking differently: Take 10 mg by mouth every morning. )  . predniSONE (DELTASONE) 20 MG tablet Take 3 tablets (60 mg total) by mouth daily.  . QUEtiapine (SEROQUEL) 50 MG tablet Take 1 tablet (50 mg total) by mouth at bedtime.  Marland Kitchen tiotropium (SPIRIVA) 18 MCG inhalation capsule Place 1 capsule (18 mcg total) into inhaler and inhale daily.       VITAL SIGNS: BP 115/70 mmHg  Pulse 98  Temp(Src) 97.8 F (36.6 C) (Oral)  Resp 20  Ht 5\' 2"  (1.575 m)  Wt 123 lb 9.6 oz (56.065 kg)   BMI 22.60 kg/m2  SpO2 97%  HEMODYNAMICS:    VENTILATOR SETTINGS:    INTAKE / OUTPUT: I/O last 3 completed shifts: In: 390 [P.O.:240; I.V.:150] Out: 3725 [Urine:3725]   Review of Systems  Constitutional: Negative for fever and chills.  Eyes: Negative for blurred vision.  Respiratory: Negative for cough, hemoptysis, sputum production, shortness of breath and wheezing.   Cardiovascular: Negative for chest pain and leg swelling.  Gastrointestinal: Negative for nausea and vomiting.  Skin: Negative for rash.  Neurological: Negative for dizziness and headaches.  Psychiatric/Behavioral: The patient is not nervous/anxious.   All other systems reviewed and are negative.   PHYSICAL EXAMINATION: General: Thin, anxious, on biPAP, slight wheezes Neuro: CNs intact, motor and sensory intact, DTRs symmetric HEENT: NCAT, sclera white Cardiovascular: s1,s2, regular, no M Lungs: hyperresonant to percussion, no wheezes Abdomen: soft, NT, + BS Ext: warm, no edema  LABS:  BMET  Recent Labs Lab 05/06/15 1418 05/07/15 0312 05/10/15 0543 05/13/15 0508  NA 134* 137 134*  --   K 3.9 3.5 3.6  --   CL 103 105 101  --   CO2 26 30 33*  --   BUN 15 11 19   --   CREATININE 0.51 0.54 0.46  0.48 0.45  GLUCOSE 98 133* 96  --     Electrolytes  Recent Labs Lab 05/06/15 1418 05/07/15 0312 05/10/15 0543  CALCIUM 8.5* 8.9 8.4*  MG  --  1.9 2.0  PHOS  --  3.5  --     CBC  Recent Labs Lab 05/06/15 1418 05/07/15 0312 05/10/15 0543  WBC 10.9 6.3 8.8  HGB 12.0 11.6* 11.9*  HCT 35.9 34.7* 35.2  PLT 272 247 260    Coag's No results for input(s): APTT, INR in the last 168 hours.  Sepsis Markers No results for input(s): LATICACIDVEN, PROCALCITON, O2SATVEN in the last 168 hours.  ABG  Recent Labs Lab 05/06/15 1637 05/08/15 1200  PHART 7.31* 7.36  PCO2ART 57* 66*  PO2ART 92 87    Liver Enzymes No results for input(s): AST, ALT, ALKPHOS, BILITOT, ALBUMIN in the last 168  hours.  Cardiac Enzymes  Recent Labs Lab 05/06/15 1418  TROPONINI <0.03    Glucose  Recent Labs Lab 05/12/15 0733 05/12/15 1133 05/12/15 1629 05/12/15 2107 05/13/15 0729 05/13/15 1135  GLUCAP 98 95 176* 133* 94 94    CXR: hyperinflated, NAD     ASSESSMENT / PLAN:  55 yo AAF admitted for acute COPD exacerbation with severe resp distress this AM PULMONARY A: Acute hypercarbic respiratory failure - now resolved Severe emphysema AECOPD with acute bronchospasm - resolving P:   -BiPAP at night with 2L O2 bleed in and as needed-until f\u with Dr. Mortimer Fries -prednisone 40mg  daily and azithromycin daily until f\u with Dr. Mortimer Fries -Supplemental O2 - on 2L at baseline -Counseled re: smoking cessation -stop pulmicort, and cont with  dulera and spiriva  CARDIOVASCULAR A: Sinus tachycardia - reactive P:  Monitor Avoid beta blockade while acutely bronchospastic  RENAL A:   No issues P:   Monitor BMET intermittently Monitor I/Os Correct electrolytes as indicated   GASTROINTESTINAL A:   No issues P:   Advance diet as tolerated  HEMATOLOGIC A:   No issues P:  DVT px: LMWH Monitor CBC intermittently Transfuse per usual guidelines  INFECTIOUS A:   COPD exacerbation - empiric treatment for bronchitis P:   Monitor temp, WBC count Micro and abx as above  ENDOCRINE A:   Risk of hyperglycemia while on systemic steroids P:   Monitor CBGs q 8 hrs Consider SSI for glu > 180  NEUROLOGIC A:   Severe anxiety Chronic low dose lorazepam use P:   rass goal 0 Xanax as needed  [facility] will call patient with Pulmonary follow up appointment.    Thank you for consulting [facility], we will signoff at this time.  Please feel free to contact us with any questions at [phone] (please enter 7-digits).   I have personally obtained a history, examined the patient, evaluated Pertinent laboratory and [employer]/imaging  results, and  formulated the assessment and plan  Pulmonary Consult time - 57 mins  Vilinda Boehringer, MD [facility] Pager [phone] (please enter 7-digits) On Call Pager - [phone] (please enter 7-digits)

## 2015-05-13 NOTE — Progress Notes (Signed)
Patient rested quietly tonight with no complaints. A&Ox4, VSS, and NSR on tele. Nursing staff will continue to monitor. Earleen Reaper, RN

## 2015-05-13 NOTE — Progress Notes (Signed)
[place] [facility] [place] NAME: [name] [name]    MR#:  [id number]  DATE OF BIRTH:  [birth year]  SUBJECTIVE:  CHIEF COMPLAINT:   Chief Complaint  Patient presents with  . Respiratory Distress   No complaint, on O2 [place] 2L. She did not wear bipap lastnight, did not think she needed.   REVIEW OF SYSTEMS:  CONSTITUTIONAL: No fever, fatigue or weakness.  EYES: No blurred or double vision.  EARS, NOSE, AND THROAT: No tinnitus or ear pain.  RESPIRATORY: No cough, shortness of breath, wheezing or hemoptysis.  CARDIOVASCULAR: No chest pain, orthopnea, edema.  GASTROINTESTINAL: No nausea, vomiting, diarrhea or abdominal pain.  GENITOURINARY: No dysuria, hematuria.  ENDOCRINE: No polyuria, nocturia,  HEMATOLOGY: No anemia, easy bruising or bleeding SKIN: No rash or lesion. MUSCULOSKELETAL: No joint pain or arthritis.   NEUROLOGIC: No tingling, numbness, weakness.  PSYCHIATRY: No anxiety or depression.   DRUG ALLERGIES:   Allergies  Allergen Reactions  . Penicillins Swelling, Rash and Other (See Comments)    Pt states that she had tongue swelling.   Has patient had a PCN reaction causing immediate rash, facial/tongue/throat swelling, SOB or lightheadedness with hypotension: Yes Has patient had a PCN reaction causing severe rash involving mucus membranes or skin necrosis: No Has patient had a PCN reaction that required hospitalization No Has patient had a PCN reaction occurring within the last 10 years: No If all of the above answers are "NO", then may proceed with Cephalosporin use.    VITALS:  Blood pressure 115/70, pulse 98, temperature 97.8 F (36.6 C), temperature source Oral, resp. rate 20, height 5\' 2"  (1.575 m), weight 56.065 kg (123 lb 9.6 oz), SpO2 97 %.  PHYSICAL EXAMINATION:  GENERAL:  55 y.o.-year-old patient lying in the bed with no acute distress.  EYES: Pupils equal, round, reactive to light and accommodation. No  scleral icterus. Extraocular muscles intact.  HEENT: Head atraumatic, normocephalic. Oropharynx and nasopharynx clear.  NECK:  Supple, no jugular venous distention. No thyroid enlargement, no tenderness.  LUNGS: Very diminished breath sounds and limited air entry bilaterally, no wheezing, rales,rhonchi or crepitation. No use of accessory muscles of respiration.  CARDIOVASCULAR: S1, S2 normal. No murmurs, rubs, or gallops.  ABDOMEN: Soft, nontender, nondistended. Bowel sounds present. No organomegaly or mass.  EXTREMITIES: No pedal edema, cyanosis, or clubbing.  NEUROLOGIC: Cranial nerves II through XII are intact. Muscle strength 5/5 in all extremities. Sensation intact. Gait not checked.  PSYCHIATRIC: The patient is alert and oriented x 3.  SKIN: No obvious rash, lesion, or ulcer.    LABORATORY PANEL:   CBC  Recent Labs Lab 05/10/15 0543  WBC 8.8  HGB 11.9*  HCT 35.2  PLT 260   ------------------------------------------------------------------------------------------------------------------  Chemistries   Recent Labs Lab 05/10/15 0543 05/13/15 0508  NA 134*  --   K 3.6  --   CL 101  --   CO2 33*  --   GLUCOSE 96  --   BUN 19  --   CREATININE 0.46  0.48 0.45  CALCIUM 8.4*  --   MG 2.0  --    ------------------------------------------------------------------------------------------------------------------  Cardiac Enzymes No results for input(s): TROPONINI in the last 168 hours. ------------------------------------------------------------------------------------------------------------------  RADIOLOGY:  No results found.  EKG:   Orders placed or performed during the hospital encounter of 05/06/15  . ED EKG  . ED EKG  . EKG 12-Lead  . EKG 12-Lead    ASSESSMENT AND PLAN:   Acute  hypercarbic respiratory failure Severe emphysema AECOPD with acute bronchospasm  Continue prednisone 40 mg po daily, Zithromax, dulera, Spiriva and Singulair. Discontinue  Pulmicort per Dr. Stevenson Clinch. Supplemental O2 and BIPAP agt night and prn Dr. Stevenson Clinch.  Severe anxiety Chronic low dose lorazepam use Xanax as needed  I discussed with Dr. Stevenson Clinch.  Per CM, waiting for home BIPAP authorization. Company is unavailable until Monday. All the records are reviewed and case discussed with Care Management/Social Workerr. Management plans discussed with the patient, her daughter and they are in agreement.  CODE STATUS: full code.  TOTAL TIME TAKING CARE OF THIS PATIENT: 35 minutes.  Greater than 50% time was spent on coordination of care and face-to-face counseling.  POSSIBLE D/C IN 2 DAYS, DEPENDING ON CLINICAL CONDITION.   Demetrios Loll M.D on 05/13/2015 at 3:07 PM  Between 7am to 6pm - Pager - [phone]  After 6pm go to [email or web address] - password [email or web address]  Office  [phone]  CC: Primary care physician; Letta Median, MD

## 2015-05-14 LAB — GLUCOSE, CAPILLARY: GLUCOSE-CAPILLARY: 77 mg/dL (ref 65–99)

## 2015-05-14 MED ORDER — TIOTROPIUM BROMIDE MONOHYDRATE 18 MCG IN CAPS: 18.0000 ug | ORAL_CAPSULE | Freq: Every day | RESPIRATORY_TRACT | Status: DC

## 2015-05-14 MED ORDER — MOMETASONE FURO-FORMOTEROL FUM 200-5 MCG/ACT IN AERO: 2.0000 | INHALATION_SPRAY | Freq: Two times a day (BID) | RESPIRATORY_TRACT | Status: DC

## 2015-05-14 MED ORDER — ASPIRIN 81 MG PO TBEC: 81.0000 mg | DELAYED_RELEASE_TABLET | Freq: Every day | ORAL | Status: AC

## 2015-05-14 MED ORDER — IPRATROPIUM-ALBUTEROL 0.5-2.5 (3) MG/3ML IN SOLN: 3.0000 mL | RESPIRATORY_TRACT | Status: DC | PRN

## 2015-05-14 MED ORDER — ALPRAZOLAM 0.5 MG PO TABS: 0.5000 mg | ORAL_TABLET | Freq: Three times a day (TID) | ORAL | Status: DC | PRN

## 2015-05-14 MED ORDER — AZITHROMYCIN 250 MG PO TABS: 250.0000 mg | ORAL_TABLET | Freq: Every day | ORAL | Status: DC

## 2015-05-14 MED ORDER — GUAIFENESIN ER 600 MG PO TB12: 600.0000 mg | ORAL_TABLET | Freq: Two times a day (BID) | ORAL | Status: DC | PRN

## 2015-05-14 MED ORDER — QUETIAPINE FUMARATE 50 MG PO TABS
50.0000 mg | ORAL_TABLET | Freq: Every day | ORAL | Status: DC
Start: 2015-05-14 — End: 2018-04-12

## 2015-05-14 MED ORDER — ALBUTEROL SULFATE HFA 108 (90 BASE) MCG/ACT IN AERS: 2.0000 | INHALATION_SPRAY | Freq: Four times a day (QID) | RESPIRATORY_TRACT | Status: DC | PRN

## 2015-05-14 MED ORDER — MONTELUKAST SODIUM 10 MG PO TABS: 10.0000 mg | ORAL_TABLET | ORAL | Status: DC

## 2015-05-14 NOTE — Progress Notes (Signed)
Discharge instructions given. IV and tele removed. Prescriptions given as well as respiratory education. Patient has no further questions. Daughter brought home oxygen tank. Bipap will be delivered to patient's house according to care management.

## 2015-05-14 NOTE — Care Management (Signed)
[facility] is able to accept patient for nursing, PT, SW and HHA.  Dr.  Kathaleen Bury will be patients PCP. He will make home visits to patient. TC to patient and her daughter, Karrie Meres,  to update them on services to be provided.

## 2015-05-14 NOTE — Care Management (Signed)
Daughter bringing home portable tank for discharge.

## 2015-05-14 NOTE — Treatment Plan (Signed)
Current Bipap settings at night- IPAP-12 EPAP-6 FiO2-30% Respiratory Rate-10

## 2015-05-14 NOTE — Care Management (Addendum)
Fund request sent to the [facility] for the first month of patient's BiPAP machine ($244). MD requiring that patient be discharge with BiPAP. TC to [facility] to see if they can accept patient and have one of their PCP see patient this week and sign home health orders. Faxed face sheet and H&P to [phone]. Waiting on response. Spoke with [place] and they are following up on pending [facility] Medicare approrval for BiPAP which can take 7-10 days. Patient updated.

## 2015-05-14 NOTE — Discharge Summary (Signed)
[place] [facility] [place] NAME: [name] [name]    MR#:  [id number]  DATE OF BIRTH:  Nov 26, 1960  DATE OF ADMISSION:  05/06/2015 ADMITTING PHYSICIAN: No admitting provider for patient encounter.  DATE OF DISCHARGE: 05/14/2015  2:49 PM  PRIMARY CARE PHYSICIAN: Letta Median, MD    ADMISSION DIAGNOSIS:  COPD exacerbation ([place]) [J44.1]   DISCHARGE DIAGNOSIS:  Acute hypercarbic respiratory failure Severe emphysema AECOPD with acute bronchospasm Severe anxiety SECONDARY DIAGNOSIS:   Past Medical History  Diagnosis Date  . COPD (chronic obstructive pulmonary disease) ([place])   . Cancer ([place])   . Breast cancer [facility])     HOSPITAL COURSE:   Acute hypercarbic respiratory failure Severe emphysema AECOPD with acute bronchospasm She was treated with BIPAP in ICU, off BIPAP and wean down O2 [place] to 2L She has been treated with prednisone 40 mg po daily, Zithromax, dulera, Spiriva and Singulair. Discontinued Pulmicort per Dr. Stevenson Clinch. She need continue prednisone 40 mg po daily and Zithromax until she see Dr. Mortimer Fries as outpatient. She Supplemental O2 and BIPAP at night and prn Dr. Stevenson Clinch.   Severe anxiety Chronic low dose lorazepam use Xanax as needed  DISCHARGE CONDITIONS:   Stable, discharge to home with HHPT.  CONSULTS OBTAINED:     DRUG ALLERGIES:   Allergies  Allergen Reactions  . Penicillins Swelling, Rash and Other (See Comments)    Pt states that she had tongue swelling.   Has patient had a PCN reaction causing immediate rash, facial/tongue/throat swelling, SOB or lightheadedness with hypotension: Yes Has patient had a PCN reaction causing severe rash involving mucus membranes or skin necrosis: No Has patient had a PCN reaction that required hospitalization No Has patient had a PCN reaction occurring within the last 10 years: No If all of the above answers are "NO", then may proceed with Cephalosporin use.     DISCHARGE MEDICATIONS:   Discharge Medication List as of 05/14/2015  2:01 PM    START taking these medications   Details  ALPRAZolam (XANAX) 0.5 MG tablet Take 1 tablet (0.5 mg total) by mouth 3 (three) times daily as needed for anxiety., Starting 05/14/2015, Until Discontinued, Print    azithromycin (ZITHROMAX) 250 MG tablet Take 1 tablet (250 mg total) by mouth daily., Starting 05/14/2015, Until Discontinued, Print    mometasone-formoterol (DULERA) 200-5 MCG/ACT AERO Inhale 2 puffs into the lungs 2 (two) times daily., Starting 05/14/2015, Until Discontinued, Print      CONTINUE these medications which have CHANGED   Details  albuterol (PROVENTIL HFA;VENTOLIN HFA) 108 (90 Base) MCG/ACT inhaler Inhale 2 puffs into the lungs every 6 (six) hours as needed for wheezing or shortness of breath., Starting 05/14/2015, Until Discontinued, Print    aspirin 81 MG EC tablet Take 1 tablet (81 mg total) by mouth daily., Starting 05/14/2015, Until Discontinued, Print    guaiFENesin (MUCINEX) 600 MG 12 hr tablet Take 1 tablet (600 mg total) by mouth 2 (two) times daily as needed for cough or to loosen phlegm., Starting 05/14/2015, Until Discontinued, Print    ipratropium-albuterol (DUONEB) 0.5-2.5 (3) MG/3ML SOLN Take 3 mLs by nebulization every 4 (four) hours as needed (for wheezing/shortness of breath)., Starting 05/14/2015, Until Discontinued, Print    montelukast (SINGULAIR) 10 MG tablet Take 1 tablet (10 mg total) by mouth every morning., Starting 05/14/2015, Until Discontinued, Print    predniSONE (DELTASONE) 20 MG tablet Take 2 tablets (40 mg total) by mouth daily with breakfast., Starting 05/13/2015,  Until Discontinued, Print    QUEtiapine (SEROQUEL) 50 MG tablet Take 1 tablet (50 mg total) by mouth at bedtime., Starting 05/14/2015, Until Discontinued, Print    tiotropium (SPIRIVA) 18 MCG inhalation capsule Place 1 capsule (18 mcg total) into inhaler and inhale daily., Starting 05/14/2015, Until  Discontinued, Print      CONTINUE these medications which have NOT CHANGED   Details  OXYGEN Inhale 2 L/min into the lungs at bedtime., Until Discontinued, Historical Med      STOP taking these medications     budesonide-formoterol (SYMBICORT) 160-4.5 MCG/ACT inhaler      ipratropium (ATROVENT HFA) 17 MCG/ACT inhaler      levofloxacin (LEVAQUIN) 750 MG tablet          DISCHARGE INSTRUCTIONS:    If you experience worsening of your admission symptoms, develop shortness of breath, life threatening emergency, suicidal or homicidal thoughts you must seek medical attention immediately by calling 911 or calling your MD immediately  if symptoms less severe.  You Must read complete instructions/literature along with all the possible adverse reactions/side effects for all the Medicines you take and that have been prescribed to you. Take any new Medicines after you have completely understood and accept all the possible adverse reactions/side effects.   Please note  You were cared for by a hospitalist during your hospital stay. If you have any questions about your discharge medications or the care you received while you were in the hospital after you are discharged, you can call the unit and asked to speak with the hospitalist on call if the hospitalist that took care of you is not available. Once you are discharged, your primary care physician will handle any further medical issues. Please note that NO REFILLS for any discharge medications will be authorized once you are discharged, as it is imperative that you return to your primary care physician (or establish a relationship with a primary care physician if you do not have one) for your aftercare needs so that they can reassess your need for medications and monitor your lab values.    Today   SUBJECTIVE   No complaint.   VITAL SIGNS:  Blood pressure 132/68, pulse 94, temperature 98.2 F (36.8 C), temperature source Oral, resp. rate  18, height 5\' 2"  (1.575 m), weight 56.065 kg (123 lb 9.6 oz), SpO2 100 %.  I/O:   Intake/Output Summary (Last 24 hours) at 05/14/15 1456 Last data filed at 05/14/15 1406  Gross per 24 hour  Intake    480 ml  Output   2575 ml  Net  -2095 ml    PHYSICAL EXAMINATION:  GENERAL:  55 y.o.-year-old patient lying in the bed with no acute distress.  EYES: Pupils equal, round, reactive to light and accommodation. No scleral icterus. Extraocular muscles intact.  HEENT: Head atraumatic, normocephalic. Oropharynx and nasopharynx clear.  NECK:  Supple, no jugular venous distention. No thyroid enlargement, no tenderness.  LUNGS: Diminished breath sounds bilaterally, no wheezing, rales,rhonchi or crepitation. No use of accessory muscles of respiration.  CARDIOVASCULAR: S1, S2 normal. No murmurs, rubs, or gallops.  ABDOMEN: Soft, non-tender, non-distended. Bowel sounds present. No organomegaly or mass.  EXTREMITIES: No pedal edema, cyanosis, or clubbing.  NEUROLOGIC: Cranial nerves II through XII are intact. Muscle strength 5/5 in all extremities. Sensation intact. Gait not checked.  PSYCHIATRIC: The patient is alert and oriented x 3.  SKIN: No obvious rash, lesion, or ulcer.   DATA REVIEW:   CBC  Recent Labs Lab 05/10/15  0543  WBC 8.8  HGB 11.9*  HCT 35.2  PLT 260    Chemistries   Recent Labs Lab 05/10/15 0543 05/13/15 0508  NA 134*  --   K 3.6  --   CL 101  --   CO2 33*  --   GLUCOSE 96  --   BUN 19  --   CREATININE 0.46  0.48 0.45  CALCIUM 8.4*  --   MG 2.0  --     Cardiac Enzymes No results for input(s): TROPONINI in the last 168 hours.  Microbiology Results  Results for orders placed or performed during the hospital encounter of 05/06/15  MRSA PCR Screening     Status: None   Collection Time: 05/06/15  5:43 PM  Result Value Ref Range Status   MRSA by PCR NEGATIVE NEGATIVE Final    Comment:        The GeneXpert MRSA Assay (FDA approved for NASAL specimens only),  is one component of a comprehensive MRSA colonization surveillance program. It is not intended to diagnose MRSA infection nor to guide or monitor treatment for MRSA infections.     RADIOLOGY:  No results found.      Management plans discussed with the patient, family and they are in agreement.  CODE STATUS:     Code Status Orders        Start     Ordered   05/06/15 1705  Full code   Continuous     05/06/15 1711    Code Status History    Date Active Date Inactive Code Status Order ID Comments User Context   12/08/2014  9:33 PM 12/10/2014  3:11 PM Full Code [id number]  Theodoro Grist, MD Inpatient   07/15/2014  9:24 PM 07/17/2014  2:06 PM Full Code [id number]  Idelle Crouch, MD Inpatient      TOTAL TIME TAKING CARE OF THIS PATIENT: 42 minutes.    Demetrios Loll M.D on 05/14/2015 at 2:56 PM  Between 7am to 6pm - Pager - [phone]  After 6pm go to [email or web address] - password [email or web address]  Office  [phone]  CC: Primary care physician; Letta Median, MD

## 2015-05-14 NOTE — Progress Notes (Signed)
Called to room and patient was complaining stating she can't breathe. Oxygen saturation 93-95% on 2L nasal canula. Patient requested inhalers, given. Also coached patient on breathing techniques which seemed to help. Patient requesting ativan, will give and continue to assess.

## 2015-05-14 NOTE — Care Management Important Message (Signed)
Important Message  Patient Details  Name: [name] [name] MRN: [id number] Date of Birth: 13-Jan-1961   Medicare Important Message Given:  Yes    Juliann Pulse A Keiley Levey 05/14/2015, 10:23 AM

## 2015-05-14 NOTE — Treatment Plan (Signed)
Verbal order for Home Bipap settings per Dr. Bridgett Larsson as follows: IPAP-12 EPAP-6 Liter flow- 3 lpm

## 2015-05-14 NOTE — Progress Notes (Signed)
CBG for 8AM resulted at 77. Results review shows 6AM, time is incorrect in chart.

## 2015-05-14 NOTE — Care Management (Signed)
BiPAP funding received through the [employer]. [facility] notified. [facility] to deliver Bipap to home TODAY. Patient updated.

## 2015-05-15 ENCOUNTER — Telehealth: Payer: Self-pay | Admitting: Internal Medicine

## 2015-05-15 NOTE — Telephone Encounter (Signed)
Pt daughter calling stating that we need to do prior on [place] with [facility]  For patient needs to start taking it, was given to her by Dr Mortimer Fries in hospital . Please advise.

## 2015-05-15 NOTE — Telephone Encounter (Signed)
Called [facility] and ask them to fax the PA to the office. Will await fax.

## 2015-05-21 NOTE — Telephone Encounter (Signed)
Submitted PA for [facility] through [facility]. Key: [id number]  Will await response.  [place] (p) [phone]   (f) [phone]

## 2015-05-22 NOTE — Telephone Encounter (Signed)
Received form from [employer]. Filled out and faxed back to [employer].

## 2015-05-22 NOTE — Telephone Encounter (Signed)
[name] [name] has been approved until 02/03/2016. [id number]  Pharmacy informed.

## 2015-05-31 ENCOUNTER — Encounter: Payer: Self-pay | Admitting: Internal Medicine

## 2015-05-31 ENCOUNTER — Inpatient Hospital Stay: Payer: Medicare Other | Admitting: Internal Medicine

## 2015-05-31 ENCOUNTER — Ambulatory Visit (INDEPENDENT_AMBULATORY_CARE_PROVIDER_SITE_OTHER): Payer: Medicare Other | Admitting: Internal Medicine

## 2015-05-31 VITALS — BP 132/70 | HR 74 | Ht 62.0 in | Wt 129.0 lb

## 2015-05-31 DIAGNOSIS — J309 Allergic rhinitis, unspecified: Secondary | ICD-10-CM

## 2015-05-31 DIAGNOSIS — F411 Generalized anxiety disorder: Secondary | ICD-10-CM

## 2015-05-31 DIAGNOSIS — J439 Emphysema, unspecified: Secondary | ICD-10-CM | POA: Diagnosis not present

## 2015-05-31 MED ORDER — ALPRAZOLAM 1 MG PO TABS: 0.5000 mg | ORAL_TABLET | Freq: Three times a day (TID) | ORAL | Status: DC | PRN

## 2015-05-31 MED ORDER — FLUTICASONE PROPIONATE 50 MCG/ACT NA SUSP: 1.0000 | Freq: Every day | NASAL | Status: DC

## 2015-05-31 MED ORDER — PREDNISONE 20 MG PO TABS
ORAL_TABLET | ORAL | Status: DC
Start: 2015-05-31 — End: 2015-07-10

## 2015-05-31 MED ORDER — FEXOFENADINE HCL 30 MG PO TABS: 60.0000 mg | ORAL_TABLET | Freq: Two times a day (BID) | ORAL | Status: DC

## 2015-05-31 NOTE — Patient Instructions (Signed)
Chronic Obstructive Pulmonary Disease Chronic obstructive pulmonary disease (COPD) is a common lung condition in which airflow from the lungs is limited. COPD is a general term that can be used to describe many different lung problems that limit airflow, including both chronic bronchitis and emphysema. If you have COPD, your lung function will probably never return to normal, but there are measures you can take to improve lung function and make yourself feel better. CAUSES   Smoking (common).  Exposure to secondhand smoke.  Genetic problems.  Chronic inflammatory lung diseases or recurrent infections. SYMPTOMS  Shortness of breath, especially with physical activity.  Deep, persistent (chronic) cough with a large amount of thick mucus.  Wheezing.  Rapid breaths (tachypnea).  Gray or bluish discoloration (cyanosis) of the skin, especially in your fingers, toes, or lips.  Fatigue.  Weight loss.  Frequent infections or episodes when breathing symptoms become much worse (exacerbations).  Chest tightness. DIAGNOSIS Your health care provider will take a medical history and perform a physical examination to diagnose COPD. Additional tests for COPD may include:  Lung (pulmonary) function tests.  Chest X-ray.  CT scan.  Blood tests. TREATMENT  Treatment for COPD may include:  Inhaler and nebulizer medicines. These help manage the symptoms of COPD and make your breathing more comfortable.  Supplemental oxygen. Supplemental oxygen is only helpful if you have a low oxygen level in your blood.  Exercise and physical activity. These are beneficial for nearly all people with COPD.  Lung surgery or transplant.  Nutrition therapy to gain weight, if you are underweight.  Pulmonary rehabilitation. This may involve working with a team of health care providers and specialists, such as respiratory, occupational, and physical therapists. HOME CARE INSTRUCTIONS  Take all medicines  (inhaled or pills) as directed by your health care provider.  Avoid over-the-counter medicines or cough syrups that dry up your airway (such as antihistamines) and slow down the elimination of secretions unless instructed otherwise by your health care provider.  If you are a smoker, the most important thing that you can do is stop smoking. Continuing to smoke will cause further lung damage and breathing trouble. Ask your health care provider for help with quitting smoking. He or she can direct you to community resources or hospitals that provide support.  Avoid exposure to irritants such as smoke, chemicals, and fumes that aggravate your breathing.  Use oxygen therapy and pulmonary rehabilitation if directed by your health care provider. If you require home oxygen therapy, ask your health care provider whether you should purchase a pulse oximeter to measure your oxygen level at home.  Avoid contact with individuals who have a contagious illness.  Avoid extreme temperature and humidity changes.  Eat healthy foods. Eating smaller, more frequent meals and resting before meals may help you maintain your strength.  Stay active, but balance activity with periods of rest. Exercise and physical activity will help you maintain your ability to do things you want to do.  Preventing infection and hospitalization is very important when you have COPD. Make sure to receive all the vaccines your health care provider recommends, especially the pneumococcal and influenza vaccines. Ask your health care provider whether you need a pneumonia vaccine.  Learn and use relaxation techniques to manage stress.  Learn and use controlled breathing techniques as directed by your health care provider. Controlled breathing techniques include:  Pursed lip breathing. Start by breathing in (inhaling) through your nose for 1 second. Then, purse your lips as if you were   going to whistle and breathe out (exhale) through the  pursed lips for 2 seconds.  Diaphragmatic breathing. Start by putting one hand on your abdomen just above your waist. Inhale slowly through your nose. The hand on your abdomen should move out. Then purse your lips and exhale slowly. You should be able to feel the hand on your abdomen moving in as you exhale.  Learn and use controlled coughing to clear mucus from your lungs. Controlled coughing is a series of short, progressive coughs. The steps of controlled coughing are: 1. Lean your head slightly forward. 2. Breathe in deeply using diaphragmatic breathing. 3. Try to hold your breath for 3 seconds. 4. Keep your mouth slightly open while coughing twice. 5. Spit any mucus out into a tissue. 6. Rest and repeat the steps once or twice as needed. SEEK MEDICAL CARE IF:  You are coughing up more mucus than usual.  There is a change in the color or thickness of your mucus.  Your breathing is more labored than usual.  Your breathing is faster than usual. SEEK IMMEDIATE MEDICAL CARE IF:  You have shortness of breath while you are resting.  You have shortness of breath that prevents you from:  Being able to talk.  Performing your usual physical activities.  You have chest pain lasting longer than 5 minutes.  Your skin color is more cyanotic than usual.  You measure low oxygen saturations for longer than 5 minutes with a pulse oximeter. MAKE SURE YOU:  Understand these instructions.  Will watch your condition.  Will get help right away if you are not doing well or get worse.   This information is not intended to replace advice given to you by your health care provider. Make sure you discuss any questions you have with your health care provider.   Document Released: 10/30/2004 Document Revised: 02/10/2014 Document Reviewed: 09/16/2012 Elsevier Interactive Patient Education 2016 Elsevier Inc.  

## 2015-05-31 NOTE — Progress Notes (Signed)
[place] [facility] Consultation     Date: 05/31/2015,   MRN# [id number] [name] [name] [birth year] Code Status:  Code Status History    Date Active Date Inactive Code Status Order ID Comments User Context   05/06/2015  5:11 PM 05/14/2015  5:49 PM Full Code [id number]  Wilhelmina Mcardle, MD ED   12/08/2014  9:33 PM 12/10/2014  3:11 PM Full Code [id number]  Theodoro Grist, MD Inpatient   07/15/2014  9:24 PM 07/17/2014  2:06 PM Full Code [id number]  Idelle Crouch, MD Inpatient     Hosp day:@LENGTHOFSTAYDAYS @ Referring MD: @ATDPROV @     PCP:      AdmissionWeight: 129 lb (58.514 kg)                 CurrentWeight: 129 lb (58.514 kg) [name] [name] is a 55 y.o. old female seen in consultation for copd and hospital follow up.     CHIEF COMPLAINT:   SOB,DOE   HISTORY OF PRESENT ILLNESS   55 yo AAF seen today for hospital follow up for COPD exacerbation Patient recently admitted for pneumonia and severe resp failure requiring aggressive biPAP therapy, patient was NOT intubated Patient states that she is doing well now, remains on prednisone 40 daily Patient on nocturnal biPAP with oxygen therapy  Patient remains SOB, DOE seems to be at her baseline Quit tobacco use with recent admission On dulera and spiriva and albuterol as needed  Home Medication:  Current Outpatient Rx  Name  Route  Sig  Dispense  Refill  . albuterol (PROVENTIL HFA;VENTOLIN HFA) 108 (90 Base) MCG/ACT inhaler   Inhalation   Inhale 2 puffs into the lungs every 6 (six) hours as needed for wheezing or shortness of breath.   1 Inhaler   2   . ALPRAZolam (XANAX) 0.5 MG tablet   Oral   Take 1 tablet (0.5 mg total) by mouth 3 (three) times daily as needed for anxiety.   21 tablet   0   . aspirin 81 MG EC tablet   Oral   Take 1 tablet (81 mg total) by mouth daily.   30 tablet   1   . guaiFENesin (MUCINEX) 600 MG 12 hr tablet   Oral   Take 1 tablet (600 mg total) by mouth 2 (two) times  daily as needed for cough or to loosen phlegm.   20 tablet   0   . ipratropium-albuterol (DUONEB) 0.5-2.5 (3) MG/3ML SOLN   Nebulization   Take 3 mLs by nebulization every 4 (four) hours as needed (for wheezing/shortness of breath).   360 mL   0   . mometasone-formoterol (DULERA) 200-5 MCG/ACT AERO   Inhalation   Inhale 2 puffs into the lungs 2 (two) times daily.   13 g   0   . montelukast (SINGULAIR) 10 MG tablet   Oral   Take 1 tablet (10 mg total) by mouth every morning.   30 tablet   2   . OXYGEN   Inhalation   Inhale 2 L/min into the lungs at bedtime.         . predniSONE (DELTASONE) 20 MG tablet   Oral   Take 2 tablets (40 mg total) by mouth daily with breakfast.   40 tablet   0   . QUEtiapine (SEROQUEL) 50 MG tablet   Oral   Take 1 tablet (50 mg total) by mouth at bedtime.   30 tablet   0   . tiotropium (SPIRIVA)  18 MCG inhalation capsule   Inhalation   Place 1 capsule (18 mcg total) into inhaler and inhale daily.   30 capsule   2     Current Medication:   Current outpatient prescriptions:  .  albuterol (PROVENTIL HFA;VENTOLIN HFA) 108 (90 Base) MCG/ACT inhaler, Inhale 2 puffs into the lungs every 6 (six) hours as needed for wheezing or shortness of breath., Disp: 1 Inhaler, Rfl: 2 .  ALPRAZolam (XANAX) 0.5 MG tablet, Take 1 tablet (0.5 mg total) by mouth 3 (three) times daily as needed for anxiety., Disp: 21 tablet, Rfl: 0 .  aspirin 81 MG EC tablet, Take 1 tablet (81 mg total) by mouth daily., Disp: 30 tablet, Rfl: 1 .  guaiFENesin (MUCINEX) 600 MG 12 hr tablet, Take 1 tablet (600 mg total) by mouth 2 (two) times daily as needed for cough or to loosen phlegm., Disp: 20 tablet, Rfl: 0 .  ipratropium-albuterol (DUONEB) 0.5-2.5 (3) MG/3ML SOLN, Take 3 mLs by nebulization every 4 (four) hours as needed (for wheezing/shortness of breath)., Disp: 360 mL, Rfl: 0 .  mometasone-formoterol (DULERA) 200-5 MCG/ACT AERO, Inhale 2 puffs into the lungs 2 (two) times  daily., Disp: 13 g, Rfl: 0 .  montelukast (SINGULAIR) 10 MG tablet, Take 1 tablet (10 mg total) by mouth every morning., Disp: 30 tablet, Rfl: 2 .  OXYGEN, Inhale 2 L/min into the lungs at bedtime., Disp: , Rfl:  .  predniSONE (DELTASONE) 20 MG tablet, Take 2 tablets (40 mg total) by mouth daily with breakfast., Disp: 40 tablet, Rfl: 0 .  QUEtiapine (SEROQUEL) 50 MG tablet, Take 1 tablet (50 mg total) by mouth at bedtime., Disp: 30 tablet, Rfl: 0 .  tiotropium (SPIRIVA) 18 MCG inhalation capsule, Place 1 capsule (18 mcg total) into inhaler and inhale daily., Disp: 30 capsule, Rfl: 2     ALLERGIES   Penicillins     REVIEW OF SYSTEMS   Review of Systems  Constitutional: Negative for fever, chills, weight loss, malaise/fatigue and diaphoresis.  HENT: Negative for congestion and hearing loss.   Eyes: Negative for blurred vision and double vision.  Respiratory: Positive for shortness of breath and wheezing. Negative for cough, hemoptysis and sputum production.   Cardiovascular: Negative for chest pain, palpitations and orthopnea.  Gastrointestinal: Negative for heartburn, nausea, vomiting and abdominal pain.  Genitourinary: Negative for dysuria and urgency.  Skin: Negative for rash.  Neurological: Negative for dizziness, weakness and headaches.  Endo/Heme/Allergies: Does not bruise/bleed easily.  Psychiatric/Behavioral: Positive for depression. The patient is nervous/anxious.   All other systems reviewed and are negative.    VS: BP 132/70 mmHg  Pulse 74  Ht 5\' 2"  (1.575 m)  Wt 129 lb (58.514 kg)  BMI 23.59 kg/m2  SpO2 93%     PHYSICAL EXAM   Physical Exam  Constitutional: She is oriented to person, place, and time. She appears well-developed and well-nourished. No distress.  HENT:  Head: Normocephalic and atraumatic.  Mouth/Throat: No oropharyngeal exudate.  Eyes: EOM are normal. Pupils are equal, round, and reactive to light. No scleral icterus.  Neck: Normal range of  motion. Neck supple.  Cardiovascular: Normal rate, regular rhythm and normal heart sounds.   No murmur heard. Pulmonary/Chest: No stridor. No respiratory distress. She has no wheezes.  Abdominal: Soft. Bowel sounds are normal.  Musculoskeletal: Normal range of motion. She exhibits no edema.  Neurological: She is alert and oriented to person, place, and time. No cranial nerve deficit.  Skin: Skin is warm. She is  not diaphoretic.  Psychiatric: She has a normal mood and affect.             ASSESSMENT/PLAN  55 yo AAF seen today for hospital follow up for COPD exacerbation  1.continue biPAP at night with oxygen therapy 2 L [place] 2.will taper prednisone over next several months will decrease to 20 mg daily fro 30 days and then follow up in one month 3.continue dulera and spiriva 4.continue albuterol as needed needed 5.smoking cessation needs to be continued 6.follow up with PFT and 6MWT 7.treatment fro allergic rhinitis-flonase and allegra   Follow up in 3 months   The Patient requires high complexity decision making for assessment and support, frequent evaluation and titration of therapies, application of advanced monitoring technologies and extensive interpretation of multiple databases.   Patient/Family are satisfied with Plan of action and management. All questions answered   [name], M.D.  [facility]  Medical Director [place] Director [facility] Cardio-Pulmonary Department

## 2015-06-04 ENCOUNTER — Inpatient Hospital Stay: Payer: Medicare Other | Admitting: Internal Medicine

## 2015-06-04 ENCOUNTER — Ambulatory Visit (INDEPENDENT_AMBULATORY_CARE_PROVIDER_SITE_OTHER): Payer: Medicare Other | Admitting: *Deleted

## 2015-06-04 DIAGNOSIS — R0600 Dyspnea, unspecified: Secondary | ICD-10-CM

## 2015-06-04 MED ORDER — TIOTROPIUM BROMIDE MONOHYDRATE 18 MCG IN CAPS: 18.0000 ug | ORAL_CAPSULE | Freq: Every day | RESPIRATORY_TRACT | Status: DC

## 2015-06-04 MED ORDER — MOMETASONE FURO-FORMOTEROL FUM 200-5 MCG/ACT IN AERO: 2.0000 | INHALATION_SPRAY | Freq: Two times a day (BID) | RESPIRATORY_TRACT | Status: DC

## 2015-06-04 MED ORDER — ALBUTEROL SULFATE HFA 108 (90 BASE) MCG/ACT IN AERS: 2.0000 | INHALATION_SPRAY | Freq: Four times a day (QID) | RESPIRATORY_TRACT | Status: DC | PRN

## 2015-06-04 NOTE — Progress Notes (Signed)
SMW performed today. 

## 2015-06-13 ENCOUNTER — Ambulatory Visit (INDEPENDENT_AMBULATORY_CARE_PROVIDER_SITE_OTHER): Payer: Medicare Other | Admitting: *Deleted

## 2015-06-13 DIAGNOSIS — J439 Emphysema, unspecified: Secondary | ICD-10-CM | POA: Diagnosis not present

## 2015-06-13 LAB — PULMONARY FUNCTION TEST
DL/VA % PRED: 70 %
DL/VA: 3.22 ml/min/mmHg/L
DLCO UNC: 8.03 ml/min/mmHg
DLCO unc % pred: 37 %
FEF 25-75 Post: 0.2 L/sec
FEF 25-75 Pre: 0.23 L/sec
FEF2575-%Change-Post: -14 %
FEF2575-%Pred-Post: 9 %
FEF2575-%Pred-Pre: 11 %
FEV1-%CHANGE-POST: -3 %
FEV1-%PRED-POST: 27 %
FEV1-%PRED-PRE: 28 %
FEV1-Post: 0.55 L
FEV1-Pre: 0.57 L
FEV1FVC-%Change-Post: 1 %
FEV1FVC-%Pred-Pre: 60 %
FEV6-%Change-Post: -4 %
FEV6-%PRED-PRE: 48 %
FEV6-%Pred-Post: 45 %
FEV6-POST: 1.11 L
FEV6-PRE: 1.17 L
FEV6FVC-%Change-Post: 0 %
FEV6FVC-%PRED-POST: 102 %
FEV6FVC-%Pred-Pre: 102 %
FVC-%Change-Post: -4 %
FVC-%PRED-PRE: 47 %
FVC-%Pred-Post: 44 %
FVC-POST: 1.13 L
FVC-PRE: 1.19 L
POST FEV6/FVC RATIO: 98 %
PRE FEV6/FVC RATIO: 98 %
Post FEV1/FVC ratio: 49 %
Pre FEV1/FVC ratio: 48 %

## 2015-06-13 NOTE — Progress Notes (Signed)
PFT performed today with Nitrogen washout. 

## 2015-07-06 ENCOUNTER — Telehealth: Payer: Self-pay | Admitting: Internal Medicine

## 2015-07-06 DIAGNOSIS — J439 Emphysema, unspecified: Secondary | ICD-10-CM

## 2015-07-06 NOTE — Telephone Encounter (Signed)
°*  STAT* If patient is at the pharmacy, call can be transferred to refill team.   1. Which medications need to be refilled? (please list name of each medication and dose if known) Preidsone   2. Which pharmacy/location (including street and city if local pharmacy) is medication to be sent to? [facility] in [place]   3. Do they need a 30 day or 90 day supply? 90 day   Pt states she is completely out. Would need this called in today.

## 2015-07-09 NOTE — Telephone Encounter (Signed)
Per your note you only want pt to take Prednisone for 30 days. Pt is due to f/u on 08/31/15 with you. Please advise if you want Prednisone cont. Thanks.

## 2015-07-10 MED ORDER — PREDNISONE 20 MG PO TABS: ORAL_TABLET | ORAL | Status: DC

## 2015-07-10 NOTE — Telephone Encounter (Signed)
Prednisone 20 mg daily , needs follow up appointment sooner

## 2015-07-10 NOTE — Telephone Encounter (Signed)
appt scheduled by [facility] for 07/13/15. Prednisone sent to pharmacy. Nothing further needed.

## 2015-07-13 ENCOUNTER — Other Ambulatory Visit: Payer: Self-pay | Admitting: Internal Medicine

## 2015-07-13 ENCOUNTER — Ambulatory Visit (INDEPENDENT_AMBULATORY_CARE_PROVIDER_SITE_OTHER): Payer: Medicare Other | Admitting: Internal Medicine

## 2015-07-13 ENCOUNTER — Encounter: Payer: Self-pay | Admitting: Internal Medicine

## 2015-07-13 VITALS — BP 112/84 | HR 101 | Wt 130.0 lb

## 2015-07-13 DIAGNOSIS — J449 Chronic obstructive pulmonary disease, unspecified: Secondary | ICD-10-CM | POA: Diagnosis not present

## 2015-07-13 MED ORDER — IPRATROPIUM-ALBUTEROL 0.5-2.5 (3) MG/3ML IN SOLN: 3.0000 mL | RESPIRATORY_TRACT | Status: DC | PRN

## 2015-07-13 MED ORDER — AZITHROMYCIN 250 MG PO TABS: ORAL_TABLET | ORAL | Status: AC

## 2015-07-13 NOTE — Addendum Note (Signed)
Addended by: Oscar La R on: 07/13/2015 03:35 PM   Modules accepted: Orders

## 2015-07-13 NOTE — Progress Notes (Addendum)
[facility] Consultation     Date: 07/13/2015,   MRN# [id number] [name] [name] 1960-10-23 Code Status:  Code Status History    Date Active Date Inactive Code Status Order ID Comments User Context   05/06/2015  5:11 PM 05/14/2015  5:49 PM Full Code [id number]  Wilhelmina Mcardle, MD ED   12/08/2014  9:33 PM 12/10/2014  3:11 PM Full Code [id number]  Theodoro Grist, MD Inpatient   07/15/2014  9:24 PM 07/17/2014  2:06 PM Full Code [id number]  Idelle Crouch, MD Inpatient     Hosp day:@LENGTHOFSTAYDAYS @ Referring MD: @ATDPROV @     PCP:      AdmissionWeight: 130 lb (58.968 kg)                 CurrentWeight: 130 lb (58.968 kg) [name] [name] is a 55 y.o. old female seen in consultation for copd and hospital follow up.     CHIEF COMPLAINT:   Follow up SOB,DOE   HISTORY OF PRESENT ILLNESS   Follow up for COPD  on biPAP, on oxygen with exertion and at night  Patient remains SOB, DOE seems to be at her baseline Re-started  tobacco use with recent stress-grand daughter with brain cancer On dulera and spiriva and albuterol as needed No signs infection at this time  PFT 06/2015 Ratio 48% Fev1 28% DLCO 37%  6MWT abnormal o2 sat below 87%    Current Medication:   Current outpatient prescriptions:  .  albuterol (PROVENTIL HFA;VENTOLIN HFA) 108 (90 Base) MCG/ACT inhaler, Inhale 2 puffs into the lungs every 6 (six) hours as needed for wheezing or shortness of breath., Disp: 2 Inhaler, Rfl: 0 .  ALPRAZolam (XANAX) 1 MG tablet, Take 0.5 tablets (0.5 mg total) by mouth 3 (three) times daily as needed for anxiety., Disp: 30 tablet, Rfl: 5 .  aspirin 81 MG EC tablet, Take 1 tablet (81 mg total) by mouth daily., Disp: 30 tablet, Rfl: 1 .  fexofenadine (ALLEGRA) 30 MG tablet, Take 2 tablets (60 mg total) by mouth 2 (two) times daily., Disp: 60 tablet, Rfl: 5 .  fluticasone (FLONASE) 50 MCG/ACT nasal spray, Place 1 spray into both nostrils daily., Disp: 1 g, Rfl: 5 .   guaiFENesin (MUCINEX) 600 MG 12 hr tablet, Take 1 tablet (600 mg total) by mouth 2 (two) times daily as needed for cough or to loosen phlegm., Disp: 20 tablet, Rfl: 0 .  ipratropium-albuterol (DUONEB) 0.5-2.5 (3) MG/3ML SOLN, Take 3 mLs by nebulization every 4 (four) hours as needed (for wheezing/shortness of breath)., Disp: 360 mL, Rfl: 0 .  mometasone-formoterol (DULERA) 200-5 MCG/ACT AERO, Inhale 2 puffs into the lungs 2 (two) times daily., Disp: 39 g, Rfl: 0 .  montelukast (SINGULAIR) 10 MG tablet, Take 1 tablet (10 mg total) by mouth every morning., Disp: 30 tablet, Rfl: 2 .  OXYGEN, Inhale 2 L/min into the lungs at bedtime., Disp: , Rfl:  .  predniSONE (DELTASONE) 20 MG tablet, One tablet daily for 30 days, Disp: 30 tablet, Rfl: 0 .  QUEtiapine (SEROQUEL) 50 MG tablet, Take 1 tablet (50 mg total) by mouth at bedtime., Disp: 30 tablet, Rfl: 0 .  tiotropium (SPIRIVA) 18 MCG inhalation capsule, Place 1 capsule (18 mcg total) into inhaler and inhale daily., Disp: 90 capsule, Rfl: 0     ALLERGIES   Penicillins     REVIEW OF SYSTEMS   Review of Systems  Constitutional: Negative for fever, chills, weight loss and malaise/fatigue.  HENT: Negative  for congestion.   Respiratory: Positive for shortness of breath. Negative for cough, hemoptysis, sputum production and wheezing.   Cardiovascular: Negative for chest pain, palpitations, orthopnea and leg swelling.  Gastrointestinal: Negative for heartburn, nausea and vomiting.  Psychiatric/Behavioral: Positive for depression. The patient is not nervous/anxious.   All other systems reviewed and are negative.    VS: BP 112/84 mmHg  Pulse 101  Wt 130 lb (58.968 kg)  SpO2 100%     PHYSICAL EXAM   Physical Exam  Constitutional: She is oriented to person, place, and time. No distress.  Cardiovascular: Normal rate, regular rhythm and normal heart sounds.   No murmur heard. Pulmonary/Chest: Effort normal and breath sounds normal. No  stridor. No respiratory distress. She has no wheezes. She has no rales.  Musculoskeletal: Normal range of motion. She exhibits no edema.  Neurological: She is alert and oriented to person, place, and time.  Skin: Skin is warm. She is not diaphoretic.  Psychiatric: She has a normal mood and affect.       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 She seems to be at baseline resp status with the current treatment and management.    1.continue biPAP at night with oxygen therapy 2 L [place] 2.will start daily zithromycin to prevent COPD exacerbations 3.continue dulera and spiriva 4.continue albuterol as needed needed 5.smoking cessation strongly advised 6.treatment fro allergic rhinitis-flonase and allegra    Follow up in 4 weeks   The Patient requires high complexity decision making for assessment and support, frequent evaluation and titration of therapies, application of advanced monitoring technologies and extensive interpretation of multiple databases.   Patient/Family are satisfied with Plan of action and management. All questions answered   [name], M.D.  [facility]  Medical Director [place] Director [facility] Cardio-Pulmonary Department

## 2015-07-13 NOTE — Patient Instructions (Signed)
Chronic Obstructive Pulmonary Disease Chronic obstructive pulmonary disease (COPD) is a common lung condition in which airflow from the lungs is limited. COPD is a general term that can be used to describe many different lung problems that limit airflow, including both chronic bronchitis and emphysema. If you have COPD, your lung function will probably never return to normal, but there are measures you can take to improve lung function and make yourself feel better. CAUSES   Smoking (common).  Exposure to secondhand smoke.  Genetic problems.  Chronic inflammatory lung diseases or recurrent infections. SYMPTOMS  Shortness of breath, especially with physical activity.  Deep, persistent (chronic) cough with a large amount of thick mucus.  Wheezing.  Rapid breaths (tachypnea).  Gray or bluish discoloration (cyanosis) of the skin, especially in your fingers, toes, or lips.  Fatigue.  Weight loss.  Frequent infections or episodes when breathing symptoms become much worse (exacerbations).  Chest tightness. DIAGNOSIS Your health care provider will take a medical history and perform a physical examination to diagnose COPD. Additional tests for COPD may include:  Lung (pulmonary) function tests.  Chest X-ray.  CT scan.  Blood tests. TREATMENT  Treatment for COPD may include:  Inhaler and nebulizer medicines. These help manage the symptoms of COPD and make your breathing more comfortable.  Supplemental oxygen. Supplemental oxygen is only helpful if you have a low oxygen level in your blood.  Exercise and physical activity. These are beneficial for nearly all people with COPD.  Lung surgery or transplant.  Nutrition therapy to gain weight, if you are underweight.  Pulmonary rehabilitation. This may involve working with a team of health care providers and specialists, such as respiratory, occupational, and physical therapists. HOME CARE INSTRUCTIONS  Take all medicines  (inhaled or pills) as directed by your health care provider.  Avoid over-the-counter medicines or cough syrups that dry up your airway (such as antihistamines) and slow down the elimination of secretions unless instructed otherwise by your health care provider.  If you are a smoker, the most important thing that you can do is stop smoking. Continuing to smoke will cause further lung damage and breathing trouble. Ask your health care provider for help with quitting smoking. He or she can direct you to community resources or hospitals that provide support.  Avoid exposure to irritants such as smoke, chemicals, and fumes that aggravate your breathing.  Use oxygen therapy and pulmonary rehabilitation if directed by your health care provider. If you require home oxygen therapy, ask your health care provider whether you should purchase a pulse oximeter to measure your oxygen level at home.  Avoid contact with individuals who have a contagious illness.  Avoid extreme temperature and humidity changes.  Eat healthy foods. Eating smaller, more frequent meals and resting before meals may help you maintain your strength.  Stay active, but balance activity with periods of rest. Exercise and physical activity will help you maintain your ability to do things you want to do.  Preventing infection and hospitalization is very important when you have COPD. Make sure to receive all the vaccines your health care provider recommends, especially the pneumococcal and influenza vaccines. Ask your health care provider whether you need a pneumonia vaccine.  Learn and use relaxation techniques to manage stress.  Learn and use controlled breathing techniques as directed by your health care provider. Controlled breathing techniques include:  Pursed lip breathing. Start by breathing in (inhaling) through your nose for 1 second. Then, purse your lips as if you were   going to whistle and breathe out (exhale) through the  pursed lips for 2 seconds.  Diaphragmatic breathing. Start by putting one hand on your abdomen just above your waist. Inhale slowly through your nose. The hand on your abdomen should move out. Then purse your lips and exhale slowly. You should be able to feel the hand on your abdomen moving in as you exhale.  Learn and use controlled coughing to clear mucus from your lungs. Controlled coughing is a series of short, progressive coughs. The steps of controlled coughing are: 1. Lean your head slightly forward. 2. Breathe in deeply using diaphragmatic breathing. 3. Try to hold your breath for 3 seconds. 4. Keep your mouth slightly open while coughing twice. 5. Spit any mucus out into a tissue. 6. Rest and repeat the steps once or twice as needed. SEEK MEDICAL CARE IF:  You are coughing up more mucus than usual.  There is a change in the color or thickness of your mucus.  Your breathing is more labored than usual.  Your breathing is faster than usual. SEEK IMMEDIATE MEDICAL CARE IF:  You have shortness of breath while you are resting.  You have shortness of breath that prevents you from:  Being able to talk.  Performing your usual physical activities.  You have chest pain lasting longer than 5 minutes.  Your skin color is more cyanotic than usual.  You measure low oxygen saturations for longer than 5 minutes with a pulse oximeter. MAKE SURE YOU:  Understand these instructions.  Will watch your condition.  Will get help right away if you are not doing well or get worse.   This information is not intended to replace advice given to you by your health care provider. Make sure you discuss any questions you have with your health care provider.   Document Released: 10/30/2004 Document Revised: 02/10/2014 Document Reviewed: 09/16/2012 Elsevier Interactive Patient Education 2016 Elsevier Inc.  

## 2015-08-09 ENCOUNTER — Encounter (INDEPENDENT_AMBULATORY_CARE_PROVIDER_SITE_OTHER): Payer: Self-pay

## 2015-08-09 ENCOUNTER — Encounter: Payer: Self-pay | Admitting: Internal Medicine

## 2015-08-09 ENCOUNTER — Ambulatory Visit (INDEPENDENT_AMBULATORY_CARE_PROVIDER_SITE_OTHER): Payer: Medicare Other | Admitting: Internal Medicine

## 2015-08-09 VITALS — BP 114/80 | HR 73 | Wt 126.0 lb

## 2015-08-09 DIAGNOSIS — J439 Emphysema, unspecified: Secondary | ICD-10-CM | POA: Diagnosis not present

## 2015-08-09 MED ORDER — TIOTROPIUM BROMIDE MONOHYDRATE 18 MCG IN CAPS: 18.0000 ug | ORAL_CAPSULE | Freq: Every day | RESPIRATORY_TRACT | Status: DC

## 2015-08-09 MED ORDER — CETIRIZINE HCL 10 MG PO TABS: 10.0000 mg | ORAL_TABLET | Freq: Every day | ORAL | Status: DC

## 2015-08-09 NOTE — Progress Notes (Signed)
[facility] Consultation     Date: 08/09/2015,   MRN# [id number] [name] [name] 1960-08-06 Code Status:  Code Status History    Date Active Date Inactive Code Status Order ID Comments User Context   05/06/2015  5:11 PM 05/14/2015  5:49 PM Full Code [id number]  Wilhelmina Mcardle, MD ED   12/08/2014  9:33 PM 12/10/2014  3:11 PM Full Code [id number]  Theodoro Grist, MD Inpatient   07/15/2014  9:24 PM 07/17/2014  2:06 PM Full Code [id number]  Idelle Crouch, MD Inpatient     [facility] day:@LENGTHOFSTAYDAYS @ Referring MD: @ATDPROV @     PCP:      AdmissionWeight: 126 lb (57.153 kg)                 CurrentWeight: 126 lb (57.153 kg) [name] [name] is a 55 y.o. old female seen in consultation for copd and hospital follow up.     CHIEF COMPLAINT:   Follow up SOB,DOE   HISTORY OF PRESENT ILLNESS   Follow up for COPD  Patient on biPAP, on oxygen with exertion and at night  Patient remains SOB, DOE seems to be at her baseline On dulera and spiriva and albuterol as needed No signs infection at this time Doing well on daily azithromycin therapy  PFT 06/2015 Ratio 48% Fev1 28% DLCO 37%  6MWT abnormal o2 sat below 87%    Current Medication:   Current outpatient prescriptions:  .  albuterol (PROVENTIL HFA;VENTOLIN HFA) 108 (90 Base) MCG/ACT inhaler, Inhale 2 puffs into the lungs every 6 (six) hours as needed for wheezing or shortness of breath., Disp: 2 Inhaler, Rfl: 0 .  ALPRAZolam (XANAX) 1 MG tablet, Take 0.5 tablets (0.5 mg total) by mouth 3 (three) times daily as needed for anxiety., Disp: 30 tablet, Rfl: 5 .  aspirin 81 MG EC tablet, Take 1 tablet (81 mg total) by mouth daily., Disp: 30 tablet, Rfl: 1 .  fluticasone (FLONASE) 50 MCG/ACT nasal spray, Place 1 spray into both nostrils daily., Disp: 1 g, Rfl: 5 .  guaiFENesin (MUCINEX) 600 MG 12 hr tablet, Take 1 tablet (600 mg total) by mouth 2 (two) times daily as needed for cough or to loosen phlegm., Disp:  20 tablet, Rfl: 0 .  ipratropium-albuterol (DUONEB) 0.5-2.5 (3) MG/3ML SOLN, Take 3 mLs by nebulization every 4 (four) hours as needed (for wheezing/shortness of breath)., Disp: 360 mL, Rfl: 2 .  mometasone-formoterol (DULERA) 200-5 MCG/ACT AERO, Inhale 2 puffs into the lungs 2 (two) times daily., Disp: 39 g, Rfl: 0 .  OXYGEN, Inhale 2 L/min into the lungs at bedtime., Disp: , Rfl:  .  predniSONE (DELTASONE) 20 MG tablet, One tablet daily for 30 days, Disp: 30 tablet, Rfl: 0 .  QUEtiapine (SEROQUEL) 50 MG tablet, Take 1 tablet (50 mg total) by mouth at bedtime., Disp: 30 tablet, Rfl: 0 .  tiotropium (SPIRIVA) 18 MCG inhalation capsule, Place 1 capsule (18 mcg total) into inhaler and inhale daily., Disp: 90 capsule, Rfl: 0     ALLERGIES   Penicillins     REVIEW OF SYSTEMS   Review of Systems  Constitutional: Negative for fever, chills, weight loss and malaise/fatigue.  HENT: Negative for congestion.   Respiratory: Positive for shortness of breath. Negative for cough, hemoptysis, sputum production and wheezing.   Cardiovascular: Negative for chest pain, palpitations, orthopnea and leg swelling.  Gastrointestinal: Negative for heartburn, nausea and vomiting.  Psychiatric/Behavioral: Positive for depression. The patient is not nervous/anxious.  All other systems reviewed and are negative.    VS: BP 114/80 mmHg  Pulse 73  Wt 126 lb (57.153 kg)  SpO2 90%     PHYSICAL EXAM   Physical Exam  Constitutional: She is oriented to person, place, and time. No distress.  Cardiovascular: Normal rate, regular rhythm and normal heart sounds.   No murmur heard. Pulmonary/Chest: Effort normal and breath sounds normal. No stridor. No respiratory distress. She has no wheezes. She has no rales.  Musculoskeletal: Normal range of motion. She exhibits no edema.  Neurological: She is alert and oriented to person, place, and time.  Skin: Skin is warm. She is not diaphoretic.  Psychiatric: She has  a normal mood and affect.       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   Patient are satisfied with Plan of action and management. All questions answered   [name], M.D.  [facility]  Medical Director [place] Director [facility] Cardio-Pulmonary Department

## 2015-08-09 NOTE — Patient Instructions (Signed)
1.start zyrtec 2.continue azithromycin 3.continue dulera and spirive 4.oxygen as needed  Chronic Obstructive Pulmonary Disease Chronic obstructive pulmonary disease (COPD) is a common lung condition in which airflow from the lungs is limited. COPD is a general term that can be used to describe many different lung problems that limit airflow, including both chronic bronchitis and emphysema. If you have COPD, your lung function will probably never return to normal, but there are measures you can take to improve lung function and make yourself feel better. CAUSES   Smoking (common).  Exposure to secondhand smoke.  Genetic problems.  Chronic inflammatory lung diseases or recurrent infections. SYMPTOMS  Shortness of breath, especially with physical activity.  Deep, persistent (chronic) cough with a large amount of thick mucus.  Wheezing.  Rapid breaths (tachypnea).  Gray or bluish discoloration (cyanosis) of the skin, especially in your fingers, toes, or lips.  Fatigue.  Weight loss.  Frequent infections or episodes when breathing symptoms become much worse (exacerbations).  Chest tightness. DIAGNOSIS Your health care provider will take a medical history and perform a physical examination to diagnose COPD. Additional tests for COPD may include:  Lung (pulmonary) function tests.  Chest X-ray.  CT scan.  Blood tests. TREATMENT  Treatment for COPD may include:  Inhaler and nebulizer medicines. These help manage the symptoms of COPD and make your breathing more comfortable.  Supplemental oxygen. Supplemental oxygen is only helpful if you have a low oxygen level in your blood.  Exercise and physical activity. These are beneficial for nearly all people with COPD.  Lung surgery or transplant.  Nutrition therapy to gain weight, if you are underweight.  Pulmonary rehabilitation. This may involve working with a team of health care providers and specialists, such as  respiratory, occupational, and physical therapists. HOME CARE INSTRUCTIONS  Take all medicines (inhaled or pills) as directed by your health care provider.  Avoid over-the-counter medicines or cough syrups that dry up your airway (such as antihistamines) and slow down the elimination of secretions unless instructed otherwise by your health care provider.  If you are a smoker, the most important thing that you can do is stop smoking. Continuing to smoke will cause further lung damage and breathing trouble. Ask your health care provider for help with quitting smoking. He or she can direct you to community resources or hospitals that provide support.  Avoid exposure to irritants such as smoke, chemicals, and fumes that aggravate your breathing.  Use oxygen therapy and pulmonary rehabilitation if directed by your health care provider. If you require home oxygen therapy, ask your health care provider whether you should purchase a pulse oximeter to measure your oxygen level at home.  Avoid contact with individuals who have a contagious illness.  Avoid extreme temperature and humidity changes.  Eat healthy foods. Eating smaller, more frequent meals and resting before meals may help you maintain your strength.  Stay active, but balance activity with periods of rest. Exercise and physical activity will help you maintain your ability to do things you want to do.  Preventing infection and hospitalization is very important when you have COPD. Make sure to receive all the vaccines your health care provider recommends, especially the pneumococcal and influenza vaccines. Ask your health care provider whether you need a pneumonia vaccine.  Learn and use relaxation techniques to manage stress.  Learn and use controlled breathing techniques as directed by your health care provider. Controlled breathing techniques include:  Pursed lip breathing. Start by breathing in (inhaling) through your  nose for 1  second. Then, purse your lips as if you were going to whistle and breathe out (exhale) through the pursed lips for 2 seconds.  Diaphragmatic breathing. Start by putting one hand on your abdomen just above your waist. Inhale slowly through your nose. The hand on your abdomen should move out. Then purse your lips and exhale slowly. You should be able to feel the hand on your abdomen moving in as you exhale.  Learn and use controlled coughing to clear mucus from your lungs. Controlled coughing is a series of short, progressive coughs. The steps of controlled coughing are: 1. Lean your head slightly forward. 2. Breathe in deeply using diaphragmatic breathing. 3. Try to hold your breath for 3 seconds. 4. Keep your mouth slightly open while coughing twice. 5. Spit any mucus out into a tissue. 6. Rest and repeat the steps once or twice as needed. SEEK MEDICAL CARE IF:  You are coughing up more mucus than usual.  There is a change in the color or thickness of your mucus.  Your breathing is more labored than usual.  Your breathing is faster than usual. SEEK IMMEDIATE MEDICAL CARE IF:  You have shortness of breath while you are resting.  You have shortness of breath that prevents you from:  Being able to talk.  Performing your usual physical activities.  You have chest pain lasting longer than 5 minutes.  Your skin color is more cyanotic than usual.  You measure low oxygen saturations for longer than 5 minutes with a pulse oximeter. MAKE SURE YOU:  Understand these instructions.  Will watch your condition.  Will get help right away if you are not doing well or get worse.   This information is not intended to replace advice given to you by your health care provider. Make sure you discuss any questions you have with your health care provider.   Document Released: 10/30/2004 Document Revised: 02/10/2014 Document Reviewed: 09/16/2012 Elsevier Interactive Patient Education [employer]

## 2015-08-31 ENCOUNTER — Ambulatory Visit: Payer: Medicare Other | Admitting: Internal Medicine

## 2015-09-19 ENCOUNTER — Encounter: Payer: Self-pay | Admitting: *Deleted

## 2015-09-19 ENCOUNTER — Ambulatory Visit: Payer: Medicare Other | Admitting: Internal Medicine

## 2015-10-01 ENCOUNTER — Other Ambulatory Visit: Payer: Self-pay | Admitting: Internal Medicine

## 2015-10-01 ENCOUNTER — Encounter: Payer: Self-pay | Admitting: Internal Medicine

## 2015-10-01 ENCOUNTER — Ambulatory Visit (INDEPENDENT_AMBULATORY_CARE_PROVIDER_SITE_OTHER): Payer: Medicare Other | Admitting: Internal Medicine

## 2015-10-01 DIAGNOSIS — F411 Generalized anxiety disorder: Secondary | ICD-10-CM

## 2015-10-01 DIAGNOSIS — J439 Emphysema, unspecified: Secondary | ICD-10-CM

## 2015-10-01 MED ORDER — TIOTROPIUM BROMIDE MONOHYDRATE 18 MCG IN CAPS: 18.0000 ug | ORAL_CAPSULE | Freq: Every day | RESPIRATORY_TRACT | 3 refills | Status: DC

## 2015-10-01 MED ORDER — PREDNISONE 20 MG PO TABS: ORAL_TABLET | ORAL | 12 refills | Status: DC

## 2015-10-01 MED ORDER — ALPRAZOLAM 1 MG PO TABS: 0.5000 mg | ORAL_TABLET | Freq: Three times a day (TID) | ORAL | 5 refills | Status: DC | PRN

## 2015-10-01 NOTE — Patient Instructions (Signed)
Continue dulera/Spiriva Albuterol as needed Prednisone 10 mg daily Continue Oxygen at night and BiPAP at night  Chronic Obstructive Pulmonary Disease Chronic obstructive pulmonary disease (COPD) is a common lung condition in which airflow from the lungs is limited. COPD is a general term that can be used to describe many different lung problems that limit airflow, including both chronic bronchitis and emphysema. If you have COPD, your lung function will probably never return to normal, but there are measures you can take to improve lung function and make yourself feel better. CAUSES   Smoking (common).  Exposure to secondhand smoke.  Genetic problems.  Chronic inflammatory lung diseases or recurrent infections. SYMPTOMS  Shortness of breath, especially with physical activity.  Deep, persistent (chronic) cough with a large amount of thick mucus.  Wheezing.  Rapid breaths (tachypnea).  Gray or bluish discoloration (cyanosis) of the skin, especially in your fingers, toes, or lips.  Fatigue.  Weight loss.  Frequent infections or episodes when breathing symptoms become much worse (exacerbations).  Chest tightness. DIAGNOSIS Your health care provider will take a medical history and perform a physical examination to diagnose COPD. Additional tests for COPD may include:  Lung (pulmonary) function tests.  Chest X-ray.  CT scan.  Blood tests. TREATMENT  Treatment for COPD may include:  Inhaler and nebulizer medicines. These help manage the symptoms of COPD and make your breathing more comfortable.  Supplemental oxygen. Supplemental oxygen is only helpful if you have a low oxygen level in your blood.  Exercise and physical activity. These are beneficial for nearly all people with COPD.  Lung surgery or transplant.  Nutrition therapy to gain weight, if you are underweight.  Pulmonary rehabilitation. This may involve working with a team of health care providers and  specialists, such as respiratory, occupational, and physical therapists. HOME CARE INSTRUCTIONS  Take all medicines (inhaled or pills) as directed by your health care provider.  Avoid over-the-counter medicines or cough syrups that dry up your airway (such as antihistamines) and slow down the elimination of secretions unless instructed otherwise by your health care provider.  If you are a smoker, the most important thing that you can do is stop smoking. Continuing to smoke will cause further lung damage and breathing trouble. Ask your health care provider for help with quitting smoking. He or she can direct you to community resources or hospitals that provide support.  Avoid exposure to irritants such as smoke, chemicals, and fumes that aggravate your breathing.  Use oxygen therapy and pulmonary rehabilitation if directed by your health care provider. If you require home oxygen therapy, ask your health care provider whether you should purchase a pulse oximeter to measure your oxygen level at home.  Avoid contact with individuals who have a contagious illness.  Avoid extreme temperature and humidity changes.  Eat healthy foods. Eating smaller, more frequent meals and resting before meals may help you maintain your strength.  Stay active, but balance activity with periods of rest. Exercise and physical activity will help you maintain your ability to do things you want to do.  Preventing infection and hospitalization is very important when you have COPD. Make sure to receive all the vaccines your health care provider recommends, especially the pneumococcal and influenza vaccines. Ask your health care provider whether you need a pneumonia vaccine.  Learn and use relaxation techniques to manage stress.  Learn and use controlled breathing techniques as directed by your health care provider. Controlled breathing techniques include:  Pursed lip breathing. Start  by breathing in (inhaling) through  your nose for 1 second. Then, purse your lips as if you were going to whistle and breathe out (exhale) through the pursed lips for 2 seconds.  Diaphragmatic breathing. Start by putting one hand on your abdomen just above your waist. Inhale slowly through your nose. The hand on your abdomen should move out. Then purse your lips and exhale slowly. You should be able to feel the hand on your abdomen moving in as you exhale.  Learn and use controlled coughing to clear mucus from your lungs. Controlled coughing is a series of short, progressive coughs. The steps of controlled coughing are: 1. Lean your head slightly forward. 2. Breathe in deeply using diaphragmatic breathing. 3. Try to hold your breath for 3 seconds. 4. Keep your mouth slightly open while coughing twice. 5. Spit any mucus out into a tissue. 6. Rest and repeat the steps once or twice as needed. SEEK MEDICAL CARE IF:  You are coughing up more mucus than usual.  There is a change in the color or thickness of your mucus.  Your breathing is more labored than usual.  Your breathing is faster than usual. SEEK IMMEDIATE MEDICAL CARE IF:  You have shortness of breath while you are resting.  You have shortness of breath that prevents you from:  Being able to talk.  Performing your usual physical activities.  You have chest pain lasting longer than 5 minutes.  Your skin color is more cyanotic than usual.  You measure low oxygen saturations for longer than 5 minutes with a pulse oximeter. MAKE SURE YOU:  Understand these instructions.  Will watch your condition.  Will get help right away if you are not doing well or get worse.   This information is not intended to replace advice given to you by your health care provider. Make sure you discuss any questions you have with your health care provider.   Document Released: 10/30/2004 Document Revised: 02/10/2014 Document Reviewed: 09/16/2012 Elsevier Interactive Patient  Education [employer].

## 2015-10-01 NOTE — Progress Notes (Signed)
[facility] Consultation     Date: 10/01/2015,   MRN# [id number] LADEJAH SULA [birth year] Code Status:  Code Status History    Date Active Date Inactive Code Status Order ID Comments User Context   05/06/2015  5:11 PM 05/14/2015  5:49 PM Full Code [id number]  Wilhelmina Mcardle, MD ED   12/08/2014  9:33 PM 12/10/2014  3:11 PM Full Code [id number]  Theodoro Grist, MD Inpatient   07/15/2014  9:24 PM 07/17/2014  2:06 PM Full Code [id number]  Idelle Crouch, MD Inpatient     Hosp day:@LENGTHOFSTAYDAYS @ Referring MD: @ATDPROV @     PCP:      AdmissionWeight: 125 lb 3.2 oz (56.8 kg)                 CurrentWeight: 125 lb 3.2 oz (56.8 kg) Ronnette L Hosford is a 55 y.o. old female seen in consultation for copd and hospital follow up.     CHIEF COMPLAINT:   Follow up SOB,DOE   HISTORY OF PRESENT ILLNESS   Follow up for COPD  Patient on biPAP, on oxygen with exertion and at night-patient benefits from this therapy and is preventing recurrent admissions  Patient remains SOB, DOE seems to be at her baseline She DID not tolerate azithromycin and has restarted oral prednisone On dulera and spiriva and albuterol as needed No signs infection at this time  PFT 06/2015 Ratio 48% Fev1 28% DLCO 37%  6MWT abnormal o2 sat below 87%    Current Medication:   Current Outpatient Prescriptions:  .  ALPRAZolam (XANAX) 1 MG tablet, Take 0.5 tablets (0.5 mg total) by mouth 3 (three) times daily as needed for anxiety., Disp: 30 tablet, Rfl: 5 .  aspirin 81 MG EC tablet, Take 1 tablet (81 mg total) by mouth daily., Disp: 30 tablet, Rfl: 1 .  cetirizine (ZYRTEC ALLERGY) 10 MG tablet, Take 1 tablet (10 mg total) by mouth daily., Disp: 30 tablet, Rfl: 3 .  DULERA 200-5 MCG/ACT AERO, INHALE 2 PUFFS BY MOUTH TWICE DAILY, Disp: 39 g, Rfl: 0 .  fluticasone (FLONASE) 50 MCG/ACT nasal spray, Place 1 spray into both nostrils daily., Disp: 1 g, Rfl: 5 .  guaiFENesin (MUCINEX) 600 MG 12  hr tablet, Take 1 tablet (600 mg total) by mouth 2 (two) times daily as needed for cough or to loosen phlegm., Disp: 20 tablet, Rfl: 0 .  ipratropium-albuterol (DUONEB) 0.5-2.5 (3) MG/3ML SOLN, Take 3 mLs by nebulization every 4 (four) hours as needed (for wheezing/shortness of breath)., Disp: 360 mL, Rfl: 2 .  OXYGEN, Inhale 2 L/min into the lungs at bedtime., Disp: , Rfl:  .  predniSONE (DELTASONE) 20 MG tablet, One tablet daily for 30 days, Disp: 30 tablet, Rfl: 0 .  PROAIR HFA 108 (90 Base) MCG/ACT inhaler, INHALE 2 PUFFS BY MOUTH INTO THE LUNGS EVERY 6 HOURS AS NEEDED FOR WHEEZING OR SHORTNESS OF BREATH, Disp: 17 g, Rfl: 0 .  QUEtiapine (SEROQUEL) 50 MG tablet, Take 1 tablet (50 mg total) by mouth at bedtime., Disp: 30 tablet, Rfl: 0 .  tiotropium (SPIRIVA) 18 MCG inhalation capsule, Place 1 capsule (18 mcg total) into inhaler and inhale daily., Disp: 90 capsule, Rfl: 0     ALLERGIES   Penicillins     REVIEW OF SYSTEMS   Review of Systems  Constitutional: Negative for chills, fever, malaise/fatigue and weight loss.  HENT: Negative for congestion.   Respiratory: Positive for shortness of breath. Negative for cough, hemoptysis, sputum production  and wheezing.   Cardiovascular: Negative for chest pain, palpitations, orthopnea and leg swelling.  Gastrointestinal: Negative for heartburn, nausea and vomiting.  Psychiatric/Behavioral: Negative for depression. The patient is not nervous/anxious.   All other systems reviewed and are negative.    VS: BP 128/74 (BP Location: Left Arm, Cuff Size: Normal)   Pulse 100   Ht 5\' 2"  (1.575 m)   Wt 125 lb 3.2 oz (56.8 kg)   SpO2 92%   BMI 22.90 kg/m      PHYSICAL EXAM   Physical Exam  Constitutional: She is oriented to person, place, and time. No distress.  Cardiovascular: Normal rate, regular rhythm and normal heart sounds.   No murmur heard. Pulmonary/Chest: Effort normal and breath sounds normal. No stridor. No respiratory  distress. She has no wheezes. She has no rales.  Musculoskeletal: Normal range of motion. She exhibits no edema.  Neurological: She is alert and oriented to person, place, and time.  Skin: Skin is warm. She is not diaphoretic.  Psychiatric: She has a normal mood and affect.       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  Patient needs/requires noninvasive ventilation   1.continue biPAP at night with oxygen therapy 2 L [place] also oxygen with exertion 2.will restart prednisone 10 mg daily prevent COPD exacerbations 3.continue dulera and spiriva 4.continue albuterol as needed needed 5.smoking cessation strongly advised 6.treatment fro allergic rhinitis-flonase and allegra    Follow up in 3 months   The Patient requires high complexity decision making for assessment and support, frequent evaluation and titration of therapies, application of advanced monitoring technologies and extensive interpretation of multiple databases.   Patient are satisfied with Plan of action and management. All questions answered   [name], M.D.  [facility]  Medical Director [place] Director [facility] Cardio-Pulmonary Department

## 2015-10-31 ENCOUNTER — Inpatient Hospital Stay
Admission: EM | Admit: 2015-10-31 | Discharge: 2015-11-02 | DRG: 190 | Disposition: A | Discharge status: Home / Self Care | Payer: Medicare Other | Attending: Specialist | Admitting: Specialist

## 2015-10-31 ENCOUNTER — Emergency Department: Payer: Medicare Other

## 2015-10-31 DIAGNOSIS — Z880 Allergy status to penicillin: Secondary | ICD-10-CM

## 2015-10-31 DIAGNOSIS — Z7952 Long term (current) use of systemic steroids: Secondary | ICD-10-CM | POA: Diagnosis not present

## 2015-10-31 DIAGNOSIS — Z803 Family history of malignant neoplasm of breast: Secondary | ICD-10-CM | POA: Diagnosis not present

## 2015-10-31 DIAGNOSIS — Z79899 Other long term (current) drug therapy: Secondary | ICD-10-CM | POA: Diagnosis not present

## 2015-10-31 DIAGNOSIS — F329 Major depressive disorder, single episode, unspecified: Secondary | ICD-10-CM | POA: Diagnosis present

## 2015-10-31 DIAGNOSIS — F419 Anxiety disorder, unspecified: Secondary | ICD-10-CM | POA: Diagnosis present

## 2015-10-31 DIAGNOSIS — Z87891 Personal history of nicotine dependence: Secondary | ICD-10-CM | POA: Diagnosis not present

## 2015-10-31 DIAGNOSIS — Z9981 Dependence on supplemental oxygen: Secondary | ICD-10-CM | POA: Diagnosis not present

## 2015-10-31 DIAGNOSIS — J9622 Acute and chronic respiratory failure with hypercapnia: Secondary | ICD-10-CM | POA: Diagnosis present

## 2015-10-31 DIAGNOSIS — J9692 Respiratory failure, unspecified with hypercapnia: Secondary | ICD-10-CM | POA: Diagnosis present

## 2015-10-31 DIAGNOSIS — Z881 Allergy status to other antibiotic agents status: Secondary | ICD-10-CM | POA: Diagnosis not present

## 2015-10-31 DIAGNOSIS — Z7982 Long term (current) use of aspirin: Secondary | ICD-10-CM | POA: Diagnosis not present

## 2015-10-31 DIAGNOSIS — J441 Chronic obstructive pulmonary disease with (acute) exacerbation: Secondary | ICD-10-CM | POA: Diagnosis present

## 2015-10-31 LAB — BLOOD GAS, VENOUS
Acid-Base Excess: 6.6 mmol/L — ABNORMAL HIGH (ref 0.0–2.0)
Acid-Base Excess: 5.7 mmol/L — ABNORMAL HIGH (ref 0.0–2.0)
BICARBONATE: 35.9 mmol/L — AB (ref 20.0–28.0)
Bicarbonate: 36.1 mmol/L — ABNORMAL HIGH (ref 20.0–28.0)
DELIVERY SYSTEMS: POSITIVE
Delivery systems: POSITIVE
FIO2: 0.5
FIO2: 0.5
O2 SAT: 98.2 %
O2 Saturation: 97.4 %
PCO2 VEN: 75 mmHg — AB (ref 44.0–60.0)
PH VEN: 7.26 (ref 7.250–7.430)
PO2 VEN: 118 mmHg — AB (ref 32.0–45.0)
Patient temperature: 37
Patient temperature: 37
pCO2, Ven: 80 mmHg (ref 44.0–60.0)
pH, Ven: 7.29 (ref 7.250–7.430)
pO2, Ven: 108 mmHg — ABNORMAL HIGH (ref 32.0–45.0)

## 2015-10-31 LAB — BASIC METABOLIC PANEL
Anion gap: 7 (ref 5–15)
BUN: 10 mg/dL (ref 6–20)
CO2: 32 mmol/L (ref 22–32)
Calcium: 8.9 mg/dL (ref 8.9–10.3)
Chloride: 98 mmol/L — ABNORMAL LOW (ref 101–111)
Creatinine, Ser: 0.64 mg/dL (ref 0.44–1.00)
GFR calc Af Amer: >60 mL/min (ref >60)
GLUCOSE: 88 mg/dL (ref 65–99)
POTASSIUM: 4 mmol/L (ref 3.5–5.1)
Sodium: 137 mmol/L (ref 135–145)

## 2015-10-31 LAB — CBC
HCT: 41.7 % (ref 35.0–47.0)
Hemoglobin: 14.1 g/dL (ref 12.0–16.0)
MCH: 28.9 pg (ref 26.0–34.0)
MCHC: 33.7 g/dL (ref 32.0–36.0)
MCV: 85.7 fL (ref 80.0–100.0)
PLATELETS: 238 10*3/uL (ref 150–440)
RBC: 4.87 MIL/uL (ref 3.80–5.20)
RDW: 13 % (ref 11.5–14.5)
WBC: 10.2 10*3/uL (ref 3.6–11.0)

## 2015-10-31 LAB — GLUCOSE, CAPILLARY: Glucose-Capillary: 237 mg/dL — ABNORMAL HIGH (ref 65–99)

## 2015-10-31 LAB — TSH: TSH: 0.87 u[IU]/mL (ref 0.350–4.500)

## 2015-10-31 LAB — MRSA PCR SCREENING: MRSA BY PCR: NEGATIVE

## 2015-10-31 LAB — TROPONIN I: Troponin I: <0.03 ng/mL (ref <0.03)

## 2015-10-31 MED ORDER — QUETIAPINE FUMARATE 25 MG PO TABS
50.0000 mg | ORAL_TABLET | Freq: Every day | ORAL | Status: DC
  Filled 2015-10-31: qty 2

## 2015-10-31 MED ORDER — ENOXAPARIN SODIUM 40 MG/0.4ML ~~LOC~~ SOLN
40.0000 mg | SUBCUTANEOUS | Status: DC
  Administered 2015-10-31 – 2015-11-01 (×2): 40 mg via SUBCUTANEOUS
  Filled 2015-10-31 (×2): qty 0.4

## 2015-10-31 MED ORDER — PREDNISONE 20 MG PO TABS
30.0000 mg | ORAL_TABLET | Freq: Every day | ORAL | Status: AC
  Administered 2015-11-02: 30 mg via ORAL
  Filled 2015-10-31: qty 1

## 2015-10-31 MED ORDER — MOMETASONE FURO-FORMOTEROL FUM 200-5 MCG/ACT IN AERO
2.0000 | INHALATION_SPRAY | Freq: Two times a day (BID) | RESPIRATORY_TRACT | Status: DC
  Administered 2015-10-31 – 2015-11-02 (×5): 2 via RESPIRATORY_TRACT
  Filled 2015-10-31: qty 8.8

## 2015-10-31 MED ORDER — ALBUTEROL SULFATE (2.5 MG/3ML) 0.083% IN NEBU
2.5000 mg | INHALATION_SOLUTION | RESPIRATORY_TRACT | Status: DC
  Administered 2015-10-31 – 2015-11-01 (×7): 2.5 mg via RESPIRATORY_TRACT
  Filled 2015-10-31 (×6): qty 3

## 2015-10-31 MED ORDER — PREDNISONE 20 MG PO TABS
40.0000 mg | ORAL_TABLET | Freq: Every day | ORAL | Status: AC
  Administered 2015-11-01: 40 mg via ORAL
  Filled 2015-10-31: qty 2

## 2015-10-31 MED ORDER — PREDNISONE 20 MG PO TABS
50.0000 mg | ORAL_TABLET | Freq: Every day | ORAL | Status: AC
  Administered 2015-10-31: 50 mg via ORAL
  Filled 2015-10-31: qty 1

## 2015-10-31 MED ORDER — MAGNESIUM SULFATE 2 GM/50ML IV SOLN
2.0000 g | Freq: Once | INTRAVENOUS | Status: AC
  Administered 2015-10-31: 2 g via INTRAVENOUS
  Filled 2015-10-31: qty 50

## 2015-10-31 MED ORDER — DEXTROSE 5 % IV SOLN
500.0000 mg | Freq: Once | INTRAVENOUS | Status: AC
  Administered 2015-10-31: 500 mg via INTRAVENOUS
  Filled 2015-10-31: qty 500

## 2015-10-31 MED ORDER — SODIUM CHLORIDE 0.9 % IV BOLUS (SEPSIS)
500.0000 mL | Freq: Once | INTRAVENOUS | Status: AC
  Administered 2015-10-31: 500 mL via INTRAVENOUS

## 2015-10-31 MED ORDER — ALBUTEROL SULFATE (2.5 MG/3ML) 0.083% IN NEBU
INHALATION_SOLUTION | RESPIRATORY_TRACT | Status: AC
  Filled 2015-10-31: qty 3

## 2015-10-31 MED ORDER — DOCUSATE SODIUM 100 MG PO CAPS
100.0000 mg | ORAL_CAPSULE | Freq: Two times a day (BID) | ORAL | Status: DC
  Administered 2015-10-31 – 2015-11-02 (×5): 100 mg via ORAL
  Filled 2015-10-31 (×5): qty 1

## 2015-10-31 MED ORDER — PREDNISONE 10 MG PO TABS: 10.0000 mg | ORAL_TABLET | Freq: Every day | ORAL | Status: DC

## 2015-10-31 MED ORDER — SODIUM CHLORIDE 0.9% FLUSH
3.0000 mL | Freq: Two times a day (BID) | INTRAVENOUS | Status: DC
  Administered 2015-10-31 – 2015-11-02 (×5): 3 mL via INTRAVENOUS

## 2015-10-31 MED ORDER — ALBUTEROL SULFATE (2.5 MG/3ML) 0.083% IN NEBU
2.5000 mg | INHALATION_SOLUTION | Freq: Once | RESPIRATORY_TRACT | Status: AC
  Administered 2015-10-31: 2.5 mg via RESPIRATORY_TRACT
  Filled 2015-10-31: qty 3

## 2015-10-31 MED ORDER — LEVOFLOXACIN 500 MG PO TABS
500.0000 mg | ORAL_TABLET | Freq: Every day | ORAL | Status: DC
  Administered 2015-11-01 – 2015-11-02 (×2): 500 mg via ORAL
  Filled 2015-10-31 (×2): qty 1

## 2015-10-31 MED ORDER — SERTRALINE HCL 25 MG PO TABS
25.0000 mg | ORAL_TABLET | Freq: Every day | ORAL | Status: DC
  Administered 2015-10-31 – 2015-11-01 (×2): 25 mg via ORAL
  Filled 2015-10-31 (×3): qty 1

## 2015-10-31 MED ORDER — ONDANSETRON HCL 4 MG PO TABS: 4.0000 mg | ORAL_TABLET | Freq: Four times a day (QID) | ORAL | Status: DC | PRN

## 2015-10-31 MED ORDER — ALPRAZOLAM 0.5 MG PO TABS
0.5000 mg | ORAL_TABLET | Freq: Three times a day (TID) | ORAL | Status: DC | PRN
  Administered 2015-10-31 – 2015-11-02 (×5): 0.5 mg via ORAL
  Filled 2015-10-31 (×5): qty 1

## 2015-10-31 MED ORDER — ASPIRIN EC 81 MG PO TBEC
81.0000 mg | DELAYED_RELEASE_TABLET | Freq: Every day | ORAL | Status: DC
  Administered 2015-10-31 – 2015-11-02 (×3): 81 mg via ORAL
  Filled 2015-10-31 (×3): qty 1

## 2015-10-31 MED ORDER — PREDNISONE 20 MG PO TABS: 20.0000 mg | ORAL_TABLET | Freq: Every day | ORAL | Status: DC

## 2015-10-31 MED ORDER — LORATADINE 10 MG PO TABS
10.0000 mg | ORAL_TABLET | Freq: Every day | ORAL | Status: DC
  Administered 2015-10-31 – 2015-11-02 (×3): 10 mg via ORAL
  Filled 2015-10-31 (×3): qty 1

## 2015-10-31 MED ORDER — ONDANSETRON HCL 4 MG/2ML IJ SOLN: 4.0000 mg | Freq: Four times a day (QID) | INTRAMUSCULAR | Status: DC | PRN

## 2015-10-31 MED ORDER — ACETAMINOPHEN 650 MG RE SUPP: 650.0000 mg | Freq: Four times a day (QID) | RECTAL | Status: DC | PRN

## 2015-10-31 MED ORDER — GUAIFENESIN ER 600 MG PO TB12: 600.0000 mg | ORAL_TABLET | Freq: Two times a day (BID) | ORAL | Status: DC | PRN

## 2015-10-31 MED ORDER — TIOTROPIUM BROMIDE MONOHYDRATE 18 MCG IN CAPS
18.0000 ug | ORAL_CAPSULE | Freq: Every day | RESPIRATORY_TRACT | Status: DC
Start: 2015-10-31 — End: 2015-11-02
  Administered 2015-10-31 – 2015-11-02 (×3): 18 ug via RESPIRATORY_TRACT
  Filled 2015-10-31: qty 5

## 2015-10-31 MED ORDER — ACETAMINOPHEN 325 MG PO TABS: 650.0000 mg | ORAL_TABLET | Freq: Four times a day (QID) | ORAL | Status: DC | PRN

## 2015-10-31 MED ORDER — FLUTICASONE PROPIONATE 50 MCG/ACT NA SUSP
1.0000 | Freq: Every day | NASAL | Status: DC
  Administered 2015-10-31: 1 via NASAL
  Filled 2015-10-31: qty 16

## 2015-10-31 NOTE — Progress Notes (Signed)
Repeat vbg with PCO2 80. Bipap settings adjusted to 15/5  FiO2 decreased to 40%.  Pt. Resting comfortably at this time. No distress noted. Will continue to monitor.

## 2015-10-31 NOTE — ED Triage Notes (Signed)
Patient to ED via EMS for difficulty breathing. On arrival to the scene EMS reports patient was in the tripod position with labored breathing. Initial O2 sat 95% on 2L O2 at home. Patient took one duoneb of her own and was given two duonebs by EMS en route. On arrival to ED patient was breathing easier and is without distress. Also received 125 mg Solu mEdrol and 2 grams mag sulfate from EMS enroute. Patient is currently alert and oriented in NAD.

## 2015-10-31 NOTE — ED Notes (Signed)
Assisted pt up to commode, pt tolerated ambulation well and with steady gait. Pt did not stress any other concerns at this time.

## 2015-10-31 NOTE — Progress Notes (Signed)
1. Acute on chronic respiratory failure with hypercapnea 2. Copd exacerbation  Wean BiPAP as tolerated, continue abx, steroids, nebs

## 2015-10-31 NOTE — H&P (Signed)
[name] [name] is an 55 y.o. female.   Chief Complaint: Shortness of breath HPI: The patient with past medical history of COPD presents emergency department via EMS complaining of shortness of breath. The patient states that she has been coughing and feeling short of breath for at least 2 weeks. She had started on a course of doxycycline 5 days ago and had completed a steroid taper for that. She admits that she feels like she's actually gotten worse since these interventions. The patient was started on BiPAP in the emergency department which made her feel better but has not yet improved her air trapping or hypercapnia which prompted the emergency department staff to call for admission.  Past Medical History:  Diagnosis Date  . Breast cancer ([place])   . Cancer ([place])   . COPD (chronic obstructive pulmonary disease) ([place])     Past Surgical History:  Procedure Laterality Date  . ABDOMINAL HYSTERECTOMY      Family History  Problem Relation Age of Onset  . Breast cancer Mother   . Lung cancer Mother    Social History:  reports that she has quit smoking. She has a 20.00 pack-year smoking history. She has never used smokeless tobacco. She reports that she does not drink alcohol or use drugs.  Allergies:  Allergies  Allergen Reactions  . Penicillins Swelling, Rash and Other (See Comments)    Pt states that she had tongue swelling.   Has patient had a PCN reaction causing immediate rash, facial/tongue/throat swelling, SOB or lightheadedness with hypotension: Yes Has patient had a PCN reaction causing severe rash involving mucus membranes or skin necrosis: No Has patient had a PCN reaction that required hospitalization No Has patient had a PCN reaction occurring within the last 10 years: No If all of the above answers are "NO", then may proceed with Cephalosporin use.    Prior to Admission medications   Medication Sig Start Date End Date Taking? Authorizing Provider  ALPRAZolam Duanne Moron)  1 MG tablet Take 0.5 tablets (0.5 mg total) by mouth 3 (three) times daily as needed for anxiety. 10/01/15  Yes Flora Lipps, MD  aspirin 81 MG EC tablet Take 1 tablet (81 mg total) by mouth daily. 05/14/15  Yes Demetrios Loll, MD  cetirizine (ZYRTEC ALLERGY) 10 MG tablet Take 1 tablet (10 mg total) by mouth daily. 08/09/15  Yes Flora Lipps, MD  doxycycline (VIBRA-TABS) 100 MG tablet Take 100 mg by mouth 2 (two) times daily. 10/24/15  Yes Historical Provider, MD  DULERA 200-5 MCG/ACT AERO INHALE 2 PUFFS BY MOUTH TWICE DAILY 10/01/15  Yes Flora Lipps, MD  fluticasone (FLONASE) 50 MCG/ACT nasal spray Place 1 spray into both nostrils daily. 05/31/15 05/30/16 Yes Flora Lipps, MD  guaiFENesin (MUCINEX) 600 MG 12 hr tablet Take 1 tablet (600 mg total) by mouth 2 (two) times daily as needed for cough or to loosen phlegm. 05/14/15  Yes Demetrios Loll, MD  ipratropium-albuterol (DUONEB) 0.5-2.5 (3) MG/3ML SOLN Take 3 mLs by nebulization every 4 (four) hours as needed (for wheezing/shortness of breath). 07/13/15  Yes Flora Lipps, MD  OXYGEN Inhale 2 L/min into the lungs at bedtime.   Yes Historical Provider, MD  predniSONE (DELTASONE) 20 MG tablet One tablet daily for 30 days 10/01/15  Yes Flora Lipps, MD  PROAIR HFA 108 (90 Base) MCG/ACT inhaler INHALE 2 PUFFS BY MOUTH INTO THE LUNGS EVERY 6 HOURS AS NEEDED FOR WHEEZING OR SHORTNESS OF BREATH 10/01/15  Yes Flora Lipps, MD  QUEtiapine (SEROQUEL) 50  MG tablet Take 1 tablet (50 mg total) by mouth at bedtime. 05/14/15  Yes Demetrios Loll, MD  sertraline (ZOLOFT) 25 MG tablet Take 25 mg by mouth daily.  10/24/15  Yes Historical Provider, MD  tiotropium (SPIRIVA) 18 MCG inhalation capsule Place 1 capsule (18 mcg total) into inhaler and inhale daily. 10/01/15  Yes Flora Lipps, MD     Results for orders placed or performed during the hospital encounter of 10/31/15 (from the past 48 hour(s))  CBC     Status: None   Collection Time: 10/31/15  1:54 AM  Result Value Ref Range   WBC 10.2 3.6 - 11.0  K/uL   RBC 4.87 3.80 - 5.20 MIL/uL   Hemoglobin 14.1 12.0 - 16.0 g/dL   HCT 41.7 35.0 - 47.0 %   MCV 85.7 80.0 - 100.0 fL   MCH 28.9 26.0 - 34.0 pg   MCHC 33.7 32.0 - 36.0 g/dL   RDW 13.0 11.5 - 14.5 %   Platelets 238 150 - 440 K/uL  Basic metabolic panel     Status: Abnormal   Collection Time: 10/31/15  1:54 AM  Result Value Ref Range   Sodium 137 135 - 145 mmol/L   Potassium 4.0 3.5 - 5.1 mmol/L   Chloride 98 (L) 101 - 111 mmol/L   CO2 32 22 - 32 mmol/L   Glucose, Bld 88 65 - 99 mg/dL   BUN 10 6 - 20 mg/dL   Creatinine, Ser 0.64 0.44 - 1.00 mg/dL   Calcium 8.9 8.9 - 10.3 mg/dL   GFR calc non Af Amer >60 >60 mL/min   GFR calc Af Amer >60 >60 mL/min    Comment: (NOTE) The eGFR has been calculated using the CKD EPI equation. This calculation has not been validated in all clinical situations. eGFR's persistently <60 mL/min signify possible Chronic Kidney Disease.    Anion gap 7 5 - 15  Troponin I     Status: None   Collection Time: 10/31/15  1:54 AM  Result Value Ref Range   Troponin I <0.03 <0.03 ng/mL  Blood gas, venous     Status: Abnormal   Collection Time: 10/31/15  2:24 AM  Result Value Ref Range   FIO2 0.50    Delivery systems BILEVEL POSITIVE AIRWAY PRESSURE     Comment: 10/5   pH, Ven 7.29 7.250 - 7.430   pCO2, Ven 75 (HH) 44.0 - 60.0 mmHg    Comment: CRITICAL RESULT CALLED TO, READ BACK BY AND VERIFIED WITH: DR. Alaysiah Browder @ 0300  10/31/15  JCG    pO2, Ven 118.0 (H) 32.0 - 45.0 mmHg   Bicarbonate 36.1 (H) 20.0 - 28.0 mmol/L   Acid-Base Excess 6.6 (H) 0.0 - 2.0 mmol/L   O2 Saturation 98.2 %   Patient temperature 37.0    Collection site VENOUS    Sample type VENOUS   Blood gas, venous     Status: Abnormal   Collection Time: 10/31/15  5:04 AM  Result Value Ref Range   FIO2 0.50    Delivery systems BILEVEL POSITIVE AIRWAY PRESSURE     Comment: 10/5   pH, Ven 7.26 7.250 - 7.430   pCO2, Ven 80 (HH) 44.0 - 60.0 mmHg    Comment: CRITICAL RESULT CALLED TO, READ  BACK BY AND VERIFIED WITH: DR. Dahlia Client  @ [phone]  10/31/2015  JCG    pO2, Ven 108.0 (H) 32.0 - 45.0 mmHg   Bicarbonate 35.9 (H) 20.0 - 28.0 mmol/L   Acid-Base  Excess 5.7 (H) 0.0 - 2.0 mmol/L   O2 Saturation 97.4 %   Patient temperature 37.0    Collection site VENOUS    Sample type VENOUS    Dg Chest Portable 1 View  Result Date: 10/31/2015 CLINICAL DATA:  54 year old female with respiratory distress. EXAM: PORTABLE CHEST 1 VIEW COMPARISON:  Chest radiograph dated 05/07/2015 FINDINGS: Single-view of the chest demonstrate emphysematous changes of the lungs with hyperinflation and flattening of the diaphragm. Bilateral linear and discoid atelectasis/scarring. No focal consolidation, pleural effusion, or pneumothorax. The cardiac silhouette is within normal limits. No acute osseous pathology. IMPRESSION: No active disease. Electronically Signed   By: Anner Crete M.D.   On: 10/31/2015 03:44    Review of Systems  Constitutional: Negative for chills and fever.  HENT: Negative for sore throat and tinnitus.   Eyes: Negative for blurred vision and redness.  Respiratory: Positive for shortness of breath. Negative for cough.   Cardiovascular: Negative for chest pain, palpitations, orthopnea and PND.  Gastrointestinal: Negative for abdominal pain, diarrhea, nausea and vomiting.  Genitourinary: Negative for dysuria, frequency and urgency.  Musculoskeletal: Negative for joint pain and myalgias.  Skin: Negative for rash.       No lesions  Neurological: Negative for speech change, focal weakness and weakness.  Endo/Heme/Allergies: Does not bruise/bleed easily.       No temperature intolerance  Psychiatric/Behavioral: Negative for depression and suicidal ideas.    Blood pressure 114/80, pulse (!) 106, temperature 98 F (36.7 C), temperature source Axillary, resp. rate (!) 22, height _0  (1.575 m), weight 59 kg (130 lb), SpO2 98 %. Physical Exam  Vitals reviewed. Constitutional: She is oriented  to person, place, and time. She appears well-developed and well-nourished.  HENT:  Head: Normocephalic and atraumatic.  Mouth/Throat: Oropharynx is clear and moist.  Eyes: Conjunctivae and EOM are normal. Pupils are equal, round, and reactive to light. No scleral icterus.  Neck: Normal range of motion. Neck supple. No JVD present. No tracheal deviation present. No thyromegaly present.  Cardiovascular: Normal rate, regular rhythm and normal heart sounds.  Exam reveals no gallop and no friction rub.   No murmur heard. Respiratory: Breath sounds normal. She is in respiratory distress.  Poor air movement  GI: Soft. Bowel sounds are normal. She exhibits no distension. There is no tenderness.  Genitourinary:  Genitourinary Comments: Deferred  Musculoskeletal: Normal range of motion. She exhibits no edema.  Lymphadenopathy:    She has no cervical adenopathy.  Neurological: She is alert and oriented to person, place, and time. No cranial nerve deficit. She exhibits normal muscle tone.  Skin: Skin is warm and dry. No rash noted. No erythema.  Psychiatric: She has a normal mood and affect. Her behavior is normal. Judgment and thought content normal.     Assessment/Plan This is a 55 year old female admitted for acute on chronic respiratory distress. 1. Respiratory distress: Acute on chronic with hypercapnia. The patient is received Solu-Medrol in the emergency department. We will continue to taper steroids. Levaquin for anti-inflammatory effect. 2. COPD: Continue inhaled corticosteroid as well as Spiriva. Albuterol as needed. 3. Breast cancer: In remission. Continue tamoxifen 4. Depression: Continue Seroquel and Zoloft 5. DVT prophylaxis: Lovenox 6. GI prophylaxis: None The patient is a full code. Time spent on admission orders and critical care approximately 45 minutes  Harrie Foreman, MD 10/31/2015, 6:24 AM

## 2015-10-31 NOTE — ED Provider Notes (Addendum)
[facility] Emergency Department Provider Note   ____________________________________________   First MD Initiated Contact with Patient 10/31/15 0153     (approximate)  I have reviewed the triage vital signs and the nursing notes.   HISTORY  Chief Complaint Respiratory Distress    HPI [name] [name] is a 55 y.o. female who comes into the hospital today with shortness of breath. The patient reports it started on Wednesday. She reports that she went to the doctor and told her her COPD was acting up. She was given doxycycline but reports that it didn't help. The patient thinks it may have made her symptoms worse. She has been using inhalers at home. The patient thought she would get better but it has not. She thinks she may have had a fever today but she did not take her temperature. The patient denies any chest pain but has had some headache and nausea. She's had no abdominal pain. The patient did receive some Solu-Medrol and was placed on BiPAP by EMS.The patient was also given 2 DuoNeb's by EMS as well. She is here for evaluation.   Past Medical History:  Diagnosis Date  . Breast cancer ([place])   . Cancer ([place])   . COPD (chronic obstructive pulmonary disease) [facility])     Patient Active Problem List   Diagnosis Date Noted  . Anxiety 05/06/2015  . Smoker 05/06/2015  . Emphysema lung ([place]) 05/06/2015  . Acute on chronic respiratory failure ([place]) 07/15/2014  . COPD exacerbation ([place]) 07/15/2014  . Acute bronchitis 07/15/2014  . Tachycardia 07/15/2014    Past Surgical History:  Procedure Laterality Date  . ABDOMINAL HYSTERECTOMY      Prior to Admission medications   Medication Sig Start Date End Date Taking? Authorizing Provider  ALPRAZolam Duanne Moron) 1 MG tablet Take 0.5 tablets (0.5 mg total) by mouth 3 (three) times daily as needed for anxiety. 10/01/15  Yes Flora Lipps, MD  aspirin 81 MG EC tablet Take 1 tablet (81 mg total) by mouth daily.  05/14/15  Yes Demetrios Loll, MD  cetirizine (ZYRTEC ALLERGY) 10 MG tablet Take 1 tablet (10 mg total) by mouth daily. 08/09/15  Yes Flora Lipps, MD  doxycycline (VIBRA-TABS) 100 MG tablet Take 100 mg by mouth 2 (two) times daily. 10/24/15  Yes Historical Provider, MD  DULERA 200-5 MCG/ACT AERO INHALE 2 PUFFS BY MOUTH TWICE DAILY 10/01/15  Yes Flora Lipps, MD  fluticasone (FLONASE) 50 MCG/ACT nasal spray Place 1 spray into both nostrils daily. 05/31/15 05/30/16 Yes Flora Lipps, MD  guaiFENesin (MUCINEX) 600 MG 12 hr tablet Take 1 tablet (600 mg total) by mouth 2 (two) times daily as needed for cough or to loosen phlegm. 05/14/15  Yes Demetrios Loll, MD  ipratropium-albuterol (DUONEB) 0.5-2.5 (3) MG/3ML SOLN Take 3 mLs by nebulization every 4 (four) hours as needed (for wheezing/shortness of breath). 07/13/15  Yes Flora Lipps, MD  OXYGEN Inhale 2 L/min into the lungs at bedtime.   Yes Historical Provider, MD  predniSONE (DELTASONE) 20 MG tablet One tablet daily for 30 days 10/01/15  Yes Flora Lipps, MD  PROAIR HFA 108 (90 Base) MCG/ACT inhaler INHALE 2 PUFFS BY MOUTH INTO THE LUNGS EVERY 6 HOURS AS NEEDED FOR WHEEZING OR SHORTNESS OF BREATH 10/01/15  Yes Flora Lipps, MD  QUEtiapine (SEROQUEL) 50 MG tablet Take 1 tablet (50 mg total) by mouth at bedtime. 05/14/15  Yes Demetrios Loll, MD  sertraline (ZOLOFT) 25 MG tablet Take 25 mg by mouth daily.  10/24/15  Yes Historical Provider, MD  tiotropium (SPIRIVA) 18 MCG inhalation capsule Place 1 capsule (18 mcg total) into inhaler and inhale daily. 10/01/15  Yes Flora Lipps, MD    Allergies Penicillins  Family History  Problem Relation Age of Onset  . Breast cancer Mother   . Lung cancer Mother     Social History Social History  Substance Use Topics  . Smoking status: Former Smoker    Packs/day: 0.50    Years: 40.00  . Smokeless tobacco: Never Used     Comment: quit smoking 09/26/15  . Alcohol use No    Review of Systems Constitutional:  fever Eyes: No visual  changes. ENT: No sore throat. Cardiovascular: Denies chest pain. Respiratory: Cough and shortness of breath. Gastrointestinal: No abdominal pain.  No nausea, no vomiting.  No diarrhea.  No constipation. Genitourinary: Negative for dysuria. Musculoskeletal: Negative for back pain. Skin: Negative for rash. Neurological: Negative for headaches, focal weakness or numbness.  10-point ROS otherwise negative.  ____________________________________________   PHYSICAL EXAM:  VITAL SIGNS: ED Triage Vitals  Enc Vitals Group     BP 10/31/15 0135 125/79     Pulse Rate 10/31/15 0135 (!) 105     Resp 10/31/15 0135 20     Temp 10/31/15 0135 98.2 F (36.8 C)     Temp Source 10/31/15 0135 Axillary     SpO2 10/31/15 0135 98 %     Weight 10/31/15 0136 130 lb (59 kg)     Height 10/31/15 0136 5\' 2"  (1.575 m)     Head Circumference --      Peak Flow --      Pain Score 10/31/15 0136 0     Pain Loc --      Pain Edu? --      Excl. in [place]? --     Constitutional: Alert and oriented. Well appearing and in Moderate distress. Eyes: Conjunctivae are normal. PERRL. EOMI. Head: Atraumatic. Nose: No congestion/rhinnorhea. Mouth/Throat: Mucous membranes are moist.  Oropharynx non-erythematous. Cardiovascular: Tachycardia, regular rhythm. Grossly normal heart sounds.  Good peripheral circulation. Respiratory: Increased respiratory effort.  No retractions. Expiratory wheezes Gastrointestinal: Soft and nontender. No distention. Positive bowel sounds Musculoskeletal: No lower extremity tenderness nor edema.   Neurologic:  Normal speech and language.  Skin:  Skin is warm, dry and intact.  Psychiatric: Mood and affect are normal.   ____________________________________________   LABS (all labs ordered are listed, but only abnormal results are displayed)  Labs Reviewed  BASIC METABOLIC PANEL - Abnormal; Notable for the following:       Result Value   Chloride 98 (*)    All other components within normal  limits  BLOOD GAS, VENOUS - Abnormal; Notable for the following:    pCO2, Ven 75 (*)    pO2, Ven 118.0 (*)    Bicarbonate 36.1 (*)    Acid-Base Excess 6.6 (*)    All other components within normal limits  BLOOD GAS, VENOUS - Abnormal; Notable for the following:    pCO2, Ven 80 (*)    pO2, Ven 108.0 (*)    Bicarbonate 35.9 (*)    Acid-Base Excess 5.7 (*)    All other components within normal limits  CBC  TROPONIN I   ____________________________________________  EKG  ED ECG REPORT I, Loney Hering, the attending physician, personally viewed and interpreted this ECG.   Date: 10/31/2015  EKG Time: 138  Rate: 107  Rhythm: sinus tachycardia  Axis: normal  Intervals:none  ST&T  Change: none  ____________________________________________  RADIOLOGY  CXR ____________________________________________   PROCEDURES  Procedure(s) performed: None  Procedures  Critical Care performed: Yes, see critical care note(s)   CRITICAL CARE Performed by: Charlesetta Ivory P   Total critical care time: 30 minutes  Critical care time was exclusive of separately billable procedures and treating other patients.  Critical care was necessary to treat or prevent imminent or life-threatening deterioration.  Critical care was time spent personally by me on the following activities: development of treatment plan with patient and/or surrogate as well as nursing, discussions with consultants, evaluation of patient's response to treatment, examination of patient, obtaining history from patient or surrogate, ordering and performing treatments and interventions, ordering and review of laboratory studies, ordering and review of radiographic studies, pulse oximetry and re-evaluation of patient's condition.   ____________________________________________   INITIAL IMPRESSION / ASSESSMENT AND PLAN / ED COURSE  Pertinent labs & imaging results that were available during my care of the patient  were reviewed by me and considered in my medical decision making (see chart for details).  This is a 55 year old female who comes in the hospital today with shortness of breath. The patient did receive some Solu-Medrol and DuoNeb by EMS and was placed on BiPAP. It appears that the patient's PCO2 is more elevated than it has been previously. I will await the results of the blood work and imaging and then admit the patient to the hospitalist service.  Clinical Course  Value Comment By Time  DG Chest Portable 1 View No active disease Loney Hering, MD 09/27 0530   The patient was breathing comfortably on the BiPAP. Her wheezing was improved as well. We did watch her for some time and then repeat her blood gas but it did not look much improved. We will increase her pressures give the patient some magnesium sulfate as well as azithromycin and another albuterol treatment. The patient will be admitted to the hospitalist service for COPD exacerbation.  ____________________________________________   FINAL CLINICAL IMPRESSION(S) / ED DIAGNOSES  Final diagnoses:  COPD exacerbation ([place])      NEW MEDICATIONS STARTED DURING THIS VISIT:  New Prescriptions   No medications on file     Note:  This document was prepared using Dragon voice recognition software and may include unintentional dictation errors.    Loney Hering, MD 10/31/15 [id number]    Loney Hering, MD 10/31/15 [phone]

## 2015-10-31 NOTE — Progress Notes (Signed)
Pt. Placed on BIPAP 10/5  Rate 8  FiO2 50% . Pt. tachypneic with increased work of breathing improved with application of BIPAP.  SpO2 100%

## 2015-10-31 NOTE — ED Notes (Signed)
Unsuccessful IV attempt x 2 by this RN. Admitting MD notified.

## 2015-10-31 NOTE — ED Notes (Signed)
EMS IV removed by this RN located on left inside wrist due to IV catheter coming out.

## 2015-10-31 NOTE — Progress Notes (Signed)
Spoke with Dr. Lavetta Nielsen to clarify order for off unit telemetry.  Order discontinued per Dr. Lavetta Nielsen.

## 2015-11-01 LAB — HEMOGLOBIN A1C
Hgb A1c MFr Bld: 5 % (ref 4.8–5.6)
Mean Plasma Glucose: 97 mg/dL

## 2015-11-01 MED ORDER — ALBUTEROL SULFATE (2.5 MG/3ML) 0.083% IN NEBU
2.5000 mg | INHALATION_SOLUTION | Freq: Four times a day (QID) | RESPIRATORY_TRACT | Status: DC
  Administered 2015-11-01 – 2015-11-02 (×5): 2.5 mg via RESPIRATORY_TRACT
  Filled 2015-11-01 (×5): qty 3

## 2015-11-01 NOTE — Progress Notes (Signed)
[place] [facility] [place] NAME: [name] [name]    MR#:  [id number]  DATE OF BIRTH:  May 09, 1960  SUBJECTIVE:   Patient here due to shortness of breath from COPD exacerbation and acute on chronic respiratory failure with hypercapnia. Still having some significant shortness of breath on minimal exertion.  REVIEW OF SYSTEMS:    Review of Systems  Constitutional: Negative for fever and weight loss.  HENT: Negative for congestion, nosebleeds and tinnitus.   Eyes: Negative for blurred vision, double vision and redness.  Respiratory: Positive for shortness of breath. Negative for cough and hemoptysis.   Cardiovascular: Negative for chest pain, orthopnea, leg swelling and PND.  Gastrointestinal: Negative for abdominal pain, diarrhea, melena, nausea and vomiting.  Genitourinary: Negative for dysuria, hematuria and urgency.  Musculoskeletal: Negative for falls and joint pain.  Neurological: Negative for dizziness, tingling, sensory change, focal weakness, seizures, weakness and headaches.  Endo/Heme/Allergies: Negative for polydipsia. Does not bruise/bleed easily.  Psychiatric/Behavioral: Negative for depression and memory loss. The patient is not nervous/anxious.     Nutrition: Regular Tolerating Diet: Yes Tolerating PT: Ambulatory  DRUG ALLERGIES:   Allergies  Allergen Reactions  . Zithromax [Azithromycin] Other (See Comments)    Patient reports that medication makes breathing worse  . Penicillins Swelling, Rash and Other (See Comments)    Pt states that she had tongue swelling.   Has patient had a PCN reaction causing immediate rash, facial/tongue/throat swelling, SOB or lightheadedness with hypotension: Yes Has patient had a PCN reaction causing severe rash involving mucus membranes or skin necrosis: No Has patient had a PCN reaction that required hospitalization No Has patient had a PCN reaction occurring within the last 10 years: No If all  of the above answers are "NO", then may proceed with Cephalosporin use.    VITALS:  Blood pressure 109/70, pulse 94, temperature 97.5 F (36.4 C), temperature source Oral, resp. rate 16, height 5\' 2"  (1.575 m), weight 59.7 kg (131 lb 9.6 oz), SpO2 99 %.  PHYSICAL EXAMINATION:   Physical Exam  GENERAL:  55 y.o.-year-old patient lying in the bed in no acute distress.  EYES: Pupils equal, round, reactive to light and accommodation. No scleral icterus. Extraocular muscles intact.  HEENT: Head atraumatic, normocephalic. Oropharynx and nasopharynx clear.  NECK:  Supple, no jugular venous distention. No thyroid enlargement, no tenderness.  LUNGS: Normal breath sounds bilaterally, no wheezing, rales, rhonchi. No use of accessory muscles of respiration.  CARDIOVASCULAR: S1, S2 normal. No murmurs, rubs, or gallops.  ABDOMEN: Soft, nontender, nondistended. Bowel sounds present. No organomegaly or mass.  EXTREMITIES: No cyanosis, clubbing or edema b/l.    NEUROLOGIC: Cranial nerves II through XII are intact. No focal Motor or sensory deficits b/l.   PSYCHIATRIC: The patient is alert and oriented x 3. Anxious SKIN: No obvious rash, lesion, or ulcer.    LABORATORY PANEL:   CBC  Recent Labs Lab 10/31/15 0154  WBC 10.2  HGB 14.1  HCT 41.7  PLT 238   ------------------------------------------------------------------------------------------------------------------  Chemistries   Recent Labs Lab 10/31/15 0154  NA 137  K 4.0  CL 98*  CO2 32  GLUCOSE 88  BUN 10  CREATININE 0.64  CALCIUM 8.9   ------------------------------------------------------------------------------------------------------------------  Cardiac Enzymes  Recent Labs Lab 10/31/15 0154  TROPONINI <0.03   ------------------------------------------------------------------------------------------------------------------  RADIOLOGY:  Dg Chest Portable 1 View  Result Date: 10/31/2015 CLINICAL DATA:   55 year old female with respiratory distress. EXAM: PORTABLE CHEST 1 VIEW COMPARISON:  Chest  radiograph dated 05/07/2015 FINDINGS: Single-view of the chest demonstrate emphysematous changes of the lungs with hyperinflation and flattening of the diaphragm. Bilateral linear and discoid atelectasis/scarring. No focal consolidation, pleural effusion, or pneumothorax. The cardiac silhouette is within normal limits. No acute osseous pathology. IMPRESSION: No active disease. Electronically Signed   By: Anner Crete M.D.   On: 10/31/2015 03:44     ASSESSMENT AND PLAN:   55 year old female with past medical history of COPD, seizure breast cancer who presented to the hospital due to shortness of breath and noted to be in acute on chronic respiratory failure with hypercapnia.   1. Acute on chronic respiratory failure with hypercapnia-secondary to COPD exacerbation. -Continue prednisone taper, scheduled DuoNeb's, Dulera, Spiriva -Chest x-ray negative admission but continue empiric Levaquin. Slow to improve. -On chronic O2 at home at 2 L is cannula.  2. COPD exacerbation-continue treatment as mentioned above.  3. Anxiety-continue Xanax.  Likely d/c home tomorrow.    All the records are reviewed and case discussed with Care Management/Social Worker. Management plans discussed with the patient, family and they are in agreement.  CODE STATUS: Full Code  DVT Prophylaxis: Lovenox  TOTAL TIME TAKING CARE OF THIS PATIENT: 30 minutes.   POSSIBLE D/C IN 1-2 DAYS, DEPENDING ON CLINICAL CONDITION.   Henreitta Leber M.D on 11/01/2015 at 2:15 PM  Between 7am to 6pm - Pager - [phone]  After 6pm go to [email or web address] - Proofreader  [facility]  Office  [phone]  CC: Primary care physician; Letta Median, MD

## 2015-11-02 MED ORDER — PREDNISONE 10 MG PO TABS: ORAL_TABLET | ORAL | 0 refills | Status: DC

## 2015-11-02 MED ORDER — LEVOFLOXACIN 500 MG PO TABS: 500.0000 mg | ORAL_TABLET | Freq: Every day | ORAL | 0 refills | Status: DC

## 2015-11-02 NOTE — Discharge Summary (Signed)
[facility] NAME: [name] [name]    MR#:  [id number]  DATE OF BIRTH:  Jul 27, 1960  DATE OF ADMISSION:  10/31/2015 ADMITTING PHYSICIAN: Harrie Foreman, MD  DATE OF DISCHARGE: 11/02/2015  PRIMARY CARE PHYSICIAN: Letta Median, MD    ADMISSION DIAGNOSIS:  COPD exacerbation ([place]) [J44.1]  DISCHARGE DIAGNOSIS:  Active Problems:   Respiratory failure with hypercapnia ([place])   SECONDARY DIAGNOSIS:   Past Medical History:  Diagnosis Date  . Breast cancer ([place])   . Cancer ([place])   . COPD (chronic obstructive pulmonary disease) [facility])     HOSPITAL COURSE:   55 year old female with past medical history of COPD, seizure breast cancer who presented to the hospital due to shortness of breath and noted to be in acute on chronic respiratory failure with hypercapnia.  1. Acute on chronic respiratory failure with hypercapnia-secondary to COPD exacerbation. -Initially patient was admitted to the hospital started on BiPAP, and also placed on IV steroids, scheduled DuoNeb's and Pulmicort nebs. -She has clinically improved. She is not being discharged on a prednisone taper, and maintenance of her inhalers including Advair and spray you are. -Her chest x-ray was negative but she was given empiric Levaquin which she is being discharged on. She started on chronic oxygen at home at 2 L which she will continue.  2. COPD exacerbation-continue treatment as mentioned above.  3. Anxiety-she will continue Xanax.   DISCHARGE CONDITIONS:   Stable.   CONSULTS OBTAINED:    DRUG ALLERGIES:   Allergies  Allergen Reactions  . Zithromax [Azithromycin] Other (See Comments)    Patient reports that medication makes breathing worse  . Penicillins Swelling, Rash and Other (See Comments)    Pt states that she had tongue swelling.   Has patient had a PCN reaction causing immediate rash, facial/tongue/throat swelling, SOB or lightheadedness  with hypotension: Yes Has patient had a PCN reaction causing severe rash involving mucus membranes or skin necrosis: No Has patient had a PCN reaction that required hospitalization No Has patient had a PCN reaction occurring within the last 10 years: No If all of the above answers are "NO", then may proceed with Cephalosporin use.    DISCHARGE MEDICATIONS:     Medication List    STOP taking these medications   doxycycline 100 MG tablet Commonly known as:  VIBRA-TABS     TAKE these medications   ALPRAZolam 1 MG tablet Commonly known as:  XANAX Take 0.5 tablets (0.5 mg total) by mouth 3 (three) times daily as needed for anxiety.   aspirin 81 MG EC tablet Take 1 tablet (81 mg total) by mouth daily.   cetirizine 10 MG tablet Commonly known as:  ZYRTEC ALLERGY Take 1 tablet (10 mg total) by mouth daily.   DULERA 200-5 MCG/ACT Aero Generic drug:  mometasone-formoterol INHALE 2 PUFFS BY MOUTH TWICE DAILY   fluticasone 50 MCG/ACT nasal spray Commonly known as:  FLONASE Place 1 spray into both nostrils daily.   guaiFENesin 600 MG 12 hr tablet Commonly known as:  MUCINEX Take 1 tablet (600 mg total) by mouth 2 (two) times daily as needed for cough or to loosen phlegm.   ipratropium-albuterol 0.5-2.5 (3) MG/3ML Soln Commonly known as:  DUONEB Take 3 mLs by nebulization every 4 (four) hours as needed (for wheezing/shortness of breath).   levofloxacin 500 MG tablet Commonly known as:  LEVAQUIN Take 1 tablet (500 mg total) by mouth daily. Start taking on:  11/03/2015  OXYGEN Inhale 2 L/min into the lungs at bedtime.   predniSONE 10 MG tablet Commonly known as:  DELTASONE Label  & dispense according to the schedule below. 5 Pills PO for 1 day then, 4 Pills PO for 1 day, 3 Pills PO for 1 day, 2 Pills PO for 1 day, 1 Pill PO for 1 days then STOP. What changed:  medication strength  additional instructions   PROAIR HFA 108 (90 Base) MCG/ACT inhaler Generic drug:   albuterol INHALE 2 PUFFS BY MOUTH INTO THE LUNGS EVERY 6 HOURS AS NEEDED FOR WHEEZING OR SHORTNESS OF BREATH   QUEtiapine 50 MG tablet Commonly known as:  SEROQUEL Take 1 tablet (50 mg total) by mouth at bedtime.   sertraline 25 MG tablet Commonly known as:  ZOLOFT Take 25 mg by mouth daily.   tiotropium 18 MCG inhalation capsule Commonly known as:  SPIRIVA Place 1 capsule (18 mcg total) into inhaler and inhale daily.         DISCHARGE INSTRUCTIONS:   DIET:  Regular diet  DISCHARGE CONDITION:  Stable  ACTIVITY:  Activity as tolerated  OXYGEN:  Home Oxygen: Yes.     Oxygen Delivery: 2 liters/min via Patient connected to nasal cannula oxygen  DISCHARGE LOCATION:  home   If you experience worsening of your admission symptoms, develop shortness of breath, life threatening emergency, suicidal or homicidal thoughts you must seek medical attention immediately by calling 911 or calling your MD immediately  if symptoms less severe.  You Must read complete instructions/literature along with all the possible adverse reactions/side effects for all the Medicines you take and that have been prescribed to you. Take any new Medicines after you have completely understood and accpet all the possible adverse reactions/side effects.   Please note  You were cared for by a hospitalist during your hospital stay. If you have any questions about your discharge medications or the care you received while you were in the hospital after you are discharged, you can call the unit and asked to speak with the hospitalist on call if the hospitalist that took care of you is not available. Once you are discharged, your primary care physician will handle any further medical issues. Please note that NO REFILLS for any discharge medications will be authorized once you are discharged, as it is imperative that you return to your primary care physician (or establish a relationship with a primary care physician if  you do not have one) for your aftercare needs so that they can reassess your need for medications and monitor your lab values.     Today   Shortness of breath much improved and feels much better. D/c home today.   VITAL SIGNS:  Blood pressure (!) 113/93, pulse 77, temperature 98.6 F (37 C), temperature source Oral, resp. rate 17, height 5\' 2"  (1.575 m), weight 59.4 kg (130 lb 14.4 oz), SpO2 97 %.  I/O:   Intake/Output Summary (Last 24 hours) at 11/02/15 1520 Last data filed at 11/02/15 0943  Gross per 24 hour  Intake              580 ml  Output                0 ml  Net              580 ml    PHYSICAL EXAMINATION:  GENERAL:  55 y.o.-year-old patient lying in the bed with no acute distress.  EYES: Pupils equal, round, reactive to  light and accommodation. No scleral icterus. Extraocular muscles intact.  HEENT: Head atraumatic, normocephalic. Oropharynx and nasopharynx clear.  NECK:  Supple, no jugular venous distention. No thyroid enlargement, no tenderness.  LUNGS: Normal breath sounds bilaterally, no wheezing, rales,rhonchi. No use of accessory muscles of respiration.  CARDIOVASCULAR: S1, S2 normal. No murmurs, rubs, or gallops.  ABDOMEN: Soft, non-tender, non-distended. Bowel sounds present. No organomegaly or mass.  EXTREMITIES: No pedal edema, cyanosis, or clubbing.  NEUROLOGIC: Cranial nerves II through XII are intact. No focal motor or sensory defecits b/l.  PSYCHIATRIC: The patient is alert and oriented x 3. Good affect.  SKIN: No obvious rash, lesion, or ulcer.   DATA REVIEW:   CBC  Recent Labs Lab 10/31/15 0154  WBC 10.2  HGB 14.1  HCT 41.7  PLT 238    Chemistries   Recent Labs Lab 10/31/15 0154  NA 137  K 4.0  CL 98*  CO2 32  GLUCOSE 88  BUN 10  CREATININE 0.64  CALCIUM 8.9    Cardiac Enzymes  Recent Labs Lab 10/31/15 0154  TROPONINI <0.03    Microbiology Results  Results for orders placed or performed during the hospital encounter of  10/31/15  MRSA PCR Screening     Status: None   Collection Time: 10/31/15  9:00 AM  Result Value Ref Range Status   MRSA by PCR NEGATIVE NEGATIVE Final    Comment:        The GeneXpert MRSA Assay (FDA approved for NASAL specimens only), is one component of a comprehensive MRSA colonization surveillance program. It is not intended to diagnose MRSA infection nor to guide or monitor treatment for MRSA infections.     RADIOLOGY:  No results found.    Management plans discussed with the patient, family and they are in agreement.  CODE STATUS:     Code Status Orders        Start     Ordered   10/31/15 0734  Full code  Continuous     10/31/15 0733    Code Status History    Date Active Date Inactive Code Status Order ID Comments User Context   05/06/2015  5:11 PM 05/14/2015  5:49 PM Full Code [id number]  Wilhelmina Mcardle, MD ED   12/08/2014  9:33 PM 12/10/2014  3:11 PM Full Code [id number]  Theodoro Grist, MD Inpatient   07/15/2014  9:24 PM 07/17/2014  2:06 PM Full Code [id number]  Idelle Crouch, MD Inpatient      TOTAL TIME TAKING CARE OF THIS PATIENT: 40 minutes.    Henreitta Leber M.D on 11/02/2015 at 3:20 PM  Between 7am to 6pm - Pager - [phone]  After 6pm go to [email or web address] - Proofreader  [facility]  Office  [phone]  CC: Primary care physician; Letta Median, MD

## 2015-11-02 NOTE — Care Management (Signed)
Patient admitted with Respiratory distress.  Patient lives at home with family.  PCP [place]. Pharmacy [facility].  Patient to discharge home today.  Patient has home O2, family to bring her portable tank for discharge today.  No RNCM needs identified.

## 2015-11-02 NOTE — Progress Notes (Signed)
11/02/2015 1:39 PM  BP (!) 113/93 (BP Location: Left Arm)   Pulse 77   Temp 98.6 F (37 C) (Oral)   Resp 17   Ht 5\' 2"  (1.575 m)   Wt 59.4 kg (130 lb 14.4 oz)   SpO2 97%   BMI 23.94 kg/m  Patient discharged per MD orders. Discharge instructions reviewed with patient and patient verbalized understanding. IV removed per policy. Prescriptions discussed and given to patient. Discharged via wheelchair escorted by auxilary.  Almedia Balls, RN

## 2015-11-11 DIAGNOSIS — F4310 Post-traumatic stress disorder, unspecified: Secondary | ICD-10-CM | POA: Insufficient documentation

## 2015-11-11 DIAGNOSIS — F341 Dysthymic disorder: Secondary | ICD-10-CM | POA: Insufficient documentation

## 2015-11-13 DIAGNOSIS — B029 Zoster without complications: Secondary | ICD-10-CM | POA: Insufficient documentation

## 2015-11-13 DIAGNOSIS — Z853 Personal history of malignant neoplasm of breast: Secondary | ICD-10-CM | POA: Insufficient documentation

## 2015-11-28 ENCOUNTER — Encounter: Payer: Self-pay | Admitting: Emergency Medicine

## 2015-11-28 ENCOUNTER — Emergency Department
Admission: EM | Admit: 2015-11-28 | Discharge: 2015-11-28 | Disposition: A | Discharge status: Home / Self Care | Payer: Medicare Other | Attending: Emergency Medicine | Admitting: Emergency Medicine

## 2015-11-28 ENCOUNTER — Emergency Department: Payer: Medicare Other

## 2015-11-28 DIAGNOSIS — Z7982 Long term (current) use of aspirin: Secondary | ICD-10-CM | POA: Diagnosis not present

## 2015-11-28 DIAGNOSIS — Z853 Personal history of malignant neoplasm of breast: Secondary | ICD-10-CM | POA: Diagnosis not present

## 2015-11-28 DIAGNOSIS — R0602 Shortness of breath: Secondary | ICD-10-CM | POA: Insufficient documentation

## 2015-11-28 DIAGNOSIS — Z79899 Other long term (current) drug therapy: Secondary | ICD-10-CM | POA: Insufficient documentation

## 2015-11-28 DIAGNOSIS — Z87891 Personal history of nicotine dependence: Secondary | ICD-10-CM | POA: Diagnosis not present

## 2015-11-28 DIAGNOSIS — R079 Chest pain, unspecified: Secondary | ICD-10-CM | POA: Insufficient documentation

## 2015-11-28 DIAGNOSIS — J441 Chronic obstructive pulmonary disease with (acute) exacerbation: Secondary | ICD-10-CM | POA: Insufficient documentation

## 2015-11-28 LAB — BASIC METABOLIC PANEL
Anion gap: 8 (ref 5–15)
BUN: 13 mg/dL (ref 6–20)
CHLORIDE: 106 mmol/L (ref 101–111)
CO2: 25 mmol/L (ref 22–32)
Calcium: 8.6 mg/dL — ABNORMAL LOW (ref 8.9–10.3)
Creatinine, Ser: 0.56 mg/dL (ref 0.44–1.00)
GFR calc non Af Amer: >60 mL/min (ref >60)
Glucose, Bld: 83 mg/dL (ref 65–99)
POTASSIUM: 3.5 mmol/L (ref 3.5–5.1)
SODIUM: 139 mmol/L (ref 135–145)

## 2015-11-28 LAB — CBC
HEMATOCRIT: 36.7 % (ref 35.0–47.0)
Hemoglobin: 12.3 g/dL (ref 12.0–16.0)
MCH: 29.2 pg (ref 26.0–34.0)
MCHC: 33.6 g/dL (ref 32.0–36.0)
MCV: 86.8 fL (ref 80.0–100.0)
Platelets: 254 10*3/uL (ref 150–440)
RBC: 4.22 MIL/uL (ref 3.80–5.20)
RDW: 13.2 % (ref 11.5–14.5)
WBC: 6.9 10*3/uL (ref 3.6–11.0)

## 2015-11-28 LAB — TROPONIN I: Troponin I: <0.03 ng/mL (ref <0.03)

## 2015-11-28 LAB — FIBRIN DERIVATIVES D-DIMER (ARMC ONLY): Fibrin derivatives D-dimer (ARMC): 230 (ref 0–499)

## 2015-11-28 MED ORDER — LORAZEPAM 2 MG/ML IJ SOLN
1.0000 mg | Freq: Once | INTRAMUSCULAR | Status: AC
  Administered 2015-11-28: 1 mg via INTRAVENOUS
  Filled 2015-11-28: qty 1

## 2015-11-28 MED ORDER — IPRATROPIUM-ALBUTEROL 0.5-2.5 (3) MG/3ML IN SOLN
3.0000 mL | Freq: Once | RESPIRATORY_TRACT | Status: AC
  Administered 2015-11-28: 3 mL via RESPIRATORY_TRACT
  Filled 2015-11-28: qty 3

## 2015-11-28 NOTE — ED Triage Notes (Signed)
Patient c/o centralized chest pain starting in the middle of the night. SHOB X 3 days.  Quit smoking 15 days ago. Chest pain radiates to left arm.  Nauseous.  No vomiting. Skin is moist to touch.  Recently had shingles - finished 12 days of medication.

## 2015-11-28 NOTE — ED Notes (Signed)
Patient placed in subwaiting area.  No change in chest pain.  EKG has been done and read.  Charge Nurse aware patient in [place].

## 2015-11-28 NOTE — ED Notes (Signed)
States she developed some pain to left side of chest last pm  And then this am developed some discomfort to right side of chest  Feels like can't get a breath  No fever resp non labored at present.  But feels like she has a "lung infection"

## 2015-11-28 NOTE — ED Provider Notes (Signed)
[facility] Emergency Department Provider Note        Time seen: ----------------------------------------- 11:26 AM on 11/28/2015 -----------------------------------------    I have reviewed the triage vital signs and the nursing notes.   HISTORY  Chief Complaint Chest Pain    HPI [name] [name] is a 55 y.o. female who presents to the ER for central last chest pain that started in the middle the night. Patient describes shortness of breath 3 days that she states does not feel like her COPD. Chest pain had radiated to her left forearm, she denies nausea or vomiting. She does feel anxious. She recently had shingles and finished 12 days of antiviral medicine.   Past Medical History:  Diagnosis Date  . Breast cancer ([place])   . Cancer ([place])   . COPD (chronic obstructive pulmonary disease) [facility])     Patient Active Problem List   Diagnosis Date Noted  . Respiratory failure with hypercapnia ([place]) 10/31/2015  . Anxiety 05/06/2015  . Smoker 05/06/2015  . Emphysema lung ([place]) 05/06/2015  . Acute on chronic respiratory failure ([place]) 07/15/2014  . COPD exacerbation ([place]) 07/15/2014  . Acute bronchitis 07/15/2014  . Tachycardia 07/15/2014    Past Surgical History:  Procedure Laterality Date  . ABDOMINAL HYSTERECTOMY      Allergies Zithromax [azithromycin] and Penicillins  Social History Social History  Substance Use Topics  . Smoking status: Former Smoker    Packs/day: 0.50    Years: 40.00  . Smokeless tobacco: Never Used     Comment: quit smoking 09/26/15  . Alcohol use No    Review of Systems Constitutional: Negative for fever. Cardiovascular: Positive for chest pain Respiratory: Positive shortness of breath Gastrointestinal: Negative for abdominal pain, vomiting and diarrhea. Genitourinary: Negative for dysuria. Musculoskeletal: Negative for back pain. Skin: Negative for rash. Neurological: Negative for headaches, focal weakness or  numbness.  10-point ROS otherwise negative.  ____________________________________________   PHYSICAL EXAM:  VITAL SIGNS: ED Triage Vitals  Enc Vitals Group     BP 11/28/15 1019 116/76     Pulse Rate 11/28/15 1019 97     Resp 11/28/15 1019 20     Temp 11/28/15 1019 98.1 F (36.7 C)     Temp Source 11/28/15 1019 Oral     SpO2 11/28/15 1019 92 %     Weight 11/28/15 1020 120 lb (54.4 kg)     Height 11/28/15 1020 5\' 2"  (1.575 m)     Head Circumference --      Peak Flow --      Pain Score 11/28/15 1021 5     Pain Loc --      Pain Edu? --      Excl. in [place]? --     Constitutional: Alert and oriented. Anxious, no distress Eyes: Conjunctivae are normal. PERRL. Normal extraocular movements. ENT   Head: Normocephalic and atraumatic.   Nose: No congestion/rhinnorhea.   Mouth/Throat: Mucous membranes are moist.   Neck: No stridor. Cardiovascular: Normal rate, regular rhythm. No murmurs, rubs, or gallops. Respiratory: Normal respiratory effort without tachypnea nor retractions. Diminished breath sounds bilaterally Gastrointestinal: Soft and nontender. Normal bowel sounds Musculoskeletal: Nontender with normal range of motion in all extremities. No lower extremity tenderness nor edema. Neurologic:  Normal speech and language. No gross focal neurologic deficits are appreciated.  Skin:  Skin is warm, dry and intact. No rash noted. Psychiatric: Mood and affect are normal. Speech and behavior are normal.  ____________________________________________  EKG: Interpreted by me. Sinus  rhythm with PVCs, rate is 96 bpm, normal PR interval, normal QRS size, normal QT.  ____________________________________________  ED COURSE:  Pertinent labs & imaging results that were available during my care of the patient were reviewed by me and considered in my medical decision making (see chart for details). Clinical Course  Patient presents to the ER in no distress but with dyspnea and chest  pain. We will assess with cardiac labs and x-rays.  Procedures ____________________________________________   LABS (pertinent positives/negatives)  Labs Reviewed  BASIC METABOLIC PANEL - Abnormal; Notable for the following:       Result Value   Calcium 8.6 (*)    All other components within normal limits  CBC  TROPONIN I  FIBRIN DERIVATIVES D-DIMER ([facility] ONLY)  TROPONIN I    RADIOLOGY Chest x-ray  IMPRESSION: Persistent bilateral mid lung field and bibasilar subsegmental atelectasis and/or scarring. No acute abnormality .   ____________________________________________  FINAL ASSESSMENT AND PLAN  Chest pain  Plan: Patient with labs and imaging as dictated above. Unclear etiology for her chest pain. Serial troponin and d-dimer are negative. She'll be referred to cardiology for close outpatient follow-up.   Earleen Newport, MD   Note: This dictation was prepared with Dragon dictation. Any transcriptional errors that result from this process are unintentional    Earleen Newport, MD 11/28/15 1358

## 2015-12-16 ENCOUNTER — Other Ambulatory Visit: Payer: Self-pay | Admitting: Internal Medicine

## 2015-12-17 ENCOUNTER — Telehealth: Payer: Self-pay | Admitting: Internal Medicine

## 2015-12-17 MED ORDER — MOMETASONE FURO-FORMOTEROL FUM 200-5 MCG/ACT IN AERO: 2.0000 | INHALATION_SPRAY | Freq: Two times a day (BID) | RESPIRATORY_TRACT | 1 refills | Status: DC

## 2015-12-17 MED ORDER — ALBUTEROL SULFATE HFA 108 (90 BASE) MCG/ACT IN AERS: INHALATION_SPRAY | RESPIRATORY_TRACT | 0 refills | Status: DC

## 2015-12-17 NOTE — Telephone Encounter (Signed)
RXs sent to pharmacy. Nothing further needed.

## 2015-12-17 NOTE — Telephone Encounter (Signed)
*  STAT* If patient is at the pharmacy, call can be transferred to refill team.   1. Which medications need to be refilled? (please list name of each medication and dose if knownPROAIR HFA 108 (90 Base) MCG/ACT inhaler and DULERA 200-5 MCG/ACT AERO  2. Which pharmacy/location (including street and city if local pharmacy) is medication to be sent to? [facility] in [place]  3. Do they need a 30 day or 90 day supply? 90 day supply  Completely out of medications.

## 2015-12-17 NOTE — Telephone Encounter (Signed)
Patient called for refill request.  LMOV to see which medication she needs.

## 2016-01-04 ENCOUNTER — Encounter: Payer: Self-pay | Admitting: Emergency Medicine

## 2016-01-04 ENCOUNTER — Emergency Department: Payer: Medicare Other

## 2016-01-04 ENCOUNTER — Inpatient Hospital Stay
Admission: EM | Admit: 2016-01-04 | Discharge: 2016-01-07 | DRG: 190 | Disposition: A | Discharge status: Home / Self Care | Payer: Medicare Other | Attending: Internal Medicine | Admitting: Internal Medicine

## 2016-01-04 DIAGNOSIS — J441 Chronic obstructive pulmonary disease with (acute) exacerbation: Secondary | ICD-10-CM | POA: Diagnosis present

## 2016-01-04 DIAGNOSIS — Z7982 Long term (current) use of aspirin: Secondary | ICD-10-CM

## 2016-01-04 DIAGNOSIS — Z880 Allergy status to penicillin: Secondary | ICD-10-CM | POA: Diagnosis not present

## 2016-01-04 DIAGNOSIS — Z23 Encounter for immunization: Secondary | ICD-10-CM

## 2016-01-04 DIAGNOSIS — Z881 Allergy status to other antibiotic agents status: Secondary | ICD-10-CM

## 2016-01-04 DIAGNOSIS — J209 Acute bronchitis, unspecified: Secondary | ICD-10-CM | POA: Diagnosis present

## 2016-01-04 DIAGNOSIS — Z87891 Personal history of nicotine dependence: Secondary | ICD-10-CM | POA: Diagnosis not present

## 2016-01-04 DIAGNOSIS — Z7951 Long term (current) use of inhaled steroids: Secondary | ICD-10-CM | POA: Diagnosis not present

## 2016-01-04 DIAGNOSIS — Z853 Personal history of malignant neoplasm of breast: Secondary | ICD-10-CM | POA: Diagnosis not present

## 2016-01-04 DIAGNOSIS — J9621 Acute and chronic respiratory failure with hypoxia: Secondary | ICD-10-CM

## 2016-01-04 DIAGNOSIS — Z9981 Dependence on supplemental oxygen: Secondary | ICD-10-CM

## 2016-01-04 DIAGNOSIS — Z79899 Other long term (current) drug therapy: Secondary | ICD-10-CM | POA: Diagnosis not present

## 2016-01-04 DIAGNOSIS — Z803 Family history of malignant neoplasm of breast: Secondary | ICD-10-CM | POA: Diagnosis not present

## 2016-01-04 DIAGNOSIS — R0602 Shortness of breath: Secondary | ICD-10-CM

## 2016-01-04 DIAGNOSIS — J440 Chronic obstructive pulmonary disease with acute lower respiratory infection: Principal | ICD-10-CM | POA: Diagnosis present

## 2016-01-04 DIAGNOSIS — D72829 Elevated white blood cell count, unspecified: Secondary | ICD-10-CM

## 2016-01-04 DIAGNOSIS — J449 Chronic obstructive pulmonary disease, unspecified: Secondary | ICD-10-CM | POA: Diagnosis present

## 2016-01-04 DIAGNOSIS — Z801 Family history of malignant neoplasm of trachea, bronchus and lung: Secondary | ICD-10-CM | POA: Diagnosis not present

## 2016-01-04 LAB — CBC WITH DIFFERENTIAL/PLATELET
BASOS PCT: 1 %
Basophils Absolute: 0.1 10*3/uL (ref 0–0.1)
EOS ABS: 0.2 10*3/uL (ref 0–0.7)
EOS PCT: 1 %
HCT: 38.4 % (ref 35.0–47.0)
Hemoglobin: 12.3 g/dL (ref 12.0–16.0)
LYMPHS ABS: 3.7 10*3/uL — AB (ref 1.0–3.6)
Lymphocytes Relative: 30 %
MCH: 28.6 pg (ref 26.0–34.0)
MCHC: 32.1 g/dL (ref 32.0–36.0)
MCV: 89 fL (ref 80.0–100.0)
MONOS PCT: 7 %
Monocytes Absolute: 0.9 10*3/uL (ref 0.2–0.9)
Neutro Abs: 7.5 10*3/uL — ABNORMAL HIGH (ref 1.4–6.5)
Neutrophils Relative %: 61 %
PLATELETS: 261 10*3/uL (ref 150–440)
RBC: 4.31 MIL/uL (ref 3.80–5.20)
RDW: 12.9 % (ref 11.5–14.5)
WBC: 12.2 10*3/uL — AB (ref 3.6–11.0)

## 2016-01-04 LAB — BASIC METABOLIC PANEL
Anion gap: 8 (ref 5–15)
BUN: 22 mg/dL — AB (ref 6–20)
CALCIUM: 8.8 mg/dL — AB (ref 8.9–10.3)
CO2: 27 mmol/L (ref 22–32)
CREATININE: 0.76 mg/dL (ref 0.44–1.00)
Chloride: 104 mmol/L (ref 101–111)
GFR calc Af Amer: >60 mL/min (ref >60)
GLUCOSE: 97 mg/dL (ref 65–99)
Potassium: 3.9 mmol/L (ref 3.5–5.1)
SODIUM: 139 mmol/L (ref 135–145)

## 2016-01-04 LAB — TROPONIN I

## 2016-01-04 LAB — INFLUENZA PANEL BY PCR (TYPE A & B)
INFLAPCR: NEGATIVE
INFLBPCR: NEGATIVE

## 2016-01-04 LAB — FIBRIN DERIVATIVES D-DIMER (ARMC ONLY): FIBRIN DERIVATIVES D-DIMER (ARMC): 142 (ref 0–499)

## 2016-01-04 LAB — MAGNESIUM: Magnesium: 1.8 mg/dL (ref 1.7–2.4)

## 2016-01-04 MED ORDER — IPRATROPIUM-ALBUTEROL 0.5-2.5 (3) MG/3ML IN SOLN
3.0000 mL | Freq: Once | RESPIRATORY_TRACT | Status: AC
  Administered 2016-01-04: 3 mL via RESPIRATORY_TRACT
  Filled 2016-01-04: qty 3

## 2016-01-04 MED ORDER — TIOTROPIUM BROMIDE MONOHYDRATE 18 MCG IN CAPS
18.0000 ug | ORAL_CAPSULE | Freq: Every day | RESPIRATORY_TRACT | Status: DC
  Administered 2016-01-05 – 2016-01-07 (×3): 18 ug via RESPIRATORY_TRACT
  Filled 2016-01-04: qty 5

## 2016-01-04 MED ORDER — ACETAMINOPHEN 650 MG RE SUPP: 650.0000 mg | Freq: Four times a day (QID) | RECTAL | Status: DC | PRN

## 2016-01-04 MED ORDER — METHYLPREDNISOLONE SODIUM SUCC 125 MG IJ SOLR
125.0000 mg | Freq: Once | INTRAMUSCULAR | Status: AC
  Administered 2016-01-04: 125 mg via INTRAVENOUS
  Filled 2016-01-04: qty 2

## 2016-01-04 MED ORDER — CEFTRIAXONE SODIUM-DEXTROSE 1-3.74 GM-% IV SOLR
1.0000 g | INTRAVENOUS | Status: DC
  Administered 2016-01-05 – 2016-01-06 (×2): 1 g via INTRAVENOUS
  Filled 2016-01-04 (×2): qty 50

## 2016-01-04 MED ORDER — QUETIAPINE FUMARATE 25 MG PO TABS
50.0000 mg | ORAL_TABLET | Freq: Every day | ORAL | Status: DC
Start: 2016-01-04 — End: 2016-01-07
  Administered 2016-01-04 – 2016-01-06 (×3): 50 mg via ORAL
  Filled 2016-01-04 (×3): qty 2

## 2016-01-04 MED ORDER — GUAIFENESIN ER 600 MG PO TB12: 600.0000 mg | ORAL_TABLET | Freq: Two times a day (BID) | ORAL | Status: DC | PRN

## 2016-01-04 MED ORDER — LORATADINE 10 MG PO TABS
10.0000 mg | ORAL_TABLET | Freq: Every day | ORAL | Status: DC
Start: 2016-01-05 — End: 2016-01-07
  Administered 2016-01-06 – 2016-01-07 (×2): 10 mg via ORAL
  Filled 2016-01-04 (×3): qty 1

## 2016-01-04 MED ORDER — NICOTINE 21 MG/24HR TD PT24: 21.0000 mg | MEDICATED_PATCH | Freq: Every day | TRANSDERMAL | Status: DC | PRN

## 2016-01-04 MED ORDER — ASPIRIN EC 81 MG PO TBEC
81.0000 mg | DELAYED_RELEASE_TABLET | Freq: Every day | ORAL | Status: DC
  Administered 2016-01-05 – 2016-01-07 (×3): 81 mg via ORAL
  Filled 2016-01-04 (×3): qty 1

## 2016-01-04 MED ORDER — ONDANSETRON HCL 4 MG PO TABS: 4.0000 mg | ORAL_TABLET | Freq: Four times a day (QID) | ORAL | Status: DC | PRN

## 2016-01-04 MED ORDER — ONDANSETRON HCL 4 MG/2ML IJ SOLN: 4.0000 mg | Freq: Four times a day (QID) | INTRAMUSCULAR | Status: DC | PRN

## 2016-01-04 MED ORDER — ENOXAPARIN SODIUM 40 MG/0.4ML ~~LOC~~ SOLN
40.0000 mg | SUBCUTANEOUS | Status: DC
  Administered 2016-01-04 – 2016-01-06 (×3): 40 mg via SUBCUTANEOUS
  Filled 2016-01-04 (×3): qty 0.4

## 2016-01-04 MED ORDER — DOCUSATE SODIUM 100 MG PO CAPS
100.0000 mg | ORAL_CAPSULE | Freq: Two times a day (BID) | ORAL | Status: DC
  Administered 2016-01-04 – 2016-01-07 (×6): 100 mg via ORAL
  Filled 2016-01-04 (×6): qty 1

## 2016-01-04 MED ORDER — ALPRAZOLAM 0.5 MG PO TABS
0.5000 mg | ORAL_TABLET | Freq: Three times a day (TID) | ORAL | Status: DC | PRN
  Administered 2016-01-06: 0.5 mg via ORAL
  Filled 2016-01-04 (×2): qty 1

## 2016-01-04 MED ORDER — DOXYCYCLINE HYCLATE 100 MG PO TABS
100.0000 mg | ORAL_TABLET | Freq: Once | ORAL | Status: AC
  Administered 2016-01-04: 100 mg via ORAL
  Filled 2016-01-04: qty 1

## 2016-01-04 MED ORDER — ACETAMINOPHEN 325 MG PO TABS: 650.0000 mg | ORAL_TABLET | Freq: Four times a day (QID) | ORAL | Status: DC | PRN

## 2016-01-04 MED ORDER — METHYLPREDNISOLONE SODIUM SUCC 125 MG IJ SOLR
60.0000 mg | Freq: Four times a day (QID) | INTRAMUSCULAR | Status: DC
  Administered 2016-01-05 (×2): 60 mg via INTRAVENOUS
  Filled 2016-01-04 (×2): qty 2

## 2016-01-04 MED ORDER — IPRATROPIUM-ALBUTEROL 0.5-2.5 (3) MG/3ML IN SOLN
RESPIRATORY_TRACT | Status: AC
  Administered 2016-01-04: 3 mL
  Filled 2016-01-04: qty 3

## 2016-01-04 MED ORDER — CEFTRIAXONE SODIUM-DEXTROSE 1-3.74 GM-% IV SOLR
1.0000 g | Freq: Once | INTRAVENOUS | Status: AC
  Administered 2016-01-04: 1 g via INTRAVENOUS
  Filled 2016-01-04: qty 50

## 2016-01-04 MED ORDER — DEXTROSE 5 % IV SOLN: 1.0000 g | Freq: Once | INTRAVENOUS | Status: DC

## 2016-01-04 MED ORDER — MOMETASONE FURO-FORMOTEROL FUM 200-5 MCG/ACT IN AERO
2.0000 | INHALATION_SPRAY | Freq: Two times a day (BID) | RESPIRATORY_TRACT | Status: DC
  Administered 2016-01-04 – 2016-01-07 (×6): 2 via RESPIRATORY_TRACT
  Filled 2016-01-04: qty 8.8

## 2016-01-04 MED ORDER — DEXTROSE 5 % IV SOLN: 1.0000 g | INTRAVENOUS | Status: DC

## 2016-01-04 MED ORDER — ALBUTEROL SULFATE (2.5 MG/3ML) 0.083% IN NEBU
INHALATION_SOLUTION | RESPIRATORY_TRACT | Status: AC
  Administered 2016-01-04: 5 mg via RESPIRATORY_TRACT
  Filled 2016-01-04: qty 6

## 2016-01-04 MED ORDER — FLUTICASONE PROPIONATE 50 MCG/ACT NA SUSP
1.0000 | Freq: Every day | NASAL | Status: DC
  Administered 2016-01-05 – 2016-01-07 (×3): 1 via NASAL
  Filled 2016-01-04: qty 16

## 2016-01-04 MED ORDER — IPRATROPIUM-ALBUTEROL 0.5-2.5 (3) MG/3ML IN SOLN
3.0000 mL | RESPIRATORY_TRACT | Status: DC
  Administered 2016-01-05 – 2016-01-06 (×9): 3 mL via RESPIRATORY_TRACT
  Filled 2016-01-04 (×9): qty 3

## 2016-01-04 MED ORDER — BUDESONIDE 0.25 MG/2ML IN SUSP
0.2500 mg | Freq: Two times a day (BID) | RESPIRATORY_TRACT | Status: DC
  Administered 2016-01-04: 0.25 mg via RESPIRATORY_TRACT
  Filled 2016-01-04: qty 2

## 2016-01-04 MED ORDER — ALBUTEROL SULFATE (2.5 MG/3ML) 0.083% IN NEBU
5.0000 mg | INHALATION_SOLUTION | Freq: Once | RESPIRATORY_TRACT | Status: AC
  Administered 2016-01-04: 5 mg via RESPIRATORY_TRACT

## 2016-01-04 MED ORDER — DOXYCYCLINE HYCLATE 100 MG PO TABS
100.0000 mg | ORAL_TABLET | Freq: Two times a day (BID) | ORAL | Status: DC
  Administered 2016-01-05 – 2016-01-07 (×5): 100 mg via ORAL
  Filled 2016-01-04 (×5): qty 1

## 2016-01-04 MED ORDER — SODIUM CHLORIDE 0.9% FLUSH
3.0000 mL | Freq: Two times a day (BID) | INTRAVENOUS | Status: DC
  Administered 2016-01-04 – 2016-01-07 (×7): 3 mL via INTRAVENOUS

## 2016-01-04 NOTE — ED Triage Notes (Signed)
Patient presents to the ED with shortness of breath that patient states started around Thanksgiving but has gotten much worse today.  Patient reports been seen by Urgent Care and told she had a "little bit of pneumonia" and she has been antibiotics without relief since the, approx. 1 week ago.  Patient is using shoulder to breathe and having some difficulty speaking in full sentences.  Patient reports using three breathing treatments at home and using her inhaler.  Patient denies chest pain at this time.

## 2016-01-04 NOTE — ED Notes (Signed)
Patient transported to Ultrasound 

## 2016-01-04 NOTE — ED Notes (Signed)
Attempted report x1, they are still giving report.  Will try back in 10 minutes.

## 2016-01-04 NOTE — H&P (Addendum)
[facility] NAME: [name] [name]    MR#:  [id number]  DATE OF BIRTH:  [birth year]  DATE OF ADMISSION:  01/04/2016  PRIMARY CARE PHYSICIAN: Letta Median, MD   REQUESTING/REFERRING PHYSICIAN:   CHIEF COMPLAINT:  No chief complaint on file.   HISTORY OF PRESENT ILLNESS: [name] [name]  is a 55 y.o. female with a known history of breast cancer, COPD, 35 year history of tobacco abuse, quit approximately a month ago, who presents to the hospital with complaints of about week history of significant shortness of breath, cough, rattling in the chest. Patient was seen in urgent care about a week ago where she was given cefdinir and steroids. She felt that she improved but then over the past few days. She is seemed to be getting worse. Her saturation was 88% on room air, she's been coughing mostly dry cough, rattling in the chest, having some chest tightness and pain, having chills and feeling very fatigued and weak, having palpitations in the chest and feeling presyncopal. She is very short of breath. She came to the hospital for further evaluation and treatment and hospice services were contacted for admission.   PAST MEDICAL HISTORY:   Past Medical History:  Diagnosis Date  . Breast cancer ([place])   . Cancer ([place])   . COPD (chronic obstructive pulmonary disease) ([place])     PAST SURGICAL HISTORY: Past Surgical History:  Procedure Laterality Date  . ABDOMINAL HYSTERECTOMY      SOCIAL HISTORY:  Social History  Substance Use Topics  . Smoking status: Former Smoker    Packs/day: 0.50    Years: 40.00  . Smokeless tobacco: Never Used     Comment: quit smoking 09/26/15  . Alcohol use No    FAMILY HISTORY:  Family History  Problem Relation Age of Onset  . Breast cancer Mother   . Lung cancer Mother     DRUG ALLERGIES:  Allergies  Allergen Reactions  . Zithromax [Azithromycin] Other (See Comments)    Patient  reports that medication makes breathing worse  . Penicillins Swelling, Rash and Other (See Comments)    Pt states that she had tongue swelling.   Has patient had a PCN reaction causing immediate rash, facial/tongue/throat swelling, SOB or lightheadedness with hypotension: Yes Has patient had a PCN reaction causing severe rash involving mucus membranes or skin necrosis: No Has patient had a PCN reaction that required hospitalization No Has patient had a PCN reaction occurring within the last 10 years: No If all of the above answers are "NO", then may proceed with Cephalosporin use.    Review of Systems  Constitutional: Positive for chills and malaise/fatigue. Negative for fever and weight loss.  HENT: Positive for congestion.   Eyes: Negative for blurred vision and double vision.  Respiratory: Positive for cough and shortness of breath. Negative for sputum production and wheezing.   Cardiovascular: Positive for chest pain, palpitations and leg swelling. Negative for orthopnea and PND.  Gastrointestinal: Negative for abdominal pain, blood in stool, constipation, diarrhea, nausea and vomiting.  Genitourinary: Positive for frequency. Negative for dysuria, hematuria and urgency.  Musculoskeletal: Negative for falls.  Neurological: Negative for dizziness, tremors, focal weakness and headaches.  Endo/Heme/Allergies: Does not bruise/bleed easily.  Psychiatric/Behavioral: Negative for depression. The patient does not have insomnia.     MEDICATIONS AT HOME:  Prior to Admission medications   Medication Sig Start Date End Date Taking? Authorizing Provider  albuterol [facility] HFA)  108 (90 Base) MCG/ACT inhaler INHALE 2 PUFFS BY MOUTH INTO THE LUNGS EVERY 6 HOURS AS NEEDED FOR WHEEZING OR SHORTNESS OF BREATH Patient taking differently: Inhale 2 puffs into the lungs every 6 (six) hours as needed for wheezing or shortness of breath.  12/17/15  Yes Flora Lipps, MD  ALPRAZolam Duanne Moron) 1 MG tablet Take 0.5  tablets (0.5 mg total) by mouth 3 (three) times daily as needed for anxiety. 10/01/15  Yes Flora Lipps, MD  aspirin 81 MG EC tablet Take 1 tablet (81 mg total) by mouth daily. 05/14/15  Yes Demetrios Loll, MD  cefdinir (OMNICEF) 300 MG capsule Take 300 mg by mouth 2 (two) times daily. For 10 days 12/27/15 01/06/16 Yes Historical Provider, MD  cetirizine (ZYRTEC ALLERGY) 10 MG tablet Take 1 tablet (10 mg total) by mouth daily. 08/09/15  Yes Flora Lipps, MD  fluticasone (FLONASE) 50 MCG/ACT nasal spray Place 1 spray into both nostrils daily. 05/31/15 05/30/16 Yes Flora Lipps, MD  guaiFENesin (MUCINEX) 600 MG 12 hr tablet Take 1 tablet (600 mg total) by mouth 2 (two) times daily as needed for cough or to loosen phlegm. 05/14/15  Yes Demetrios Loll, MD  ipratropium-albuterol (DUONEB) 0.5-2.5 (3) MG/3ML SOLN Take 3 mLs by nebulization every 4 (four) hours as needed (for wheezing/shortness of breath). Patient taking differently: Take 3 mLs by nebulization every 4 (four) hours as needed. For wheezing/shortness of breath 07/13/15  Yes Flora Lipps, MD  mometasone-formoterol (DULERA) 200-5 MCG/ACT AERO Inhale 2 puffs into the lungs 2 (two) times daily. 12/17/15  Yes Flora Lipps, MD  tiotropium (SPIRIVA) 18 MCG inhalation capsule Place 1 capsule (18 mcg total) into inhaler and inhale daily. 10/01/15  Yes Flora Lipps, MD  levofloxacin (LEVAQUIN) 500 MG tablet Take 1 tablet (500 mg total) by mouth daily. Patient not taking: Reported on 01/04/2016 11/03/15   Henreitta Leber, MD  QUEtiapine (SEROQUEL) 50 MG tablet Take 1 tablet (50 mg total) by mouth at bedtime. Patient not taking: Reported on 01/04/2016 05/14/15   Demetrios Loll, MD      PHYSICAL EXAMINATION:   VITAL SIGNS: Blood pressure 107/64, pulse 97, temperature 97.9 F (36.6 C), temperature source Oral, resp. rate (!) 25, height 5\' 2"  (1.575 m), weight 65.8 kg (145 lb), SpO2 99 %.  GENERAL:  55 y.o.-year-old patient lying in the bed in moderate respiratory distress, tachypnea,  uncomfortable, struggling.  EYES: Pupils equal, round, reactive to light and accommodation. No scleral icterus. Extraocular muscles intact.  HEENT: Head atraumatic, normocephalic. Oropharynx and nasopharynx clear.  NECK:  Supple, no jugular venous distention. No thyroid enlargement, no tenderness.  LUNGS: Markedly diminished breath sounds bilaterally, no wheezing, rales,rhonchi or crepitation. Using  accessory muscles of respiration, tachypneic.  CARDIOVASCULAR: S1, S2 , tachycardic, intermittently irregular. No murmurs, rubs, or gallops.  ABDOMEN: Soft, nontender, nondistended. Bowel sounds present. No organomegaly or mass.  EXTREMITIES: Trace lower extremity and pedal edema, no cyanosis, or clubbing.  NEUROLOGIC: Cranial nerves II through XII are intact. Muscle strength 5/5 in all extremities. Sensation intact. Gait not checked.  PSYCHIATRIC: The patient is alert and oriented x 3.  SKIN: No obvious rash, lesion, or ulcer.   LABORATORY PANEL:   CBC  Recent Labs Lab 01/04/16 1516  WBC 12.2*  HGB 12.3  HCT 38.4  PLT 261  MCV 89.0  MCH 28.6  MCHC 32.1  RDW 12.9  LYMPHSABS 3.7*  MONOABS 0.9  EOSABS 0.2  BASOSABS 0.1   ------------------------------------------------------------------------------------------------------------------  Chemistries   Recent Labs Lab  01/04/16 1516  NA 139  K 3.9  CL 104  CO2 27  GLUCOSE 97  BUN 22*  CREATININE 0.76  CALCIUM 8.8*  MG 1.8   ------------------------------------------------------------------------------------------------------------------  Cardiac Enzymes  Recent Labs Lab 01/04/16 1516  TROPONINI <0.03   ------------------------------------------------------------------------------------------------------------------  RADIOLOGY: Dg Chest 2 View  Result Date: 01/04/2016 CLINICAL DATA:  Shortness of Breath several days EXAM: CHEST  2 VIEW COMPARISON:  11/28/2015 FINDINGS: Areas of linear scarring are noted bilaterally  stable from the prior exam. The lungs are hyperinflated consistent with COPD. No focal infiltrate or sizable effusion is seen. No bony abnormality is noted. Cardiac shadow is stable. IMPRESSION: COPD with bilateral chronic scarring. Electronically Signed   By: Inez Catalina M.D.   On: 01/04/2016 14:33   US Venous Img Lower Bilateral  Result Date: 01/04/2016 CLINICAL DATA:  Lower extremity pain and edema for 1 month. EXAM: BILATERAL LOWER EXTREMITY VENOUS DOPPLER ULTRASOUND TECHNIQUE: Gray-scale sonography with graded compression, as well as color Doppler and duplex ultrasound were performed to evaluate the lower extremity deep venous systems from the level of the common femoral vein and including the common femoral, femoral, profunda femoral, popliteal and calf veins including the posterior tibial, peroneal and gastrocnemius veins when visible. The superficial great saphenous vein was also interrogated. Spectral Doppler was utilized to evaluate flow at rest and with distal augmentation maneuvers in the common femoral, femoral and popliteal veins. COMPARISON:  None. FINDINGS: RIGHT LOWER EXTREMITY Common Femoral Vein: No evidence of thrombus. Normal compressibility, respiratory phasicity and response to augmentation. Saphenofemoral Junction: No evidence of thrombus. Normal compressibility and flow on color Doppler imaging. Profunda Femoral Vein: No evidence of thrombus. Normal compressibility and flow on color Doppler imaging. Femoral Vein: No evidence of thrombus. Normal compressibility, respiratory phasicity and response to augmentation. Popliteal Vein: No evidence of thrombus. Normal compressibility, respiratory phasicity and response to augmentation. Calf Veins: No evidence of thrombus. Normal compressibility and flow on color Doppler imaging. Superficial Great Saphenous Vein: No evidence of thrombus. Normal compressibility and flow on color Doppler imaging. Venous Reflux:  None. Other Findings:  None. LEFT  LOWER EXTREMITY Common Femoral Vein: No evidence of thrombus. Normal compressibility, respiratory phasicity and response to augmentation. Saphenofemoral Junction: No evidence of thrombus. Normal compressibility and flow on color Doppler imaging. Profunda Femoral Vein: No evidence of thrombus. Normal compressibility and flow on color Doppler imaging. Femoral Vein: No evidence of thrombus. Normal compressibility, respiratory phasicity and response to augmentation. Popliteal Vein: No evidence of thrombus. Normal compressibility, respiratory phasicity and response to augmentation. Calf Veins: No evidence of thrombus. Normal compressibility and flow on color Doppler imaging. Superficial Great Saphenous Vein: No evidence of thrombus. Normal compressibility and flow on color Doppler imaging. Venous Reflux:  None. Other Findings:  None. IMPRESSION: No evidence of deep venous thrombosis. Electronically Signed   By: Jerilynn Mages.  Shick M.D.   On: 01/04/2016 16:22    EKG: Orders placed or performed during the hospital encounter of 01/04/16  . ED EKG  . ED EKG  . EKG 12-Lead  . EKG 12-Lead  EKG in the emergency room reveals sinus tachycardia with frequent premature ventricular complexes, rate of 105 bpm, normal axis, possible left atrial enlargement, no acute ST-T changes  IMPRESSION AND PLAN:  Active Problems:   COPD exacerbation (HCC)   Acute on chronic respiratory failure with hypoxia (HCC)   Leukocytosis #1. Acute on chronic respiratory failure with hypoxia, continue oxygen therapy as needed, wean off as tolerated, get d-dimer, CT angiogram  of the chest. If d-dimer is positive #2. COPD exacerbation, initiate steroids, nebulizing therapy, inhalers, follow clinically. #3. Acute bronchitis, start Rocephin, doxycycline, get sputum cultures if possible, follow clinically #4. Leukocytosis, follow with therapy #5. Tobacco abuse. Counseling, discussed this patient for approximately 3 minutes, she quit about a month ago,  still intermittently uses nicotine replacement therapy, order nicotine replacement as needed   All the records are reviewed and case discussed with ED provider. Management plans discussed with the patient, family and they are in agreement.  CODE STATUS: Code Status History    Date Active Date Inactive Code Status Order ID Comments User Context   10/31/2015  7:33 AM 11/02/2015  8:11 PM Full Code [id number]  Harrie Foreman, MD ED   05/06/2015  5:11 PM 05/14/2015  5:49 PM Full Code [id number]  Wilhelmina Mcardle, MD ED   12/08/2014  9:33 PM 12/10/2014  3:11 PM Full Code [id number]  Theodoro Grist, MD Inpatient   07/15/2014  9:24 PM 07/17/2014  2:06 PM Full Code [id number]  Idelle Crouch, MD Inpatient       TOTAL TIME TAKING CARE OF THIS PATIENT: 55 minutes.    Theodoro Grist M.D on 01/04/2016 at 6:17 PM  Between 7am to 6pm - Pager - [phone] After 6pm go to [email or web address] - password [email or web address] [facility]  Office  [phone]  CC: Primary care physician; Letta Median, MD

## 2016-01-04 NOTE — ED Provider Notes (Signed)
[place] lower extremities IMPRESSION: No evidence of deep venous thrombosis.  CXR IMPRESSION: COPD with bilateral chronic scarring.  Labs Reviewed  CBC WITH DIFFERENTIAL/PLATELET - Abnormal; Notable for the following:       Result Value   WBC 12.2 (*)    Neutro Abs 7.5 (*)    Lymphs Abs 3.7 (*)    All other components within normal limits  BASIC METABOLIC PANEL - Abnormal; Notable for the following:    BUN 22 (*)    Calcium 8.8 (*)    All other components within normal limits  RAPID INFLUENZA A&B ANTIGENS ([facility] ONLY)  TROPONIN I  MAGNESIUM  BLOOD GAS, VENOUS   Patient continues to feel short of breath with ambulation. On ausculation she does have some air movement, however quite diminished diffusely. Will plan on admitting to the hospital for further management and work up.   Nance Pear, MD 01/04/16 1728

## 2016-01-04 NOTE — ED Provider Notes (Signed)
[facility] Emergency Department Provider Note  ____________________________________________  Time seen: Approximately 2:47 PM  I have reviewed the triage vital signs and the nursing notes.   HISTORY  Chief Complaint No chief complaint on file.   HPI [name] [name] is a 55 y.o. female with a history of COPD who presents for evaluation of shortness of breath. Patient reports 8 days of cough productive of yellow sputum, shortness of breath, wheezing, and chills. She went to UC one week ago and was told she had an UTI and COPD exacerbation. She was put on prednisone which ended yesterday and Ceftin year. Patient reports that her shortness of breath has been getting progressively worse and her inhalers have not been working for the last 2 days. Patient reports that she uses oxygen just as needed however for the last week she has been on 2 L nasal cannula constantly. She denies chest pain. She denies fever but endorses chills. She did not take her flu shot this year. Patient is also complaining of bilateral leg pain that has been going on for a month and a half. No swelling, no personal or family history blood clots, no recent travel or immobilization, no hemoptysis exogenous hormones.  Past Medical History:  Diagnosis Date  . Breast cancer ([place])   . Cancer ([place])   . COPD (chronic obstructive pulmonary disease) [facility])     Patient Active Problem List   Diagnosis Date Noted  . Acute on chronic respiratory failure with hypoxia ([place]) 01/04/2016  . Leukocytosis 01/04/2016  . COPD (chronic obstructive pulmonary disease) ([place]) 01/04/2016  . Respiratory failure with hypercapnia ([place]) 10/31/2015  . Anxiety 05/06/2015  . Smoker 05/06/2015  . Emphysema lung ([place]) 05/06/2015  . Acute on chronic respiratory failure ([place]) 07/15/2014  . COPD exacerbation ([place]) 07/15/2014  . Acute bronchitis 07/15/2014  . Tachycardia 07/15/2014    Past Surgical History:  Procedure  Laterality Date  . ABDOMINAL HYSTERECTOMY      Prior to Admission medications   Medication Sig Start Date End Date Taking? Authorizing Provider  albuterol (PROAIR HFA) 108 (90 Base) MCG/ACT inhaler INHALE 2 PUFFS BY MOUTH INTO THE LUNGS EVERY 6 HOURS AS NEEDED FOR WHEEZING OR SHORTNESS OF BREATH Patient taking differently: Inhale 2 puffs into the lungs every 6 (six) hours as needed for wheezing or shortness of breath.  12/17/15  Yes Flora Lipps, MD  ALPRAZolam Duanne Moron) 1 MG tablet Take 0.5 tablets (0.5 mg total) by mouth 3 (three) times daily as needed for anxiety. 10/01/15  Yes Flora Lipps, MD  aspirin 81 MG EC tablet Take 1 tablet (81 mg total) by mouth daily. 05/14/15  Yes Demetrios Loll, MD  cefdinir (OMNICEF) 300 MG capsule Take 300 mg by mouth 2 (two) times daily. For 10 days 12/27/15 01/06/16 Yes Historical Provider, MD  cetirizine (ZYRTEC ALLERGY) 10 MG tablet Take 1 tablet (10 mg total) by mouth daily. 08/09/15  Yes Flora Lipps, MD  fluticasone (FLONASE) 50 MCG/ACT nasal spray Place 1 spray into both nostrils daily. 05/31/15 05/30/16 Yes Flora Lipps, MD  guaiFENesin (MUCINEX) 600 MG 12 hr tablet Take 1 tablet (600 mg total) by mouth 2 (two) times daily as needed for cough or to loosen phlegm. 05/14/15  Yes Demetrios Loll, MD  ipratropium-albuterol (DUONEB) 0.5-2.5 (3) MG/3ML SOLN Take 3 mLs by nebulization every 4 (four) hours as needed (for wheezing/shortness of breath). Patient taking differently: Take 3 mLs by nebulization every 4 (four) hours as needed. For wheezing/shortness of breath  07/13/15  Yes Flora Lipps, MD  mometasone-formoterol (DULERA) 200-5 MCG/ACT AERO Inhale 2 puffs into the lungs 2 (two) times daily. 12/17/15  Yes Flora Lipps, MD  tiotropium (SPIRIVA) 18 MCG inhalation capsule Place 1 capsule (18 mcg total) into inhaler and inhale daily. 10/01/15  Yes Flora Lipps, MD  levofloxacin (LEVAQUIN) 500 MG tablet Take 1 tablet (500 mg total) by mouth daily. Patient not taking: Reported on 01/04/2016  11/03/15   Henreitta Leber, MD  predniSONE (DELTASONE) 10 MG tablet 40 mg daily for 2 days, 20 mg daily for 2 days, 10 mg daily for 3 days. 01/06/16   Demetrios Loll, MD  QUEtiapine (SEROQUEL) 50 MG tablet Take 1 tablet (50 mg total) by mouth at bedtime. Patient not taking: Reported on 01/04/2016 05/14/15   Demetrios Loll, MD    Allergies Zithromax [azithromycin] and Penicillins  Family History  Problem Relation Age of Onset  . Breast cancer Mother   . Lung cancer Mother     Social History Social History  Substance Use Topics  . Smoking status: Former Smoker    Packs/day: 0.50    Years: 40.00  . Smokeless tobacco: Never Used     Comment: quit smoking 09/26/15  . Alcohol use No    Review of Systems  Constitutional: Negative for fever. Eyes: Negative for visual changes. ENT: Negative for sore throat. Neck: No neck pain  Cardiovascular: Negative for chest pain. Respiratory: + shortness of breath, cough, and wheezing Gastrointestinal: Negative for abdominal pain, vomiting or diarrhea. Genitourinary: Negative for dysuria. Musculoskeletal: Negative for back pain. + b/l LE pain Skin: Negative for rash. Neurological: Negative for headaches, weakness or numbness. Psych: No SI or HI  ____________________________________________   PHYSICAL EXAM:  VITAL SIGNS: ED Triage Vitals  Enc Vitals Group     BP 01/04/16 1404 (!) 193/168     Pulse Rate 01/04/16 1404 (!) 122     Resp 01/04/16 1404 (!) 28     Temp 01/04/16 1404 97.9 F (36.6 C)     Temp Source 01/04/16 1404 Oral     SpO2 01/04/16 1404 98 %     Weight 01/04/16 1405 145 lb (65.8 kg)     Height 01/04/16 1405 5\' 2"  (1.575 m)     Head Circumference --      Peak Flow --      Pain Score 01/04/16 1409 0     Pain Loc --      Pain Edu? --      Excl. in [place]? --     Constitutional: Alert and oriented. Well appearing and in no apparent distress. HEENT:      Head: Normocephalic and atraumatic.         Eyes: Conjunctivae are normal.  Sclera is non-icteric. EOMI. PERRL      Mouth/Throat: Mucous membranes are moist.       Neck: Supple with no signs of meningismus. Cardiovascular: Tachycardic with regular rhythm. No murmurs, gallops, or rubs. 2+ symmetrical distal pulses are present in all extremities. No JVD. Respiratory: Mild respiratory distress, severely diminished air movement, no crackles or wheezes, patient is tachypneic with respiratory rate in the upper 20s and satting 98% on 2 L nasal cannula.  Gastrointestinal: Soft, non tender, and non distended with positive bowel sounds. No rebound or guarding. Genitourinary: No CVA tenderness. Musculoskeletal: Nontender with normal range of motion in all extremities. No edema, cyanosis, or erythema of extremities. Neurologic: Normal speech and language. Face is symmetric. Moving all extremities. No  gross focal neurologic deficits are appreciated. Skin: Skin is warm, dry and intact. No rash noted. Psychiatric: Mood and affect are normal. Speech and behavior are normal.  ____________________________________________   LABS (all labs ordered are listed, but only abnormal results are displayed)  Labs Reviewed  CBC WITH DIFFERENTIAL/PLATELET - Abnormal; Notable for the following:       Result Value   WBC 12.2 (*)    Neutro Abs 7.5 (*)    Lymphs Abs 3.7 (*)    All other components within normal limits  BASIC METABOLIC PANEL - Abnormal; Notable for the following:    BUN 22 (*)    Calcium 8.8 (*)    All other components within normal limits  BASIC METABOLIC PANEL - Abnormal; Notable for the following:    Glucose, Bld 137 (*)    All other components within normal limits  CULTURE, EXPECTORATED SPUTUM-ASSESSMENT  CULTURE, RESPIRATORY (NON-EXPECTORATED)  TROPONIN I  MAGNESIUM  INFLUENZA PANEL BY PCR (TYPE A & B, H1N1)  FIBRIN DERIVATIVES D-DIMER ([facility] ONLY)  CBC  TROPONIN I  TROPONIN I  TROPONIN I   ____________________________________________  EKG  ED ECG REPORT I,  Rudene Re, the attending physician, personally viewed and interpreted this ECG.  Sinus tachycardia with frequent PVCs, rate of 105, normal intervals, normal axis, no ST elevations or depressions. EKG unchanged from prior ____________________________________________  RADIOLOGY  CXR:  COPD with bilateral chronic scarring. ____________________________________________   PROCEDURES  Procedure(s) performed: None Procedures Critical Care performed:  None ____________________________________________   INITIAL IMPRESSION / ASSESSMENT AND PLAN / ED COURSE  55 y.o. female with a history of COPD who presents for evaluation of shortness of breath, wheezing and cough concerning for COPD exacerbation. Patient was severely diminished air movement, tachypneic, and satting 98% on 2 L nasal cannula. We'll give DuoNeb treatments 3, Solu-Medrol. We'll check basic labs. Chest x-ray with no evidence of pneumonia. We'll check flu. Patient also complaining of bilateral lower extremity pain, no pitting edema, no asymmetric swelling. Dopplers have been ordered to rule out a DVT. Labs are pending.  Clinical Course    Patient signed out to Dr. Archie Balboa at the end of my shift. Labs and imaging pending.  Pertinent labs & imaging results that were available during my care of the patient were reviewed by me and considered in my medical decision making (see chart for details).    ____________________________________________   FINAL CLINICAL IMPRESSION(S) / ED DIAGNOSES  Final diagnoses:  SOB (shortness of breath)      NEW MEDICATIONS STARTED DURING THIS VISIT:  Current Discharge Medication List    START taking these medications   Details  predniSONE (DELTASONE) 10 MG tablet 40 mg daily for 2 days, 20 mg daily for 2 days, 10 mg daily for 3 days. Qty: 15 tablet, Refills: 0         Note:  This document was prepared using Dragon voice recognition software and may include unintentional  dictation errors.    Rudene Re, MD 01/06/16 [phone]

## 2016-01-04 NOTE — ED Notes (Signed)
Ambulated patient with pulse ox on. Resting with 2L [place] was 100%. Resting Room air patient was 96%. Patient ambulated around ED O2 dropped to 80%. Patient was labored, using accessory muscles and shoulders. Patient was unable to speak a complete sentence. Patient back on 2L [place] and O2 is 97%.

## 2016-01-05 DIAGNOSIS — R0602 Shortness of breath: Secondary | ICD-10-CM | POA: Diagnosis not present

## 2016-01-05 DIAGNOSIS — J440 Chronic obstructive pulmonary disease with acute lower respiratory infection: Secondary | ICD-10-CM | POA: Diagnosis not present

## 2016-01-05 LAB — TROPONIN I
Troponin I: <0.03 ng/mL (ref <0.03)
Troponin I: <0.03 ng/mL (ref <0.03)

## 2016-01-05 LAB — CBC
HCT: 38.4 % (ref 35.0–47.0)
Hemoglobin: 12.9 g/dL (ref 12.0–16.0)
MCH: 28.8 pg (ref 26.0–34.0)
MCHC: 33.6 g/dL (ref 32.0–36.0)
MCV: 85.7 fL (ref 80.0–100.0)
PLATELETS: 287 10*3/uL (ref 150–440)
RBC: 4.48 MIL/uL (ref 3.80–5.20)
RDW: 12.8 % (ref 11.5–14.5)
WBC: 9.9 10*3/uL (ref 3.6–11.0)

## 2016-01-05 LAB — BASIC METABOLIC PANEL
Anion gap: 5 (ref 5–15)
BUN: 19 mg/dL (ref 6–20)
CHLORIDE: 105 mmol/L (ref 101–111)
CO2: 28 mmol/L (ref 22–32)
CREATININE: 0.72 mg/dL (ref 0.44–1.00)
Calcium: 9 mg/dL (ref 8.9–10.3)
GFR calc Af Amer: >60 mL/min (ref >60)
GFR calc non Af Amer: >60 mL/min (ref >60)
Glucose, Bld: 137 mg/dL — ABNORMAL HIGH (ref 65–99)
Potassium: 4.3 mmol/L (ref 3.5–5.1)
Sodium: 138 mmol/L (ref 135–145)

## 2016-01-05 LAB — EXPECTORATED SPUTUM ASSESSMENT W GRAM STAIN, RFLX TO RESP C

## 2016-01-05 LAB — EXPECTORATED SPUTUM ASSESSMENT W REFEX TO RESP CULTURE

## 2016-01-05 MED ORDER — METHYLPREDNISOLONE SODIUM SUCC 125 MG IJ SOLR
60.0000 mg | Freq: Two times a day (BID) | INTRAMUSCULAR | Status: DC
  Administered 2016-01-05 – 2016-01-06 (×3): 60 mg via INTRAVENOUS
  Filled 2016-01-05 (×3): qty 2

## 2016-01-05 MED ORDER — PNEUMOCOCCAL VAC POLYVALENT 25 MCG/0.5ML IJ INJ
0.5000 mL | INJECTION | INTRAMUSCULAR | Status: AC
  Administered 2016-01-06: 0.5 mL via INTRAMUSCULAR
  Filled 2016-01-05: qty 0.5

## 2016-01-05 MED ORDER — INFLUENZA VAC SPLIT QUAD 0.5 ML IM SUSY
0.5000 mL | PREFILLED_SYRINGE | INTRAMUSCULAR | Status: AC
  Administered 2016-01-06: 0.5 mL via INTRAMUSCULAR
  Filled 2016-01-05: qty 0.5

## 2016-01-05 NOTE — Progress Notes (Signed)
[facility] [place] [facility] [place] NAME: Rikki Liddiard    MR#:  [id number]  DATE OF BIRTH:  [birth year]  SUBJECTIVE:  CHIEF COMPLAINT:  No chief complaint on file.  Better cough and shortness of breath. On O2 [place] 2L. REVIEW OF SYSTEMS:  Review of Systems  Constitutional: Negative for chills, fever and malaise/fatigue.  HENT: Negative for congestion.   Eyes: Negative for blurred vision and double vision.  Respiratory: Positive for cough and shortness of breath. Negative for hemoptysis, wheezing and stridor.   Cardiovascular: Negative for chest pain and leg swelling.  Gastrointestinal: Negative for abdominal pain, blood in stool, diarrhea, melena, nausea and vomiting.  Genitourinary: Negative for dysuria and hematuria.  Musculoskeletal: Negative for joint pain.  Neurological: Negative for dizziness, focal weakness and loss of consciousness.  Psychiatric/Behavioral: Negative for depression. The patient is not nervous/anxious.     DRUG ALLERGIES:   Allergies  Allergen Reactions  . Zithromax [Azithromycin] Other (See Comments)    Patient reports that medication makes breathing worse  . Penicillins Swelling, Rash and Other (See Comments)    Pt states that she had tongue swelling.   Has patient had a PCN reaction causing immediate rash, facial/tongue/throat swelling, SOB or lightheadedness with hypotension: Yes Has patient had a PCN reaction causing severe rash involving mucus membranes or skin necrosis: No Has patient had a PCN reaction that required hospitalization No Has patient had a PCN reaction occurring within the last 10 years: No If all of the above answers are "NO", then may proceed with Cephalosporin use.   VITALS:  Blood pressure 121/80, pulse 98, temperature 98.6 F (37 C), temperature source Oral, resp. rate 20, height 5\' 2"  (1.575 m), weight 137 lb 4.8 oz (62.3 kg), SpO2 98 %. PHYSICAL EXAMINATION:  Physical Exam  Constitutional:  She is oriented to person, place, and time and well-developed, well-nourished, and in no distress.  HENT:  Head: Normocephalic.  Eyes: Conjunctivae and EOM are normal. No scleral icterus.  Neck: Normal range of motion. Neck supple. No JVD present. No tracheal deviation present.  Cardiovascular: Normal rate, regular rhythm and normal heart sounds.  Exam reveals no gallop.   No murmur heard. Pulmonary/Chest: Effort normal. No respiratory distress. She has no wheezes.  Diminished lung sounds  Abdominal: Soft. Bowel sounds are normal. She exhibits no distension. There is no tenderness.  Musculoskeletal: Normal range of motion. She exhibits no edema or tenderness.  Neurological: She is alert and oriented to person, place, and time. No cranial nerve deficit.  Skin: No rash noted. No erythema.  Psychiatric: Affect normal.   LABORATORY PANEL:   CBC  Recent Labs Lab 01/05/16 0319  WBC 9.9  HGB 12.9  HCT 38.4  PLT 287   ------------------------------------------------------------------------------------------------------------------ Chemistries   Recent Labs Lab 01/04/16 1516 01/05/16 0319  NA 139 138  K 3.9 4.3  CL 104 105  CO2 27 28  GLUCOSE 97 137*  BUN 22* 19  CREATININE 0.76 0.72  CALCIUM 8.8* 9.0  MG 1.8  --    RADIOLOGY:  US Venous Img Lower Bilateral  Result Date: 01/04/2016 CLINICAL DATA:  Lower extremity pain and edema for 1 month. EXAM: BILATERAL LOWER EXTREMITY VENOUS DOPPLER ULTRASOUND TECHNIQUE: Gray-scale sonography with graded compression, as well as color Doppler and duplex ultrasound were performed to evaluate the lower extremity deep venous systems from the level of the common femoral vein and including the common femoral, femoral, profunda femoral, popliteal and calf veins including  the posterior tibial, peroneal and gastrocnemius veins when visible. The superficial great saphenous vein was also interrogated. Spectral Doppler was utilized to evaluate flow at  rest and with distal augmentation maneuvers in the common femoral, femoral and popliteal veins. COMPARISON:  None. FINDINGS: RIGHT LOWER EXTREMITY Common Femoral Vein: No evidence of thrombus. Normal compressibility, respiratory phasicity and response to augmentation. Saphenofemoral Junction: No evidence of thrombus. Normal compressibility and flow on color Doppler imaging. Profunda Femoral Vein: No evidence of thrombus. Normal compressibility and flow on color Doppler imaging. Femoral Vein: No evidence of thrombus. Normal compressibility, respiratory phasicity and response to augmentation. Popliteal Vein: No evidence of thrombus. Normal compressibility, respiratory phasicity and response to augmentation. Calf Veins: No evidence of thrombus. Normal compressibility and flow on color Doppler imaging. Superficial Great Saphenous Vein: No evidence of thrombus. Normal compressibility and flow on color Doppler imaging. Venous Reflux:  None. Other Findings:  None. LEFT LOWER EXTREMITY Common Femoral Vein: No evidence of thrombus. Normal compressibility, respiratory phasicity and response to augmentation. Saphenofemoral Junction: No evidence of thrombus. Normal compressibility and flow on color Doppler imaging. Profunda Femoral Vein: No evidence of thrombus. Normal compressibility and flow on color Doppler imaging. Femoral Vein: No evidence of thrombus. Normal compressibility, respiratory phasicity and response to augmentation. Popliteal Vein: No evidence of thrombus. Normal compressibility, respiratory phasicity and response to augmentation. Calf Veins: No evidence of thrombus. Normal compressibility and flow on color Doppler imaging. Superficial Great Saphenous Vein: No evidence of thrombus. Normal compressibility and flow on color Doppler imaging. Venous Reflux:  None. Other Findings:  None. IMPRESSION: No evidence of deep venous thrombosis. Electronically Signed   By: Jerilynn Mages.  Shick M.D.   On: 01/04/2016 16:22   ASSESSMENT  AND PLAN:   1. Acute on chronic respiratory failure with hypoxia,  continue oxygen, NEB.  #2. COPD exacerbation,  Taper steroids, nebulizing therapy, inhalers.  #3. Acute bronchitis,  Continue Rocephin, doxycycline.  #4. Leukocytosis, improved.  #5. Tobacco abuse. Counseled. All the records are reviewed and case discussed with Care Management/Social Worker. Management plans discussed with the patient, family and they are in agreement.  CODE STATUS: Full code  TOTAL TIME TAKING CARE OF THIS PATIENT: 37 minutes.   More than 50% of the time was spent in counseling/coordination of care: YES  POSSIBLE D/C IN 2 DAYS, DEPENDING ON CLINICAL CONDITION.   Demetrios Loll M.D on 01/05/2016 at 2:53 PM  Between 7am to 6pm - Pager - [phone]  After 6pm go to [email or web address] - Proofreader  [facility]  Office  [phone]  CC: Primary care physician; Letta Median, MD  Note: This dictation was prepared with Dragon dictation along with smaller phrase technology. Any transcriptional errors that result from this process are unintentional.

## 2016-01-05 NOTE — Care Management Note (Signed)
Case Management Note  Patient Details  Name: [name] [name] MRN: [id number] Date of Birth: 17-Apr-1960  Subjective/Objective: Pt class converted to inpatient on 12.1.17 @ 1832. EPIC shows observation for pt class.                   Action/Plan:   Expected Discharge Date:                  Expected Discharge Plan:     In-House Referral:     Discharge planning Services     Post Acute Care Choice:    Choice offered to:     DME Arranged:    DME Agency:     HH Arranged:    HH Agency:     Status of Service:     If discussed at [employer] of Stay Meetings, dates discussed:    Additional Comments:  Ival Bible, RN 01/05/2016, 2:38 PM

## 2016-01-06 ENCOUNTER — Inpatient Hospital Stay: Payer: Medicare Other

## 2016-01-06 DIAGNOSIS — J440 Chronic obstructive pulmonary disease with acute lower respiratory infection: Secondary | ICD-10-CM | POA: Diagnosis not present

## 2016-01-06 DIAGNOSIS — R0602 Shortness of breath: Secondary | ICD-10-CM | POA: Diagnosis not present

## 2016-01-06 LAB — TROPONIN I: Troponin I: <0.03 ng/mL (ref <0.03)

## 2016-01-06 MED ORDER — PREDNISONE 10 MG PO TABS: ORAL_TABLET | ORAL | 0 refills | Status: DC

## 2016-01-06 MED ORDER — NITROGLYCERIN 0.4 MG SL SUBL
0.4000 mg | SUBLINGUAL_TABLET | SUBLINGUAL | Status: DC | PRN
  Administered 2016-01-06: 06:00:00 0.4 mg via SUBLINGUAL
  Filled 2016-01-06 (×2): qty 1

## 2016-01-06 MED ORDER — IPRATROPIUM-ALBUTEROL 0.5-2.5 (3) MG/3ML IN SOLN
3.0000 mL | Freq: Four times a day (QID) | RESPIRATORY_TRACT | Status: DC
  Administered 2016-01-06 – 2016-01-07 (×4): 3 mL via RESPIRATORY_TRACT
  Filled 2016-01-06 (×4): qty 3

## 2016-01-06 MED ORDER — PREDNISONE 20 MG PO TABS
40.0000 mg | ORAL_TABLET | Freq: Every day | ORAL | Status: DC
  Administered 2016-01-07: 40 mg via ORAL
  Filled 2016-01-06: qty 2

## 2016-01-06 NOTE — Discharge Instructions (Signed)
Heart healthy diet. Home O2 [place] 2L. Smoking cessation.

## 2016-01-06 NOTE — Progress Notes (Signed)
Pt very concerned about being discharged today. She states she feels very SOB with exertion and is concerned about being able to adequately care for herself at home. I ambulated with patient in the hallway with her chronic 2LPM via O2 and her sats remained >/=94%, but she reported feeling very SOB. I spoke to Dr. Bridgett Larsson about pt's concerns and he has canceled the patient's discharge today.

## 2016-01-06 NOTE — Progress Notes (Signed)
[facility] [place] NAME: [name] [name]    MR#:  [id number]  DATE OF BIRTH:  10/28/1960  SUBJECTIVE:  CHIEF COMPLAINT:  No chief complaint on file.  Still cough and shortness of breath. On O2 [place] 2L. REVIEW OF SYSTEMS:  Review of Systems  Constitutional: Negative for chills, fever and malaise/fatigue.  HENT: Negative for congestion.   Eyes: Negative for blurred vision and double vision.  Respiratory: Positive for cough and shortness of breath. Negative for hemoptysis, wheezing and stridor.   Cardiovascular: Negative for chest pain and leg swelling.  Gastrointestinal: Negative for abdominal pain, blood in stool, diarrhea, melena, nausea and vomiting.  Genitourinary: Negative for dysuria and hematuria.  Musculoskeletal: Negative for joint pain.  Neurological: Negative for dizziness, focal weakness and loss of consciousness.  Psychiatric/Behavioral: Negative for depression. The patient is not nervous/anxious.     DRUG ALLERGIES:   Allergies  Allergen Reactions  . Zithromax [Azithromycin] Other (See Comments)    Patient reports that medication makes breathing worse  . Penicillins Swelling, Rash and Other (See Comments)    Pt states that she had tongue swelling.   Has patient had a PCN reaction causing immediate rash, facial/tongue/throat swelling, SOB or lightheadedness with hypotension: Yes Has patient had a PCN reaction causing severe rash involving mucus membranes or skin necrosis: No Has patient had a PCN reaction that required hospitalization No Has patient had a PCN reaction occurring within the last 10 years: No If all of the above answers are "NO", then may proceed with Cephalosporin use.   VITALS:  Blood pressure 105/73, pulse (!) 108, temperature 98 F (36.7 C), temperature source Oral, resp. rate 18, height 5\' 2"  (1.575 m), weight 137 lb 4.8 oz (62.3 kg), SpO2 99 %. PHYSICAL EXAMINATION:  Physical Exam    Constitutional: She is oriented to person, place, and time and well-developed, well-nourished, and in no distress.  HENT:  Head: Normocephalic.  Eyes: Conjunctivae and EOM are normal. No scleral icterus.  Neck: Normal range of motion. Neck supple. No JVD present. No tracheal deviation present.  Cardiovascular: Normal rate, regular rhythm and normal heart sounds.  Exam reveals no gallop.   No murmur heard. Pulmonary/Chest: Effort normal. No respiratory distress. She has no wheezes.  Diminished lung sounds  Abdominal: Soft. Bowel sounds are normal. She exhibits no distension. There is no tenderness.  Musculoskeletal: Normal range of motion. She exhibits no edema or tenderness.  Neurological: She is alert and oriented to person, place, and time. No cranial nerve deficit.  Skin: No rash noted. No erythema.  Psychiatric: Affect normal.   LABORATORY PANEL:   CBC  Recent Labs Lab 01/05/16 0319  WBC 9.9  HGB 12.9  HCT 38.4  PLT 287   ------------------------------------------------------------------------------------------------------------------ Chemistries   Recent Labs Lab 01/04/16 1516 01/05/16 0319  NA 139 138  K 3.9 4.3  CL 104 105  CO2 27 28  GLUCOSE 97 137*  BUN 22* 19  CREATININE 0.76 0.72  CALCIUM 8.8* 9.0  MG 1.8  --    RADIOLOGY:  Dg Chest Port 1 View  Result Date: 01/06/2016 CLINICAL DATA:  Acute onset of generalized chest pain. Initial encounter. EXAM: PORTABLE CHEST 1 VIEW COMPARISON:  Chest radiograph from 01/04/2016 FINDINGS: The lungs are hyperexpanded, with flattening of the hemidiaphragms, compatible with COPD. Bilateral midlung scarring is again noted. There is no evidence of pleural effusion or pneumothorax. The cardiomediastinal silhouette is within normal limits. No acute osseous abnormalities  are seen. IMPRESSION: Findings of COPD. Bilateral midlung scarring again noted. Lungs otherwise grossly clear. Electronically Signed   By: Garald Balding M.D.    On: 01/06/2016 06:22   ASSESSMENT AND PLAN:   1. Acute on chronic respiratory failure with hypoxia,  continue oxygen, NEB prn.  #2. COPD exacerbation,  Taper steroids, nebulizing therapy, inhalers.  #3. Acute bronchitis,  discontinue Rocephin, continue doxycycline.  #4. Leukocytosis, improved.  #5. Tobacco abuse. Counseled. All the records are reviewed and case discussed with Care Management/Social Worker. Management plans discussed with the patient, family and they are in agreement.  CODE STATUS: Full code  TOTAL TIME TAKING CARE OF THIS PATIENT: 26 minutes.   More than 50% of the time was spent in counseling/coordination of care: YES  POSSIBLE D/C IN 1-2 DAYS, DEPENDING ON CLINICAL CONDITION.   Demetrios Loll M.D on 01/06/2016 at 6:05 PM  Between 7am to 6pm - Pager - [phone]  After 6pm go to [email or web address] - Proofreader  [facility]  Office  [phone]  CC: Primary care physician; Letta Median, MD  Note: This dictation was prepared with Dragon dictation along with smaller phrase technology. Any transcriptional errors that result from this process are unintentional.

## 2016-01-06 NOTE — Progress Notes (Signed)
Pt due for flu shot today. While completing questionnaire pt did state she has had a reaction to latex in the past. She states she had a localized pruritic rash in response to latex, but denies generalized rash, CP, SOB, and facial swelling when exposed to latex. She also states she has not ever had a negative reaction to the flu shot in the past. Dr. Bridgett Larsson notified, he stated it is okay to give pt the flu shot.

## 2016-01-06 NOTE — Care Management Important Message (Signed)
Important Message  Patient Details  Name: [name] [name] MRN: [id number] Date of Birth: May 19, 1960   Medicare Important Message Given:  Yes    Julliette Frentz A, RN 01/06/2016, 4:11 PM

## 2016-01-06 NOTE — Care Management Note (Signed)
Case Management Note  Patient Details  Name: [name] [name] MRN: [id number] Date of Birth: 12-Sep-1960  Subjective/Objective:     No new home health services orders, but is on chronic oxygen at home. Her nurse has explained to her that either her daughter or someone else will need to bring her portable oxygen tank from home to her hospital room so that she can be discharged home.                Action/Plan:   Expected Discharge Date:                  Expected Discharge Plan:     In-House Referral:     Discharge planning Services     Post Acute Care Choice:    Choice offered to:     DME Arranged:    DME Agency:     HH Arranged:    HH Agency:     Status of Service:     If discussed at [employer] of Stay Meetings, dates discussed:    Additional Comments:  [name] [name] A, RN 01/06/2016, 10:35 AM

## 2016-01-07 DIAGNOSIS — J440 Chronic obstructive pulmonary disease with acute lower respiratory infection: Secondary | ICD-10-CM | POA: Diagnosis not present

## 2016-01-07 DIAGNOSIS — Z23 Encounter for immunization: Secondary | ICD-10-CM | POA: Diagnosis not present

## 2016-01-07 DIAGNOSIS — R0602 Shortness of breath: Secondary | ICD-10-CM | POA: Diagnosis present

## 2016-01-07 LAB — CULTURE, RESPIRATORY

## 2016-01-07 LAB — CULTURE, RESPIRATORY W GRAM STAIN: Culture: NORMAL

## 2016-01-07 NOTE — Plan of Care (Signed)
Pt d/ced home.  Will be on prednisone taper.  Patient states that she's much improved from yesterday.  Breath sounds are diminished but clear.  Still using accessory muscles.  She is a chronic O2 user at home.  Did review w/Dr. Bridgett Larsson that she had a run of trigeminy yesterday.  Sarah, RN from yesterday called me to make sure I passed that on.  Debbie, CNA removed IV.  Reviewed d/c instructions and f/u appts.  Patient leaving with daughter.

## 2016-01-07 NOTE — Discharge Summary (Signed)
[facility] at [place] NAME: [name] [name]    MR#:  [id number]  DATE OF BIRTH:  1960/06/03  DATE OF ADMISSION:  01/04/2016   ADMITTING PHYSICIAN: Theodoro Grist, MD  DATE OF DISCHARGE: 01/07/2016 10:05 AM  PRIMARY CARE PHYSICIAN: Letta Median, MD   ADMISSION DIAGNOSIS:  Diff breathing DISCHARGE DIAGNOSIS:  Active Problems:   COPD exacerbation (HCC)   Acute on chronic respiratory failure with hypoxia (HCC)   Leukocytosis   COPD (chronic obstructive pulmonary disease) ([place])  SECONDARY DIAGNOSIS:   Past Medical History:  Diagnosis Date  . Breast cancer ([place])   . Cancer ([place])   . COPD (chronic obstructive pulmonary disease) ([place])    HOSPITAL COURSE:  1. Acute on chronic respiratory failure with hypoxia,  continue oxygen, NEB prn.  #2. COPD exacerbation,  Taper steroids, nebulizing therapy, inhalers.  #3. Acute bronchitis, She is treated with Rocephin and doxycycline.  #4. Leukocytosis, improved.  #5. Tobacco abuse. Counseled. DISCHARGE CONDITIONS:  Stable, discharged to home today CONSULTS OBTAINED:   DRUG ALLERGIES:   Allergies  Allergen Reactions  . Zithromax [Azithromycin] Other (See Comments)    Patient reports that medication makes breathing worse  . Penicillins Swelling, Rash and Other (See Comments)    Pt states that she had tongue swelling.   Has patient had a PCN reaction causing immediate rash, facial/tongue/throat swelling, SOB or lightheadedness with hypotension: Yes Has patient had a PCN reaction causing severe rash involving mucus membranes or skin necrosis: No Has patient had a PCN reaction that required hospitalization No Has patient had a PCN reaction occurring within the last 10 years: No If all of the above answers are "NO", then may proceed with Cephalosporin use.   DISCHARGE MEDICATIONS:     Medication List    STOP taking these medications   cefdinir 300 MG capsule Commonly  known as:  OMNICEF     TAKE these medications   albuterol 108 (90 Base) MCG/ACT inhaler Commonly known as:  PROAIR HFA INHALE 2 PUFFS BY MOUTH INTO THE LUNGS EVERY 6 HOURS AS NEEDED FOR WHEEZING OR SHORTNESS OF BREATH What changed:  how much to take  how to take this  when to take this  reasons to take this  additional instructions   ALPRAZolam 1 MG tablet Commonly known as:  XANAX Take 0.5 tablets (0.5 mg total) by mouth 3 (three) times daily as needed for anxiety.   aspirin 81 MG EC tablet Take 1 tablet (81 mg total) by mouth daily.   cetirizine 10 MG tablet Commonly known as:  ZYRTEC ALLERGY Take 1 tablet (10 mg total) by mouth daily.   fluticasone 50 MCG/ACT nasal spray Commonly known as:  FLONASE Place 1 spray into both nostrils daily.   guaiFENesin 600 MG 12 hr tablet Commonly known as:  MUCINEX Take 1 tablet (600 mg total) by mouth 2 (two) times daily as needed for cough or to loosen phlegm.   ipratropium-albuterol 0.5-2.5 (3) MG/3ML Soln Commonly known as:  DUONEB Take 3 mLs by nebulization every 4 (four) hours as needed (for wheezing/shortness of breath). What changed:  reasons to take this  additional instructions   levofloxacin 500 MG tablet Commonly known as:  LEVAQUIN Take 1 tablet (500 mg total) by mouth daily.   mometasone-formoterol 200-5 MCG/ACT Aero Commonly known as:  DULERA Inhale 2 puffs into the lungs 2 (two) times daily.   predniSONE 10 MG tablet Commonly known as:  DELTASONE 40  mg daily for 2 days, 20 mg daily for 2 days, 10 mg daily for 3 days.   QUEtiapine 50 MG tablet Commonly known as:  SEROQUEL Take 1 tablet (50 mg total) by mouth at bedtime.   tiotropium 18 MCG inhalation capsule Commonly known as:  SPIRIVA Place 1 capsule (18 mcg total) into inhaler and inhale daily.        DISCHARGE INSTRUCTIONS:  See AVS. OXYGEN:  Home Oxygen:  3 L [place].  If you experience worsening of your admission symptoms, develop shortness  of breath, life threatening emergency, suicidal or homicidal thoughts you must seek medical attention immediately by calling 911 or calling your MD immediately  if symptoms less severe.  You Must read complete instructions/literature along with all the possible adverse reactions/side effects for all the Medicines you take and that have been prescribed to you. Take any new Medicines after you have completely understood and accpet all the possible adverse reactions/side effects.   Please note  You were cared for by a hospitalist during your hospital stay. If you have any questions about your discharge medications or the care you received while you were in the hospital after you are discharged, you can call the unit and asked to speak with the hospitalist on call if the hospitalist that took care of you is not available. Once you are discharged, your primary care physician will handle any further medical issues. Please note that NO REFILLS for any discharge medications will be authorized once you are discharged, as it is imperative that you return to your primary care physician (or establish a relationship with a primary care physician if you do not have one) for your aftercare needs so that they can reassess your need for medications and monitor your lab values.    On the day of Discharge:  VITAL SIGNS:  Blood pressure 108/68, pulse 80, temperature 98 F (36.7 C), temperature source Oral, resp. rate 20, height 5\' 2"  (1.575 m), weight 137 lb 4.8 oz (62.3 kg), SpO2 99 %. PHYSICAL EXAMINATION:  GENERAL:  55 y.o.-year-old patient lying in the bed with no acute distress.  EYES: Pupils equal, round, reactive to light and accommodation. No scleral icterus. Extraocular muscles intact.  HEENT: Head atraumatic, normocephalic. Oropharynx and nasopharynx clear.  NECK:  Supple, no jugular venous distention. No thyroid enlargement, no tenderness.  LUNGS: Diminished breath sounds bilaterally, no wheezing,  rales,rhonchi or crepitation. No use of accessory muscles of respiration.  CARDIOVASCULAR: S1, S2 normal. No murmurs, rubs, or gallops.  ABDOMEN: Soft, non-tender, non-distended. Bowel sounds present. No organomegaly or mass.  EXTREMITIES: No pedal edema, cyanosis, or clubbing.  NEUROLOGIC: Cranial nerves II through XII are intact. Muscle strength 5/5 in all extremities. Sensation intact. Gait not checked.  PSYCHIATRIC: The patient is alert and oriented x 3.  SKIN: No obvious rash, lesion, or ulcer.  DATA REVIEW:   CBC  Recent Labs Lab 01/05/16 0319  WBC 9.9  HGB 12.9  HCT 38.4  PLT 287    Chemistries   Recent Labs Lab 01/04/16 1516 01/05/16 0319  NA 139 138  K 3.9 4.3  CL 104 105  CO2 27 28  GLUCOSE 97 137*  BUN 22* 19  CREATININE 0.76 0.72  CALCIUM 8.8* 9.0  MG 1.8  --      Microbiology Results  Results for orders placed or performed during the hospital encounter of 01/04/16  Culture, sputum-assessment     Status: None   Collection Time: 01/04/16 10:14 PM  Result Value Ref Range Status   Specimen Description SPUTUM  Final   Special Requests NONE  Final   Sputum evaluation   Final    THIS SPECIMEN IS ACCEPTABLE FOR SPUTUM CULTURE Performed at [facility]    Report Status 01/05/2016 FINAL  Final  Culture, respiratory (NON-Expectorated)     Status: None   Collection Time: 01/04/16 10:14 PM  Result Value Ref Range Status   Specimen Description SPUTUM  Final   Special Requests NONE Reflexed from [id number]  Final   Gram Stain   Final    MODERATE WBC PRESENT, PREDOMINANTLY PMN FEW SQUAMOUS EPITHELIAL CELLS PRESENT RARE GRAM POSITIVE COCCI IN PAIRS RARE GRAM VARIABLE ROD    Culture   Final    Consistent with normal respiratory flora. Performed at [facility]    Report Status 01/07/2016 FINAL  Final    RADIOLOGY:  No results found.   Management plans discussed with the patient, family and they are in agreement.  CODE STATUS:  Code  Status History    Date Active Date Inactive Code Status Order ID Comments User Context   01/04/2016  7:52 PM 01/07/2016  1:37 PM Full Code [id number]  Theodoro Grist, MD Inpatient   10/31/2015  7:33 AM 11/02/2015  8:11 PM Full Code [id number]  Harrie Foreman, MD ED   05/06/2015  5:11 PM 05/14/2015  5:49 PM Full Code [id number]  Wilhelmina Mcardle, MD ED   12/08/2014  9:33 PM 12/10/2014  3:11 PM Full Code [id number]  Theodoro Grist, MD Inpatient   07/15/2014  9:24 PM 07/17/2014  2:06 PM Full Code [id number]  Idelle Crouch, MD Inpatient      TOTAL TIME TAKING CARE OF THIS PATIENT:32 minutes.    Demetrios Loll M.D on 01/07/2016 at 3:41 PM  Between 7am to 6pm - Pager - [phone]  After 6pm go to [email or web address] - Proofreader  [facility]  Office  [phone]  CC: Primary care physician; Letta Median, MD   Note: This dictation was prepared with Dragon dictation along with smaller phrase technology. Any transcriptional errors that result from this process are unintentional.

## 2016-01-11 ENCOUNTER — Ambulatory Visit (INDEPENDENT_AMBULATORY_CARE_PROVIDER_SITE_OTHER): Payer: Medicare Other | Admitting: Pulmonary Disease

## 2016-01-11 ENCOUNTER — Encounter: Payer: Self-pay | Admitting: Pulmonary Disease

## 2016-01-11 VITALS — BP 140/72 | HR 117

## 2016-01-11 DIAGNOSIS — J449 Chronic obstructive pulmonary disease, unspecified: Secondary | ICD-10-CM

## 2016-01-11 DIAGNOSIS — J439 Emphysema, unspecified: Secondary | ICD-10-CM

## 2016-01-11 DIAGNOSIS — J441 Chronic obstructive pulmonary disease with (acute) exacerbation: Secondary | ICD-10-CM | POA: Diagnosis not present

## 2016-01-11 MED ORDER — MOMETASONE FURO-FORMOTEROL FUM 200-5 MCG/ACT IN AERO: 2.0000 | INHALATION_SPRAY | Freq: Two times a day (BID) | RESPIRATORY_TRACT | 3 refills | Status: DC

## 2016-01-11 MED ORDER — PREDNISONE 5 MG PO TABS: 5.0000 mg | ORAL_TABLET | Freq: Every day | ORAL | 5 refills | Status: DC

## 2016-01-11 MED ORDER — CETIRIZINE HCL 10 MG PO TABS: 10.0000 mg | ORAL_TABLET | Freq: Every day | ORAL | 3 refills | Status: DC

## 2016-01-11 MED ORDER — TIOTROPIUM BROMIDE MONOHYDRATE 18 MCG IN CAPS: 18.0000 ug | ORAL_CAPSULE | Freq: Every day | RESPIRATORY_TRACT | 3 refills | Status: DC

## 2016-01-11 NOTE — Patient Instructions (Addendum)
1) We discussed the imperative of smoking cessation. Continue the nicotine replacement therapy (patches and gum) as needed to keep you away from cigarettes 2) Continue your current regimen of medications for COPD 3) After you complete the current prednisone taper that was prescribed in the hospital, continue on prednisone 5 mg daily. Ultimately, we will want to taper this medication to off 4) we discussed the consideration of lung transplantation in the future. I think you would be considered a good candidate for this 5) We have made a referral to pulmonary rehab program @ [facility] 6) Follow up in 4-6 weeks with Dr Mortimer Fries

## 2016-01-14 ENCOUNTER — Other Ambulatory Visit: Payer: Self-pay

## 2016-01-14 DIAGNOSIS — J449 Chronic obstructive pulmonary disease, unspecified: Secondary | ICD-10-CM

## 2016-01-14 NOTE — Progress Notes (Signed)
PULMONARY OFFICE FOLLOW UP   PROBLEMS: Very severe COPD Emphysema Chronic hypercarbic respiratory failure Recurrent exacerbations of COPD  DATA: CT chest 10/16/11: hyperinflated, centrilobular emphysema, hazy infiltrate in RLL and lingula, chronic scarring along the major fissure on R PFTs 06/04/15: Very severe obstruction (FEV1 0.55 L, 28% pred)  INTERVAL HISTORY: Hospitalized 12/01-12/04/17 for AECOPD under care of hospitalist service and treated in the usual manner with abx and systemic steroids in addition to nebulized bronchodilators and oxygen.    SUBJ: Pt of Dr Mortimer Fries. Last seen in office 10/01/15. Recent history as above. She is now very nearly back to her baselien of class III/IV dyspnea. Denies CP, fever, purulent sputum, hemoptysis, LE edema and calf tenderness. Denies CP, fever, purulent sputum, hemoptysis, LE edema and calf tenderness. She has not smoked since her discharge from the hospital. She is using NRT with patches and gum.  Her current pulmonary regimen is as follows: Dulera 200-5, 2 actuations BID Spiriva HH - once a day Prednisone 10 mg daily Albuterol MDI PRN Duoneb PRN BiPAP @ [facility]  She is presently completing a prednisone taper and has just completed a course of levofloxacin. She indicates that she is compliant with nocturnal BiPAP. She rarely uses the albuterol MDI. She used Duoneb 2 times on the day prior to this visit  OBJ: Vitals:   01/11/16 0940  BP: 140/72  Pulse: (!) 117  SpO2: 100%  Room air !!   DATA: CXR (01/06/16): hyperinflated, bilateral midlung scarring is again noted  IMPRESSION: 1) Very severe COPD with chronic hypercarbic respiratory failure 2) Emphysema 3) Recent AECOPD 4) Smoker - presently abstinent on NRT  She is relatively young with no other major chronic medical problems. If she can remain abstinent, we should consider referral for lung transplantation. If this is to be a consideration, we should try to reduce prednisone as  low as possible  PLAN: 1) Counseled re: the imperative of smoking cessation and we discussed strategies. Continue NRT - as much as is needed to keep her from smoking 2) Complete prednisone taper as prescribed, then prednisone 5 mg daily - I have changed her prescription to this 3) Continue the rest of her COPD regimen as listed above 4) Referral to pulmonary rehab program 5) Follow up in 4-6 weeks with DK   Merton Border, MD PCCM service Mobile [phone] Pager [phone] 01/14/2016

## 2016-01-16 ENCOUNTER — Telehealth: Payer: Self-pay | Admitting: Internal Medicine

## 2016-01-16 NOTE — Telephone Encounter (Signed)
LMTCB x 1 

## 2016-01-16 NOTE — Telephone Encounter (Signed)
Pt states she is having issues with her BIPAP machine. Please call.

## 2016-01-17 NOTE — Telephone Encounter (Signed)
Pt left message on my voice mail stating that she was having issues with [facility] and is requesting to change DME to [facility] for her o2 and BiPap. LMOAM for pt to return my call. Rhonda J Cobb

## 2016-01-21 ENCOUNTER — Telehealth: Payer: Self-pay | Admitting: Internal Medicine

## 2016-01-21 DIAGNOSIS — J9621 Acute and chronic respiratory failure with hypoxia: Secondary | ICD-10-CM

## 2016-01-21 NOTE — Telephone Encounter (Signed)
Routing to [place] triage pool as this is not a sick/urgent message.

## 2016-01-21 NOTE — Telephone Encounter (Signed)
Pt came by, states she needs a new order sent to [facility] for her BiPAP. 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 Please call.

## 2016-01-22 ENCOUNTER — Encounter: Payer: Medicare Other | Attending: Pulmonary Disease | Admitting: Respiratory Therapy

## 2016-01-22 VITALS — Ht 63.0 in | Wt 137.8 lb

## 2016-01-22 DIAGNOSIS — J449 Chronic obstructive pulmonary disease, unspecified: Secondary | ICD-10-CM

## 2016-01-22 DIAGNOSIS — J9621 Acute and chronic respiratory failure with hypoxia: Secondary | ICD-10-CM | POA: Diagnosis not present

## 2016-01-22 NOTE — Progress Notes (Signed)
Pulmonary Individual Treatment Plan  Patient Details  Name: [name] [name] MRN: [id number] Date of Birth: 1960/06/24 Referring Provider:    Initial Encounter Date:  Flowsheet Row Pulmonary Rehab from 01/22/2016 in [facility] Cardiac and Pulmonary Rehab  Date  01/22/16      Visit Diagnosis: COPD, severe ([place])  Patient's Home Medications on Admission:  Current Outpatient Prescriptions:    albuterol (PROAIR HFA) 108 (90 Base) MCG/ACT inhaler, INHALE 2 PUFFS BY MOUTH INTO THE LUNGS EVERY 6 HOURS AS NEEDED FOR WHEEZING OR SHORTNESS OF BREATH (Patient taking differently: Inhale 2 puffs into the lungs every 6 (six) hours as needed for wheezing or shortness of breath. ), Disp: 3 Inhaler, Rfl: 0   ALPRAZolam (XANAX) 1 MG tablet, Take 0.5 tablets (0.5 mg total) by mouth 3 (three) times daily as needed for anxiety., Disp: 30 tablet, Rfl: 5   aspirin 81 MG EC tablet, Take 1 tablet (81 mg total) by mouth daily., Disp: 30 tablet, Rfl: 1   cetirizine (ZYRTEC ALLERGY) 10 MG tablet, Take 1 tablet (10 mg total) by mouth daily., Disp: 90 tablet, Rfl: 3   fluticasone (FLONASE) 50 MCG/ACT nasal spray, Place 1 spray into both nostrils daily., Disp: 1 g, Rfl: 5   guaiFENesin (MUCINEX) 600 MG 12 hr tablet, Take 1 tablet (600 mg total) by mouth 2 (two) times daily as needed for cough or to loosen phlegm., Disp: 20 tablet, Rfl: 0   ipratropium-albuterol (DUONEB) 0.5-2.5 (3) MG/3ML SOLN, Take 3 mLs by nebulization every 4 (four) hours as needed (for wheezing/shortness of breath). (Patient taking differently: Take 3 mLs by nebulization every 4 (four) hours as needed. For wheezing/shortness of breath), Disp: 360 mL, Rfl: 2   mometasone-formoterol (DULERA) 200-5 MCG/ACT AERO, Inhale 2 puffs into the lungs 2 (two) times daily., Disp: 3 Inhaler, Rfl: 3   predniSONE (DELTASONE) 10 MG tablet, 40 mg daily for 2 days, 20 mg daily for 2 days, 10 mg daily for 3 days., Disp: 15 tablet, Rfl: 0   predniSONE (DELTASONE) 5  MG tablet, Take 1 tablet (5 mg total) by mouth daily with breakfast., Disp: 30 tablet, Rfl: 5   QUEtiapine (SEROQUEL) 50 MG tablet, Take 1 tablet (50 mg total) by mouth at bedtime., Disp: 30 tablet, Rfl: 0   tiotropium (SPIRIVA) 18 MCG inhalation capsule, Place 1 capsule (18 mcg total) into inhaler and inhale daily., Disp: 90 capsule, Rfl: 3  Past Medical History: Past Medical History:  Diagnosis Date   Breast cancer ([place])    Cancer ([place])    COPD (chronic obstructive pulmonary disease) ([place])     Tobacco Use: History  Smoking Status   Former Smoker   Packs/day: 0.50   Years: 40.00  Smokeless Tobacco   Never Used    Comment: quit smoking 09/26/15    Labs: Recent Review Flowsheet Data    Labs for ITP Cardiac and Pulmonary Rehab Latest Ref Rng & Units 05/06/2015 05/08/2015 10/31/2015 10/31/2015 10/31/2015   Cholestrol 0 - 200 mg/dL - - - - -   LDLCALC 0 - 100 mg/dL - - - - -   HDL 40 - 60 mg/dL - - - - -   Trlycerides 0 - 200 mg/dL - - - - -   Hemoglobin A1c 4.8 - 5.6 % - - 5.0 - -   PHART 7.350 - 7.450 7.31(L) 7.36 - - -   PCO2ART 32.0 - 48.0 mmHg 57(H) 66(H) - - -   HCO3 20.0 - 28.0 mmol/L 28.7(H) 37.3(H) -  36.1(H) 35.9(H)   O2SAT % 96.3 96.3 - 98.2 97.4       ADL UCSD:     Pulmonary Assessment Scores    Row Name 01/22/16 1606         ADL UCSD   ADL Phase Entry     SOB Score total 81     Rest 0     Walk 3     Stairs 5     Bath 4     Dress 3     Shop 4        Pulmonary Function Assessment:     Pulmonary Function Assessment - 01/22/16 1604      Pulmonary Function Tests   RV% 94 %   DLCO% 37 %     Initial Spirometry Results   FVC% 47 %   FEV1% 28 %   FEV1/FVC Ratio 48   Comments Test date      Post Bronchodilator Spirometry Results   FVC% 44 %   FEV1% 27 %   FEV1/FVC Ratio 49     Breath   Shortness of Breath Yes;Limiting activity;Fear of Shortness of Breath      Exercise Target Goals: Date: 01/22/16  Exercise Program Goal: Individual  exercise prescription set with THRR, safety & activity barriers. Participant demonstrates ability to understand and report RPE using BORG scale, to self-measure pulse accurately, and to acknowledge the importance of the exercise prescription.  Exercise Prescription Goal: Starting with aerobic activity 30 plus minutes a day, 3 days per week for initial exercise prescription. Provide home exercise prescription and guidelines that participant acknowledges understanding prior to discharge.  Activity Barriers & Risk Stratification:     Activity Barriers & Cardiac Risk Stratification - 01/22/16 1604      Activity Barriers & Cardiac Risk Stratification   Activity Barriers Shortness of Breath;Deconditioning;Muscular Weakness   Cardiac Risk Stratification Moderate      6 Minute Walk:     6 Minute Walk    Row Name 01/22/16 1546         6 Minute Walk   Distance 990 feet     Walk Time 6 minutes     # of Rest Breaks 0     MPH 1.88     METS 3.19     RPE 11     Perceived Dyspnea  3     VO2 Peak 11.18     Symptoms No     Resting HR 110 bpm     Resting BP 108/78     Max Ex. HR 127 bpm     Max Ex. BP 108/60       Interval HR   Baseline HR 110     1 Minute HR 113     2 Minute HR 123     3 Minute HR 127     4 Minute HR 126     5 Minute HR 125     6 Minute HR 124     Interval Heart Rate? Yes       Interval Oxygen   Interval Oxygen? Yes     Baseline Oxygen Saturation % 92 %     Baseline Liters of Oxygen 2 L     1 Minute Oxygen Saturation % 91 %     1 Minute Liters of Oxygen 2 L     2 Minute Oxygen Saturation % 94 %     2 Minute Liters of Oxygen 2 L  3 Minute Oxygen Saturation % 95 %     3 Minute Liters of Oxygen 2 L     4 Minute Oxygen Saturation % 95 %     4 Minute Liters of Oxygen 2 L     5 Minute Oxygen Saturation % 94 %     5 Minute Liters of Oxygen 2 L     6 Minute Oxygen Saturation % 93 %     6 Minute Liters of Oxygen 2 L     2 Minute Post Liters of Oxygen 2 L         Initial Exercise Prescription:     Initial Exercise Prescription - 01/22/16 1500      Date of Initial Exercise RX and Referring Provider   Date 01/22/16     Oxygen   Oxygen Continuous   Liters 2     Treadmill   MPH 1.8   Grade 0.5   Minutes 15     NuStep   Level 3   Minutes 15   METs 3     REL-XR   Level 2   Minutes 15   METs 3     Prescription Details   Frequency (times per week) 3   Duration Progress to 45 minutes of aerobic exercise without signs/symptoms of physical distress     Intensity   THRR 40-80% of Max Heartrate 131-152   Ratings of Perceived Exertion 11-13   Perceived Dyspnea 0-4     Progression   Progression Continue to progress workloads to maintain intensity without signs/symptoms of physical distress.     Resistance Training   Training Prescription Yes   Weight 3      Perform Capillary Blood Glucose checks as needed.  Exercise Prescription Changes:   Exercise Comments:   Discharge Exercise Prescription (Final Exercise Prescription Changes):    Nutrition:  Target Goals: Understanding of nutrition guidelines, daily intake of sodium 1500mg , cholesterol 200mg , calories 30% from fat and 7% or less from saturated fats, daily to have 5 or more servings of fruits and vegetables.  Biometrics:     Pre Biometrics - 01/22/16 1546      Pre Biometrics   Height 5\' 3"  (1.6 m)   Weight 137 lb 12.8 oz (62.5 kg)   Waist Circumference 31 inches   Hip Circumference 38.5 inches   Waist to Hip Ratio 0.81 %   BMI (Calculated) 24.5       Nutrition Therapy Plan and Nutrition Goals:   Nutrition Discharge: Rate Your Plate Scores:   Psychosocial: Target Goals: Acknowledge presence or absence of depression, maximize coping skills, provide positive support system. Participant is able to verbalize types and ability to use techniques and skills needed for reducing stress and depression.  Initial Review & Psychosocial Screening:      Initial Psych Review & Screening - 01/22/16 [place]? Yes   Comments Ms Mcgaha has good family support from her children. She has depression, but not from her COPD and did not offer an explanation. She does go to a support group on Wednesday's. She is very excited about [facility] and is ready "to make a change".     Barriers   Psychosocial barriers to participate in program The patient should benefit from training in stress management and relaxation.     Screening Interventions   Interventions Encouraged to exercise;Program counselor consult      Quality of Life Scores:  Quality of Life - 01/22/16 1614      Quality of Life Scores   Health/Function Pre 21 %   Socioeconomic Pre 21 %   Psych/Spiritual Pre 21 %   Family Pre 21 %   GLOBAL Pre 21 %      PHQ-9: Recent Review Flowsheet Data    Depression screen [facility] 2/9 01/22/2016   Decreased Interest 2   Down, Depressed, Hopeless 2   PHQ - 2 Score 4   Altered sleeping 3   Tired, decreased energy 3   Change in appetite 0   Feeling bad or failure about yourself  3   Trouble concentrating 1   Moving slowly or fidgety/restless 1   Suicidal thoughts 0   PHQ-9 Score 15   Difficult doing work/chores Very difficult      Psychosocial Evaluation and Intervention:   Psychosocial Re-Evaluation:  Education: Education Goals: Education classes will be provided on a weekly basis, covering required topics. Participant will state understanding/return demonstration of topics presented.  Learning Barriers/Preferences:     Learning Barriers/Preferences - 01/22/16 1604      Learning Barriers/Preferences   Learning Barriers None   Learning Preferences None      Education Topics: Initial Evaluation Education: - Verbal, written and demonstration of respiratory meds, RPE/PD scales, oximetry and breathing techniques. Instruction on use of nebulizers and MDIs: cleaning and proper use,  rinsing mouth with steroid doses and importance of monitoring MDI activations. Flowsheet Row Pulmonary Rehab from 01/22/2016 in [facility] Cardiac and Pulmonary Rehab  Date  01/22/16  Educator  LB  Instruction Review Code  2- meets goals/outcomes      General Nutrition Guidelines/Fats and Fiber: -Group instruction provided by verbal, written material, models and posters to present the general guidelines for heart healthy nutrition. Gives an explanation and review of dietary fats and fiber.   Controlling Sodium/Reading Food Labels: -Group verbal and written material supporting the discussion of sodium use in heart healthy nutrition. Review and explanation with models, verbal and written materials for utilization of the food label.   Exercise Physiology & Risk Factors: - Group verbal and written instruction with models to review the exercise physiology of the cardiovascular system and associated critical values. Details cardiovascular disease risk factors and the goals associated with each risk factor.   Aerobic Exercise & Resistance Training: - Gives group verbal and written discussion on the health impact of inactivity. On the components of aerobic and resistive training programs and the benefits of this training and how to safely progress through these programs.   Flexibility, Balance, General Exercise Guidelines: - Provides group verbal and written instruction on the benefits of flexibility and balance training programs. Provides general exercise guidelines with specific guidelines to those with heart or lung disease. Demonstration and skill practice provided.   Stress Management: - Provides group verbal and written instruction about the health risks of elevated stress, cause of high stress, and healthy ways to reduce stress.   Depression: - Provides group verbal and written instruction on the correlation between heart/lung disease and depressed mood, treatment options, and the stigmas  associated with seeking treatment.   Exercise & Equipment Safety: - Individual verbal instruction and demonstration of equipment use and safety with use of the equipment.   Infection Prevention: - Provides verbal and written material to individual with discussion of infection control including proper hand washing and proper equipment cleaning during exercise session.   Falls Prevention: - Provides verbal and written material to individual with discussion  of falls prevention and safety.   Diabetes: - Individual verbal and written instruction to review signs/symptoms of diabetes, desired ranges of glucose level fasting, after meals and with exercise. Advice that pre and post exercise glucose checks will be done for 3 sessions at entry of program.   Chronic Lung Diseases: - Group verbal and written instruction to review new updates, new respiratory medications, new advancements in procedures and treatments. Provide informative websites and "800" numbers of self-education.   Lung Procedures: - Group verbal and written instruction to describe testing methods done to diagnose lung disease. Review the outcome of test results. Describe the treatment choices: Pulmonary Function Tests, ABGs and oximetry.   Energy Conservation: - Provide group verbal and written instruction for methods to conserve energy, plan and organize activities. Instruct on pacing techniques, use of adaptive equipment and posture/positioning to relieve shortness of breath.   Triggers: - Group verbal and written instruction to review types of environmental controls: home humidity, furnaces, filters, dust mite/pet prevention, HEPA vacuums. To discuss weather changes, air quality and the benefits of nasal washing.   Exacerbations: - Group verbal and written instruction to provide: warning signs, infection symptoms, calling MD promptly, preventive modes, and value of vaccinations. Review: effective airway clearance,  coughing and/or vibration techniques. Create an Sports administrator.   Oxygen: - Individual and group verbal and written instruction on oxygen therapy. Includes supplement oxygen, available portable oxygen systems, continuous and intermittent flow rates, oxygen safety, concentrators, and Medicare reimbursement for oxygen. Flowsheet Row Pulmonary Rehab from 01/22/2016 in [facility] Cardiac and Pulmonary Rehab  Date  01/22/16  Educator  LB  Instruction Review Code  2- meets goals/outcomes      Respiratory Medications: - Group verbal and written instruction to review medications for lung disease. Drug class, frequency, complications, importance of spacers, rinsing mouth after steroid MDI's, and proper cleaning methods for nebulizers. Flowsheet Row Pulmonary Rehab from 01/22/2016 in [facility] Cardiac and Pulmonary Rehab  Date  01/22/16  Educator  LB  Instruction Review Code  2- meets goals/outcomes      AED/CPR: - Group verbal and written instruction with the use of models to demonstrate the basic use of the AED with the basic ABC's of resuscitation.   Breathing Retraining: - Provides individuals verbal and written instruction on purpose, frequency, and proper technique of diaphragmatic breathing and pursed-lipped breathing. Applies individual practice skills. Flowsheet Row Pulmonary Rehab from 01/22/2016 in [facility] Cardiac and Pulmonary Rehab  Date  01/22/16  Educator  LB  Instruction Review Code  2- meets goals/outcomes      Anatomy and Physiology of the Lungs: - Group verbal and written instruction with the use of models to provide basic lung anatomy and physiology related to function, structure and complications of lung disease.   Heart Failure: - Group verbal and written instruction on the basics of heart failure: signs/symptoms, treatments, explanation of ejection fraction, enlarged heart and cardiomyopathy.   Sleep Apnea: - Individual verbal and written instruction to review Obstructive Sleep  Apnea. Review of risk factors, methods for diagnosing and types of masks and machines for OSA.   Anxiety: - Provides group, verbal and written instruction on the correlation between heart/lung disease and anxiety, treatment options, and management of anxiety.   Relaxation: - Provides group, verbal and written instruction about the benefits of relaxation for patients with heart/lung disease. Also provides patients with examples of relaxation techniques.   Knowledge Questionnaire Score:     Knowledge Questionnaire Score - 01/22/16 1604  Knowledge Questionnaire Score   Pre Score 7/10       Core Components/Risk Factors/Patient Goals at Admission:     Personal Goals and Risk Factors at Admission - 01/22/16 1608      Core Components/Risk Factors/Patient Goals on Admission    Weight Management Yes  Plans to meet with the dietitian; has gained 17lbs - steroids have increased her appetite   Intervention Weight Management: Develop a combined nutrition and exercise program designed to reach desired caloric intake, while maintaining appropriate intake of nutrient and fiber, sodium and fats, and appropriate energy expenditure required for the weight goal.;Weight Management: Provide education and appropriate resources to help participant work on and attain dietary goals.;Weight Management/Obesity: Establish reasonable short term and long term weight goals.   Admit Weight 137 lb 12.8 oz (62.5 kg)   Goal Weight: Short Term 132 lb (59.9 kg)   Goal Weight: Long Term 120 lb (54.4 kg)   Expected Outcomes Short Term: Continue to assess and modify interventions until short term weight is achieved;Long Term: Adherence to nutrition and physical activity/exercise program aimed toward attainment of established weight goal;Weight Maintenance: Understanding of the daily nutrition guidelines, which includes 25-35% calories from fat, 7% or less cal from saturated fats, less than 200mg  cholesterol, less than  1.5gm of sodium, & 5 or more servings of fruits and vegetables daily;Weight Loss: Understanding of general recommendations for a balanced deficit meal plan, which promotes 1-2 lb weight loss per week and includes a negative energy balance of [phone] kcal/d;Understanding recommendations for meals to include 15-35% energy as protein, 25-35% energy from fat, 35-60% energy from carbohydrates, less than 200mg  of dietary cholesterol, 20-35 gm of total fiber daily;Understanding of distribution of calorie intake throughout the day with the consumption of 4-5 meals/snacks   Sedentary Yes  Preparing for possible Lung Transplant surgery   Intervention Provide advice, education, support and counseling about physical activity/exercise needs.;Develop an individualized exercise prescription for aerobic and resistive training based on initial evaluation findings, risk stratification, comorbidities and participant's personal goals.   Expected Outcomes Achievement of increased cardiorespiratory fitness and enhanced flexibility, muscular endurance and strength shown through measurements of functional capacity and personal statement of participant.   Increase Strength and Stamina Yes   Intervention Provide advice, education, support and counseling about physical activity/exercise needs.;Develop an individualized exercise prescription for aerobic and resistive training based on initial evaluation findings, risk stratification, comorbidities and participant's personal goals.   Expected Outcomes Achievement of increased cardiorespiratory fitness and enhanced flexibility, muscular endurance and strength shown through measurements of functional capacity and personal statement of participant.   Improve shortness of breath with ADL's Yes   Intervention Provide education, individualized exercise plan and daily activity instruction to help decrease symptoms of SOB with activities of daily living.   Expected Outcomes Short Term:  Achieves a reduction of symptoms when performing activities of daily living.   Develop more efficient breathing techniques such as purse lipped breathing and diaphragmatic breathing; and practicing self-pacing with activity Yes   Intervention Provide education, demonstration and support about specific breathing techniuqes utilized for more efficient breathing. Include techniques such as pursed lipped breathing, diaphragmatic breathing and self-pacing activity.   Expected Outcomes Short Term: Participant will be able to demonstrate and use breathing techniques as needed throughout daily activities.   Increase knowledge of respiratory medications and ability to use respiratory devices properly  Yes  Duoneb SVN, ProAir with spacer, Dulero, Spiriva; 2l/m oxygen; BIPAP   Intervention Provide education and demonstration as  needed of appropriate use of medications, inhalers, and oxygen therapy.   Expected Outcomes Short Term: Achieves understanding of medications use. Understands that oxygen is a medication prescribed by physician. Demonstrates appropriate use of inhaler and oxygen therapy.      Core Components/Risk Factors/Patient Goals Review:    Core Components/Risk Factors/Patient Goals at Discharge (Final Review):    ITP Comments:   Comments: Ms [place] plans to start [facility] on 01/30/2016 and attend 3 days/week.

## 2016-01-22 NOTE — Telephone Encounter (Signed)
[id number] X1 

## 2016-01-22 NOTE — Patient Instructions (Signed)
Patient Instructions  Patient Details  Name: [name] [name] MRN: [id number] Date of Birth: 06-16-1960 Referring Provider:  Wilhelmina Mcardle, MD  Below are the personal goals you chose as well as exercise and nutrition goals. Our goal is to help you keep on track towards obtaining and maintaining your goals. We will be discussing your progress on these goals with you throughout the program.  Initial Exercise Prescription:     Initial Exercise Prescription - 01/22/16 1500      Date of Initial Exercise RX and Referring Provider   Date 01/22/16     Oxygen   Oxygen Continuous   Liters 2     Treadmill   MPH 1.8   Grade 0.5   Minutes 15     NuStep   Level 3   Minutes 15   METs 3     REL-XR   Level 2   Minutes 15   METs 3     Prescription Details   Frequency (times per week) 3   Duration Progress to 45 minutes of aerobic exercise without signs/symptoms of physical distress     Intensity   THRR 40-80% of Max Heartrate 131-152   Ratings of Perceived Exertion 11-13   Perceived Dyspnea 0-4     Progression   Progression Continue to progress workloads to maintain intensity without signs/symptoms of physical distress.     Resistance Training   Training Prescription Yes   Weight 3      Exercise Goals: Frequency: Be able to perform aerobic exercise three times per week working toward 3-5 days per week.  Intensity: Work with a perceived exertion of 11 (fairly light) - 15 (hard) as tolerated. Follow your new exercise prescription and watch for changes in prescription as you progress with the program. Changes will be reviewed with you when they are made.  Duration: You should be able to do 30 minutes of continuous aerobic exercise in addition to a 5 minute warm-up and a 5 minute cool-down routine.  Nutrition Goals: Your personal nutrition goals will be established when you do your nutrition analysis with the dietician.  The following are nutrition guidelines to  follow: Cholesterol < 200mg /day Sodium < 1500mg /day Fiber: Women over 50 yrs - 21 grams per day  Personal Goals:     Personal Goals and Risk Factors at Admission - 01/22/16 1608      Core Components/Risk Factors/Patient Goals on Admission    Weight Management Yes  Plans to meet with the dietitian; has gained 17lbs - steroids have increased her appetite   Intervention Weight Management: Develop a combined nutrition and exercise program designed to reach desired caloric intake, while maintaining appropriate intake of nutrient and fiber, sodium and fats, and appropriate energy expenditure required for the weight goal.;Weight Management: Provide education and appropriate resources to help participant work on and attain dietary goals.;Weight Management/Obesity: Establish reasonable short term and long term weight goals.   Admit Weight 137 lb 12.8 oz (62.5 kg)   Goal Weight: Short Term 132 lb (59.9 kg)   Goal Weight: Long Term 120 lb (54.4 kg)   Expected Outcomes Short Term: Continue to assess and modify interventions until short term weight is achieved;Long Term: Adherence to nutrition and physical activity/exercise program aimed toward attainment of established weight goal;Weight Maintenance: Understanding of the daily nutrition guidelines, which includes 25-35% calories from fat, 7% or less cal from saturated fats, less than 200mg  cholesterol, less than 1.5gm of sodium, & 5 or more servings of fruits  and vegetables daily;Weight Loss: Understanding of general recommendations for a balanced deficit meal plan, which promotes 1-2 lb weight loss per week and includes a negative energy balance of [phone] kcal/d;Understanding recommendations for meals to include 15-35% energy as protein, 25-35% energy from fat, 35-60% energy from carbohydrates, less than 200mg  of dietary cholesterol, 20-35 gm of total fiber daily;Understanding of distribution of calorie intake throughout the day with the consumption of 4-5  meals/snacks   Sedentary Yes  Preparing for possible Lung Transplant surgery   Intervention Provide advice, education, support and counseling about physical activity/exercise needs.;Develop an individualized exercise prescription for aerobic and resistive training based on initial evaluation findings, risk stratification, comorbidities and participant's personal goals.   Expected Outcomes Achievement of increased cardiorespiratory fitness and enhanced flexibility, muscular endurance and strength shown through measurements of functional capacity and personal statement of participant.   Increase Strength and Stamina Yes   Intervention Provide advice, education, support and counseling about physical activity/exercise needs.;Develop an individualized exercise prescription for aerobic and resistive training based on initial evaluation findings, risk stratification, comorbidities and participant's personal goals.   Expected Outcomes Achievement of increased cardiorespiratory fitness and enhanced flexibility, muscular endurance and strength shown through measurements of functional capacity and personal statement of participant.   Improve shortness of breath with ADL's Yes   Intervention Provide education, individualized exercise plan and daily activity instruction to help decrease symptoms of SOB with activities of daily living.   Expected Outcomes Short Term: Achieves a reduction of symptoms when performing activities of daily living.   Develop more efficient breathing techniques such as purse lipped breathing and diaphragmatic breathing; and practicing self-pacing with activity Yes   Intervention Provide education, demonstration and support about specific breathing techniuqes utilized for more efficient breathing. Include techniques such as pursed lipped breathing, diaphragmatic breathing and self-pacing activity.   Expected Outcomes Short Term: Participant will be able to demonstrate and use breathing  techniques as needed throughout daily activities.   Increase knowledge of respiratory medications and ability to use respiratory devices properly  Yes  Duoneb SVN, ProAir with spacer, Dulero, Spiriva; 2l/m oxygen; BIPAP   Intervention Provide education and demonstration as needed of appropriate use of medications, inhalers, and oxygen therapy.   Expected Outcomes Short Term: Achieves understanding of medications use. Understands that oxygen is a medication prescribed by physician. Demonstrates appropriate use of inhaler and oxygen therapy.      Tobacco Use Initial Evaluation: History  Smoking Status   Former Smoker   Packs/day: 0.50   Years: 40.00  Smokeless Tobacco   Never Used    Comment: quit smoking 09/26/15    Copy of goals given to participant.

## 2016-01-24 ENCOUNTER — Encounter: Payer: Self-pay | Admitting: Internal Medicine

## 2016-01-24 ENCOUNTER — Telehealth: Payer: Self-pay | Admitting: Internal Medicine

## 2016-01-24 NOTE — Telephone Encounter (Signed)
This encounter was created in error - please disregard.

## 2016-01-24 NOTE — Telephone Encounter (Signed)
[employer] calling stating pt is not covered for receiving a cpap machine with [facility] They need [place] to send an order to [place]  Please send it new order to them

## 2016-01-24 NOTE — Telephone Encounter (Signed)
Did the insurance company leave a phone number??

## 2016-01-25 NOTE — Telephone Encounter (Deleted)
They did not,they said we would just need to contact the patient back.

## 2016-01-29 NOTE — Telephone Encounter (Signed)
Spoke with pt, who is requesting her bipap order be sent to [facility]. Pt states she is currently with [facility] and has a large bill with them, as [facility] was not filing under her insurance. Pt states she is not currently using her bipap, and would like this order to be placed ASAP. I spoke with Apolonio Schneiders with [facility], who was able to give me pt's current settings. Order has been placed for [facility]. Pt aware and voiced her understanding. Nothing further needed

## 2016-01-29 NOTE — Telephone Encounter (Signed)
Per pt chart, order has already been placed today for pt's bipap to be received through [facility] instead of [place] by [employer].  Nothing further needed.

## 2016-01-29 NOTE — Telephone Encounter (Signed)
They did not,they said we would just need to contact patient

## 2016-01-30 ENCOUNTER — Encounter: Payer: Medicare Other | Admitting: *Deleted

## 2016-01-30 DIAGNOSIS — J449 Chronic obstructive pulmonary disease, unspecified: Secondary | ICD-10-CM

## 2016-01-30 DIAGNOSIS — J9621 Acute and chronic respiratory failure with hypoxia: Secondary | ICD-10-CM | POA: Diagnosis not present

## 2016-01-30 NOTE — Progress Notes (Signed)
Daily Session Note  Patient Details  Name: [name] [name] MRN: [id number] Date of Birth: 16-Jun-1960 Referring Provider:    Encounter Date: 01/30/2016  Check In:     Session Check In - 01/30/16 1211      Check-In   Location [facility]   Staff Present Carson Myrtle, BS, RRT, Respiratory Lennie Hummer, MA, ACSM RCEP, Exercise Physiologist;Chantavia Bazzle RN BSN   Supervising physician immediately available to respond to emergencies [facility] immediately available ER MD   Physician(s) Drs. Archie Balboa and [employer]   Medication changes reported     No   Fall or balance concerns reported    No   Warm-up and Cool-down Performed as group-led Location manager Performed Yes   VAD Patient? No     Pain Assessment   Currently in Pain? No/denies   Multiple Pain Sites No         Goals Met:  Proper associated with RPD/PD & O2 Sat Exercise tolerated well Queuing for purse lip breathing No report of cardiac concerns or symptoms Strength training completed today  Goals Unmet:  Not Applicable  Comments: First full day of exercise!  Patient was oriented to gym and equipment including functions, settings, policies, and procedures.  Patient's individual exercise prescription and treatment plan were reviewed.  All starting workloads were established based on the results of the 6 minute walk test done at initial orientation visit.  The plan for exercise progression was also introduced and progression will be customized based on patient's performance and goals.    Dr. Emily Filbert is Medical Director for [facility] and [facility].

## 2016-02-01 ENCOUNTER — Encounter: Payer: Medicare Other | Admitting: *Deleted

## 2016-02-01 DIAGNOSIS — J9621 Acute and chronic respiratory failure with hypoxia: Secondary | ICD-10-CM | POA: Diagnosis not present

## 2016-02-01 DIAGNOSIS — J449 Chronic obstructive pulmonary disease, unspecified: Secondary | ICD-10-CM

## 2016-02-01 NOTE — Progress Notes (Signed)
Daily Session Note  Patient Details  Name: [name] [name] MRN: [id number] Date of Birth: Jun 02, 1960 Referring Provider:    Encounter Date: 02/01/2016  Check In:     Session Check In - 02/01/16 1104      Check-In   Location [facility]   Staff Present Gerlene Burdock, RN, Alex Gardener, DPT, CEEA;Patricia Surles RN BSN   Supervising physician immediately available to respond to emergencies [facility] immediately available ER MD   Physician(s) Dr. Corky Downs and Dr. Marcelene Butte   Medication changes reported     No   Fall or balance concerns reported    No   Warm-up and Cool-down Performed as group-led instruction   Resistance Training Performed Yes   VAD Patient? No     Pain Assessment   Currently in Pain? No/denies         Goals Met:  Proper associated with RPD/PD & O2 Sat Exercise tolerated well  Goals Unmet:  Not Applicable  Comments:     Dr. Emily Filbert is Medical Director for [place] and [facility].

## 2016-02-06 ENCOUNTER — Encounter: Payer: Medicare Other | Attending: Pulmonary Disease

## 2016-02-06 DIAGNOSIS — J9621 Acute and chronic respiratory failure with hypoxia: Secondary | ICD-10-CM | POA: Insufficient documentation

## 2016-02-07 ENCOUNTER — Ambulatory Visit: Payer: Medicare Other | Admitting: Internal Medicine

## 2016-02-11 ENCOUNTER — Encounter: Payer: Medicare Other | Admitting: *Deleted

## 2016-02-11 DIAGNOSIS — J449 Chronic obstructive pulmonary disease, unspecified: Secondary | ICD-10-CM

## 2016-02-11 DIAGNOSIS — J9621 Acute and chronic respiratory failure with hypoxia: Secondary | ICD-10-CM | POA: Diagnosis not present

## 2016-02-11 NOTE — Progress Notes (Signed)
Daily Session Note  Patient Details  Name: [name] [name] MRN: [id number] Date of Birth: 12/03/1960 Referring Provider:    Encounter Date: 02/11/2016  Check In:     Session Check In - 02/11/16 1016      Check-In   Location [facility]   Staff Present Amanda Sommer, BA, ACSM CEP, Exercise Physiologist;Laureen Brown, BS, RRT, Respiratory Therapist; , BS, ACSM CEP, Exercise Physiologist   Supervising physician immediately available to respond to emergencies [facility] immediately available ER MD   Physician(s) Drs. McShane and [place]   Medication changes reported     No   Fall or balance concerns reported    No   Warm-up and Cool-down Performed on first and last piece of equipment   Resistance Training Performed Yes   VAD Patient? No     Pain Assessment   Currently in Pain? No/denies   Multiple Pain Sites No         Goals Met:  Proper associated with RPD/PD & O2 Sat [place] with exercise equipment Exercise tolerated well Personal goals reviewed Strength training completed today  Goals Unmet:  Not Applicable  Comments: Pt able to follow exercise prescription today without complaint.  Will continue to monitor for progression.    Dr. Mark Miller is Medical Director for [facility] and [facility]. 

## 2016-02-12 ENCOUNTER — Ambulatory Visit (INDEPENDENT_AMBULATORY_CARE_PROVIDER_SITE_OTHER): Payer: Medicare Other | Admitting: Internal Medicine

## 2016-02-12 ENCOUNTER — Encounter: Payer: Self-pay | Admitting: Internal Medicine

## 2016-02-12 VITALS — BP 118/78 | HR 111 | Wt 139.0 lb

## 2016-02-12 DIAGNOSIS — G4719 Other hypersomnia: Secondary | ICD-10-CM

## 2016-02-12 MED ORDER — PREDNISONE 5 MG PO TABS: 5.0000 mg | ORAL_TABLET | Freq: Every day | ORAL | 5 refills | Status: DC

## 2016-02-12 NOTE — Progress Notes (Signed)
[facility] Consultation     Date: 02/12/2016,   MRN# [id number] FARIHA IBARRA 1961/01/20 Code Status:  Code Status History    Date Active Date Inactive Code Status Order ID Comments User Context   05/06/2015  5:11 PM 05/14/2015  5:49 PM Full Code [id number]  Wilhelmina Mcardle, MD ED   12/08/2014  9:33 PM 12/10/2014  3:11 PM Full Code [id number]  Theodoro Grist, MD Inpatient   07/15/2014  9:24 PM 07/17/2014  2:06 PM Full Code [id number]  Idelle Crouch, MD Inpatient     Hosp day:@LENGTHOFSTAYDAYS @ Referring MD: @ATDPROV @     PCP:      AdmissionWeight: 139 lb (63 kg)                 CurrentWeight: 139 lb (63 kg) [place] L Scarsella is a 56 y.o. old female seen in consultation for copd and hospital follow up.     CHIEF COMPLAINT:   Follow up SOB,DOE   HISTORY OF PRESENT ILLNESS   Follow up for COPD  Patient on biPAP, on oxygen with exertion and at night-patient benefits from this therapy and is preventing recurrent admissions but not wearing for 3 weeks needs new mask and repeat sleep study  Patient remains SOB, DOE seems to be at her baseline She DID not tolerate azithromycin and has restarted oral prednisone On dulera and spiriva and albuterol as needed No signs infection at this time  PFT 06/2015 Ratio 48% Fev1 28% DLCO 37%  6MWT abnormal o2 sat below 87%   Her current pulmonary regimen is as follows: Dulera 200-5, 2 actuations BID Spiriva HH - once a day Prednisone 10 mg daily Albuterol MDI PRN Duoneb PRN BiPAP @ HS   Stopped smoking 1 month ago   Current Medication:   Current Outpatient Prescriptions:  .  albuterol (PROAIR HFA) 108 (90 Base) MCG/ACT inhaler, INHALE 2 PUFFS BY MOUTH INTO THE LUNGS EVERY 6 HOURS AS NEEDED FOR WHEEZING OR SHORTNESS OF BREATH (Patient taking differently: Inhale 2 puffs into the lungs every 6 (six) hours as needed for wheezing or shortness of breath. ), Disp: 3 Inhaler, Rfl: 0 .  ALPRAZolam (XANAX) 1 MG  tablet, Take 0.5 tablets (0.5 mg total) by mouth 3 (three) times daily as needed for anxiety., Disp: 30 tablet, Rfl: 5 .  aspirin 81 MG EC tablet, Take 1 tablet (81 mg total) by mouth daily., Disp: 30 tablet, Rfl: 1 .  cetirizine (ZYRTEC ALLERGY) 10 MG tablet, Take 1 tablet (10 mg total) by mouth daily., Disp: 90 tablet, Rfl: 3 .  fluticasone (FLONASE) 50 MCG/ACT nasal spray, Place 1 spray into both nostrils daily., Disp: 1 g, Rfl: 5 .  guaiFENesin (MUCINEX) 600 MG 12 hr tablet, Take 1 tablet (600 mg total) by mouth 2 (two) times daily as needed for cough or to loosen phlegm., Disp: 20 tablet, Rfl: 0 .  ipratropium-albuterol (DUONEB) 0.5-2.5 (3) MG/3ML SOLN, Take 3 mLs by nebulization every 4 (four) hours as needed (for wheezing/shortness of breath). (Patient taking differently: Take 3 mLs by nebulization every 4 (four) hours as needed. For wheezing/shortness of breath), Disp: 360 mL, Rfl: 2 .  mometasone-formoterol (DULERA) 200-5 MCG/ACT AERO, Inhale 2 puffs into the lungs 2 (two) times daily., Disp: 3 Inhaler, Rfl: 3 .  predniSONE (DELTASONE) 5 MG tablet, Take 1 tablet (5 mg total) by mouth daily with breakfast., Disp: 30 tablet, Rfl: 5 .  QUEtiapine (SEROQUEL) 50 MG tablet, Take 1 tablet (50 mg total)  by mouth at bedtime., Disp: 30 tablet, Rfl: 0 .  tiotropium (SPIRIVA) 18 MCG inhalation capsule, Place 1 capsule (18 mcg total) into inhaler and inhale daily., Disp: 90 capsule, Rfl: 3     ALLERGIES   Zithromax [azithromycin] and Penicillins     REVIEW OF SYSTEMS   Review of Systems  Constitutional: Negative for chills, fever, malaise/fatigue and weight loss.  HENT: Negative for congestion.   Respiratory: Positive for shortness of breath. Negative for cough, hemoptysis, sputum production and wheezing.   Cardiovascular: Negative for chest pain, palpitations, orthopnea and leg swelling.  Gastrointestinal: Negative for heartburn, nausea and vomiting.  Psychiatric/Behavioral: Negative for  depression. The patient is not nervous/anxious.   All other systems reviewed and are negative.    VS: BP 118/78 (BP Location: Left Arm, Cuff Size: Normal)   Pulse (!) 111   Wt 139 lb (63 kg)   SpO2 93%   BMI 24.62 kg/m      PHYSICAL EXAM   Physical Exam  Constitutional: She is oriented to person, place, and time. No distress.  Cardiovascular: Normal rate, regular rhythm and normal heart sounds.   No murmur heard. Pulmonary/Chest: Effort normal and breath sounds normal. No stridor. No respiratory distress. She has no wheezes. She has no rales.  Musculoskeletal: Normal range of motion. She exhibits no edema.  Neurological: She is alert and oriented to person, place, and time.  Skin: Skin is warm. She is not diaphoretic.  Psychiatric: She has a normal mood and affect.       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 She needs repeat sleep study to assess mask needs  Patient needs/requires noninvasive ventilation to survive   1.re-assess biPAP at night with oxygen therapy 2 L [place] also oxygen with exertion 2.prednisone 10 mg daily prevent COPD exacerbations 3.continue dulera and spiriva 4.continue albuterol as needed needed 5.she has stopped smoking one month ago 6.treatment for allergic rhinitis-flonase and allegra  7.[facility] program 8.may consider to lung transplant center if she continue to adhere to smoking cessation   Follow up in 3 months   The Patient requires high complexity decision making for assessment and support, frequent evaluation and titration of therapies, application of advanced monitoring technologies and extensive interpretation of multiple databases.   Patient are  satisfied with Plan of action and management. All questions answered   [name], M.D.  [facility]  Medical Director [place] Director [facility] Cardio-Pulmonary Department

## 2016-02-12 NOTE — Patient Instructions (Signed)
CONGRATULATIONS!!! You STOPPED SMOKING!!!!  Continue inhalers as prescribed Use Oxygen as needed Sleep study to be performed

## 2016-02-13 DIAGNOSIS — J449 Chronic obstructive pulmonary disease, unspecified: Secondary | ICD-10-CM

## 2016-02-13 NOTE — Progress Notes (Signed)
Daily Session Note  Patient Details  Name: [name] [name] MRN: [id number] Date of Birth: February 04, 1960 Referring Provider:    Encounter Date: 02/13/2016  Check In:     Session Check In - 02/13/16 1138      Check-In   Location [facility]   Staff Present Carson Myrtle, BS, RRT, Respiratory Lennie Hummer, MA, ACSM RCEP, Exercise Physiologist;Kimari Coudriet Oletta Darter, BA, ACSM CEP, Exercise Physiologist   Supervising physician immediately available to respond to emergencies [facility] immediately available ER MD   Physician(s) Darl Householder and Alfred Levins   Medication changes reported     No   Fall or balance concerns reported    No   Warm-up and Cool-down Performed as group-led instruction   Resistance Training Performed Yes   VAD Patient? No     Pain Assessment   Currently in Pain? No/denies   Multiple Pain Sites No         Goals Met:  Proper associated with RPD/PD & O2 Sat Independence with exercise equipment Exercise tolerated well Strength training completed today  Goals Unmet:  Not Applicable  Comments: Pt able to follow exercise prescription today without complaint.  Will continue to monitor for progression.    Dr. Emily Filbert is Medical Director for [place] and [facility].

## 2016-02-15 ENCOUNTER — Telehealth: Payer: Self-pay | Admitting: *Deleted

## 2016-02-15 ENCOUNTER — Encounter: Payer: Self-pay | Admitting: *Deleted

## 2016-02-15 NOTE — Telephone Encounter (Signed)
[name] [name] called and said she is sorry that she can not exercise today in Pulmonary Rehab since she has to be out with a family emergency.

## 2016-02-18 ENCOUNTER — Encounter: Payer: Self-pay | Admitting: Respiratory Therapy

## 2016-02-18 NOTE — Progress Notes (Signed)
Pulmonary Individual Treatment Plan  Patient Details  Name: [name] [name] MRN: [id number] Date of Birth: 03/21/1960 Referring Provider:    Initial Encounter Date:  Flowsheet Row Pulmonary Rehab from 01/22/2016 in [facility] Cardiac and Pulmonary Rehab  Date  01/22/16      Visit Diagnosis: No diagnosis found.  Patient's Home Medications on Admission:  Current Outpatient Prescriptions:    albuterol (PROAIR HFA) 108 (90 Base) MCG/ACT inhaler, INHALE 2 PUFFS BY MOUTH INTO THE LUNGS EVERY 6 HOURS AS NEEDED FOR WHEEZING OR SHORTNESS OF BREATH (Patient taking differently: Inhale 2 puffs into the lungs every 6 (six) hours as needed for wheezing or shortness of breath. ), Disp: 3 Inhaler, Rfl: 0   ALPRAZolam (XANAX) 1 MG tablet, Take 0.5 tablets (0.5 mg total) by mouth 3 (three) times daily as needed for anxiety., Disp: 30 tablet, Rfl: 5   aspirin 81 MG EC tablet, Take 1 tablet (81 mg total) by mouth daily., Disp: 30 tablet, Rfl: 1   cetirizine (ZYRTEC ALLERGY) 10 MG tablet, Take 1 tablet (10 mg total) by mouth daily., Disp: 90 tablet, Rfl: 3   fluticasone (FLONASE) 50 MCG/ACT nasal spray, Place 1 spray into both nostrils daily., Disp: 1 g, Rfl: 5   guaiFENesin (MUCINEX) 600 MG 12 hr tablet, Take 1 tablet (600 mg total) by mouth 2 (two) times daily as needed for cough or to loosen phlegm., Disp: 20 tablet, Rfl: 0   ipratropium-albuterol (DUONEB) 0.5-2.5 (3) MG/3ML SOLN, Take 3 mLs by nebulization every 4 (four) hours as needed (for wheezing/shortness of breath). (Patient taking differently: Take 3 mLs by nebulization every 4 (four) hours as needed. For wheezing/shortness of breath), Disp: 360 mL, Rfl: 2   mometasone-formoterol (DULERA) 200-5 MCG/ACT AERO, Inhale 2 puffs into the lungs 2 (two) times daily., Disp: 3 Inhaler, Rfl: 3   predniSONE (DELTASONE) 5 MG tablet, Take 1 tablet (5 mg total) by mouth daily with breakfast., Disp: 30 tablet, Rfl: 5   QUEtiapine (SEROQUEL) 50 MG tablet,  Take 1 tablet (50 mg total) by mouth at bedtime., Disp: 30 tablet, Rfl: 0   tiotropium (SPIRIVA) 18 MCG inhalation capsule, Place 1 capsule (18 mcg total) into inhaler and inhale daily., Disp: 90 capsule, Rfl: 3  Past Medical History: Past Medical History:  Diagnosis Date   Breast cancer ([place])    Cancer ([place])    COPD (chronic obstructive pulmonary disease) ([place])     Tobacco Use: History  Smoking Status   Former Smoker   Packs/day: 0.50   Years: 40.00  Smokeless Tobacco   Never Used    Comment: quit smoking 09/26/15    Labs: Recent Review Flowsheet Data    Labs for ITP Cardiac and Pulmonary Rehab Latest Ref Rng & Units 05/06/2015 05/08/2015 10/31/2015 10/31/2015 10/31/2015   Cholestrol 0 - 200 mg/dL - - - - -   LDLCALC 0 - 100 mg/dL - - - - -   HDL 40 - 60 mg/dL - - - - -   Trlycerides 0 - 200 mg/dL - - - - -   Hemoglobin A1c 4.8 - 5.6 % - - 5.0 - -   PHART 7.350 - 7.450 7.31(L) 7.36 - - -   PCO2ART 32.0 - 48.0 mmHg 57(H) 66(H) - - -   HCO3 20.0 - 28.0 mmol/L 28.7(H) 37.3(H) - 36.1(H) 35.9(H)   O2SAT % 96.3 96.3 - 98.2 97.4       ADL [facility]:     Pulmonary Assessment Scores    Row  Name 01/22/16 1606  °  °  °  ° ADL [facility]  ° ADL Phase Entry    ° SOB Score total 81    ° Rest 0    ° Walk 3    ° Stairs 5    ° Bath 4    ° Dress 3    ° Shop 4    °  ° ° °Pulmonary Function Assessment: °  °  °Pulmonary Function Assessment - 01/22/16 1604   °  ° Pulmonary Function Tests  ° RV% 94 %  ° DLCO% 37 %  °  ° Initial Spirometry Results  ° FVC% 47 %  ° FEV1% 28 %  ° FEV1/FVC Ratio 48  ° Comments Test date   °  ° Post Bronchodilator Spirometry Results  ° FVC% 44 %  ° FEV1% 27 %  ° FEV1/FVC Ratio 49  °  ° Breath  ° Shortness of Breath Yes;Limiting activity;Fear of Shortness of Breath  °  ° ° °Exercise Target Goals: °  ° °Exercise Program Goal: °Individual exercise prescription set with THRR, safety & activity barriers. Participant demonstrates ability to understand and report RPE using BORG scale,  to self-measure pulse accurately, and to acknowledge the importance of the exercise prescription. ° °Exercise Prescription Goal: °Starting with aerobic activity 30 plus minutes a day, 3 days per week for initial exercise prescription. Provide home exercise prescription and guidelines that participant acknowledges understanding prior to discharge. ° °Activity Barriers & Risk Stratification: °  °  °Activity Barriers & Cardiac Risk Stratification - 01/22/16 1604   °  ° Activity Barriers & Cardiac Risk Stratification  ° Activity Barriers Shortness of Breath;Deconditioning;Muscular Weakness  ° Cardiac Risk Stratification Moderate  °  ° ° °6 Minute Walk: °  °  °6 Minute Walk   ° Row Name 01/22/16 1546  °  °  °  ° 6 Minute Walk  ° Distance 990 feet    ° Walk Time 6 minutes    ° # of Rest Breaks 0    ° MPH 1.88    ° METS 3.19    ° RPE 11    ° Perceived Dyspnea  3    ° VO2 Peak 11.18    ° Symptoms No    ° Resting HR 110 bpm    ° Resting BP 108/78    ° Max Ex. HR 127 bpm    ° Max Ex. BP 108/60    °  ° Interval HR  ° Baseline HR 110    ° 1 Minute HR 113    ° 2 Minute HR 123    ° 3 Minute HR 127    ° 4 Minute HR 126    ° 5 Minute HR 125    ° 6 Minute HR 124    ° Interval Heart Rate? Yes    °  ° Interval Oxygen  ° Interval Oxygen? Yes    ° Baseline Oxygen Saturation % 92 %    ° Baseline Liters of Oxygen 2 L    ° 1 Minute Oxygen Saturation % 91 %    ° 1 Minute Liters of Oxygen 2 L    ° 2 Minute Oxygen Saturation % 94 %    ° 2 Minute Liters of Oxygen 2 L    ° 3 Minute Oxygen Saturation % 95 %    ° 3 Minute Liters of Oxygen 2 L    ° 4 Minute Oxygen Saturation % 95 %    °   4 Minute Liters of Oxygen 2 L     5 Minute Oxygen Saturation % 94 %     5 Minute Liters of Oxygen 2 L     6 Minute Oxygen Saturation % 93 %     6 Minute Liters of Oxygen 2 L     2 Minute Post Liters of Oxygen 2 L        Initial Exercise Prescription:     Initial Exercise Prescription - 01/22/16 1500      Date of Initial Exercise RX and Referring  Provider   Date 01/22/16     Oxygen   Oxygen Continuous   Liters 2     Treadmill   MPH 1.8   Grade 0.5   Minutes 15     NuStep   Level 3   Minutes 15   METs 3     REL-XR   Level 2   Minutes 15   METs 3     Prescription Details   Frequency (times per week) 3   Duration Progress to 45 minutes of aerobic exercise without signs/symptoms of physical distress     Intensity   THRR 40-80% of Max Heartrate 131-152   Ratings of Perceived Exertion 11-13   Perceived Dyspnea 0-4     Progression   Progression Continue to progress workloads to maintain intensity without signs/symptoms of physical distress.     Resistance Training   Training Prescription Yes   Weight 3      Perform Capillary Blood Glucose checks as needed.  Exercise Prescription Changes:     Exercise Prescription Changes    Row Name 01/30/16 1300 02/07/16 1200           Exercise Review   Progression --  First Full Day of Exercise  --        Response to Exercise   Blood Pressure (Admit) 110/68 108/60      Blood Pressure (Exercise) 126/70 128/62      Blood Pressure (Exit) 100/60 118/70      Heart Rate (Admit) 87 bpm 102 bpm      Heart Rate (Exercise) 118 bpm 133 bpm      Heart Rate (Exit) 100 bpm 108 bpm      Oxygen Saturation (Admit) 91 % 95 %      Oxygen Saturation (Exercise) 93 % 96 %      Oxygen Saturation (Exit) 98 % 97 %      Rating of Perceived Exertion (Exercise) 17 19      Perceived Dyspnea (Exercise) 5 8      Symptoms panic attack on treadmill  --      Duration Progress to 45 minutes of aerobic exercise without signs/symptoms of physical distress Progress to 45 minutes of aerobic exercise without signs/symptoms of physical distress      Intensity THRR unchanged THRR unchanged        Progression   Progression Continue to progress workloads to maintain intensity without signs/symptoms of physical distress. Continue to progress workloads to maintain intensity without signs/symptoms of  physical distress.      Average METs 2.23  --        Resistance Training   Training Prescription  -- Yes      Weight  -- 3      Reps  -- 10-15        Interval Training   Interval Training  -- No        Oxygen   Oxygen  --  Continuous      Liters  -- 3        Treadmill   MPH  -- 1.8      Grade  -- 0.5      Minutes  -- 15        NuStep   Level  -- 3      Minutes  -- 15      METs  -- 1.7        REL-XR   Level  -- 3      Minutes  -- 15      METs  -- 1.8         Exercise Comments:     Exercise Comments    Row Name 01/30/16 1352           Exercise Comments First full day of exercise!  Patient was oriented to gym and equipment including functions, settings, policies, and procedures.  Patient's individual exercise prescription and treatment plan were reviewed.  All starting workloads were established based on the results of the 6 minute walk test done at initial orientation visit.  The plan for exercise progression was also introduced and progression will be customized based on patient's performance and goals.First full day of exercise!  Patient was oriented to gym and equipment including functions, settings, policies, and procedures.  Patient's individual exercise prescription and treatment plan were reviewed.  All starting workloads were established based on the results of the 6 minute walk test done at initial orientation visit.  The plan for exercise progression was also introduced and progression will be customized based on patient's performance and goals.  [name] [name] did have a small panic attack on the treadmill today.  We talked though it and she recovered and was able to continue to exercise.  We will work on her treadmill tolerance and fears.          Discharge Exercise Prescription (Final Exercise Prescription Changes):     Exercise Prescription Changes - 02/07/16 1200      Response to Exercise   Blood Pressure (Admit) 108/60   Blood Pressure (Exercise) 128/62    Blood Pressure (Exit) 118/70   Heart Rate (Admit) 102 bpm   Heart Rate (Exercise) 133 bpm   Heart Rate (Exit) 108 bpm   Oxygen Saturation (Admit) 95 %   Oxygen Saturation (Exercise) 96 %   Oxygen Saturation (Exit) 97 %   Rating of Perceived Exertion (Exercise) 19   Perceived Dyspnea (Exercise) 8   Duration Progress to 45 minutes of aerobic exercise without signs/symptoms of physical distress   Intensity THRR unchanged     Progression   Progression Continue to progress workloads to maintain intensity without signs/symptoms of physical distress.     Resistance Training   Training Prescription Yes   Weight 3   Reps 10-15     Interval Training   Interval Training No     Oxygen   Oxygen Continuous   Liters 3     Treadmill   MPH 1.8   Grade 0.5   Minutes 15     NuStep   Level 3   Minutes 15   METs 1.7     REL-XR   Level 3   Minutes 15   METs 1.8       Nutrition:  Target Goals: Understanding of nutrition guidelines, daily intake of sodium '1500mg'$ , cholesterol '200mg'$ , calories 30% from fat and 7% or less from saturated fats, daily to have 5 or more servings of  fruits and vegetables.  Biometrics:     Pre Biometrics - 01/22/16 1546      Pre Biometrics   Height '5\' 3"'$  (1.6 m)   Weight 137 lb 12.8 oz (62.5 kg)   Waist Circumference 31 inches   Hip Circumference 38.5 inches   Waist to Hip Ratio 0.81 %   BMI (Calculated) 24.5       Nutrition Therapy Plan and Nutrition Goals:   Nutrition Discharge: Rate Your Plate Scores:   Psychosocial: Target Goals: Acknowledge presence or absence of depression, maximize coping skills, provide positive support system. Participant is able to verbalize types and ability to use techniques and skills needed for reducing stress and depression.  Initial Review & Psychosocial Screening:     Initial Psych Review & Screening - 01/22/16 [name] [name]? Yes   Comments [name] [name] has good family  support from her children. She has depression, but not from her COPD and did not offer an explanation. She does go to a support group on Wednesday's. She is very excited about [facility] and is ready "to make a change".     Barriers   Psychosocial barriers to participate in program The patient should benefit from training in stress management and relaxation.     Screening Interventions   Interventions Encouraged to exercise;Program counselor consult      Quality of Life Scores:     Quality of Life - 01/22/16 1614      Quality of Life Scores   Health/Function Pre 21 %   Socioeconomic Pre 21 %   Psych/Spiritual Pre 21 %   Family Pre 21 %   GLOBAL Pre 21 %      PHQ-9: Recent Review Flowsheet Data    Depression screen PHQ 2/9 01/22/2016   Decreased Interest 2   Down, Depressed, Hopeless 2   PHQ - 2 Score 4   Altered sleeping 3   Tired, decreased energy 3   Change in appetite 0   Feeling bad or failure about yourself  3   Trouble concentrating 1   Moving slowly or fidgety/restless 1   Suicidal thoughts 0   PHQ-9 Score 15   Difficult doing work/chores Very difficult      Psychosocial Evaluation and Intervention:     Psychosocial Evaluation - 02/13/16 1153      Psychosocial Evaluation & Interventions   Interventions Encouraged to exercise with the program and follow exercise prescription;Relaxation education;Stress management education   Comments Counselor met with [name] [name] today for initial psychosocial evaluation.  She is a 57 year old who has been struggling with COPD for 7-8 years and is hoping to get on the lung transplant list.  She lives alone but has (3) adult children and other family members who live close by.  She has been able to improve her sleep recently with a diagnosis of Sleep Apnea and use of a Bi-Pap -with 8-10 hours/night of sleep.  Her appetite is good as well.  [name] [name] reports a history of depression and has been on medication for this for awhile.  She has  had recent stressors that have impacted her mood with her PHQ-9 score of 15 indicating moderate depression.  Counselor processed this with [name] [name] reporting her 49 year old granddaughter was recently diagnosed with Stage 4 brain cancer.  Also, [name]. [employer] best friend recently died.  She copes with exercise; leaning on other family members and attends a support  group as well as sees a Veterinary surgeon weekly.  [name] [name] has goals to breathe better and be able to get on the lung transplant list.  Staff will follow with [name] [name] throughout the course of this program.     Continued Psychosocial Services Needed Yes  [name] [name] will benefit from the psychoeducational components of this program to help with the recent stressors in her life.        Psychosocial Re-Evaluation:     Psychosocial Re-Evaluation    Row Name 02/15/16 1120             Psychosocial Re-Evaluation   Comments Deysy called and said she is sorry that she can not exercise today in Pulmonary Rehab since she has to be out with a family emergency.          Education: Education Goals: Education classes will be provided on a weekly basis, covering required topics. Participant will state understanding/return demonstration of topics presented.  Learning Barriers/Preferences:     Learning Barriers/Preferences - 01/22/16 1604      Learning Barriers/Preferences   Learning Barriers None   Learning Preferences None      Education Topics: Initial Evaluation Education: - Verbal, written and demonstration of respiratory meds, RPE/PD scales, oximetry and breathing techniques. Instruction on use of nebulizers and MDIs: cleaning and proper use, rinsing mouth with steroid doses and importance of monitoring MDI activations. Flowsheet Row Pulmonary Rehab from 02/13/2016 in [facility] Cardiac and Pulmonary Rehab  Date  01/22/16  Educator  LB  Instruction Review Code  2- meets goals/outcomes      General Nutrition Guidelines/Fats and Fiber: -Group instruction  provided by verbal, written material, models and posters to present the general guidelines for heart healthy nutrition. Gives an explanation and review of dietary fats and fiber.   Controlling Sodium/Reading Food Labels: -Group verbal and written material supporting the discussion of sodium use in heart healthy nutrition. Review and explanation with models, verbal and written materials for utilization of the food label.   Exercise Physiology & Risk Factors: - Group verbal and written instruction with models to review the exercise physiology of the cardiovascular system and associated critical values. Details cardiovascular disease risk factors and the goals associated with each risk factor.   Aerobic Exercise & Resistance Training: - Gives group verbal and written discussion on the health impact of inactivity. On the components of aerobic and resistive training programs and the benefits of this training and how to safely progress through these programs.   Flexibility, Balance, General Exercise Guidelines: - Provides group verbal and written instruction on the benefits of flexibility and balance training programs. Provides general exercise guidelines with specific guidelines to those with heart or lung disease. Demonstration and skill practice provided.   Stress Management: - Provides group verbal and written instruction about the health risks of elevated stress, cause of high stress, and healthy ways to reduce stress.   Depression: - Provides group verbal and written instruction on the correlation between heart/lung disease and depressed mood, treatment options, and the stigmas associated with seeking treatment.   Exercise & Equipment Safety: - Individual verbal instruction and demonstration of equipment use and safety with use of the equipment. Flowsheet Row Pulmonary Rehab from 02/13/2016 in [facility] Cardiac and Pulmonary Rehab  Date  02/01/16  Educator  C. EnterkinRN  Instruction Review  Code  2- meets goals/outcomes      Infection Prevention: - Provides verbal and written material to individual with discussion of infection control including  proper hand washing and proper equipment cleaning during exercise session.   Falls Prevention: - Provides verbal and written material to individual with discussion of falls prevention and safety.   Diabetes: - Individual verbal and written instruction to review signs/symptoms of diabetes, desired ranges of glucose level fasting, after meals and with exercise. Advice that pre and post exercise glucose checks will be done for 3 sessions at entry of program.   Chronic Lung Diseases: - Group verbal and written instruction to review new updates, new respiratory medications, new advancements in procedures and treatments. Provide informative websites and "800" numbers of self-education.   Lung Procedures: - Group verbal and written instruction to describe testing methods done to diagnose lung disease. Review the outcome of test results. Describe the treatment choices: Pulmonary Function Tests, ABGs and oximetry.   Energy Conservation: - Provide group verbal and written instruction for methods to conserve energy, plan and organize activities. Instruct on pacing techniques, use of adaptive equipment and posture/positioning to relieve shortness of breath.   Triggers: - Group verbal and written instruction to review types of environmental controls: home humidity, furnaces, filters, dust mite/pet prevention, HEPA vacuums. To discuss weather changes, air quality and the benefits of nasal washing. Flowsheet Row Pulmonary Rehab from 02/13/2016 in [facility] Cardiac and Pulmonary Rehab  Date  01/30/16  Educator  LB  Instruction Review Code  2- meets goals/outcomes      Exacerbations: - Group verbal and written instruction to provide: warning signs, infection symptoms, calling MD promptly, preventive modes, and value of vaccinations. Review:  effective airway clearance, coughing and/or vibration techniques. Create an Sports administrator.   Oxygen: - Individual and group verbal and written instruction on oxygen therapy. Includes supplement oxygen, available portable oxygen systems, continuous and intermittent flow rates, oxygen safety, concentrators, and Medicare reimbursement for oxygen. Flowsheet Row Pulmonary Rehab from 02/13/2016 in [facility] Cardiac and Pulmonary Rehab  Date  01/22/16  Educator  LB  Instruction Review Code  2- meets goals/outcomes      Respiratory Medications: - Group verbal and written instruction to review medications for lung disease. Drug class, frequency, complications, importance of spacers, rinsing mouth after steroid MDI's, and proper cleaning methods for nebulizers. Flowsheet Row Pulmonary Rehab from 02/13/2016 in [facility] Cardiac and Pulmonary Rehab  Date  01/22/16  Educator  LB  Instruction Review Code  2- meets goals/outcomes      AED/CPR: - Group verbal and written instruction with the use of models to demonstrate the basic use of the AED with the basic ABC's of resuscitation.   Breathing Retraining: - Provides individuals verbal and written instruction on purpose, frequency, and proper technique of diaphragmatic breathing and pursed-lipped breathing. Applies individual practice skills. Flowsheet Row Pulmonary Rehab from 02/13/2016 in [facility] Cardiac and Pulmonary Rehab  Date  01/22/16  Educator  LB  Instruction Review Code  2- meets goals/outcomes      Anatomy and Physiology of the Lungs: - Group verbal and written instruction with the use of models to provide basic lung anatomy and physiology related to function, structure and complications of lung disease.   Heart Failure: - Group verbal and written instruction on the basics of heart failure: signs/symptoms, treatments, explanation of ejection fraction, enlarged heart and cardiomyopathy.   Sleep Apnea: - Individual verbal and written instruction to  review Obstructive Sleep Apnea. Review of risk factors, methods for diagnosing and types of masks and machines for OSA.   Anxiety: - Provides group, verbal and written instruction on the correlation between  heart/lung disease and anxiety, treatment options, and management of anxiety. Flowsheet Row Pulmonary Rehab from 02/13/2016 in [facility] Cardiac and Pulmonary Rehab  Date  02/13/16  Educator  [facility]  Instruction Review Code  2- Meets goals/outcomes      Relaxation: - Provides group, verbal and written instruction about the benefits of relaxation for patients with heart/lung disease. Also provides patients with examples of relaxation techniques.   Knowledge Questionnaire Score:     Knowledge Questionnaire Score - 01/22/16 1604      Knowledge Questionnaire Score   Pre Score 7/10       Core Components/Risk Factors/Patient Goals at Admission:     Personal Goals and Risk Factors at Admission - 01/22/16 1608      Core Components/Risk Factors/Patient Goals on Admission    Weight Management Yes  Plans to meet with the dietitian; has gained 17lbs - steroids have increased her appetite   Intervention Weight Management: Develop a combined nutrition and exercise program designed to reach desired caloric intake, while maintaining appropriate intake of nutrient and fiber, sodium and fats, and appropriate energy expenditure required for the weight goal.;Weight Management: Provide education and appropriate resources to help participant work on and attain dietary goals.;Weight Management/Obesity: Establish reasonable short term and long term weight goals.   Admit Weight 137 lb 12.8 oz (62.5 kg)   Goal Weight: Short Term 132 lb (59.9 kg)   Goal Weight: Long Term 120 lb (54.4 kg)   Expected Outcomes Short Term: Continue to assess and modify interventions until short term weight is achieved;Long Term: Adherence to nutrition and physical activity/exercise program aimed toward attainment of established  weight goal;Weight Maintenance: Understanding of the daily nutrition guidelines, which includes 25-35% calories from fat, 7% or less cal from saturated fats, less than '200mg'$  cholesterol, less than 1.5gm of sodium, & 5 or more servings of fruits and vegetables daily;Weight Loss: Understanding of general recommendations for a balanced deficit meal plan, which promotes 1-2 lb weight loss per week and includes a negative energy balance of [phone] kcal/d;Understanding recommendations for meals to include 15-35% energy as protein, 25-35% energy from fat, 35-60% energy from carbohydrates, less than '200mg'$  of dietary cholesterol, 20-35 gm of total fiber daily;Understanding of distribution of calorie intake throughout the day with the consumption of 4-5 meals/snacks   Sedentary Yes  Preparing for possible Lung Transplant surgery   Intervention Provide advice, education, support and counseling about physical activity/exercise needs.;Develop an individualized exercise prescription for aerobic and resistive training based on initial evaluation findings, risk stratification, comorbidities and participant's personal goals.   Expected Outcomes Achievement of increased cardiorespiratory fitness and enhanced flexibility, muscular endurance and strength shown through measurements of functional capacity and personal statement of participant.   Increase Strength and Stamina Yes   Intervention Provide advice, education, support and counseling about physical activity/exercise needs.;Develop an individualized exercise prescription for aerobic and resistive training based on initial evaluation findings, risk stratification, comorbidities and participant's personal goals.   Expected Outcomes Achievement of increased cardiorespiratory fitness and enhanced flexibility, muscular endurance and strength shown through measurements of functional capacity and personal statement of participant.   Improve shortness of breath with ADL's Yes    Intervention Provide education, individualized exercise plan and daily activity instruction to help decrease symptoms of SOB with activities of daily living.   Expected Outcomes Short Term: Achieves a reduction of symptoms when performing activities of daily living.   Develop more efficient breathing techniques such as purse lipped breathing and diaphragmatic breathing; and practicing  self-pacing with activity Yes   Intervention Provide education, demonstration and support about specific breathing techniuqes utilized for more efficient breathing. Include techniques such as pursed lipped breathing, diaphragmatic breathing and self-pacing activity.   Expected Outcomes Short Term: Participant will be able to demonstrate and use breathing techniques as needed throughout daily activities.   Increase knowledge of respiratory medications and ability to use respiratory devices properly  Yes  Duoneb SVN, ProAir with spacer, Dulero, Spiriva; 2l/m oxygen; BIPAP   Intervention Provide education and demonstration as needed of appropriate use of medications, inhalers, and oxygen therapy.   Expected Outcomes Short Term: Achieves understanding of medications use. Understands that oxygen is a medication prescribed by physician. Demonstrates appropriate use of inhaler and oxygen therapy.      Core Components/Risk Factors/Patient Goals Review:      Goals and Risk Factor Review    Row Name 01/30/16 1350 02/11/16 1053           Core Components/Risk Factors/Patient Goals Review   Personal Goals Review Develop more efficient breathing techniques such as purse lipped breathing and diaphragmatic breathing and practicing self-pacing with activity. Develop more efficient breathing techniques such as purse lipped breathing and diaphragmatic breathing and practicing self-pacing with activity.;Improve shortness of breath with ADL's;Weight Management/Obesity      Review Reviewed pursed lip breathing technique with [name] [name].   She has used it in the past, but still needs reminding to use during exercise. Patient stated that she has been using PBL and has noticed improvements in SOB during ADL's. She stated she would be interesed in scheduleing an appointment with the dietician. She was given information about making this appointment.       Expected Outcomes Short Term:  [name] [name] will become more proficient at using pursed lip breathing.  Long Term: [name] [name] will be independent in her remembering to use her pursed lip breathing. Patient will continue to use PLB to aid in contorl of SOB. Patient will make an appointment to meet with the dieticain.         Core Components/Risk Factors/Patient Goals at Discharge (Final Review):      Goals and Risk Factor Review - 02/11/16 1053      Core Components/Risk Factors/Patient Goals Review   Personal Goals Review Develop more efficient breathing techniques such as purse lipped breathing and diaphragmatic breathing and practicing self-pacing with activity.;Improve shortness of breath with ADL's;Weight Management/Obesity   Review Patient stated that she has been using PBL and has noticed improvements in SOB during ADL's. She stated she would be interesed in scheduleing an appointment with the dietician. She was given information about making this appointment.    Expected Outcomes Patient will continue to use PLB to aid in contorl of SOB. Patient will make an appointment to meet with the dieticain.      ITP Comments:   Comments: 30 day note review

## 2016-02-25 ENCOUNTER — Encounter: Payer: Medicare Other | Admitting: *Deleted

## 2016-02-25 DIAGNOSIS — J9621 Acute and chronic respiratory failure with hypoxia: Secondary | ICD-10-CM | POA: Diagnosis not present

## 2016-02-25 DIAGNOSIS — J449 Chronic obstructive pulmonary disease, unspecified: Secondary | ICD-10-CM

## 2016-02-25 NOTE — Progress Notes (Signed)
Daily Session Note  Patient Details  Name: [name] [name] MRN: [id number] Date of Birth: 03-09-1960 Referring Provider:    Encounter Date: 02/25/2016  Check In:     Session Check In - 02/25/16 1147      Check-In   Location [facility]   Staff Present Carson Myrtle, BS, RRT, Respiratory Dareen Piano, BA, ACSM CEP, Exercise Physiologist;Eiley Mcginnity Amedeo Plenty, BS, ACSM CEP, Exercise Physiologist   Supervising physician immediately available to respond to emergencies [facility] immediately available ER MD   Physician(s) Drs. Mariea Clonts and Latta   Medication changes reported     No   Fall or balance concerns reported    No   Warm-up and Cool-down Performed on first and last piece of equipment   Resistance Training Performed Yes   VAD Patient? No     Pain Assessment   Currently in Pain? No/denies   Multiple Pain Sites No         Goals Met:  Proper associated with RPD/PD & O2 Sat Independence with exercise equipment Exercise tolerated well Strength training completed today  Goals Unmet:  Not Applicable  Comments: Pt able to follow exercise prescription today without complaint.  Will continue to monitor for progression.    Dr. Emily Filbert is Medical Director for [facility] and [facility].

## 2016-03-07 ENCOUNTER — Encounter: Payer: Medicare Other | Attending: Pulmonary Disease

## 2016-03-07 DIAGNOSIS — J9621 Acute and chronic respiratory failure with hypoxia: Secondary | ICD-10-CM | POA: Insufficient documentation

## 2016-03-11 ENCOUNTER — Encounter: Payer: Self-pay | Admitting: Internal Medicine

## 2016-03-11 ENCOUNTER — Ambulatory Visit: Payer: Medicare Other | Attending: Internal Medicine

## 2016-03-11 DIAGNOSIS — G4719 Other hypersomnia: Secondary | ICD-10-CM

## 2016-03-11 DIAGNOSIS — G4733 Obstructive sleep apnea (adult) (pediatric): Secondary | ICD-10-CM | POA: Insufficient documentation

## 2016-03-11 DIAGNOSIS — R000 Tachycardia, unspecified: Secondary | ICD-10-CM | POA: Diagnosis present

## 2016-03-11 DIAGNOSIS — R400 Somnolence: Secondary | ICD-10-CM | POA: Diagnosis present

## 2016-03-12 ENCOUNTER — Encounter: Payer: Self-pay | Admitting: Respiratory Therapy

## 2016-03-12 DIAGNOSIS — J449 Chronic obstructive pulmonary disease, unspecified: Secondary | ICD-10-CM

## 2016-03-16 ENCOUNTER — Other Ambulatory Visit: Payer: Self-pay | Admitting: Internal Medicine

## 2016-03-17 ENCOUNTER — Telehealth: Payer: Self-pay | Admitting: Internal Medicine

## 2016-03-17 ENCOUNTER — Encounter: Payer: Self-pay | Admitting: Respiratory Therapy

## 2016-03-17 DIAGNOSIS — J449 Chronic obstructive pulmonary disease, unspecified: Secondary | ICD-10-CM

## 2016-03-17 MED ORDER — ALBUTEROL SULFATE HFA 108 (90 BASE) MCG/ACT IN AERS: INHALATION_SPRAY | RESPIRATORY_TRACT | 0 refills | Status: DC

## 2016-03-17 NOTE — Telephone Encounter (Signed)
°*  STAT* If patient is at the pharmacy, call can be transferred to refill team.   1. Which medications need to be refilled? (please list name of each medication and dose if known)  Pro air  2. Which pharmacy/location (including street and city if local pharmacy) is medication to be sent to? [facility] in [place]   3. Do they need a 30 day or 90 day supply? 90 day

## 2016-03-17 NOTE — Telephone Encounter (Signed)
Proair has been sent for 30 day supply but will send in for a 90 day supply.

## 2016-03-17 NOTE — Progress Notes (Signed)
Pulmonary Individual Treatment Plan ° °Patient Details  °Name: Saralee L Derflinger °MRN: [id number] °Date of Birth: 12/05/1960 °Referring Provider:   ° °Initial Encounter Date:  °Flowsheet Row Pulmonary Rehab from 01/22/2016 in [facility]  °Date  01/22/16  °  ° ° °Visit Diagnosis: COPD, severe (HCC) ° °Patient's Home Medications on Admission: ° °Current Outpatient Prescriptions:  °•  albuterol (PROAIR HFA) 108 (90 Base) MCG/ACT inhaler, INHALE 2 PUFFS BY MOUTH INTO THE LUNGS EVERY 6 HOURS AS NEEDED FOR WHEEZING OR SHORTNESS OF BREATH (Patient taking differently: Inhale 2 puffs into the lungs every 6 (six) hours as needed for wheezing or shortness of breath. ), Disp: 3 Inhaler, Rfl: 0 °•  ALPRAZolam (XANAX) 1 MG tablet, Take 0.5 tablets (0.5 mg total) by mouth 3 (three) times daily as needed for anxiety., Disp: 30 tablet, Rfl: 5 °•  aspirin 81 MG EC tablet, Take 1 tablet (81 mg total) by mouth daily., Disp: 30 tablet, Rfl: 1 °•  cetirizine (ZYRTEC ALLERGY) 10 MG tablet, Take 1 tablet (10 mg total) by mouth daily., Disp: 90 tablet, Rfl: 3 °•  fluticasone (FLONASE) 50 MCG/ACT nasal spray, Place 1 spray into both nostrils daily., Disp: 1 g, Rfl: 5 °•  guaiFENesin (MUCINEX) 600 MG 12 hr tablet, Take 1 tablet (600 mg total) by mouth 2 (two) times daily as needed for cough or to loosen phlegm., Disp: 20 tablet, Rfl: 0 °•  ipratropium-albuterol (DUONEB) 0.5-2.5 (3) MG/3ML SOLN, Take 3 mLs by nebulization every 4 (four) hours as needed (for wheezing/shortness of breath). (Patient taking differently: Take 3 mLs by nebulization every 4 (four) hours as needed. For wheezing/shortness of breath), Disp: 360 mL, Rfl: 2 °•  mometasone-formoterol (DULERA) 200-5 MCG/ACT AERO, Inhale 2 puffs into the lungs 2 (two) times daily., Disp: 3 Inhaler, Rfl: 3 °•  predniSONE (DELTASONE) 5 MG tablet, Take 1 tablet (5 mg total) by mouth daily with breakfast., Disp: 30 tablet, Rfl: 5 °•  QUEtiapine (SEROQUEL) 50 MG tablet,  Take 1 tablet (50 mg total) by mouth at bedtime., Disp: 30 tablet, Rfl: 0 °•  tiotropium (SPIRIVA) 18 MCG inhalation capsule, Place 1 capsule (18 mcg total) into inhaler and inhale daily., Disp: 90 capsule, Rfl: 3 ° °Past Medical History: °Past Medical History:  °Diagnosis Date  °• Breast cancer (HCC)   °• Cancer (HCC)   °• COPD (chronic obstructive pulmonary disease) (HCC)   ° ° °Tobacco Use: °History  °Smoking Status  °• Former Smoker  °• Packs/day: 0.50  °• Years: 40.00  °Smokeless Tobacco  °• Never Used  °  Comment: quit smoking 09/26/15  ° ° °Labs: °Recent Review Flowsheet Data   ° Labs for [facility] Cardiac and Pulmonary Rehab Latest Ref Rng & Units 05/06/2015 05/08/2015 10/31/2015 10/31/2015 10/31/2015  ° Cholestrol 0 - 200 mg/dL - - - - -  ° LDLCALC 0 - 100 mg/dL - - - - -  ° HDL 40 - 60 mg/dL - - - - -  ° Trlycerides 0 - 200 mg/dL - - - - -  ° Hemoglobin A1c 4.8 - 5.6 % - - 5.0 - -  ° PHART 7.350 - 7.450 7.31(L) 7.36 - - -  ° PCO2ART 32.0 - 48.0 mmHg 57(H) 66(H) - - -  ° HCO3 20.0 - 28.0 mmol/L 28.7(H) 37.3(H) - 36.1(H) 35.9(H)  ° O2SAT % 96.3 96.3 - 98.2 97.4  °  ° ° ° °ADL [facility]: °  °  °Pulmonary Assessment Scores   ° Row   Name 01/22/16 1606  °  °  °  ° ADL [facility]  ° ADL Phase Entry    ° SOB Score total 81    ° Rest 0    ° Walk 3    ° Stairs 5    ° Bath 4    ° Dress 3    ° Shop 4    °  ° ° °Pulmonary Function Assessment: °  °  °Pulmonary Function Assessment - 01/22/16 1604   °  ° Pulmonary Function Tests  ° RV% 94 %  ° DLCO% 37 %  °  ° Initial Spirometry Results  ° FVC% 47 %  ° FEV1% 28 %  ° FEV1/FVC Ratio 48  ° Comments Test date   °  ° Post Bronchodilator Spirometry Results  ° FVC% 44 %  ° FEV1% 27 %  ° FEV1/FVC Ratio 49  °  ° Breath  ° Shortness of Breath Yes;Limiting activity;Fear of Shortness of Breath  °  ° ° °Exercise Target Goals: °  ° °Exercise Program Goal: °Individual exercise prescription set with THRR, safety & activity barriers. Participant demonstrates ability to understand and report RPE using BORG scale,  to self-measure pulse accurately, and to acknowledge the importance of the exercise prescription. ° °Exercise Prescription Goal: °Starting with aerobic activity 30 plus minutes a day, 3 days per week for initial exercise prescription. Provide home exercise prescription and guidelines that participant acknowledges understanding prior to discharge. ° °Activity Barriers & Risk Stratification: °  °  °Activity Barriers & Cardiac Risk Stratification - 01/22/16 1604   °  ° Activity Barriers & Cardiac Risk Stratification  ° Activity Barriers Shortness of Breath;Deconditioning;Muscular Weakness  ° Cardiac Risk Stratification Moderate  °  ° ° °6 Minute Walk: °  °  °6 Minute Walk   ° Row Name 01/22/16 1546  °  °  °  ° 6 Minute Walk  ° Distance 990 feet    ° Walk Time 6 minutes    ° # of Rest Breaks 0    ° MPH 1.88    ° METS 3.19    ° RPE 11    ° Perceived Dyspnea  3    ° VO2 Peak 11.18    ° Symptoms No    ° Resting HR 110 bpm    ° Resting BP 108/78    ° Max Ex. HR 127 bpm    ° Max Ex. BP 108/60    °  ° Interval HR  ° Baseline HR 110    ° 1 Minute HR 113    ° 2 Minute HR 123    ° 3 Minute HR 127    ° 4 Minute HR 126    ° 5 Minute HR 125    ° 6 Minute HR 124    ° Interval Heart Rate? Yes    °  ° Interval Oxygen  ° Interval Oxygen? Yes    ° Baseline Oxygen Saturation % 92 %    ° Baseline Liters of Oxygen 2 L    ° 1 Minute Oxygen Saturation % 91 %    ° 1 Minute Liters of Oxygen 2 L    ° 2 Minute Oxygen Saturation % 94 %    ° 2 Minute Liters of Oxygen 2 L    ° 3 Minute Oxygen Saturation % 95 %    ° 3 Minute Liters of Oxygen 2 L    ° 4 Minute Oxygen Saturation % 95 %    °   4 Minute Liters of Oxygen 2 L    ° 5 Minute Oxygen Saturation % 94 %    ° 5 Minute Liters of Oxygen 2 L    ° 6 Minute Oxygen Saturation % 93 %    ° 6 Minute Liters of Oxygen 2 L    ° 2 Minute Post Liters of Oxygen 2 L    °  ° ° °Initial Exercise Prescription: °  °  °Initial Exercise Prescription - 01/22/16 1500   °  ° Date of Initial Exercise RX and Referring  Provider  ° Date 01/22/16  °  ° Oxygen  ° Oxygen Continuous  ° Liters 2  °  ° Treadmill  ° MPH 1.8  ° Grade 0.5  ° Minutes 15  °  ° NuStep  ° Level 3  ° Minutes 15  ° METs 3  °  ° REL-XR  ° Level 2  ° Minutes 15  ° METs 3  °  ° Prescription Details  ° Frequency (times per week) 3  ° Duration Progress to 45 minutes of aerobic exercise without signs/symptoms of physical distress  °  ° Intensity  ° THRR 40-80% of Max Heartrate 131-152  ° Ratings of Perceived Exertion 11-13  ° Perceived Dyspnea 0-4  °  ° Progression  ° Progression Continue to progress workloads to maintain intensity without signs/symptoms of physical distress.  °  ° Resistance Training  ° Training Prescription Yes  ° Weight 3  °  ° ° °Perform Capillary Blood Glucose checks as needed. ° °Exercise Prescription Changes: °  °  °Exercise Prescription Changes   ° Row Name 01/30/16 1300 02/07/16 1200 02/22/16 0800 03/07/16 0900  °  °  ° Exercise Review  ° Progression --  °First Full Day of Exercise  --  --  --   °  ° Response to Exercise  ° Blood Pressure (Admit) 110/68 108/60 116/78 124/60   ° Blood Pressure (Exercise) 126/70 128/62 126/60 108/68   ° Blood Pressure (Exit) 100/60 118/70 122/70 120/70   ° Heart Rate (Admit) 87 bpm 102 bpm 122 bpm 97 bpm   ° Heart Rate (Exercise) 118 bpm 133 bpm 138 bpm 134 bpm   ° Heart Rate (Exit) 100 bpm 108 bpm 106 bpm 123 bpm   ° Oxygen Saturation (Admit) 91 % 95 % 97 % 90 %   ° Oxygen Saturation (Exercise) 93 % 96 % 97 % 99 %   ° Oxygen Saturation (Exit) 98 % 97 % 98 % 98 %   ° Rating of Perceived Exertion (Exercise) 17 19 15 20  °decreased speed on TM   ° Perceived Dyspnea (Exercise) 5 8 4 4   ° Symptoms panic attack on treadmill  --  --  --   ° Duration Progress to 45 minutes of aerobic exercise without signs/symptoms of physical distress Progress to 45 minutes of aerobic exercise without signs/symptoms of physical distress Progress to 45 minutes of aerobic exercise without signs/symptoms of physical distress Progress to  45 minutes of aerobic exercise without signs/symptoms of physical distress   ° Intensity THRR unchanged THRR unchanged THRR unchanged THRR unchanged   °  ° Progression  ° Progression Continue to progress workloads to maintain intensity without signs/symptoms of physical distress. Continue to progress workloads to maintain intensity without signs/symptoms of physical distress. Continue to progress workloads to maintain intensity without signs/symptoms of physical distress. Continue to progress workloads to maintain intensity without signs/symptoms of physical distress.   °   Average METs 2.23  --  --  --      Horticulturist, commercial Prescription  -- Yes Yes Yes    Weight  -- _0 Reps  -- 10-15 10-15 10-15      Interval Training   Interval Training  -- No No No      Oxygen   Oxygen  -- Continuous Continuous Continuous    Liters  -- _1 Treadmill   MPH  -- 1.8  -- 1.5    Grade  -- 0.5  -- 0.5    Minutes  -- 15  -- 15      NuStep   Level  -- 3  -- 3    Minutes  -- 15  -- 15    METs  -- 1.7  -- 2.1      REL-XR   Level  -- 3  --  --    Minutes  -- 15  --  --    METs  -- 1.8  --  --       Exercise Comments:     Exercise Comments    Row Name 01/30/16 1352 02/22/16 0906 03/07/16 0923       Exercise Comments First full day of exercise!  Patient was oriented to gym and equipment including functions, settings, policies, and procedures.  Patient's individual exercise prescription and treatment plan were reviewed.  All starting workloads were established based on the results of the 6 minute walk test done at initial orientation visit.  The plan for exercise progression was also introduced and progression will be customized based on patient's performance and goals.First full day of exercise!  Patient was oriented to gym and equipment including functions, settings, policies, and procedures.  Patient's individual exercise prescription and treatment plan were reviewed.  All  starting workloads were established based on the results of the 6 minute walk test done at initial orientation visit.  The plan for exercise progression was also introduced and progression will be customized based on patient's performance and goals.  Markie did have a small panic attack on the treadmill today.  We talked though it and she recovered and was able to continue to exercise.  We will work on her treadmill tolerance and fears. [name] is progressing well with exercise and has not had any more panic attacks. [name] has missed sessions due to her granddaughter being very ill.        Discharge Exercise Prescription (Final Exercise Prescription Changes):     Exercise Prescription Changes - 03/07/16 0900      Response to Exercise   Blood Pressure (Admit) 124/60   Blood Pressure (Exercise) 108/68   Blood Pressure (Exit) 120/70   Heart Rate (Admit) 97 bpm   Heart Rate (Exercise) 134 bpm   Heart Rate (Exit) 123 bpm   Oxygen Saturation (Admit) 90 %   Oxygen Saturation (Exercise) 99 %   Oxygen Saturation (Exit) 98 %   Rating of Perceived Exertion (Exercise) 20  decreased speed on TM   Perceived Dyspnea (Exercise) 4   Duration Progress to 45 minutes of aerobic exercise without signs/symptoms of physical distress   Intensity THRR unchanged     Progression   Progression Continue to progress workloads to maintain intensity without signs/symptoms of physical distress.     Resistance Training   Training Prescription Yes   Weight 3   Reps 10-15     Interval  Training  ° Interval Training No  °  ° Oxygen  ° Oxygen Continuous  ° Liters 3  °  ° Treadmill  ° MPH 1.5  ° Grade 0.5  ° Minutes 15  °  ° NuStep  ° Level 3  ° Minutes 15  ° METs 2.1  °  ° ° ° °Nutrition:  °Target Goals: Understanding of nutrition guidelines, daily intake of sodium <1500mg, cholesterol <200mg, calories 30% from fat and 7% or less from saturated fats, daily to have 5 or more servings of fruits and  vegetables. ° °Biometrics: °  °  °Pre Biometrics - 01/22/16 1546   °  ° Pre Biometrics  ° Height 5' 3" (1.6 m)  ° Weight 137 lb 12.8 oz (62.5 kg)  ° Waist Circumference 31 inches  ° Hip Circumference 38.5 inches  ° Waist to Hip Ratio 0.81 %  ° BMI (Calculated) 24.5  °  ° ° ° °Nutrition Therapy Plan and Nutrition Goals: ° ° °Nutrition Discharge: Rate Your Plate Scores: ° ° °Psychosocial: °Target Goals: Acknowledge presence or absence of depression, maximize coping skills, provide positive support system. Participant is able to verbalize types and ability to use techniques and skills needed for reducing stress and depression. ° °Initial Review & Psychosocial Screening: °  °  °Initial Psych Review & Screening - 01/22/16 1612   °  ° Family Dynamics  ° Good Support System? Yes  ° Comments Ms Applegate has good family support from her children. She has depression, but not from her COPD and did not offer an explanation. She does go to a support group on Wednesday's. She is very excited about [facility] and is ready "to make a change".  °  ° Barriers  ° Psychosocial barriers to participate in program The patient should benefit from training in stress management and relaxation.  °  ° Screening Interventions  ° Interventions Encouraged to exercise;Program counselor consult  °  ° ° °Quality of Life Scores: °  °  °Quality of Life - 01/22/16 1614   °  ° Quality of Life Scores  ° Health/Function Pre 21 %  ° Socioeconomic Pre 21 %  ° Psych/Spiritual Pre 21 %  ° Family Pre 21 %  ° GLOBAL Pre 21 %  °  ° ° °PHQ-9: °Recent Review Flowsheet Data   ° Depression screen PHQ 2/9 01/22/2016  ° Decreased Interest 2  ° Down, Depressed, Hopeless 2  ° PHQ - 2 Score 4  ° Altered sleeping 3  ° Tired, decreased energy 3  ° Change in appetite 0  ° Feeling bad or failure about yourself  3  ° Trouble concentrating 1  ° Moving slowly or fidgety/restless 1  ° Suicidal thoughts 0  ° PHQ-9 Score 15  ° Difficult doing work/chores Very difficult  °   ° ° °Psychosocial Evaluation and Intervention: °  °  °Psychosocial Evaluation - 03/12/16 1501   °  ° Psychosocial Evaluation & Interventions  ° Comments Counselor met with Ms. B today for initial psychosocial evaluation.  She is a 55 year old who has been struggling with COPD for 7-8 years and is hoping to get on the lung transplant list.  She lives alone but has (3) adult children and other family members who live close by.  She has been able to improve her sleep recently with a diagnosis of Sleep Apnea and use of a Bi-Pap -with 8-10 hours/night of sleep.  Her appetite is good as well.  Ms. B   reports a history of depression and has been on medication for this for awhile.  She has had recent stressors that have impacted her mood with her PHQ-9 score of 15 indicating moderate depression.  Counselor processed this with Ms. B reporting her 8 year old granddaughter was recently diagnosed with Stage 4 brain cancer.  Also, Ms. B's best friend recently died.  She copes with exercise; leaning on other family members and attends a support group as well as sees a counselor weekly.  Ms. B has goals to breathe better and be able to get on the lung transplant list.  Staff will follow with Ms. B throughout the course of this program.    °  ° ° °Psychosocial Re-Evaluation: °  °  °Psychosocial Re-Evaluation   ° Row Name 02/15/16 1120 03/12/16 1501  °  °  °  °  ° Psychosocial Re-Evaluation  ° Comments Tashi called and said she is sorry that she can not exercise today in Pulmonary Rehab since she has to be out with a family emergency.  Ms Macon on her last visit to [facility] stated that her granddaughter was battling brain cancer. This has been very difficult, and Ms Haak will be with her as much as she can to support her through this illness. Ms Jasmin is relying on her Christian faith for strength and is praying for a miracle     °  ° °Education: °Education Goals: Education classes will be provided on a weekly basis,  covering required topics. Participant will state understanding/return demonstration of topics presented. ° °Learning Barriers/Preferences: °  °  °Learning Barriers/Preferences - 01/22/16 1604   °  ° Learning Barriers/Preferences  ° Learning Barriers None  ° Learning Preferences None  °  ° ° °Education Topics: °Initial Evaluation Education: °- Verbal, written and demonstration of respiratory meds, RPE/PD scales, oximetry and breathing techniques. Instruction on use of nebulizers and MDIs: cleaning and proper use, rinsing mouth with steroid doses and importance of monitoring MDI activations. °Flowsheet Row Pulmonary Rehab from 02/13/2016 in [facility]  °Date  01/22/16  °Educator  LB  °Instruction Review Code  2- meets goals/outcomes  °  ° ° °General Nutrition Guidelines/Fats and Fiber: °-Group instruction provided by verbal, written material, models and posters to present the general guidelines for heart healthy nutrition. Gives an explanation and review of dietary fats and fiber. ° ° °Controlling Sodium/Reading Food Labels: °-Group verbal and written material supporting the discussion of sodium use in heart healthy nutrition. Review and explanation with models, verbal and written materials for utilization of the food label. ° ° °Exercise Physiology & Risk Factors: °- Group verbal and written instruction with models to review the exercise physiology of the cardiovascular system and associated critical values. Details cardiovascular disease risk factors and the goals associated with each risk factor. ° ° °Aerobic Exercise & Resistance Training: °- Gives group verbal and written discussion on the health impact of inactivity. On the components of aerobic and resistive training programs and the benefits of this training and how to safely progress through these programs. ° ° °Flexibility, Balance, General Exercise Guidelines: °- Provides group verbal and written instruction on the benefits of  flexibility and balance training programs. Provides general exercise guidelines with specific guidelines to those with heart or lung disease. Demonstration and skill practice provided. ° ° °Stress Management: °- Provides group verbal and written instruction about the health risks of elevated stress, cause of high stress, and healthy ways to reduce   stress. ° ° °Depression: °- Provides group verbal and written instruction on the correlation between heart/lung disease and depressed mood, treatment options, and the stigmas associated with seeking treatment. ° ° °Exercise & Equipment Safety: °- Individual verbal instruction and demonstration of equipment use and safety with use of the equipment. °Flowsheet Row Pulmonary Rehab from 02/13/2016 in [facility]  °Date  02/01/16  °Educator  C. EnterkinRN  °Instruction Review Code  2- meets goals/outcomes  °  ° ° °Infection Prevention: °- Provides verbal and written material to individual with discussion of infection control including proper hand washing and proper equipment cleaning during exercise session. ° ° °Falls Prevention: °- Provides verbal and written material to individual with discussion of falls prevention and safety. ° ° °Diabetes: °- Individual verbal and written instruction to review signs/symptoms of diabetes, desired ranges of glucose level fasting, after meals and with exercise. Advice that pre and post exercise glucose checks will be done for 3 sessions at entry of program. ° ° °Chronic Lung Diseases: °- Group verbal and written instruction to review new updates, new respiratory medications, new advancements in procedures and treatments. Provide informative websites and "800" numbers of self-education. ° ° °Lung Procedures: °- Group verbal and written instruction to describe testing methods done to diagnose lung disease. Review the outcome of test results. Describe the treatment choices: Pulmonary Function Tests, ABGs and  oximetry. ° ° °Energy Conservation: °- Provide group verbal and written instruction for methods to conserve energy, plan and organize activities. Instruct on pacing techniques, use of adaptive equipment and posture/positioning to relieve shortness of breath. ° ° °Triggers: °- Group verbal and written instruction to review types of environmental controls: home humidity, furnaces, filters, dust mite/pet prevention, HEPA vacuums. To discuss weather changes, air quality and the benefits of nasal washing. °Flowsheet Row Pulmonary Rehab from 02/13/2016 in [facility]  °Date  01/30/16  °Educator  LB  °Instruction Review Code  2- meets goals/outcomes  °  ° ° °Exacerbations: °- Group verbal and written instruction to provide: warning signs, infection symptoms, calling MD promptly, preventive modes, and value of vaccinations. Review: effective airway clearance, coughing and/or vibration techniques. Create an Action Plan. ° ° °Oxygen: °- Individual and group verbal and written instruction on oxygen therapy. Includes supplement oxygen, available portable oxygen systems, continuous and intermittent flow rates, oxygen safety, concentrators, and Medicare reimbursement for oxygen. °Flowsheet Row Pulmonary Rehab from 02/13/2016 in [facility]  °Date  01/22/16  °Educator  LB  °Instruction Review Code  2- meets goals/outcomes  °  ° ° °Respiratory Medications: °- Group verbal and written instruction to review medications for lung disease. Drug class, frequency, complications, importance of spacers, rinsing mouth after steroid MDI's, and proper cleaning methods for nebulizers. °Flowsheet Row Pulmonary Rehab from 02/13/2016 in [facility]  °Date  01/22/16  °Educator  LB  °Instruction Review Code  2- meets goals/outcomes  °  ° ° °AED/CPR: °- Group verbal and written instruction with the use of models to demonstrate the basic use of the AED with the basic ABC's of  resuscitation. ° ° °Breathing Retraining: °- Provides individuals verbal and written instruction on purpose, frequency, and proper technique of diaphragmatic breathing and pursed-lipped breathing. Applies individual practice skills. °Flowsheet Row Pulmonary Rehab from 02/13/2016 in [facility]  °Date  01/22/16  °Educator  LB  °Instruction Review Code  2- meets goals/outcomes  °  ° ° °Anatomy and   Physiology of the Lungs: - Group verbal and written instruction with the use of models to provide basic lung anatomy and physiology related to function, structure and complications of lung disease.   Heart Failure: - Group verbal and written instruction on the basics of heart failure: signs/symptoms, treatments, explanation of ejection fraction, enlarged heart and cardiomyopathy.   Sleep Apnea: - Individual verbal and written instruction to review Obstructive Sleep Apnea. Review of risk factors, methods for diagnosing and types of masks and machines for OSA.   Anxiety: - Provides group, verbal and written instruction on the correlation between heart/lung disease and anxiety, treatment options, and management of anxiety. Flowsheet Row Pulmonary Rehab from 02/13/2016 in [facility]  Date  02/13/16  Educator  [facility]  Instruction Review Code  2- Meets goals/outcomes      Relaxation: - Provides group, verbal and written instruction about the benefits of relaxation for patients with heart/lung disease. Also provides patients with examples of relaxation techniques.   Knowledge Questionnaire Score:     Knowledge Questionnaire Score - 01/22/16 1604      Knowledge Questionnaire Score   Pre Score 7/10       Core Components/Risk Factors/Patient Goals at Admission:     Personal Goals and Risk Factors at Admission - 01/22/16 1608      Core Components/Risk Factors/Patient Goals on Admission    Weight Management Yes  Plans to meet with the dietitian; has gained  17lbs - steroids have increased her appetite   Intervention Weight Management: Develop a combined nutrition and exercise program designed to reach desired caloric intake, while maintaining appropriate intake of nutrient and fiber, sodium and fats, and appropriate energy expenditure required for the weight goal.;Weight Management: Provide education and appropriate resources to help participant work on and attain dietary goals.;Weight Management/Obesity: Establish reasonable short term and long term weight goals.   Admit Weight 137 lb 12.8 oz (62.5 kg)   Goal Weight: Short Term 132 lb (59.9 kg)   Goal Weight: Long Term 120 lb (54.4 kg)   Expected Outcomes Short Term: Continue to assess and modify interventions until short term weight is achieved;Long Term: Adherence to nutrition and physical activity/exercise program aimed toward attainment of established weight goal;Weight Maintenance: Understanding of the daily nutrition guidelines, which includes 25-35% calories from fat, 7% or less cal from saturated fats, less than 27m cholesterol, less than 1.5gm of sodium, & 5 or more servings of fruits and vegetables daily;Weight Loss: Understanding of general recommendations for a balanced deficit meal plan, which promotes 1-2 lb weight loss per week and includes a negative energy balance of [phone] kcal/d;Understanding recommendations for meals to include 15-35% energy as protein, 25-35% energy from fat, 35-60% energy from carbohydrates, less than 2092mof dietary cholesterol, 20-35 gm of total fiber daily;Understanding of distribution of calorie intake throughout the day with the consumption of 4-5 meals/snacks   Sedentary Yes  Preparing for possible Lung Transplant surgery   Intervention Provide advice, education, support and counseling about physical activity/exercise needs.;Develop an individualized exercise prescription for aerobic and resistive training based on initial evaluation findings, risk  stratification, comorbidities and participant's personal goals.   Expected Outcomes Achievement of increased cardiorespiratory fitness and enhanced flexibility, muscular endurance and strength shown through measurements of functional capacity and personal statement of participant.   Increase Strength and Stamina Yes   Intervention Provide advice, education, support and counseling about physical activity/exercise needs.;Develop an individualized exercise prescription for aerobic and resistive training based on initial evaluation  findings, risk stratification, comorbidities and participant's personal goals.  ° Expected Outcomes Achievement of increased cardiorespiratory fitness and enhanced flexibility, muscular endurance and strength shown through measurements of functional capacity and personal statement of participant.  ° Improve shortness of breath with ADL's Yes  ° Intervention Provide education, individualized exercise plan and daily activity instruction to help decrease symptoms of SOB with activities of daily living.  ° Expected Outcomes Short Term: Achieves a reduction of symptoms when performing activities of daily living.  ° Develop more efficient breathing techniques such as purse lipped breathing and diaphragmatic breathing; and practicing self-pacing with activity Yes  ° Intervention Provide education, demonstration and support about specific breathing techniuqes utilized for more efficient breathing. Include techniques such as pursed lipped breathing, diaphragmatic breathing and self-pacing activity.  ° Expected Outcomes Short Term: Participant will be able to demonstrate and use breathing techniques as needed throughout daily activities.  ° Increase knowledge of respiratory medications and ability to use respiratory devices properly  Yes  °Duoneb SVN, ProAir with spacer, Dulero, Spiriva; 2l/m oxygen; BIPAP  ° Intervention Provide education and demonstration as needed of appropriate use of  medications, inhalers, and oxygen therapy.  ° Expected Outcomes Short Term: Achieves understanding of medications use. Understands that oxygen is a medication prescribed by physician. Demonstrates appropriate use of inhaler and oxygen therapy.  °  ° ° °Core Components/Risk Factors/Patient Goals Review:  °  °  °Goals and Risk Factor Review   ° Row Name 01/30/16 1350 02/11/16 1053  °  °  °  °  ° Core Components/Risk Factors/Patient Goals Review  ° Personal Goals Review Develop more efficient breathing techniques such as purse lipped breathing and diaphragmatic breathing and practicing self-pacing with activity. Develop more efficient breathing techniques such as purse lipped breathing and diaphragmatic breathing and practicing self-pacing with activity.;Improve shortness of breath with ADL's;Weight Management/Obesity     ° Review Reviewed pursed lip breathing technique with Markie.  She has used it in the past, but still needs reminding to use during exercise. Patient stated that she has been using PBL and has noticed improvements in SOB during ADL's. She stated she would be interesed in scheduleing an appointment with the dietician. She was given information about making this appointment.      ° Expected Outcomes Short Term:  Markie will become more proficient at using pursed lip breathing.  Long Term: Markie will be independent in her remembering to use her pursed lip breathing. Patient will continue to use PLB to aid in contorl of SOB. Patient will make an appointment to meet with the dieticain.     °  ° ° °Core Components/Risk Factors/Patient Goals at Discharge (Final Review):  °  °  °Goals and Risk Factor Review - 02/11/16 1053   °  ° Core Components/Risk Factors/Patient Goals Review  ° Personal Goals Review Develop more efficient breathing techniques such as purse lipped breathing and diaphragmatic breathing and practicing self-pacing with activity.;Improve shortness of breath with ADL's;Weight  Management/Obesity  ° Review Patient stated that she has been using PBL and has noticed improvements in SOB during ADL's. She stated she would be interesed in scheduleing an appointment with the dietician. She was given information about making this appointment.   ° Expected Outcomes Patient will continue to use PLB to aid in contorl of SOB. Patient will make an appointment to meet with the dieticain.  °  ° ° °ITP Comments: °  °  °ITP Comments   ° Row Name 02/26/16   0732 03/12/16 1459         ITP Comments Ms Cogar states she may be out several sessions. Her grand daughter has stage 4 brain cancer, and Ms Ra helps with her care, as well as spends time with her much as possible. Ms Vosler left a message with the front desk that she has been sick and expects to be back to [facility] this week.          Comments: 30 Day Note Review

## 2016-03-19 ENCOUNTER — Telehealth: Payer: Self-pay | Admitting: *Deleted

## 2016-03-19 DIAGNOSIS — G4733 Obstructive sleep apnea (adult) (pediatric): Secondary | ICD-10-CM

## 2016-03-19 NOTE — Telephone Encounter (Signed)
Pt informed of sleep study was positive. Will place CPAP titration.

## 2016-03-19 NOTE — Telephone Encounter (Signed)
-----   Message from Laverle Hobby, MD sent at 03/18/2016  6:11 PM EST ----- Regarding: sleep study results.  Sleep study positive for OSA with AHI of 11.   Recommend cpap titration study.

## 2016-03-20 ENCOUNTER — Telehealth: Payer: Self-pay

## 2016-03-20 NOTE — Telephone Encounter (Signed)
Granddaughter is still still not well but markie plans to return to [facility] Monday 2/19.

## 2016-03-21 ENCOUNTER — Encounter: Payer: Self-pay | Admitting: Internal Medicine

## 2016-03-21 ENCOUNTER — Ambulatory Visit: Payer: Medicare Other | Attending: Internal Medicine

## 2016-03-21 DIAGNOSIS — G4736 Sleep related hypoventilation in conditions classified elsewhere: Secondary | ICD-10-CM | POA: Insufficient documentation

## 2016-03-21 DIAGNOSIS — J9620 Acute and chronic respiratory failure, unspecified whether with hypoxia or hypercapnia: Secondary | ICD-10-CM | POA: Diagnosis not present

## 2016-03-21 DIAGNOSIS — R000 Tachycardia, unspecified: Secondary | ICD-10-CM | POA: Diagnosis present

## 2016-03-21 DIAGNOSIS — G4733 Obstructive sleep apnea (adult) (pediatric): Secondary | ICD-10-CM | POA: Diagnosis present

## 2016-03-24 ENCOUNTER — Encounter: Payer: Medicare Other | Admitting: *Deleted

## 2016-03-24 DIAGNOSIS — J449 Chronic obstructive pulmonary disease, unspecified: Secondary | ICD-10-CM

## 2016-03-24 DIAGNOSIS — G4733 Obstructive sleep apnea (adult) (pediatric): Secondary | ICD-10-CM | POA: Diagnosis not present

## 2016-03-24 DIAGNOSIS — J9621 Acute and chronic respiratory failure with hypoxia: Secondary | ICD-10-CM | POA: Diagnosis present

## 2016-03-24 NOTE — Progress Notes (Signed)
Daily Session Note  Patient Details  Name: [name] [name] MRN: [id number] Date of Birth: 1960/09/01 Referring Provider:    Encounter Date: 03/24/2016  Check In:     Session Check In - 03/24/16 1015      Check-In   Location [facility]   Staff Present Carson Myrtle, BS, RRT, Respiratory Dareen Piano, BA, ACSM CEP, Exercise Physiologist;Jaycen Vercher Amedeo Plenty, BS, ACSM CEP, Exercise Physiologist   Supervising physician immediately available to respond to emergencies [facility] immediately available ER MD   Physician(s) Alfred Levins and Corky Downs   Medication changes reported     No   Fall or balance concerns reported    No   Warm-up and Cool-down Performed on first and last piece of equipment   Resistance Training Performed Yes   VAD Patient? No     Pain Assessment   Currently in Pain? No/denies   Multiple Pain Sites No         Goals Met:  Proper associated with RPD/PD & O2 Sat Independence with exercise equipment Exercise tolerated well Personal goals reviewed Strength training completed today  Goals Unmet:  Not Applicable  Comments: Pt able to follow exercise prescription today without complaint.  Will continue to monitor for progression.    Dr. Emily Filbert is Medical Director for [place] and [facility].

## 2016-03-26 DIAGNOSIS — G4733 Obstructive sleep apnea (adult) (pediatric): Secondary | ICD-10-CM | POA: Diagnosis not present

## 2016-03-28 ENCOUNTER — Telehealth: Payer: Self-pay | Admitting: *Deleted

## 2016-03-28 DIAGNOSIS — J449 Chronic obstructive pulmonary disease, unspecified: Secondary | ICD-10-CM

## 2016-03-28 DIAGNOSIS — G4733 Obstructive sleep apnea (adult) (pediatric): Secondary | ICD-10-CM

## 2016-03-28 NOTE — Telephone Encounter (Signed)
Pt informed of BiPAP changes and that ONO is needed.

## 2016-03-28 NOTE — Telephone Encounter (Signed)
-----   Message from Laverle Hobby, MD sent at 03/26/2016  3:50 PM EST ----- Regarding: CPAP titration   recommend that the patient be treated with auto-bipap with a minimum pressure of 6, maximum pressure of 20, pressure support of 7.    .The should undergo overnight oxymetry on PAP to determine need for oxygen as the finding of this test are equivocal.

## 2016-03-31 ENCOUNTER — Encounter: Payer: Medicare Other | Admitting: *Deleted

## 2016-03-31 DIAGNOSIS — J9621 Acute and chronic respiratory failure with hypoxia: Secondary | ICD-10-CM | POA: Diagnosis not present

## 2016-03-31 DIAGNOSIS — J449 Chronic obstructive pulmonary disease, unspecified: Secondary | ICD-10-CM

## 2016-03-31 NOTE — Progress Notes (Signed)
Daily Session Note  Patient Details  Name: [name] [name] MRN: [id number] Date of Birth: [birth year] Referring Provider:    Encounter Date: 03/31/2016  Check In:     Session Check In - 03/31/16 1013      Check-In   Location [facility]   Staff Present Nada Maclachlan, BA, ACSM CEP, Exercise Physiologist;Laureen Owens Shark, BS, RRT, Respiratory Bertis Ruddy, BS, ACSM CEP, Exercise Physiologist   Supervising physician immediately available to respond to emergencies [facility] immediately available ER MD   Physician(s) Jimmye Norman and Quentin Cornwall   Medication changes reported     No   Fall or balance concerns reported    No   Warm-up and Cool-down Performed as group-led instruction   Resistance Training Performed Yes   VAD Patient? No     Pain Assessment   Currently in Pain? No/denies   Multiple Pain Sites No           Exercise Prescription Changes - 03/31/16 1100      Response to Exercise   Duration Progress to 45 minutes of aerobic exercise without signs/symptoms of physical distress   Intensity THRR unchanged     Progression   Progression Continue to progress workloads to maintain intensity without signs/symptoms of physical distress.     Resistance Training   Training Prescription Yes   Weight 3   Reps 10-15     Interval Training   Interval Training No     Oxygen   Oxygen Continuous   Liters 3     Treadmill   MPH 1.5   Grade 0.5   Minutes 15     NuStep   Level 3   Minutes 15   METs 2.1     Home Exercise Plan   Plans to continue exercise at Home (comment)  considering Financial controller upon graduation of program   Frequency Add 1 additional day to program exercise sessions.   Initial Home Exercises Provided 03/31/16      History  Smoking Status  . Former Smoker  . Packs/day: 0.50  . Years: 40.00  Smokeless Tobacco  . Never Used    Comment: quit smoking 09/26/15    Goals Met:  Proper associated with RPD/PD & O2  Sat Independence with exercise equipment Exercise tolerated well Personal goals reviewed Strength training completed today  Goals Unmet:     Comments:Pt able to follow exercise prescription today without complaint.  Will continue to monitor for progression.   Dr. Emily Filbert is Medical Director for [place] and [facility].

## 2016-04-02 ENCOUNTER — Encounter: Payer: Medicare Other | Admitting: Respiratory Therapy

## 2016-04-02 DIAGNOSIS — J9621 Acute and chronic respiratory failure with hypoxia: Secondary | ICD-10-CM | POA: Diagnosis not present

## 2016-04-02 DIAGNOSIS — J449 Chronic obstructive pulmonary disease, unspecified: Secondary | ICD-10-CM

## 2016-04-02 NOTE — Progress Notes (Signed)
Daily Session Note  Patient Details  Name: [name] MRN: [id number] Date of Birth: Oct 24, 1960 Referring Provider:    Encounter Date: 04/02/2016  Check In:     Session Check In - 04/02/16 1133      Check-In   Location [facility]   Staff Present Carson Myrtle, BS, RRT, Respiratory Lennie Hummer, MA, ACSM RCEP, Exercise Physiologist;Other   Supervising physician immediately available to respond to emergencies [facility] immediately available ER MD   Physician(s) Jimmye Norman and [place]   Medication changes reported     No   Fall or balance concerns reported    No   Warm-up and Cool-down Performed as group-led instruction   Resistance Training Performed Yes   VAD Patient? No     Pain Assessment   Currently in Pain? No/denies   Multiple Pain Sites No         History  Smoking Status   Former Smoker   Packs/day: 0.50   Years: 40.00  Smokeless Tobacco   Never Used    Comment: quit smoking 09/26/15    Goals Met:  Proper associated with RPD/PD & O2 Sat Independence with exercise equipment Using PLB without cueing & demonstrates good technique Exercise tolerated well Strength training completed today  Goals Unmet:  Not Applicable  Comments: Pt able to follow exercise prescription today without complaint.  Will continue to monitor for progression.    Dr. Emily Filbert is Medical Director for [place] and [facility].

## 2016-04-04 ENCOUNTER — Encounter: Payer: Medicare Other | Attending: Pulmonary Disease

## 2016-04-04 DIAGNOSIS — J9621 Acute and chronic respiratory failure with hypoxia: Secondary | ICD-10-CM | POA: Insufficient documentation

## 2016-04-10 IMAGING — CR DG CHEST 2V
2 series · 2 of 2 positions shown · non-contrast
Comparison: 07/16/2014

CLINICAL DATA: Central and left-sided chest pain since this morning
history of COPD and hypertension

EXAM:
CHEST  2 VIEW

[chest pa]
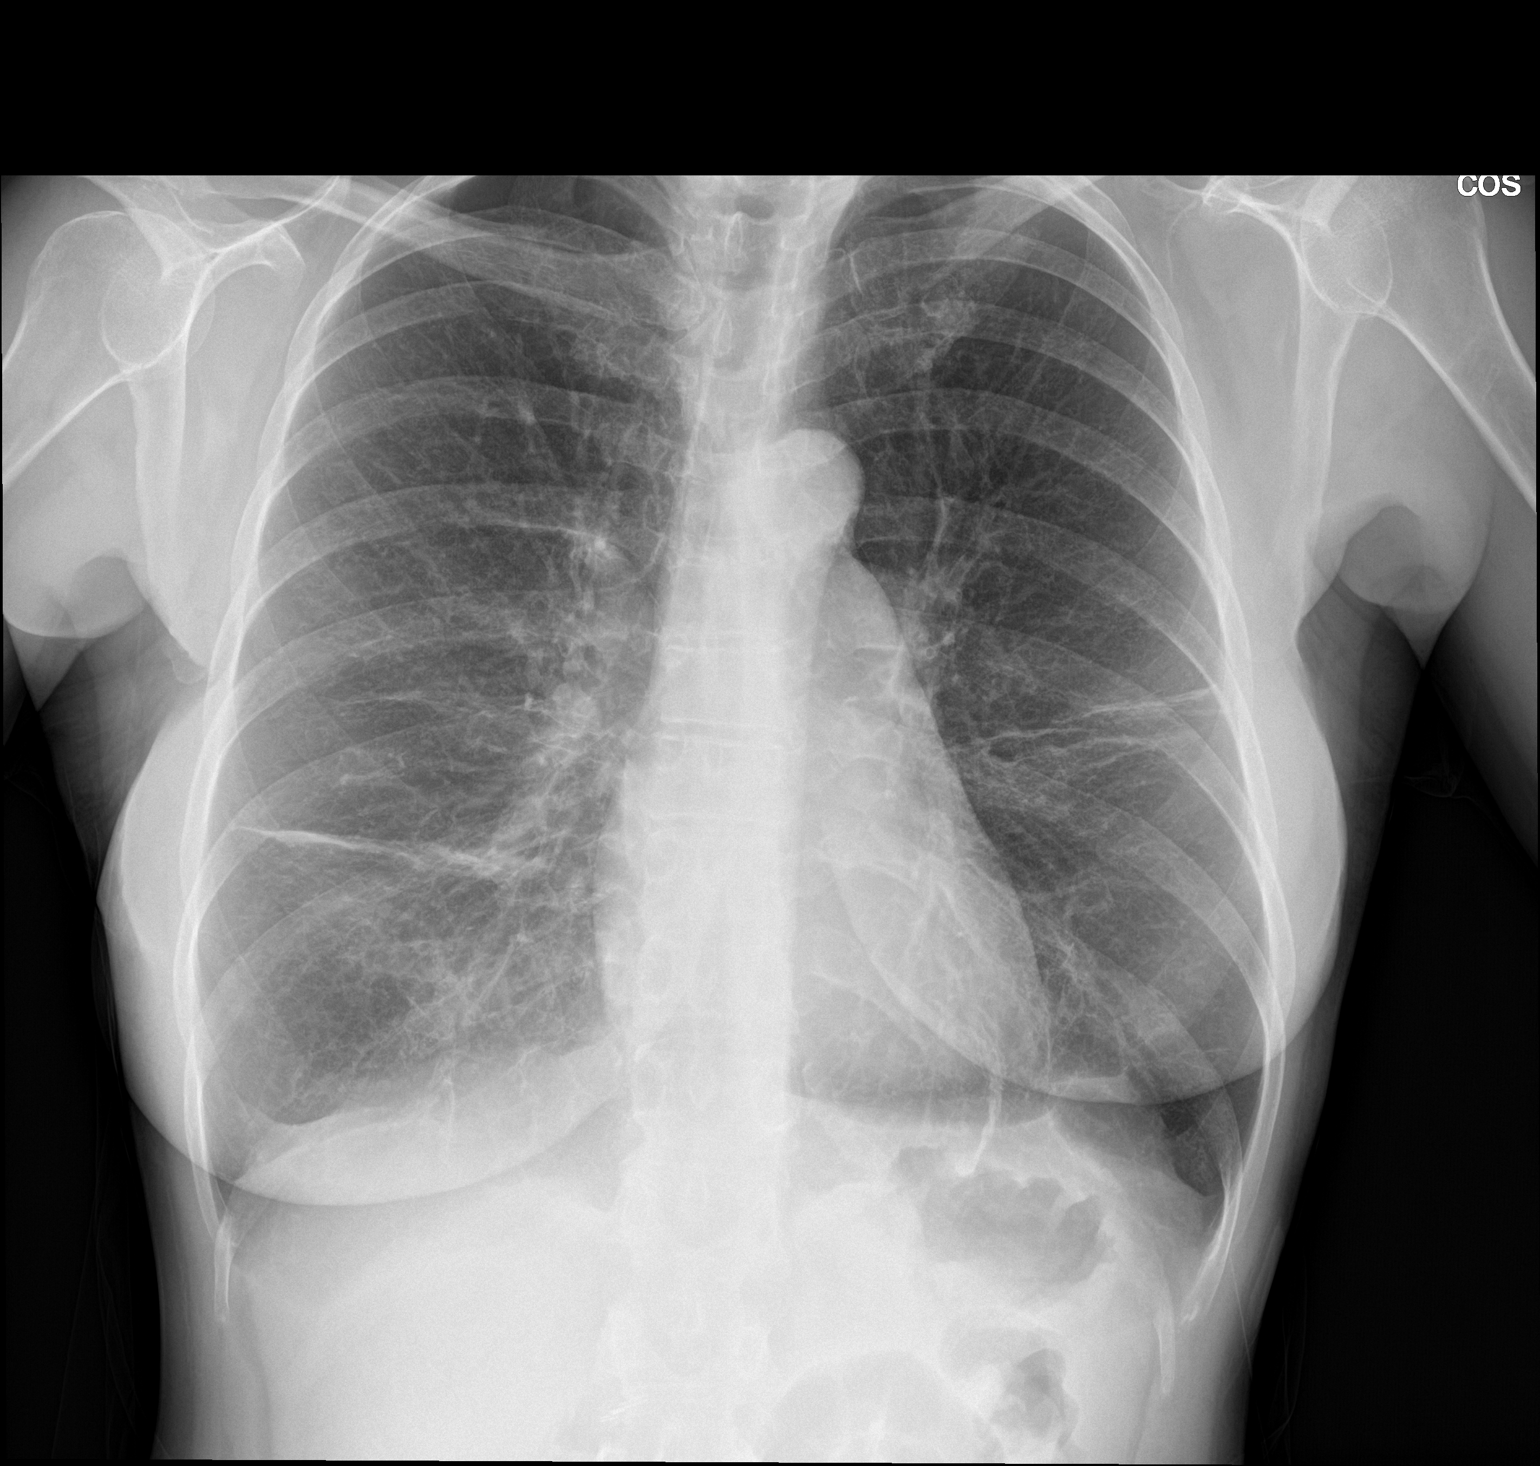

[chest lat]
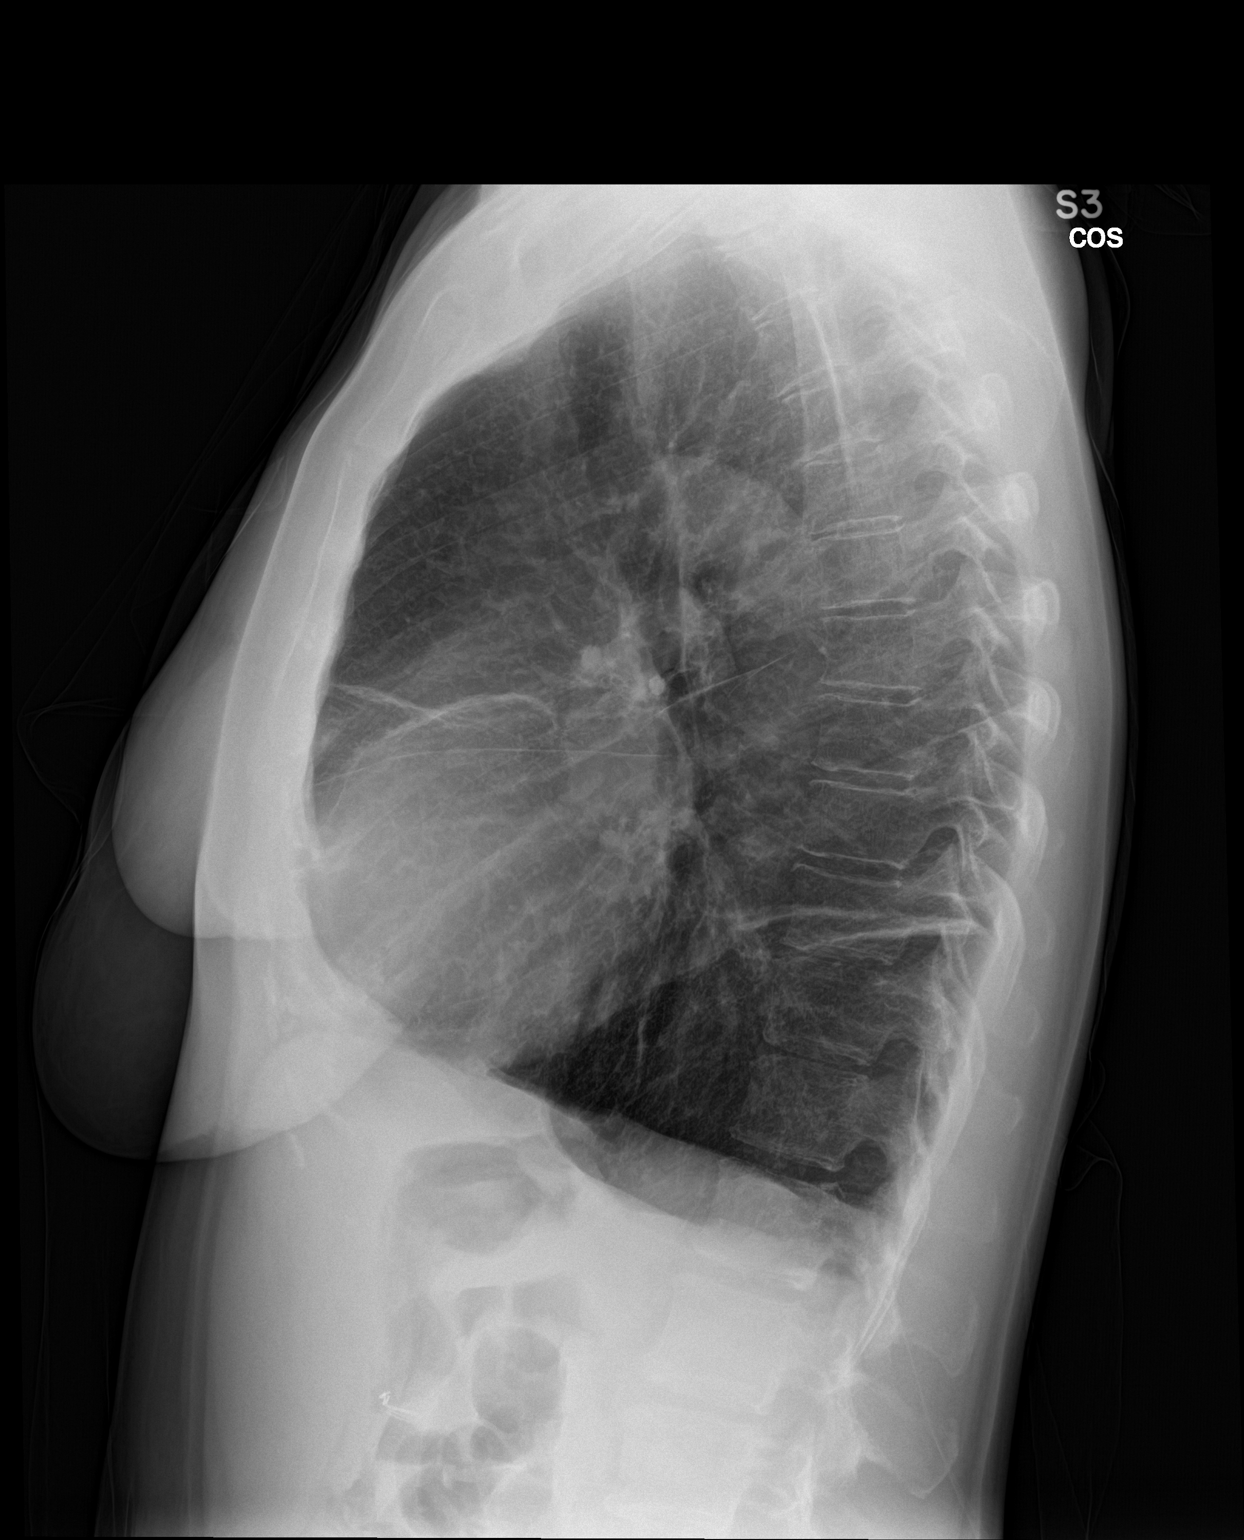

[2 of 2 positions shown; findings below may reference images not displayed]

FINDINGS: The heart size and vascular pattern are normal. Mild stable scarring
in both midlung zones and in the lower lung zones. Hyperinflation
suggests COPD. No infiltrate or consolidation.
IMPRESSION: COPD with stable parenchymal scarring.

## 2016-04-11 ENCOUNTER — Other Ambulatory Visit: Payer: Self-pay | Admitting: Internal Medicine

## 2016-04-11 ENCOUNTER — Telehealth: Payer: Self-pay | Admitting: Internal Medicine

## 2016-04-11 MED ORDER — IPRATROPIUM-ALBUTEROL 0.5-2.5 (3) MG/3ML IN SOLN: 3.0000 mL | RESPIRATORY_TRACT | 2 refills | Status: DC | PRN

## 2016-04-11 NOTE — Telephone Encounter (Signed)
RX sent to pharmacy. Nothing further needed. 

## 2016-04-11 NOTE — Telephone Encounter (Signed)
Pt called stating that she needs a refill of her DuoNeb nebulizer medication called in to [facility] in [place]. Pt has appointment with Dr. Mortimer Fries on 05/22/16.  Rhonda J Cobb

## 2016-04-14 ENCOUNTER — Encounter: Payer: Self-pay | Admitting: Respiratory Therapy

## 2016-04-14 DIAGNOSIS — J449 Chronic obstructive pulmonary disease, unspecified: Secondary | ICD-10-CM

## 2016-04-14 NOTE — Progress Notes (Signed)
Pulmonary Individual Treatment Plan  Patient Details  Name: [name] [name] MRN: [id number] Date of Birth: 1960/11/15 Referring Provider:    Initial Encounter Date:  Flowsheet Row Pulmonary Rehab from 01/22/2016 in [facility] Cardiac and Pulmonary Rehab  Date  01/22/16      Visit Diagnosis: COPD, severe ([place])  Patient's Home Medications on Admission:  Current Outpatient Prescriptions:    albuterol (PROAIR HFA) 108 (90 [name]) MCG/ACT inhaler, INHALE 2 PUFFS BY MOUTH EVERY 6 HOURS AS NEEDED FOR WHEEZING OR SHORTNESS OF BREATH., Disp: 3 Inhaler, Rfl: 0   ALPRAZolam (XANAX) 1 MG tablet, Take 0.5 tablets (0.5 mg total) by mouth 3 (three) times daily as needed for anxiety., Disp: 30 tablet, Rfl: 5   aspirin 81 MG EC tablet, Take 1 tablet (81 mg total) by mouth daily., Disp: 30 tablet, Rfl: 1   cetirizine (ZYRTEC ALLERGY) 10 MG tablet, Take 1 tablet (10 mg total) by mouth daily., Disp: 90 tablet, Rfl: 3   fluticasone (FLONASE) 50 MCG/ACT nasal spray, Place 1 spray into both nostrils daily., Disp: 1 g, Rfl: 5   guaiFENesin (MUCINEX) 600 MG 12 hr tablet, Take 1 tablet (600 mg total) by mouth 2 (two) times daily as needed for cough or to loosen phlegm., Disp: 20 tablet, Rfl: 0   ipratropium-albuterol (DUONEB) 0.5-2.5 (3) MG/3ML SOLN, Take 3 mLs by nebulization every 4 (four) hours as needed (for wheezing/shortness of breath)., Disp: 360 mL, Rfl: 2   mometasone-formoterol (DULERA) 200-5 MCG/ACT AERO, Inhale 2 puffs into the lungs 2 (two) times daily., Disp: 3 Inhaler, Rfl: 3   predniSONE (DELTASONE) 5 MG tablet, Take 1 tablet (5 mg total) by mouth daily with breakfast., Disp: 30 tablet, Rfl: 5   QUEtiapine (SEROQUEL) 50 MG tablet, Take 1 tablet (50 mg total) by mouth at bedtime., Disp: 30 tablet, Rfl: 0   tiotropium (SPIRIVA) 18 MCG inhalation capsule, Place 1 capsule (18 mcg total) into inhaler and inhale daily., Disp: 90 capsule, Rfl: 3  Past Medical History: Past Medical History:   Diagnosis Date   Breast cancer ([place])    Cancer ([place])    COPD (chronic obstructive pulmonary disease) ([place])     Tobacco Use: History  Smoking Status   Former Smoker   Packs/day: 0.50   Years: 40.00  Smokeless Tobacco   Never Used    Comment: quit smoking 09/26/15    Labs: Recent Review Flowsheet Data    Labs for ITP Cardiac and Pulmonary Rehab Latest Ref Rng & Units 05/06/2015 05/08/2015 10/31/2015 10/31/2015 10/31/2015   Cholestrol 0 - 200 mg/dL - - - - -   LDLCALC 0 - 100 mg/dL - - - - -   HDL 40 - 60 mg/dL - - - - -   Trlycerides 0 - 200 mg/dL - - - - -   Hemoglobin A1c 4.8 - 5.6 % - - 5.0 - -   PHART 7.350 - 7.450 7.31(L) 7.36 - - -   PCO2ART 32.0 - 48.0 mmHg 57(H) 66(H) - - -   HCO3 20.0 - 28.0 mmol/L 28.7(H) 37.3(H) - 36.1(H) 35.9(H)   O2SAT % 96.3 96.3 - 98.2 97.4       ADL UCSD:     Pulmonary Assessment Scores    Row Name 01/22/16 1606         ADL UCSD   ADL Phase Entry     SOB Score total 81     Rest 0     Walk 3     Stairs  5     Bath 4     Dress 3     Shop 4        Pulmonary Function Assessment:     Pulmonary Function Assessment - 01/22/16 1604      Pulmonary Function Tests   RV% 94 %   DLCO% 37 %     Initial Spirometry Results   FVC% 47 %   FEV1% 28 %   FEV1/FVC Ratio 48   Comments Test date      Post Bronchodilator Spirometry Results   FVC% 44 %   FEV1% 27 %   FEV1/FVC Ratio 49     Breath   Shortness of Breath Yes;Limiting activity;Fear of Shortness of Breath      Exercise Target Goals:    Exercise Program Goal: Individual exercise prescription set with THRR, safety & activity barriers. Participant demonstrates ability to understand and report RPE using BORG scale, to self-measure pulse accurately, and to acknowledge the importance of the exercise prescription.  Exercise Prescription Goal: Starting with aerobic activity 30 plus minutes a day, 3 days per week for initial exercise prescription. Provide home exercise  prescription and guidelines that participant acknowledges understanding prior to discharge.  Activity Barriers & Risk Stratification:     Activity Barriers & Cardiac Risk Stratification - 01/22/16 1604      Activity Barriers & Cardiac Risk Stratification   Activity Barriers Shortness of Breath;Deconditioning;Muscular Weakness   Cardiac Risk Stratification Moderate      6 Minute Walk:     6 Minute Walk    Row Name 01/22/16 1546         6 Minute Walk   Distance 990 feet     Walk Time 6 minutes     # of Rest Breaks 0     MPH 1.88     METS 3.19     RPE 11     Perceived Dyspnea  3     VO2 Peak 11.18     Symptoms No     Resting HR 110 bpm     Resting BP 108/78     Max Ex. HR 127 bpm     Max Ex. BP 108/60       Interval HR   Baseline HR 110     1 Minute HR 113     2 Minute HR 123     3 Minute HR 127     4 Minute HR 126     5 Minute HR 125     6 Minute HR 124     Interval Heart Rate? Yes       Interval Oxygen   Interval Oxygen? Yes     Baseline Oxygen Saturation % 92 %     Baseline Liters of Oxygen 2 L     1 Minute Oxygen Saturation % 91 %     1 Minute Liters of Oxygen 2 L     2 Minute Oxygen Saturation % 94 %     2 Minute Liters of Oxygen 2 L     3 Minute Oxygen Saturation % 95 %     3 Minute Liters of Oxygen 2 L     4 Minute Oxygen Saturation % 95 %     4 Minute Liters of Oxygen 2 L     5 Minute Oxygen Saturation % 94 %     5 Minute Liters of Oxygen 2 L     6 Minute Oxygen Saturation % 93 %  6 Minute Liters of Oxygen 2 L     2 Minute Post Liters of Oxygen 2 L       Oxygen Initial Assessment:     Oxygen Initial Assessment - 04/14/16 0815      Home Oxygen   Home Oxygen Device Portable Concentrator;Home Concentrator   Sleep Oxygen Prescription Continuous   Liters per minute --  2-5   Home Exercise Oxygen Prescription Continuous   Liters per minute --  2-5   Home at Rest Exercise Oxygen Prescription Continuous   Liters per minute --  2-5    Compliance with Home Oxygen Use Yes     Initial 6 [name] Walk   Oxygen Used Continuous   Liters per minute 2   Resting Oxygen Saturation  during 6 [name] walk 92 %   Exercise Oxygen Saturation  during 6 [name] walk 89 %     Program Oxygen Prescription   Program Oxygen Prescription Continuous   Liters per minute --  2-4     Intervention   Short Term Goals To learn and exhibit compliance with exercise, home and travel O2 prescription;To Learn and understand importance of maintaining oxygen saturations>88%;To learn and demonstrate proper use of respiratory medications;To learn and understand importance of monitoring SPO2 with pulse oximeter and demonstrate accurate use of the pulse oximeter.;To learn and demonstrate proper purse lipped breathing techniques or other breathing techniques.   Long  Term Goals Exhibits compliance with exercise, home and travel O2 prescription;Maintenance of O2 saturations>88%;Compliance with respiratory medication;Demonstrates proper use of MDIs;Exhibits proper breathing techniques, such as purse lipped breathing or other method taught during program session;Verbalizes importance of monitoring SPO2 with pulse oximeter and return demonstration      Oxygen Re-Evaluation:     Oxygen Re-Evaluation    Row Name 04/02/16 1516             Goals/Expected Outcomes   Comments [name] [name] has been walking into [facility] with out her oxygen. We checked her O2Sat and it was 90%. I suggested she bring her oximeter and check her O2Sat's as she comes into [facility].I educated her on the importance of O2Sats in the 9()%'s for the health of her lungs and heart.          Oxygen Discharge (Final Oxygen Re-Evaluation):     Oxygen Re-Evaluation - 04/02/16 1516      Goals/Expected Outcomes   Comments [name] [name] has been walking into [facility] with out her oxygen. We checked her O2Sat and it was 90%. I suggested she bring her oximeter and check her O2Sat's as she comes into  [facility].I educated her on the importance of O2Sats in the 9()%'s for the health of her lungs and heart.      Initial Exercise Prescription:     Initial Exercise Prescription - 01/22/16 1500      Date of Initial Exercise RX and Referring Provider   Date 01/22/16     Oxygen   Oxygen Continuous   Liters 2     Treadmill   MPH 1.8   Grade 0.5   Minutes 15     NuStep   Level 3   Minutes 15   METs 3     REL-XR   Level 2   Minutes 15   METs 3     Prescription Details   Frequency (times per week) 3   Duration Progress to 45 minutes of aerobic exercise without signs/symptoms of physical distress     Intensity   THRR  40-80% of Max Heartrate 131-152   Ratings of Perceived Exertion 11-13   Perceived Dyspnea 0-4     Progression   Progression Continue to progress workloads to maintain intensity without signs/symptoms of physical distress.     Resistance Training   Training Prescription Yes   Weight 3      Perform Capillary Blood Glucose checks as needed.  Exercise Prescription Changes:     Exercise Prescription Changes    Row Name 01/30/16 1300 02/07/16 1200 02/22/16 0800 03/07/16 0900 03/31/16 1100     Response to Exercise   Blood Pressure (Admit) 110/68 108/60 116/78 124/60  --   Blood Pressure (Exercise) 126/70 128/62 126/60 108/68  --   Blood Pressure (Exit) 100/60 118/70 122/70 120/70  --   Heart Rate (Admit) 87 bpm 102 bpm 122 bpm 97 bpm  --   Heart Rate (Exercise) 118 bpm 133 bpm 138 bpm 134 bpm  --   Heart Rate (Exit) 100 bpm 108 bpm 106 bpm 123 bpm  --   Oxygen Saturation (Admit) 91 % 95 % 97 % 90 %  --   Oxygen Saturation (Exercise) 93 % 96 % 97 % 99 %  --   Oxygen Saturation (Exit) 98 % 97 % 98 % 98 %  --   Rating of Perceived Exertion (Exercise) _0 decreased speed on TM  --   Perceived Dyspnea (Exercise) _1 --   Symptoms panic attack on treadmill  --  --  --  --   Duration Progress to 45 minutes of aerobic exercise without  signs/symptoms of physical distress Progress to 45 minutes of aerobic exercise without signs/symptoms of physical distress Progress to 45 minutes of aerobic exercise without signs/symptoms of physical distress Progress to 45 minutes of aerobic exercise without signs/symptoms of physical distress Progress to 45 minutes of aerobic exercise without signs/symptoms of physical distress   Intensity _2      Progression   Progression Continue to progress workloads to maintain intensity without signs/symptoms of physical distress. Continue to progress workloads to maintain intensity without signs/symptoms of physical distress. Continue to progress workloads to maintain intensity without signs/symptoms of physical distress. Continue to progress workloads to maintain intensity without signs/symptoms of physical distress. Continue to progress workloads to maintain intensity without signs/symptoms of physical distress.   Average METs 2.23  --  --  --  --     Horticulturist, commercial Prescription  -- Yes Yes Yes Yes   Weight  -- _3 Reps  -- 10-15 10-15 10-15 10-15     Interval Training   Interval Training  -- No No No No     Oxygen   Oxygen  -- Continuous Continuous Continuous Continuous   Liters  -- _4 Treadmill   MPH  -- 1.8  -- 1.5 1.5   Grade  -- 0.5  -- 0.5 0.5   Minutes  -- 15  -- 15 15     NuStep   Level  -- 3  -- 3 3   Minutes  -- 15  -- 15 15   METs  -- 1.7  -- 2.1 2.1     REL-XR   Level  -- 3  --  --  --   Minutes  -- 15  --  --  --   METs  -- 1.8  --  --  --  Home Exercise Plan   Plans to continue exercise at  --  --  --  -- Home (comment)  considering [employer] upon graduation of program   Frequency  --  --  --  -- Add 1 additional day to program exercise sessions.   Initial Home Exercises Provided  --  --  --  -- 03/31/16     Exercise Review   Progression --  First Full Day of  Exercise  --  --  --  --   Row Name 04/03/16 1100             Response to Exercise   Blood Pressure (Admit) 110/68       Blood Pressure (Exercise) 122/74       Blood Pressure (Exit) 104/62       Heart Rate (Admit) 123 bpm       Heart Rate (Exercise) 128 bpm       Heart Rate (Exit) 117 bpm       Oxygen Saturation (Admit) 91 %       Oxygen Saturation (Exercise) 97 %       Oxygen Saturation (Exit) 96 %       Rating of Perceived Exertion (Exercise) 13       Perceived Dyspnea (Exercise) 3       Duration Progress to 45 minutes of aerobic exercise without signs/symptoms of physical distress       Intensity THRR unchanged         Progression   Progression Continue to progress workloads to maintain intensity without signs/symptoms of physical distress.         Resistance Training   Training Prescription Yes       Weight 3       Reps 10-15         Interval Training   Interval Training No         Oxygen   Oxygen Continuous       Liters 3         NuStep   Level 4       Minutes 15       METs 2.5         REL-XR   Level 1       Minutes 15       METs 2.6          Exercise Comments:     Exercise Comments    Row Name 01/30/16 1352 02/22/16 0906 03/07/16 0923 04/03/16 1133     Exercise Comments First full day of exercise!  Patient was oriented to gym and equipment including functions, settings, policies, and procedures.  Patient's individual exercise prescription and treatment plan were reviewed.  All starting workloads were established based on the results of the 6 minute walk test done at initial orientation visit.  The plan for exercise progression was also introduced and progression will be customized based on patient's performance and goals.First full day of exercise!  Patient was oriented to gym and equipment including functions, settings, policies, and procedures.  Patient's individual exercise prescription and treatment plan were reviewed.  All starting workloads were  established based on the results of the 6 minute walk test done at initial orientation visit.  The plan for exercise progression was also introduced and progression will be customized based on patient's performance and goals.  [name] [name] did have a small panic attack on the treadmill today.  We talked though it and she recovered and was able to continue  to exercise.  We will work on her treadmill tolerance and fears. [name] [name] is progressing well with exercise and has not had any more panic attacks. [name] [name] has missed sessions due to her granddaughter being very ill. [name] [name] has done well with exercise since returning after a break of 3 + weeks due to family health issues.       Exercise Goals and Review:     Exercise Goals    Row Name 03/31/16 1115             Exercise Goals   Increase Physical Activity --  Home exercise guidelines reviewed       Intervention --       Expected Outcomes --       Increase Strength and Stamina --  adding 1 day/wk of strength training at home       Intervention --       Expected Outcomes --          Exercise Goals Re-Evaluation :     Exercise Goals Re-Evaluation    [place] Name 03/31/16 1120             Exercise Goal Re-Evaluation   Exercise Goals Review Increase Physical Activity;Increase Strenth and Stamina       Comments Reviewed home exercise guildelines        Expected Outcomes Patient will add 1 day of strength training at home and is considering joining [employer] upon graduation.           Discharge Exercise Prescription (Final Exercise Prescription Changes):     Exercise Prescription Changes - 04/03/16 1100      Response to Exercise   Blood Pressure (Admit) 110/68   Blood Pressure (Exercise) 122/74   Blood Pressure (Exit) 104/62   Heart Rate (Admit) 123 bpm   Heart Rate (Exercise) 128 bpm   Heart Rate (Exit) 117 bpm   Oxygen Saturation (Admit) 91 %   Oxygen Saturation (Exercise) 97 %   Oxygen Saturation (Exit) 96 %   Rating of  Perceived Exertion (Exercise) 13   Perceived Dyspnea (Exercise) 3   Duration Progress to 45 minutes of aerobic exercise without signs/symptoms of physical distress   Intensity THRR unchanged     Progression   Progression Continue to progress workloads to maintain intensity without signs/symptoms of physical distress.     Resistance Training   Training Prescription Yes   Weight 3   Reps 10-15     Interval Training   Interval Training No     Oxygen   Oxygen Continuous   Liters 3     NuStep   Level 4   Minutes 15   METs 2.5     REL-XR   Level 1   Minutes 15   METs 2.6      Nutrition:  Target Goals: Understanding of nutrition guidelines, daily intake of sodium <1556m, cholesterol <2047m calories 30% from fat and 7% or less from saturated fats, daily to have 5 or more servings of fruits and vegetables.  Biometrics:     Pre Biometrics - 01/22/16 1546      Pre Biometrics   Height _0  (1.6 m)   Weight 137 lb 12.8 oz (62.5 kg)   Waist Circumference 31 inches   Hip Circumference 38.5 inches   Waist to Hip Ratio 0.81 %   BMI (Calculated) 24.5       Nutrition Therapy Plan and Nutrition Goals:   Nutrition Discharge: Rate Your Plate Scores:  Nutrition Goals Re-Evaluation:   Nutrition Goals Discharge (Final Nutrition Goals Re-Evaluation):   Psychosocial: Target Goals: Acknowledge presence or absence of significant depression and/or stress, maximize coping skills, provide positive support [name]. Participant is able to verbalize types and ability to use techniques and skills needed for reducing stress and depression.   Initial Review & Psychosocial Screening:     Initial Psych Review & Screening - 01/22/16 [place]? Yes   Comments [name] [name] has good family support from her children. She has depression, but not from her COPD and did not offer an explanation. She does go to a support group on Wednesday's. She is very  excited about [facility] and is ready "to make a change".     Barriers   Psychosocial barriers to participate in program The patient should benefit from training in stress management and relaxation.     Screening Interventions   Interventions Encouraged to exercise;Program counselor consult      Quality of Life Scores:     Quality of Life - 01/22/16 1614      Quality of Life Scores   Health/Function Pre 21 %   Socioeconomic Pre 21 %   Psych/Spiritual Pre 21 %   Family Pre 21 %   GLOBAL Pre 21 %      PHQ-9: Recent Review Flowsheet Data    Depression screen [facility] 2/9 01/22/2016   Decreased Interest 2   Down, Depressed, Hopeless 2   PHQ - 2 Score 4   Altered sleeping 3   Tired, decreased energy 3   Change in appetite 0   Feeling bad or failure about yourself  3   Trouble concentrating 1   Moving slowly or fidgety/restless 1   Suicidal thoughts 0   PHQ-9 Score 15   Difficult doing work/chores Very difficult     Interpretation of Total Score  Total Score Depression Severity:  1-4 = Minimal depression, 5-9 = Mild depression, 10-14 = Moderate depression, 15-19 = Moderately severe depression, 20-27 = Severe depression   Psychosocial Evaluation and Intervention:     Psychosocial Evaluation - 03/12/16 1501      Psychosocial Evaluation & Interventions   Comments Counselor met with [name] [name] today for initial psychosocial evaluation.  She is a 56 year old who has been struggling with COPD for 7-8 years and is hoping to get on the lung transplant list.  She lives alone but has (3) adult children and other family members who live close by.  She has been able to improve her sleep recently with a diagnosis of Sleep Apnea and use of a Bi-Pap -with 8-10 hours/night of sleep.  Her appetite is good as well.  [name] [name] reports a history of depression and has been on medication for this for awhile.  She has had recent stressors that have impacted her mood with her PHQ-9 score of 15 indicating  moderate depression.  Counselor processed this with [name] [name] reporting her 65 year old granddaughter was recently diagnosed with Stage 4 brain cancer.  Also, [name]. Sharmaine [name] best friend recently died.  She copes with exercise; leaning on other family members and attends a support group as well as sees a Social worker weekly.  [name] [name] has goals to breathe better and be able to get on the lung transplant list.  Staff will follow with [name] [name] throughout the course of this program.        Psychosocial Re-Evaluation:  Psychosocial Re-Evaluation    Row Name 02/15/16 1120 03/12/16 1501 03/24/16 1031         Psychosocial Re-Evaluation   Comments Marlicia called and said she is sorry that she can not exercise today in Pulmonary Rehab since she has to be out with a family emergency.  [name] [name] on her last visit to [place] stated that her granddaughter was battling brain cancer. This has been very difficult, and [name] [name] will be with her as much as she can to support her through this illness. [name] [name] is relying on her [place] for strength and is praying for a miracle Counselor follow with [name]. Marlon [name] [facility] [name]) today stating it is her first day back in almost a month due to her need to be with family to support during her granddaughter's battle with brain cancer.  [name] [name] states she has not smoked one cigarette through all of this (almost 3 months now) and she is relying on her family and faith to get her through this time.  [name] [name] from [place] is visiting currently and this helps somewhat.  Unfortunately, [name] [name] states she is over eating and over-sleeping during this transition as well.  Counselor commended [name] [name] for using more positive coping strategies and not smoking currently.  Commended her on attending Pulmonary Rehab and taking time for herself.  Also encouraged her to have healthy food options in the home vs. "comfort food" during this time.  Counselor offered supportive services and will  continue to follow with her throughout the course of this program.         Psychosocial Discharge (Final Psychosocial Re-Evaluation):     Psychosocial Re-Evaluation - 03/24/16 1031      Psychosocial Re-Evaluation   Comments Counselor follow with [name]. Marlon [name] [facility] [name]) today stating it is her first day back in almost a month due to her need to be with family to support during her granddaughter's battle with brain cancer.  [name] [name] states she has not smoked one cigarette through all of this (almost 3 months now) and she is relying on her family and faith to get her through this time.  [name] [name] from [place] is visiting currently and this helps somewhat.  Unfortunately, [name] [name] states she is over eating and over-sleeping during this transition as well.  Counselor commended [name] [name] for using more positive coping strategies and not smoking currently.  Commended her on attending Pulmonary Rehab and taking time for herself.  Also encouraged her to have healthy food options in the home vs. "comfort food" during this time.  Counselor offered supportive services and will continue to follow with her throughout the course of this program.       Education: Education Goals: Education classes will be provided on a weekly basis, covering required topics. Participant will state understanding/return demonstration of topics presented.  Learning Barriers/Preferences:     Learning Barriers/Preferences - 01/22/16 1604      Learning Barriers/Preferences   Learning Barriers None   Learning Preferences None      Education Topics: Initial Evaluation Education: - Verbal, written and demonstration of respiratory meds, RPE/PD scales, oximetry and breathing techniques. Instruction on use of nebulizers and MDIs: cleaning and proper use, rinsing mouth with steroid doses and importance of monitoring MDI activations. Flowsheet Row Pulmonary Rehab from 04/02/2016 in [facility] Cardiac and Pulmonary Rehab  Date  01/22/16   Educator  LB  Instruction Review Code  2- meets goals/outcomes      General Nutrition Guidelines/Fats and Fiber: -Group instruction provided by  verbal, written material, models and posters to present the general guidelines for heart healthy nutrition. Gives an explanation and review of dietary fats and fiber.   Controlling Sodium/Reading Food Labels: -Group verbal and written material supporting the discussion of sodium use in heart healthy nutrition. Review and explanation with models, verbal and written materials for utilization of the food label.   Exercise Physiology & Risk Factors: - Group verbal and written instruction with models to review the exercise physiology of the cardiovascular [name] and associated critical values. Details cardiovascular disease risk factors and the goals associated with each risk factor.   Aerobic Exercise & Resistance Training: - Gives group verbal and written discussion on the health impact of inactivity. On the components of aerobic and resistive training programs and the benefits of this training and how to safely progress through these programs.   Flexibility, Balance, General Exercise Guidelines: - Provides group verbal and written instruction on the benefits of flexibility and balance training programs. Provides general exercise guidelines with specific guidelines to those with heart or lung disease. Demonstration and skill practice provided.   Stress Management: - Provides group verbal and written instruction about the health risks of elevated stress, cause of high stress, and healthy ways to reduce stress.   Depression: - Provides group verbal and written instruction on the correlation between heart/lung disease and depressed mood, treatment options, and the stigmas associated with seeking treatment.   Exercise & Equipment Safety: - Individual verbal instruction and demonstration of equipment use and safety with use of the  equipment. Flowsheet Row Pulmonary Rehab from 04/02/2016 in [facility] Cardiac and Pulmonary Rehab  Date  02/01/16  Educator  C. EnterkinRN  Instruction Review Code  2- meets goals/outcomes      Infection Prevention: - Provides verbal and written material to individual with discussion of infection control including proper hand washing and proper equipment cleaning during exercise session.   Falls Prevention: - Provides verbal and written material to individual with discussion of falls prevention and safety.   Diabetes: - Individual verbal and written instruction to review signs/symptoms of diabetes, desired ranges of glucose level fasting, after meals and with exercise. Advice that pre and post exercise glucose checks will be done for 3 sessions at entry of program.   Chronic Lung Diseases: - Group verbal and written instruction to review new updates, new respiratory medications, new advancements in procedures and treatments. Provide informative websites and "800" numbers of self-education.   Lung Procedures: - Group verbal and written instruction to describe testing methods done to diagnose lung disease. Review the outcome of test results. Describe the treatment choices: Pulmonary Function Tests, ABGs and oximetry.   Energy Conservation: - Provide group verbal and written instruction for methods to conserve energy, plan and organize activities. Instruct on pacing techniques, use of adaptive equipment and posture/positioning to relieve shortness of breath. Flowsheet Row Pulmonary Rehab from 04/02/2016 in [facility] Cardiac and Pulmonary Rehab  Date  04/02/16  Educator  [facility]  Instruction Review Code  2- meets goals/outcomes      Triggers: - Group verbal and written instruction to review types of environmental controls: home humidity, furnaces, filters, dust mite/pet prevention, HEPA vacuums. To discuss weather changes, air quality and the benefits of nasal washing. Flowsheet Row Pulmonary Rehab  from 04/02/2016 in [facility] Cardiac and Pulmonary Rehab  Date  01/30/16  Educator  LB  Instruction Review Code  2- meets goals/outcomes      Exacerbations: - Group verbal and written instruction to provide:  warning signs, infection symptoms, calling MD promptly, preventive modes, and value of vaccinations. Review: effective airway clearance, coughing and/or vibration techniques. Create an Sports administrator.   Oxygen: - Individual and group verbal and written instruction on oxygen therapy. Includes supplement oxygen, available portable oxygen systems, continuous and intermittent flow rates, oxygen safety, concentrators, and Medicare reimbursement for oxygen. Flowsheet Row Pulmonary Rehab from 04/02/2016 in [facility] Cardiac and Pulmonary Rehab  Date  01/22/16  Educator  LB  Instruction Review Code  2- meets goals/outcomes      Respiratory Medications: - Group verbal and written instruction to review medications for lung disease. Drug class, frequency, complications, importance of spacers, rinsing mouth after steroid MDI's, and proper cleaning methods for nebulizers. Flowsheet Row Pulmonary Rehab from 04/02/2016 in [facility] Cardiac and Pulmonary Rehab  Date  01/22/16  Educator  LB  Instruction Review Code  2- meets goals/outcomes      AED/CPR: - Group verbal and written instruction with the use of models to demonstrate the basic use of the AED with the basic ABC's of resuscitation.   Breathing Retraining: - Provides individuals verbal and written instruction on purpose, frequency, and proper technique of diaphragmatic breathing and pursed-lipped breathing. Applies individual practice skills. Flowsheet Row Pulmonary Rehab from 04/02/2016 in [facility] Cardiac and Pulmonary Rehab  Date  01/22/16  Educator  LB  Instruction Review Code  2- meets goals/outcomes      Anatomy and Physiology of the Lungs: - Group verbal and written instruction with the use of models to provide basic lung anatomy and physiology  related to function, structure and complications of lung disease.   Heart Failure: - Group verbal and written instruction on the basics of heart failure: signs/symptoms, treatments, explanation of ejection fraction, enlarged heart and cardiomyopathy.   Sleep Apnea: - Individual verbal and written instruction to review Obstructive Sleep Apnea. Review of risk factors, methods for diagnosing and types of masks and machines for OSA.   Anxiety: - Provides group, verbal and written instruction on the correlation between heart/lung disease and anxiety, treatment options, and management of anxiety. Flowsheet Row Pulmonary Rehab from 04/02/2016 in [facility] Cardiac and Pulmonary Rehab  Date  02/13/16  Educator  [facility]  Instruction Review Code  2- Meets goals/outcomes      Relaxation: - Provides group, verbal and written instruction about the benefits of relaxation for patients with heart/lung disease. Also provides patients with examples of relaxation techniques.   Knowledge Questionnaire Score:     Knowledge Questionnaire Score - 01/22/16 1604      Knowledge Questionnaire Score   Pre Score 7/10       Core Components/Risk Factors/Patient Goals at Admission:     Personal Goals and Risk Factors at Admission - 01/22/16 1608      Core Components/Risk Factors/Patient Goals on Admission    Weight Management Yes  Plans to meet with the dietitian; has gained 17lbs - steroids have increased her appetite   Intervention Weight Management: Develop a combined nutrition and exercise program designed to reach desired caloric intake, while maintaining appropriate intake of nutrient and fiber, sodium and fats, and appropriate energy expenditure required for the weight goal.;Weight Management: Provide education and appropriate resources to help participant work on and attain dietary goals.;Weight Management/Obesity: Establish reasonable short term and long term weight goals.   Admit Weight 137 lb 12.8 oz (62.5  kg)   Goal Weight: Short Term 132 lb (59.9 kg)   Goal Weight: Long Term 120 lb (54.4 kg)   Expected  Outcomes Short Term: Continue to assess and modify interventions until short term weight is achieved;Long Term: Adherence to nutrition and physical activity/exercise program aimed toward attainment of established weight goal;Weight Maintenance: Understanding of the daily nutrition guidelines, which includes 25-35% calories from fat, 7% or less cal from saturated fats, less than 271m cholesterol, less than 1.5gm of sodium, & 5 or more servings of fruits and vegetables daily;Weight Loss: Understanding of general recommendations for a balanced deficit meal plan, which promotes 1-2 lb weight loss per week and includes a negative energy balance of [phone] kcal/d;Understanding recommendations for meals to include 15-35% energy as protein, 25-35% energy from fat, 35-60% energy from carbohydrates, less than 2059mof dietary cholesterol, 20-35 gm of total fiber daily;Understanding of distribution of calorie intake throughout the day with the consumption of 4-5 meals/snacks   Sedentary Yes  Preparing for possible Lung Transplant surgery   Intervention Provide advice, education, support and counseling about physical activity/exercise needs.;Develop an individualized exercise prescription for aerobic and resistive training based on initial evaluation findings, risk stratification, comorbidities and participant's personal goals.   Expected Outcomes Achievement of increased cardiorespiratory fitness and enhanced flexibility, muscular endurance and strength shown through measurements of functional capacity and personal statement of participant.   Increase Strength and Stamina Yes   Intervention Provide advice, education, support and counseling about physical activity/exercise needs.;Develop an individualized exercise prescription for aerobic and resistive training based on initial evaluation findings, risk stratification,  comorbidities and participant's personal goals.   Expected Outcomes Achievement of increased cardiorespiratory fitness and enhanced flexibility, muscular endurance and strength shown through measurements of functional capacity and personal statement of participant.   Improve shortness of breath with ADL's Yes   Intervention Provide education, individualized exercise plan and daily activity instruction to help decrease symptoms of SOB with activities of daily living.   Expected Outcomes Short Term: Achieves a reduction of symptoms when performing activities of daily living.   Develop more efficient breathing techniques such as purse lipped breathing and diaphragmatic breathing; and practicing self-pacing with activity Yes   Intervention Provide education, demonstration and support about specific breathing techniuqes utilized for more efficient breathing. Include techniques such as pursed lipped breathing, diaphragmatic breathing and self-pacing activity.   Expected Outcomes Short Term: Participant will be able to demonstrate and use breathing techniques as needed throughout daily activities.   Increase knowledge of respiratory medications and ability to use respiratory devices properly  Yes  Duoneb SVN, ProAir with spacer, Dulero, Spiriva; 2l/m oxygen; BIPAP   Intervention Provide education and demonstration as needed of appropriate use of medications, inhalers, and oxygen therapy.   Expected Outcomes Short Term: Achieves understanding of medications use. Understands that oxygen is a medication prescribed by physician. Demonstrates appropriate use of inhaler and oxygen therapy.      Core Components/Risk Factors/Patient Goals Review:      Goals and Risk Factor Review    Row Name 01/30/16 1350 02/11/16 1053 03/24/16 1557         Core Components/Risk Factors/Patient Goals Review   Personal Goals Review Develop more efficient breathing techniques such as purse lipped breathing and diaphragmatic  breathing and practicing self-pacing with activity. Develop more efficient breathing techniques such as purse lipped breathing and diaphragmatic breathing and practicing self-pacing with activity.;Improve shortness of breath with ADL's;Weight Management/Obesity Weight Management/Obesity;Sedentary;Increase Strength and Stamina;Improve shortness of breath with ADL's;Develop more efficient breathing techniques such as purse lipped breathing and diaphragmatic breathing and practicing self-pacing with activity.;Increase knowledge of respiratory medications and ability to  use respiratory devices properly.     Review Reviewed pursed lip breathing technique with [name] [name].  She has used it in the past, but still needs reminding to use during exercise. Patient stated that she has been using PBL and has noticed improvements in SOB during ADL's. She stated she would be interesed in scheduleing an appointment with the dietician. She was given information about making this appointment.  [name] Dadisman has attended irregularly due to the illness of her granddaughter who has cancer. She has progressed with her exercise goals.  [name] Godino recently had another sleep study and will be getting a new CPAP machine. Wearing the mask does make a difference in her energy level. She has a good understanding of her MDI's and uses her spacer. She plans to meet with the dietitian in the future.     Expected Outcomes Short Term:  [name] [name] will become more proficient at using pursed lip breathing.  Long Term: [name] [name] will be independent in her remembering to use her pursed lip breathing. Patient will continue to use PLB to aid in contorl of SOB. Patient will make an appointment to meet with the dieticain. Continue exercising and continue participating in [place] education as she is able with her granddaughter.        Core Components/Risk Factors/Patient Goals at Discharge (Final Review):      Goals and Risk Factor Review - 03/24/16 1557       Core Components/Risk Factors/Patient Goals Review   Personal Goals Review Weight Management/Obesity;Sedentary;Increase Strength and Stamina;Improve shortness of breath with ADL's;Develop more efficient breathing techniques such as purse lipped breathing and diaphragmatic breathing and practicing self-pacing with activity.;Increase knowledge of respiratory medications and ability to use respiratory devices properly.   Review [name] Fotopoulos has attended irregularly due to the illness of her granddaughter who has cancer. She has progressed with her exercise goals.  [name] Bhargava recently had another sleep study and will be getting a new CPAP machine. Wearing the mask does make a difference in her energy level. She has a good understanding of her MDI's and uses her spacer. She plans to meet with the dietitian in the future.   Expected Outcomes Continue exercising and continue participating in [place] education as she is able with her granddaughter.      ITP Comments:     ITP Comments    Row Name 02/26/16 0732 03/12/16 1459         ITP Comments [name] Templeman states she may be out several sessions. Her grand daughter has stage 4 brain cancer, and [name] [name] helps with her care, as well as spends time with her much as possible. [name] Babineaux left a message with the front desk that she has been sick and expects to be back to [facility] this week.          Comments:30 Day Note Review

## 2016-04-21 ENCOUNTER — Encounter: Payer: Self-pay | Admitting: Internal Medicine

## 2016-04-21 DIAGNOSIS — J449 Chronic obstructive pulmonary disease, unspecified: Secondary | ICD-10-CM

## 2016-04-24 ENCOUNTER — Other Ambulatory Visit: Payer: Self-pay | Admitting: Internal Medicine

## 2016-04-24 DIAGNOSIS — F411 Generalized anxiety disorder: Secondary | ICD-10-CM

## 2016-04-25 ENCOUNTER — Encounter: Payer: Medicare Other | Admitting: *Deleted

## 2016-04-25 DIAGNOSIS — J449 Chronic obstructive pulmonary disease, unspecified: Secondary | ICD-10-CM

## 2016-04-25 DIAGNOSIS — J9621 Acute and chronic respiratory failure with hypoxia: Secondary | ICD-10-CM | POA: Diagnosis not present

## 2016-04-25 NOTE — Progress Notes (Signed)
Daily Session Note  Patient Details  Name: [name] [name] MRN: [id number] Date of Birth: 06/06/1960 Referring Provider:    Encounter Date: 04/25/2016  Check In:     Session Check In - 04/25/16 1105      Check-In   Location [facility]-Cardiac & Pulmonary Rehab   Staff Present Nyoka Cowden, RN, BSN, Walden Field, BS, RRT, Respiratory Therapist;Other  Darel Hong RN BSN   Supervising physician immediately available to respond to emergencies [facility] immediately available ER MD   Physician(s) Drs. Alfred Levins and [place]lin   Medication changes reported     No   Fall or balance concerns reported    No   Tobacco Cessation No Change   Warm-up and Cool-down Performed as group-led Location manager Performed Yes   VAD Patient? No     Pain Assessment   Currently in Pain? No/denies   Multiple Pain Sites No         History  Smoking Status  . Former Smoker  . Packs/day: 0.50  . Years: 40.00  Smokeless Tobacco  . Never Used    Comment: quit smoking 09/26/15    Goals Met:  Proper associated with RPD/PD & O2 Sat Independence with exercise equipment Using PLB without cueing & demonstrates good technique Exercise tolerated well Strength training completed today  Goals Unmet:  Not Applicable  Comments: Pt able to follow exercise prescription today without complaint.  Will continue to monitor for progression.    Dr. Emily Filbert is Medical Director for [place] and [facility].

## 2016-05-05 ENCOUNTER — Encounter: Payer: Medicare Other | Attending: Pulmonary Disease

## 2016-05-05 DIAGNOSIS — J9621 Acute and chronic respiratory failure with hypoxia: Secondary | ICD-10-CM | POA: Insufficient documentation

## 2016-05-07 ENCOUNTER — Encounter: Payer: Self-pay | Admitting: Respiratory Therapy

## 2016-05-07 DIAGNOSIS — J449 Chronic obstructive pulmonary disease, unspecified: Secondary | ICD-10-CM

## 2016-05-12 ENCOUNTER — Encounter: Payer: Self-pay | Admitting: Respiratory Therapy

## 2016-05-12 DIAGNOSIS — J449 Chronic obstructive pulmonary disease, unspecified: Secondary | ICD-10-CM

## 2016-05-12 NOTE — Progress Notes (Signed)
Pulmonary Individual Treatment Plan  Patient Details  Name: [name] [name] MRN: [id number] Date of Birth: November 02, 1960 Referring Provider:    Initial Encounter Date:    Pulmonary Rehab from 01/22/2016 in [facility] Cardiac and Pulmonary Rehab  Date  01/22/16      Visit Diagnosis: COPD, severe ([place])  Patient's Home Medications on Admission:  Current Outpatient Prescriptions:    albuterol (PROAIR HFA) 108 (90 [name]) MCG/ACT inhaler, INHALE 2 PUFFS BY MOUTH EVERY 6 HOURS AS NEEDED FOR WHEEZING OR SHORTNESS OF BREATH., Disp: 3 Inhaler, Rfl: 0   ALPRAZolam (XANAX) 1 MG tablet, TAKE 1/2 TABLET BY MOUTH THREE TIMES DAILY AS NEEDED FOR ANXIETY, Disp: 30 tablet, Rfl: 5   aspirin 81 MG EC tablet, Take 1 tablet (81 mg total) by mouth daily., Disp: 30 tablet, Rfl: 1   cetirizine (ZYRTEC ALLERGY) 10 MG tablet, Take 1 tablet (10 mg total) by mouth daily., Disp: 90 tablet, Rfl: 3   fluticasone (FLONASE) 50 MCG/ACT nasal spray, Place 1 spray into both nostrils daily., Disp: 1 g, Rfl: 5   guaiFENesin (MUCINEX) 600 MG 12 hr tablet, Take 1 tablet (600 mg total) by mouth 2 (two) times daily as needed for cough or to loosen phlegm., Disp: 20 tablet, Rfl: 0   ipratropium-albuterol (DUONEB) 0.5-2.5 (3) MG/3ML SOLN, Take 3 mLs by nebulization every 4 (four) hours as needed (for wheezing/shortness of breath)., Disp: 360 mL, Rfl: 2   mometasone-formoterol (DULERA) 200-5 MCG/ACT AERO, Inhale 2 puffs into the lungs 2 (two) times daily., Disp: 3 Inhaler, Rfl: 3   predniSONE (DELTASONE) 5 MG tablet, Take 1 tablet (5 mg total) by mouth daily with breakfast., Disp: 30 tablet, Rfl: 5   QUEtiapine (SEROQUEL) 50 MG tablet, Take 1 tablet (50 mg total) by mouth at bedtime., Disp: 30 tablet, Rfl: 0   tiotropium (SPIRIVA) 18 MCG inhalation capsule, Place 1 capsule (18 mcg total) into inhaler and inhale daily., Disp: 90 capsule, Rfl: 3  Past Medical History: Past Medical History:  Diagnosis Date   Breast cancer  ([place])    Cancer ([place])    COPD (chronic obstructive pulmonary disease) ([place])     Tobacco Use: History  Smoking Status   Former Smoker   Packs/day: 0.50   Years: 40.00  Smokeless Tobacco   Never Used    Comment: quit smoking 09/26/15    Labs: Recent Review Flowsheet Data    Labs for ITP Cardiac and Pulmonary Rehab Latest Ref Rng & Units 05/06/2015 05/08/2015 10/31/2015 10/31/2015 10/31/2015   Cholestrol 0 - 200 mg/dL - - - - -   LDLCALC 0 - 100 mg/dL - - - - -   HDL 40 - 60 mg/dL - - - - -   Trlycerides 0 - 200 mg/dL - - - - -   Hemoglobin A1c 4.8 - 5.6 % - - 5.0 - -   PHART 7.350 - 7.450 7.31(L) 7.36 - - -   PCO2ART 32.0 - 48.0 mmHg 57(H) 66(H) - - -   HCO3 20.0 - 28.0 mmol/L 28.7(H) 37.3(H) - 36.1(H) 35.9(H)   O2SAT % 96.3 96.3 - 98.2 97.4       ADL UCSD:     Pulmonary Assessment Scores    Row Name 01/22/16 1606         ADL UCSD   ADL Phase Entry     SOB Score total 81     Rest 0     Walk 3     Stairs 5  Bath 4     Dress 3     Shop 4        Pulmonary Function Assessment:     Pulmonary Function Assessment - 01/22/16 1604      Pulmonary Function Tests   RV% 94 %   DLCO% 37 %     Initial Spirometry Results   FVC% 47 %   FEV1% 28 %   FEV1/FVC Ratio 48   Comments Test date      Post Bronchodilator Spirometry Results   FVC% 44 %   FEV1% 27 %   FEV1/FVC Ratio 49     Breath   Shortness of Breath Yes;Limiting activity;Fear of Shortness of Breath      Exercise Target Goals:    Exercise Program Goal: Individual exercise prescription set with THRR, safety & activity barriers. Participant demonstrates ability to understand and report RPE using BORG scale, to self-measure pulse accurately, and to acknowledge the importance of the exercise prescription.  Exercise Prescription Goal: Starting with aerobic activity 30 plus minutes a day, 3 days per week for initial exercise prescription. Provide home exercise prescription and guidelines that  participant acknowledges understanding prior to discharge.  Activity Barriers & Risk Stratification:     Activity Barriers & Cardiac Risk Stratification - 01/22/16 1604      Activity Barriers & Cardiac Risk Stratification   Activity Barriers Shortness of Breath;Deconditioning;Muscular Weakness   Cardiac Risk Stratification Moderate      6 Minute Walk:     6 Minute Walk    Row Name 01/22/16 1546         6 Minute Walk   Distance 990 feet     Walk Time 6 minutes     # of Rest Breaks 0     MPH 1.88     METS 3.19     RPE 11     Perceived Dyspnea  3     VO2 Peak 11.18     Symptoms No     Resting HR 110 bpm     Resting BP 108/78     Max Ex. HR 127 bpm     Max Ex. BP 108/60       Interval HR   Baseline HR 110     1 Minute HR 113     2 Minute HR 123     3 Minute HR 127     4 Minute HR 126     5 Minute HR 125     6 Minute HR 124     Interval Heart Rate? Yes       Interval Oxygen   Interval Oxygen? Yes     Baseline Oxygen Saturation % 92 %     Baseline Liters of Oxygen 2 L     1 Minute Oxygen Saturation % 91 %     1 Minute Liters of Oxygen 2 L     2 Minute Oxygen Saturation % 94 %     2 Minute Liters of Oxygen 2 L     3 Minute Oxygen Saturation % 95 %     3 Minute Liters of Oxygen 2 L     4 Minute Oxygen Saturation % 95 %     4 Minute Liters of Oxygen 2 L     5 Minute Oxygen Saturation % 94 %     5 Minute Liters of Oxygen 2 L     6 Minute Oxygen Saturation % 93 %     6  Minute Liters of Oxygen 2 L     2 Minute Post Liters of Oxygen 2 L       Oxygen Initial Assessment:     Oxygen Initial Assessment - 04/14/16 0815      Home Oxygen   Home Oxygen Device Portable Concentrator;Home Concentrator   Sleep Oxygen Prescription Continuous   Liters per minute --  2-5   Home Exercise Oxygen Prescription Continuous   Liters per minute --  2-5   Home at Rest Exercise Oxygen Prescription Continuous   Liters per minute --  2-5   Compliance with Home Oxygen Use Yes      Initial 6 min Walk   Oxygen Used Continuous   Liters per minute 2   Resting Oxygen Saturation  during 6 min walk 92 %   Exercise Oxygen Saturation  during 6 min walk 89 %     Program Oxygen Prescription   Program Oxygen Prescription Continuous   Liters per minute --  2-4     Intervention   Short Term Goals To learn and exhibit compliance with exercise, home and travel O2 prescription;To Learn and understand importance of maintaining oxygen saturations>88%;To learn and demonstrate proper use of respiratory medications;To learn and understand importance of monitoring SPO2 with pulse oximeter and demonstrate accurate use of the pulse oximeter.;To learn and demonstrate proper purse lipped breathing techniques or other breathing techniques.   Long  Term Goals Exhibits compliance with exercise, home and travel O2 prescription;Maintenance of O2 saturations>88%;Compliance with respiratory medication;Demonstrates proper use of MDIs;Exhibits proper breathing techniques, such as purse lipped breathing or other method taught during program session;Verbalizes importance of monitoring SPO2 with pulse oximeter and return demonstration      Oxygen Re-Evaluation:     Oxygen Re-Evaluation    Row Name 04/02/16 1516             Goals/Expected Outcomes   Comments [name] [name] has been walking into [facility] with out her oxygen. We checked her O2Sat and it was 90%. I suggested she bring her oximeter and check her O2Sat's as she comes into [facility].I educated her on the importance of O2Sats in the 9()%'s for the health of her lungs and heart.          Oxygen Discharge (Final Oxygen Re-Evaluation):     Oxygen Re-Evaluation - 04/02/16 1516      Goals/Expected Outcomes   Comments [name] [name] has been walking into [facility] with out her oxygen. We checked her O2Sat and it was 90%. I suggested she bring her oximeter and check her O2Sat's as she comes into [facility].I educated her on the  importance of O2Sats in the 9()%'s for the health of her lungs and heart.      Initial Exercise Prescription:     Initial Exercise Prescription - 01/22/16 1500      Date of Initial Exercise RX and Referring Provider   Date 01/22/16     Oxygen   Oxygen Continuous   Liters 2     Treadmill   MPH 1.8   Grade 0.5   Minutes 15     NuStep   Level 3   Minutes 15   METs 3     REL-XR   Level 2   Minutes 15   METs 3     Prescription Details   Frequency (times per week) 3   Duration Progress to 45 minutes of aerobic exercise without signs/symptoms of physical distress     Intensity   THRR 40-80%  of Max Heartrate 131-152   Ratings of Perceived Exertion 11-13   Perceived Dyspnea 0-4     Progression   Progression Continue to progress workloads to maintain intensity without signs/symptoms of physical distress.     Resistance Training   Training Prescription Yes   Weight 3      Perform Capillary Blood Glucose checks as needed.  Exercise Prescription Changes:     Exercise Prescription Changes    Row Name 01/30/16 1300 02/07/16 1200 02/22/16 0800 03/07/16 0900 03/31/16 1100     Response to Exercise   Blood Pressure (Admit) 110/68 108/60 116/78 124/60  --   Blood Pressure (Exercise) 126/70 128/62 126/60 108/68  --   Blood Pressure (Exit) 100/60 118/70 122/70 120/70  --   Heart Rate (Admit) 87 bpm 102 bpm 122 bpm 97 bpm  --   Heart Rate (Exercise) 118 bpm 133 bpm 138 bpm 134 bpm  --   Heart Rate (Exit) 100 bpm 108 bpm 106 bpm 123 bpm  --   Oxygen Saturation (Admit) 91 % 95 % 97 % 90 %  --   Oxygen Saturation (Exercise) 93 % 96 % 97 % 99 %  --   Oxygen Saturation (Exit) 98 % 97 % 98 % 98 %  --   Rating of Perceived Exertion (Exercise) _0 decreased speed on TM  --   Perceived Dyspnea (Exercise) _1 --   Symptoms panic attack on treadmill  --  --  --  --   Duration Progress to 45 minutes of aerobic exercise without signs/symptoms of physical distress  Progress to 45 minutes of aerobic exercise without signs/symptoms of physical distress Progress to 45 minutes of aerobic exercise without signs/symptoms of physical distress Progress to 45 minutes of aerobic exercise without signs/symptoms of physical distress Progress to 45 minutes of aerobic exercise without signs/symptoms of physical distress   Intensity _2      Progression   Progression Continue to progress workloads to maintain intensity without signs/symptoms of physical distress. Continue to progress workloads to maintain intensity without signs/symptoms of physical distress. Continue to progress workloads to maintain intensity without signs/symptoms of physical distress. Continue to progress workloads to maintain intensity without signs/symptoms of physical distress. Continue to progress workloads to maintain intensity without signs/symptoms of physical distress.   Average METs 2.23  --  --  --  --     Horticulturist, commercial Prescription  -- Yes Yes Yes Yes   Weight  -- _3 Reps  -- 10-15 10-15 10-15 10-15     Interval Training   Interval Training  -- No No No No     Oxygen   Oxygen  -- Continuous Continuous Continuous Continuous   Liters  -- _4 Treadmill   MPH  -- 1.8  -- 1.5 1.5   Grade  -- 0.5  -- 0.5 0.5   Minutes  -- 15  -- 15 15     NuStep   Level  -- 3  -- 3 3   Minutes  -- 15  -- 15 15   METs  -- 1.7  -- 2.1 2.1     REL-XR   Level  -- 3  --  --  --   Minutes  -- 15  --  --  --   METs  -- 1.8  --  --  --  Home Exercise Plan   Plans to continue exercise at  --  --  --  -- Home (comment)  considering Financial [employer] upon graduation of program   Frequency  --  --  --  -- Add 1 additional day to program exercise sessions.   Initial Home Exercises Provided  --  --  --  -- 03/31/16     Exercise Review   Progression --  First Full Day of Exercise  --  --  --  --   Row Name  04/03/16 1100 05/01/16 1000           Response to Exercise   Blood Pressure (Admit) 110/68 108/60      Blood Pressure (Exercise) 122/74 138/42      Blood Pressure (Exit) 104/62 104/64      Heart Rate (Admit) 123 bpm 107 bpm      Heart Rate (Exercise) 128 bpm 120 bpm      Heart Rate (Exit) 117 bpm 102 bpm      Oxygen Saturation (Admit) 91 % 98 %      Oxygen Saturation (Exercise) 97 % 90 %      Oxygen Saturation (Exit) 96 % 97 %      Rating of Perceived Exertion (Exercise) 13 13      Perceived Dyspnea (Exercise) 3 3      Duration Progress to 45 minutes of aerobic exercise without signs/symptoms of physical distress Progress to 45 minutes of aerobic exercise without signs/symptoms of physical distress      Intensity THRR unchanged THRR unchanged        Progression   Progression Continue to progress workloads to maintain intensity without signs/symptoms of physical distress. Continue to progress workloads to maintain intensity without signs/symptoms of physical distress.        Resistance Training   Training Prescription Yes Yes      Weight 3 3      Reps 10-15 10-15        Interval Training   Interval Training No No        Oxygen   Oxygen Continuous Continuous      Liters 3 3        NuStep   Level 4 5      Minutes 15 15      METs 2.5  --        REL-XR   Level 1 1      Minutes 15 15      METs 2.6 3         Exercise Comments:     Exercise Comments    Row Name 01/30/16 1352 02/22/16 0906 03/07/16 0923 04/03/16 1133 04/18/16 1213   Exercise Comments First full day of exercise!  Patient was oriented to gym and equipment including functions, settings, policies, and procedures.  Patient's individual exercise prescription and treatment plan were reviewed.  All starting workloads were established based on the results of the 6 minute walk test done at initial orientation visit.  The plan for exercise progression was also introduced and progression will be customized based on  patient's performance and goals.First full day of exercise!  Patient was oriented to gym and equipment including functions, settings, policies, and procedures.  Patient's individual exercise prescription and treatment plan were reviewed.  All starting workloads were established based on the results of the 6 minute walk test done at initial orientation visit.  The plan for exercise progression was also introduced and progression will be customized based  on patient's performance and goals.  [name] [name] did have a small panic attack on the treadmill today.  We talked though it and she recovered and was able to continue to exercise.  We will work on her treadmill tolerance and fears. [name] [name] is progressing well with exercise and has not had any more panic attacks. [name] [name] has missed sessions due to her granddaughter being very ill. [name] [name] has done well with exercise since returning after a break of 3 + weeks due to family health issues. [name] [name] has not attended since 04/02/16.   [place] Name 05/01/16 1105           Exercise Comments [name] [name] tolerates exercise well when she attends.  She has some family conflicts that interfere with regular attendance.          Exercise Goals and Review:     Exercise Goals    Row Name 03/31/16 1115             Exercise Goals   Increase Physical Activity --  Home exercise guidelines reviewed       Intervention --       Expected Outcomes --       Increase Strength and Stamina --  adding 1 day/wk of strength training at home       Intervention --       Expected Outcomes --          Exercise Goals Re-Evaluation :     Exercise Goals Re-Evaluation    [name] [name] Name 03/31/16 1120             Exercise Goal Re-Evaluation   Exercise Goals Review Increase Physical Activity;Increase Strenth and Stamina       Comments Reviewed home exercise guildelines        Expected Outcomes Patient will add 1 day of strength training at home and is considering joining [employer] upon  graduation.           Discharge Exercise Prescription (Final Exercise Prescription Changes):     Exercise Prescription Changes - 05/01/16 1000      Response to Exercise   Blood Pressure (Admit) 108/60   Blood Pressure (Exercise) 138/42   Blood Pressure (Exit) 104/64   Heart Rate (Admit) 107 bpm   Heart Rate (Exercise) 120 bpm   Heart Rate (Exit) 102 bpm   Oxygen Saturation (Admit) 98 %   Oxygen Saturation (Exercise) 90 %   Oxygen Saturation (Exit) 97 %   Rating of Perceived Exertion (Exercise) 13   Perceived Dyspnea (Exercise) 3   Duration Progress to 45 minutes of aerobic exercise without signs/symptoms of physical distress   Intensity THRR unchanged     Progression   Progression Continue to progress workloads to maintain intensity without signs/symptoms of physical distress.     Resistance Training   Training Prescription Yes   Weight 3   Reps 10-15     Interval Training   Interval Training No     Oxygen   Oxygen Continuous   Liters 3     NuStep   Level 5   Minutes 15     REL-XR   Level 1   Minutes 15   METs 3      Nutrition:  Target Goals: Understanding of nutrition guidelines, daily intake of sodium <1558m, cholesterol <2040m calories 30% from fat and 7% or less from saturated fats, daily to have 5 or more servings of fruits and vegetables.  Biometrics:     Pre Biometrics - 01/22/16  1546      Pre Biometrics   Height _0  (1.6 m)   Weight 137 lb 12.8 oz (62.5 kg)   Waist Circumference 31 inches   Hip Circumference 38.5 inches   Waist to Hip Ratio 0.81 %   BMI (Calculated) 24.5       Nutrition Therapy Plan and Nutrition Goals:   Nutrition Discharge: Rate Your Plate Scores:   Nutrition Goals Re-Evaluation:   Nutrition Goals Discharge (Final Nutrition Goals Re-Evaluation):   Psychosocial: Target Goals: Acknowledge presence or absence of significant depression and/or stress, maximize coping skills, provide positive support system.  Participant is able to verbalize types and ability to use techniques and skills needed for reducing stress and depression.   Initial Review & Psychosocial Screening:     Initial Psych Review & Screening - 01/22/16 [place]? Yes   Comments [name] [name] has good family support from her children. She has depression, but not from her COPD and did not offer an explanation. She does go to a support group on Wednesday's. She is very excited about [facility] and is ready "to make a change".     Barriers   Psychosocial barriers to participate in program The patient should benefit from training in stress management and relaxation.     Screening Interventions   Interventions Encouraged to exercise;Program counselor consult      Quality of Life Scores:     Quality of Life - 01/22/16 1614      Quality of Life Scores   Health/Function Pre 21 %   Socioeconomic Pre 21 %   Psych/Spiritual Pre 21 %   Family Pre 21 %   GLOBAL Pre 21 %      PHQ-9: Recent Review Flowsheet Data    Depression screen [facility] 2/9 01/22/2016   Decreased Interest 2   Down, Depressed, Hopeless 2   PHQ - 2 Score 4   Altered sleeping 3   Tired, decreased energy 3   Change in appetite 0   Feeling bad or failure about yourself  3   Trouble concentrating 1   Moving slowly or fidgety/restless 1   Suicidal thoughts 0   PHQ-9 Score 15   Difficult doing work/chores Very difficult     Interpretation of Total Score  Total Score Depression Severity:  1-4 = Minimal depression, 5-9 = Mild depression, 10-14 = Moderate depression, 15-19 = Moderately severe depression, 20-27 = Severe depression   Psychosocial Evaluation and Intervention:     Psychosocial Evaluation - 03/12/16 1501      Psychosocial Evaluation & Interventions   Comments Counselor met with [name] [name] today for initial psychosocial evaluation.  She is a 56 year old who has been struggling with COPD for 7-8 years and is hoping  to get on the lung transplant list.  She lives alone but has (3) adult children and other family members who live close by.  She has been able to improve her sleep recently with a diagnosis of Sleep Apnea and use of a Bi-Pap -with 8-10 hours/night of sleep.  Her appetite is good as well.  [name] [name] reports a history of depression and has been on medication for this for awhile.  She has had recent stressors that have impacted her mood with her PHQ-9 score of 15 indicating moderate depression.  Counselor processed this with [name] [name] reporting her 29 year old granddaughter was recently diagnosed with Stage 4 brain cancer.  Also, [name]. Sharmaine [name] best friend recently died.  She copes with exercise; leaning on other family members and attends a support group as well as sees a Social worker weekly.  [name] [name] has goals to breathe better and be able to get on the lung transplant list.  Staff will follow with [name] [name] throughout the course of this program.        Psychosocial Re-Evaluation:     Psychosocial Re-Evaluation    [place] Name 02/15/16 1120 03/12/16 1501 03/24/16 1031         Psychosocial Re-Evaluation   Comments [name] [name] called and said she is sorry that she can not exercise today in Pulmonary Rehab since she has to be out with a family emergency.  [name] [name] on her last visit to [place] stated that her granddaughter was battling brain cancer. This has been very difficult, and [name] [name] will be with her as much as she can to support her through this illness. [name] [name] is relying on her [place] for strength and is praying for a miracle Counselor follow with [name]. Marlon [name] [facility]) today stating it is her first day back in almost a month due to her need to be with family to support during her granddaughter's battle with brain cancer.  [name] [name] states she has not smoked one cigarette through all of this (almost 3 months now) and she is relying on her family and faith to get her through this time.  [name] [name]'s sister from  [place] is visiting currently and this helps somewhat.  Unfortunately, [name] [name] states she is over eating and over-sleeping during this transition as well.  Counselor commended [employer] for using more positive coping strategies and not smoking currently.  Commended her on attending Pulmonary Rehab and taking time for herself.  Also encouraged her to have healthy food options in the home vs. "comfort food" during this time.  Counselor offered supportive services and will continue to follow with her throughout the course of this program.         Psychosocial Discharge (Final Psychosocial Re-Evaluation):     Psychosocial Re-Evaluation - 03/24/16 1031      Psychosocial Re-Evaluation   Comments Counselor follow with [name]. Marlon [name] [facility]) today stating it is her first day back in almost a month due to her need to be with family to support during her granddaughter's battle with brain cancer.  [name] [name] states she has not smoked one cigarette through all of this (almost 3 months now) and she is relying on her family and faith to get her through this time.  [name] [name]'s sister from [place] is visiting currently and this helps somewhat.  Unfortunately, [name] [name] states she is over eating and over-sleeping during this transition as well.  Counselor commended [employer] for using more positive coping strategies and not smoking currently.  Commended her on attending Pulmonary Rehab and taking time for herself.  Also encouraged her to have healthy food options in the home vs. "comfort food" during this time.  Counselor offered supportive services and will continue to follow with her throughout the course of this program.       Education: Education Goals: Education classes will be provided on a weekly basis, covering required topics. Participant will state understanding/return demonstration of topics presented.  Learning Barriers/Preferences:     Learning Barriers/Preferences - 01/22/16 1604      Learning Barriers/Preferences    Learning Barriers None   Learning Preferences None      Education Topics: Initial Evaluation Education: - Verbal, written and demonstration  of respiratory meds, RPE/PD scales, oximetry and breathing techniques. Instruction on use of nebulizers and MDIs: cleaning and proper use, rinsing mouth with steroid doses and importance of monitoring MDI activations.   Pulmonary Rehab from 04/02/2016 in [facility] Cardiac and Pulmonary Rehab  Date  01/22/16  Educator  LB  Instruction Review Code  2- meets goals/outcomes      General Nutrition Guidelines/Fats and Fiber: -Group instruction provided by verbal, written material, models and posters to present the general guidelines for heart healthy nutrition. Gives an explanation and review of dietary fats and fiber.   Controlling Sodium/Reading Food Labels: -Group verbal and written material supporting the discussion of sodium use in heart healthy nutrition. Review and explanation with models, verbal and written materials for utilization of the food label.   Exercise Physiology & Risk Factors: - Group verbal and written instruction with models to review the exercise physiology of the cardiovascular system and associated critical values. Details cardiovascular disease risk factors and the goals associated with each risk factor.   Aerobic Exercise & Resistance Training: - Gives group verbal and written discussion on the health impact of inactivity. On the components of aerobic and resistive training programs and the benefits of this training and how to safely progress through these programs.   Flexibility, Balance, General Exercise Guidelines: - Provides group verbal and written instruction on the benefits of flexibility and balance training programs. Provides general exercise guidelines with specific guidelines to those with heart or lung disease. Demonstration and skill practice provided.   Stress Management: - Provides group verbal and written  instruction about the health risks of elevated stress, cause of high stress, and healthy ways to reduce stress.   Depression: - Provides group verbal and written instruction on the correlation between heart/lung disease and depressed mood, treatment options, and the stigmas associated with seeking treatment.   Exercise & Equipment Safety: - Individual verbal instruction and demonstration of equipment use and safety with use of the equipment.   Pulmonary Rehab from 04/02/2016 in [facility] Cardiac and Pulmonary Rehab  Date  02/01/16  Educator  C. EnterkinRN  Instruction Review Code  2- meets goals/outcomes      Infection Prevention: - Provides verbal and written material to individual with discussion of infection control including proper hand washing and proper equipment cleaning during exercise session.   Falls Prevention: - Provides verbal and written material to individual with discussion of falls prevention and safety.   Diabetes: - Individual verbal and written instruction to review signs/symptoms of diabetes, desired ranges of glucose level fasting, after meals and with exercise. Advice that pre and post exercise glucose checks will be done for 3 sessions at entry of program.   Chronic Lung Diseases: - Group verbal and written instruction to review new updates, new respiratory medications, new advancements in procedures and treatments. Provide informative websites and "800" numbers of self-education.   Lung Procedures: - Group verbal and written instruction to describe testing methods done to diagnose lung disease. Review the outcome of test results. Describe the treatment choices: Pulmonary Function Tests, ABGs and oximetry.   Energy Conservation: - Provide group verbal and written instruction for methods to conserve energy, plan and organize activities. Instruct on pacing techniques, use of adaptive equipment and posture/positioning to relieve shortness of breath.   Pulmonary  Rehab from 04/02/2016 in [facility] Cardiac and Pulmonary Rehab  Date  04/02/16  Educator  [facility]  Instruction Review Code  2- meets goals/outcomes      Triggers: - Group  verbal and written instruction to review types of environmental controls: home humidity, furnaces, filters, dust mite/pet prevention, HEPA vacuums. To discuss weather changes, air quality and the benefits of nasal washing.   Pulmonary Rehab from 04/02/2016 in [facility] Cardiac and Pulmonary Rehab  Date  01/30/16  Educator  LB  Instruction Review Code  2- meets goals/outcomes      Exacerbations: - Group verbal and written instruction to provide: warning signs, infection symptoms, calling MD promptly, preventive modes, and value of vaccinations. Review: effective airway clearance, coughing and/or vibration techniques. Create an Sports administrator.   Oxygen: - Individual and group verbal and written instruction on oxygen therapy. Includes supplement oxygen, available portable oxygen systems, continuous and intermittent flow rates, oxygen safety, concentrators, and Medicare reimbursement for oxygen.   Pulmonary Rehab from 04/02/2016 in [facility] Cardiac and Pulmonary Rehab  Date  01/22/16  Educator  LB  Instruction Review Code  2- meets goals/outcomes      Respiratory Medications: - Group verbal and written instruction to review medications for lung disease. Drug class, frequency, complications, importance of spacers, rinsing mouth after steroid MDI's, and proper cleaning methods for nebulizers.   Pulmonary Rehab from 04/02/2016 in [facility] Cardiac and Pulmonary Rehab  Date  01/22/16  Educator  LB  Instruction Review Code  2- meets goals/outcomes      AED/CPR: - Group verbal and written instruction with the use of models to demonstrate the basic use of the AED with the basic ABC's of resuscitation.   Breathing Retraining: - Provides individuals verbal and written instruction on purpose, frequency, and proper technique of diaphragmatic breathing  and pursed-lipped breathing. Applies individual practice skills.   Pulmonary Rehab from 04/02/2016 in [facility] Cardiac and Pulmonary Rehab  Date  01/22/16  Educator  LB  Instruction Review Code  2- meets goals/outcomes      Anatomy and Physiology of the Lungs: - Group verbal and written instruction with the use of models to provide basic lung anatomy and physiology related to function, structure and complications of lung disease.   Heart Failure: - Group verbal and written instruction on the basics of heart failure: signs/symptoms, treatments, explanation of ejection fraction, enlarged heart and cardiomyopathy.   Sleep Apnea: - Individual verbal and written instruction to review Obstructive Sleep Apnea. Review of risk factors, methods for diagnosing and types of masks and machines for OSA.   Anxiety: - Provides group, verbal and written instruction on the correlation between heart/lung disease and anxiety, treatment options, and management of anxiety.   Pulmonary Rehab from 04/02/2016 in [facility] Cardiac and Pulmonary Rehab  Date  02/13/16  Educator  [facility]  Instruction Review Code  2- Meets goals/outcomes      Relaxation: - Provides group, verbal and written instruction about the benefits of relaxation for patients with heart/lung disease. Also provides patients with examples of relaxation techniques.   Knowledge Questionnaire Score:     Knowledge Questionnaire Score - 01/22/16 1604      Knowledge Questionnaire Score   Pre Score 7/10       Core Components/Risk Factors/Patient Goals at Admission:     Personal Goals and Risk Factors at Admission - 01/22/16 1608      Core Components/Risk Factors/Patient Goals on Admission    Weight Management Yes  Plans to meet with the dietitian; has gained 17lbs - steroids have increased her appetite   Intervention Weight Management: Develop a combined nutrition and exercise program designed to reach desired caloric intake, while maintaining  appropriate intake of  nutrient and fiber, sodium and fats, and appropriate energy expenditure required for the weight goal.;Weight Management: Provide education and appropriate resources to help participant work on and attain dietary goals.;Weight Management/Obesity: Establish reasonable short term and long term weight goals.   Admit Weight 137 lb 12.8 oz (62.5 kg)   Goal Weight: Short Term 132 lb (59.9 kg)   Goal Weight: Long Term 120 lb (54.4 kg)   Expected Outcomes Short Term: Continue to assess and modify interventions until short term weight is achieved;Long Term: Adherence to nutrition and physical activity/exercise program aimed toward attainment of established weight goal;Weight Maintenance: Understanding of the daily nutrition guidelines, which includes 25-35% calories from fat, 7% or less cal from saturated fats, less than 242m cholesterol, less than 1.5gm of sodium, & 5 or more servings of fruits and vegetables daily;Weight Loss: Understanding of general recommendations for a balanced deficit meal plan, which promotes 1-2 lb weight loss per week and includes a negative energy balance of [phone] kcal/d;Understanding recommendations for meals to include 15-35% energy as protein, 25-35% energy from fat, 35-60% energy from carbohydrates, less than 2073mof dietary cholesterol, 20-35 gm of total fiber daily;Understanding of distribution of calorie intake throughout the day with the consumption of 4-5 meals/snacks   Sedentary Yes  Preparing for possible Lung Transplant surgery   Intervention Provide advice, education, support and counseling about physical activity/exercise needs.;Develop an individualized exercise prescription for aerobic and resistive training based on initial evaluation findings, risk stratification, comorbidities and participant's personal goals.   Expected Outcomes Achievement of increased cardiorespiratory fitness and enhanced flexibility, muscular endurance and strength shown  through measurements of functional capacity and personal statement of participant.   Increase Strength and Stamina Yes   Intervention Provide advice, education, support and counseling about physical activity/exercise needs.;Develop an individualized exercise prescription for aerobic and resistive training based on initial evaluation findings, risk stratification, comorbidities and participant's personal goals.   Expected Outcomes Achievement of increased cardiorespiratory fitness and enhanced flexibility, muscular endurance and strength shown through measurements of functional capacity and personal statement of participant.   Improve shortness of breath with ADL's Yes   Intervention Provide education, individualized exercise plan and daily activity instruction to help decrease symptoms of SOB with activities of daily living.   Expected Outcomes Short Term: Achieves a reduction of symptoms when performing activities of daily living.   Develop more efficient breathing techniques such as purse lipped breathing and diaphragmatic breathing; and practicing self-pacing with activity Yes   Intervention Provide education, demonstration and support about specific breathing techniuqes utilized for more efficient breathing. Include techniques such as pursed lipped breathing, diaphragmatic breathing and self-pacing activity.   Expected Outcomes Short Term: Participant will be able to demonstrate and use breathing techniques as needed throughout daily activities.   Increase knowledge of respiratory medications and ability to use respiratory devices properly  Yes  Duoneb SVN, ProAir with spacer, Dulero, Spiriva; 2l/m oxygen; BIPAP   Intervention Provide education and demonstration as needed of appropriate use of medications, inhalers, and oxygen therapy.   Expected Outcomes Short Term: Achieves understanding of medications use. Understands that oxygen is a medication prescribed by physician. Demonstrates appropriate  use of inhaler and oxygen therapy.      Core Components/Risk Factors/Patient Goals Review:      Goals and Risk Factor Review    Row Name 01/30/16 1350 02/11/16 1053 03/24/16 1557         Core Components/Risk Factors/Patient Goals Review   Personal Goals Review Develop more efficient  breathing techniques such as purse lipped breathing and diaphragmatic breathing and practicing self-pacing with activity. Develop more efficient breathing techniques such as purse lipped breathing and diaphragmatic breathing and practicing self-pacing with activity.;Improve shortness of breath with ADL's;Weight Management/Obesity Weight Management/Obesity;Sedentary;Increase Strength and Stamina;Improve shortness of breath with ADL's;Develop more efficient breathing techniques such as purse lipped breathing and diaphragmatic breathing and practicing self-pacing with activity.;Increase knowledge of respiratory medications and ability to use respiratory devices properly.     Review Reviewed pursed lip breathing technique with [name] [name].  She has used it in the past, but still needs reminding to use during exercise. Patient stated that she has been using PBL and has noticed improvements in SOB during ADL's. She stated she would be interesed in scheduleing an appointment with the dietician. She was given information about making this appointment.  [name] [name] has attended irregularly due to the illness of her granddaughter who has cancer. She has progressed with her exercise goals.  [name] [name] recently had another sleep study and will be getting a new CPAP machine. Wearing the mask does make a difference in her energy level. She has a good understanding of her MDI's and uses her spacer. She plans to meet with the dietitian in the future.     Expected Outcomes Short Term:  [name] [name] will become more proficient at using pursed lip breathing.  Long Term: [name] [name] will be independent in her remembering to use her pursed lip breathing.  Patient will continue to use PLB to aid in contorl of SOB. Patient will make an appointment to meet with the dieticain. Continue exercising and continue participating in [place] education as she is able with her granddaughter.        Core Components/Risk Factors/Patient Goals at Discharge (Final Review):      Goals and Risk Factor Review - 03/24/16 1557      Core Components/Risk Factors/Patient Goals Review   Personal Goals Review Weight Management/Obesity;Sedentary;Increase Strength and Stamina;Improve shortness of breath with ADL's;Develop more efficient breathing techniques such as purse lipped breathing and diaphragmatic breathing and practicing self-pacing with activity.;Increase knowledge of respiratory medications and ability to use respiratory devices properly.   Review [name] [name] has attended irregularly due to the illness of her granddaughter who has cancer. She has progressed with her exercise goals.  [name] Livengood recently had another sleep study and will be getting a new CPAP machine. Wearing the mask does make a difference in her energy level. She has a good understanding of her MDI's and uses her spacer. She plans to meet with the dietitian in the future.   Expected Outcomes Continue exercising and continue participating in [place] education as she is able with her granddaughter.      ITP Comments:     ITP Comments    Row Name 02/26/16 0732 03/12/16 1459 04/18/16 1214 05/07/16 1544     ITP Comments [name] [name] states she may be out several sessions. Her grand daughter has stage 4 brain cancer, and [name] [name] helps with her care, as well as spends time with her much as possible. [name] [name] left a message with the front desk that she has been sick and expects to be back to [facility] this week.  [name] [name] has not attended since 04/02/16. [name] [name] granddaughter is terminally ill and needs help from [name] Buske on a regular basis which affects her ability to attend [facility].        Comments: 30 Day Note Review

## 2016-05-14 DIAGNOSIS — J9621 Acute and chronic respiratory failure with hypoxia: Secondary | ICD-10-CM | POA: Diagnosis not present

## 2016-05-14 DIAGNOSIS — J449 Chronic obstructive pulmonary disease, unspecified: Secondary | ICD-10-CM

## 2016-05-14 NOTE — Progress Notes (Signed)
Daily Session Note  Patient Details  Name: [name] [name] MRN: [id number] Date of Birth: 05-13-1960 Referring Provider:    Encounter Date: 05/14/2016  Check In:     Session Check In - 05/14/16 1219      Check-In   Location [facility]-Cardiac & Pulmonary Rehab   Staff Present Carson Myrtle, BS, RRT, Respiratory Lennie Hummer, MA, ACSM RCEP, Exercise Physiologist;Edris Friedt Oletta Darter, BA, ACSM CEP, Exercise Physiologist   Supervising physician immediately available to respond to emergencies [facility] immediately available ER MD   Physician(s) Jimmye Norman and Darl Householder   Medication changes reported     No   Fall or balance concerns reported    No   Warm-up and Cool-down Performed as group-led instruction   Resistance Training Performed Yes   VAD Patient? No     Pain Assessment   Currently in Pain? No/denies         History  Smoking Status  . Former Smoker  . Packs/day: 0.50  . Years: 40.00  Smokeless Tobacco  . Never Used    Comment: quit smoking 09/26/15    Goals Met:  Proper associated with RPD/PD & O2 Sat Independence with exercise equipment Exercise tolerated well Strength training completed today  Goals Unmet:  Not Applicable  Comments: Pt able to follow exercise prescription today without complaint.  Will continue to monitor for progression.    Dr. Emily Filbert is Medical Director for [place] and [facility].

## 2016-05-19 ENCOUNTER — Ambulatory Visit (INDEPENDENT_AMBULATORY_CARE_PROVIDER_SITE_OTHER): Payer: Medicare Other | Admitting: Internal Medicine

## 2016-05-19 ENCOUNTER — Encounter: Payer: Self-pay | Admitting: Internal Medicine

## 2016-05-19 ENCOUNTER — Encounter: Payer: Medicare Other | Admitting: *Deleted

## 2016-05-19 VITALS — BP 122/78 | HR 95 | Wt 143.0 lb

## 2016-05-19 DIAGNOSIS — J441 Chronic obstructive pulmonary disease with (acute) exacerbation: Secondary | ICD-10-CM

## 2016-05-19 DIAGNOSIS — J9621 Acute and chronic respiratory failure with hypoxia: Secondary | ICD-10-CM | POA: Diagnosis not present

## 2016-05-19 DIAGNOSIS — J449 Chronic obstructive pulmonary disease, unspecified: Secondary | ICD-10-CM

## 2016-05-19 MED ORDER — TIOTROPIUM BROMIDE MONOHYDRATE 18 MCG IN CAPS: 18.0000 ug | ORAL_CAPSULE | Freq: Every day | RESPIRATORY_TRACT | 3 refills | Status: DC

## 2016-05-19 MED ORDER — ALBUTEROL SULFATE HFA 108 (90 BASE) MCG/ACT IN AERS: INHALATION_SPRAY | RESPIRATORY_TRACT | 3 refills | Status: DC

## 2016-05-19 MED ORDER — LEVOFLOXACIN 500 MG PO TABS: 500.0000 mg | ORAL_TABLET | Freq: Every day | ORAL | 0 refills | Status: DC

## 2016-05-19 MED ORDER — FLUTICASONE PROPIONATE 50 MCG/ACT NA SUSP: 1.0000 | Freq: Every day | NASAL | 5 refills | Status: DC

## 2016-05-19 MED ORDER — MOMETASONE FURO-FORMOTEROL FUM 200-5 MCG/ACT IN AERO: 2.0000 | INHALATION_SPRAY | Freq: Two times a day (BID) | RESPIRATORY_TRACT | 3 refills | Status: DC

## 2016-05-19 NOTE — Progress Notes (Signed)
[facility] Consultation     Date: 05/19/2016,   MRN# [id number] NOELLE SEASE 02-May-1960 Code Status:  Code Status History    Date Active Date Inactive Code Status Order ID Comments User Context   05/06/2015  5:11 PM 05/14/2015  5:49 PM Full Code [id number]  Wilhelmina Mcardle, MD ED   12/08/2014  9:33 PM 12/10/2014  3:11 PM Full Code [id number]  Theodoro Grist, MD Inpatient   07/15/2014  9:24 PM 07/17/2014  2:06 PM Full Code [id number]  Idelle Crouch, MD Inpatient     Hosp day:@LENGTHOFSTAYDAYS @ Referring MD: @ATDPROV @     PCP:      AdmissionWeight: 143 lb (64.9 kg)                 CurrentWeight: 143 lb (64.9 kg) Bambie L Sol is a 56 y.o. old female seen in consultation for copd and hospital follow up.     CHIEF COMPLAINT:   Follow up SOB,DOE   HISTORY OF PRESENT ILLNESS   Follow up for COPD  Patient on biPAP, on oxygen with exertion and at night-patient benefits from this therapy and is preventing recurrent admissions   Patient remains SOB, DOE seems to be at her baseline She DID not tolerate azithromycin and has restarted oral prednisonev now on 5 mg daily On dulera and spiriva and albuterol as needed +productive cough and sputum production, now thick mucus  PFT 06/2015 Ratio 48% Fev1 28% DLCO 37%  6MWT abnormal o2 sat below 87%   Her current pulmonary regimen is as follows: Dulera 200-5, 2 actuations BID Spiriva HH - once a day Prednisone 10 mg daily Albuterol MDI PRN Duoneb PRN BiPAP @ HS   Stopped smoking 5 months ago   Current Medication:   Current Outpatient Prescriptions:  .  albuterol (PROAIR HFA) 108 (90 Base) MCG/ACT inhaler, INHALE 2 PUFFS BY MOUTH EVERY 6 HOURS AS NEEDED FOR WHEEZING OR SHORTNESS OF BREATH., Disp: 3 Inhaler, Rfl: 0 .  ALPRAZolam (XANAX) 1 MG tablet, TAKE 1/2 TABLET BY MOUTH THREE TIMES DAILY AS NEEDED FOR ANXIETY, Disp: 30 tablet, Rfl: 5 .  aspirin 81 MG EC tablet, Take 1 tablet (81 mg total) by  mouth daily., Disp: 30 tablet, Rfl: 1 .  cetirizine (ZYRTEC ALLERGY) 10 MG tablet, Take 1 tablet (10 mg total) by mouth daily., Disp: 90 tablet, Rfl: 3 .  fluticasone (FLONASE) 50 MCG/ACT nasal spray, Place 1 spray into both nostrils daily., Disp: 1 g, Rfl: 5 .  guaiFENesin (MUCINEX) 600 MG 12 hr tablet, Take 1 tablet (600 mg total) by mouth 2 (two) times daily as needed for cough or to loosen phlegm., Disp: 20 tablet, Rfl: 0 .  ipratropium-albuterol (DUONEB) 0.5-2.5 (3) MG/3ML SOLN, Take 3 mLs by nebulization every 4 (four) hours as needed (for wheezing/shortness of breath)., Disp: 360 mL, Rfl: 2 .  mometasone-formoterol (DULERA) 200-5 MCG/ACT AERO, Inhale 2 puffs into the lungs 2 (two) times daily., Disp: 3 Inhaler, Rfl: 3 .  predniSONE (DELTASONE) 5 MG tablet, Take 1 tablet (5 mg total) by mouth daily with breakfast., Disp: 30 tablet, Rfl: 5 .  QUEtiapine (SEROQUEL) 50 MG tablet, Take 1 tablet (50 mg total) by mouth at bedtime., Disp: 30 tablet, Rfl: 0 .  tiotropium (SPIRIVA) 18 MCG inhalation capsule, Place 1 capsule (18 mcg total) into inhaler and inhale daily., Disp: 90 capsule, Rfl: 3     ALLERGIES   Zithromax [azithromycin] and Penicillins     REVIEW OF SYSTEMS  Review of Systems  Constitutional: Negative for chills, fever, malaise/fatigue and weight loss.  HENT: Negative for congestion.   Respiratory: Positive for shortness of breath. Negative for cough, hemoptysis, sputum production and wheezing.   Cardiovascular: Negative for chest pain, palpitations, orthopnea and leg swelling.  Gastrointestinal: Negative for heartburn, nausea and vomiting.  Psychiatric/Behavioral: Negative for depression. The patient is not nervous/anxious.   All other systems reviewed and are negative.    VS: BP 122/78 (BP Location: Left Arm, Cuff Size: Normal)   Pulse 95   Wt 143 lb (64.9 kg)   SpO2 90%   BMI 25.33 kg/m      PHYSICAL EXAM   Physical Exam  Constitutional: She is oriented to  person, place, and time. No distress.  Cardiovascular: Normal rate, regular rhythm and normal heart sounds.   No murmur heard. Pulmonary/Chest: Effort normal and breath sounds normal. No stridor. No respiratory distress. She has no wheezes. She has no rales.  Musculoskeletal: Normal range of motion. She exhibits no edema.  Neurological: She is alert and oriented to person, place, and time.  Skin: Skin is warm. She is not diaphoretic.  Psychiatric: She has a normal mood and affect.       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 She needs repeat sleep study to assess mask needs  Patient needs/requires noninvasive ventilation to survive   1.biPAP at night with oxygen therapy 2 L [place] also oxygen with exertion -90% compliance AHI 0.2, no major leaks 2.prednisone 5 mg daily prevent COPD exacerbations 3.continue dulera and spiriva 4.continue albuterol as needed needed 5.she has stopped smoking 5 months ago 6.treatment for allergic rhinitis-flonase and allegra  7.continue [facility] program 8.will prescribe Levaquin 500 mg daily for 7 days for MILD COPD Exacerbation  Follow up in 3 months  Patient are satisfied with Plan of action and management. All questions answered   [name], M.D.  [facility]  Medical Director [place] Director [facility] Cardio-Pulmonary Department

## 2016-05-19 NOTE — Progress Notes (Signed)
Daily Session Note  Patient Details  Name: [name] [name] MRN: [id number] Date of Birth: 1961/01/09 Referring Provider:    Encounter Date: 05/19/2016  Check In:     Session Check In - 05/19/16 1019      Check-In   Location [facility]-Cardiac & Pulmonary Rehab   Staff Present Nada Maclachlan, BA, ACSM CEP, Exercise Physiologist;Laureen Owens Shark, BS, RRT, Respiratory Bertis Ruddy, BS, ACSM CEP, Exercise Physiologist   Supervising physician immediately available to respond to emergencies [facility] immediately available ER MD   Physician(s) Drs. Rifenbark and Kinner   Medication changes reported     No   Fall or balance concerns reported    No   Warm-up and Cool-down Performed as group-led Location manager Performed Yes   VAD Patient? No     Pain Assessment   Currently in Pain? No/denies   Multiple Pain Sites No         History  Smoking Status  . Former Smoker  . Packs/day: 0.50  . Years: 40.00  Smokeless Tobacco  . Never Used    Comment: quit smoking 09/26/15    Goals Met:  Proper associated with RPD/PD & O2 Sat Independence with exercise equipment Exercise tolerated well Personal goals reviewed Strength training completed today  Goals Unmet:  Not Applicable  Comments: Pt able to follow exercise prescription today without complaint.  Will continue to monitor for progression.    Dr. Emily Filbert is Medical Director for [place] and [facility].

## 2016-05-19 NOTE — Patient Instructions (Signed)
Levaquin 500 mg daily for 7 days contineu inhalers as prescribed BiPAP as prescribed

## 2016-05-21 DIAGNOSIS — J449 Chronic obstructive pulmonary disease, unspecified: Secondary | ICD-10-CM

## 2016-05-21 DIAGNOSIS — J9621 Acute and chronic respiratory failure with hypoxia: Secondary | ICD-10-CM | POA: Diagnosis not present

## 2016-05-21 NOTE — Progress Notes (Signed)
Daily Session Note  Patient Details  Name: [name] [name] MRN: [id number] Date of Birth: Apr 11, 1960 Referring Provider:    Encounter Date: 05/21/2016  Check In:     Session Check In - 05/21/16 1157      Check-In   Location [facility]   Staff Present Carson Myrtle, BS, RRT, Respiratory Lennie Hummer, MA, ACSM RCEP, Exercise Physiologist;Amanda Oletta Darter, BA, ACSM CEP, Exercise Physiologist   Supervising physician immediately available to respond to emergencies [facility] immediately available ER MD   Physician(s) Devra Dopp and Quentin Cornwall   Medication changes reported     No   Fall or balance concerns reported    No   Warm-up and Cool-down Performed as group-led instruction   Resistance Training Performed Yes     Pain Assessment   Currently in Pain? No/denies   Multiple Pain Sites No         History  Smoking Status  . Former Smoker  . Packs/day: 0.50  . Years: 40.00  Smokeless Tobacco  . Never Used    Comment: quit smoking 09/26/15    Goals Met:  Proper associated with RPD/PD & O2 Sat Independence with exercise equipment Exercise tolerated well Strength training completed today  Goals Unmet:  Not Applicable  Comments: Pt able to follow exercise prescription today without complaint.  Will continue to monitor for progression.    Dr. Emily Filbert is Medical Director for [place] and [facility].

## 2016-05-23 DIAGNOSIS — J9621 Acute and chronic respiratory failure with hypoxia: Secondary | ICD-10-CM | POA: Diagnosis not present

## 2016-05-23 DIAGNOSIS — J449 Chronic obstructive pulmonary disease, unspecified: Secondary | ICD-10-CM

## 2016-05-23 NOTE — Progress Notes (Signed)
Daily Session Note  Patient Details  Name: [name] [name] MRN: [id number] Date of Birth: Sep 22, 1960 Referring Provider:    Encounter Date: 05/23/2016  Check In:     Session Check In - 05/23/16 1129      Check-In   Location [facility]   Staff Present Nyoka Cowden, RN, BSN, MA;Carroll Enterkin, RN, Vickki Hearing, BA, ACSM CEP, Exercise Physiologist   Supervising physician immediately available to respond to emergencies [facility] immediately available ER MD   Physician(s) Burlene Arnt and Quentin Cornwall   Medication changes reported     No   Fall or balance concerns reported    No   Warm-up and Cool-down Performed as group-led Location manager Performed Yes   VAD Patient? No     Pain Assessment   Currently in Pain? No/denies         History  Smoking Status  . Former Smoker  . Packs/day: 0.50  . Years: 40.00  Smokeless Tobacco  . Never Used    Comment: quit smoking 09/26/15    Goals Met:  Proper associated with RPD/PD & O2 Sat Independence with exercise equipment Exercise tolerated well Strength training completed today  Goals Unmet:  Not Applicable  Comments: Pt able to follow exercise prescription today without complaint.  Will continue to monitor for progression.    Dr. Emily Filbert is Medical Director for [place] and [facility].

## 2016-06-02 ENCOUNTER — Encounter: Payer: Self-pay | Admitting: Internal Medicine

## 2016-06-02 ENCOUNTER — Encounter: Payer: Medicare Other | Admitting: *Deleted

## 2016-06-02 ENCOUNTER — Encounter: Payer: Self-pay | Admitting: *Deleted

## 2016-06-02 ENCOUNTER — Ambulatory Visit (INDEPENDENT_AMBULATORY_CARE_PROVIDER_SITE_OTHER): Payer: Medicare Other | Admitting: Internal Medicine

## 2016-06-02 VITALS — BP 106/70 | HR 59 | Resp 16 | Ht 63.0 in | Wt 145.0 lb

## 2016-06-02 DIAGNOSIS — G4733 Obstructive sleep apnea (adult) (pediatric): Secondary | ICD-10-CM | POA: Diagnosis not present

## 2016-06-02 DIAGNOSIS — J449 Chronic obstructive pulmonary disease, unspecified: Secondary | ICD-10-CM

## 2016-06-02 DIAGNOSIS — J9611 Chronic respiratory failure with hypoxia: Secondary | ICD-10-CM | POA: Diagnosis not present

## 2016-06-02 DIAGNOSIS — J9621 Acute and chronic respiratory failure with hypoxia: Secondary | ICD-10-CM | POA: Diagnosis not present

## 2016-06-02 NOTE — Progress Notes (Signed)
Daily Session Note  Patient Details  Name: [name] [name] MRN: [id number] Date of Birth: 1960-08-12 Referring Provider:    Encounter Date: 06/02/2016  Check In:     Session Check In - 06/02/16 1114      Check-In   Location [facility]   Staff Present Nada Maclachlan, BA, ACSM CEP, Exercise Physiologist;Laureen Owens Shark, BS, RRT, Respiratory Bertis Ruddy, BS, ACSM CEP, Exercise Physiologist   Supervising physician immediately available to respond to emergencies [facility] immediately available ER MD   Physician(s) Clearnce Hasten and Corky Downs   Medication changes reported     No   Fall or balance concerns reported    No   Warm-up and Cool-down Performed as group-led instruction   Resistance Training Performed Yes   VAD Patient? No     Pain Assessment   Currently in Pain? No/denies   Multiple Pain Sites No         History  Smoking Status  . Former Smoker  . Packs/day: 0.50  . Years: 40.00  Smokeless Tobacco  . Never Used    Comment: quit smoking 09/26/15    Goals Met:  Proper associated with RPD/PD & O2 Sat Independence with exercise equipment Exercise tolerated well Strength training completed today  Goals Unmet:  Not Applicable  Comments: Pt able to follow exercise prescription today without complaint.  Will continue to monitor for progression.    Dr. Emily Filbert is Medical Director for [place] and [facility].

## 2016-06-02 NOTE — Patient Instructions (Signed)
Continue biPAP as prescribed Oxygen as needed

## 2016-06-02 NOTE — Progress Notes (Signed)
[facility] Consultation     Date: 06/02/2016,   MRN# [id number] [name] [name] [birth year] Code Status:  Code Status History    Date Active Date Inactive Code Status Order ID Comments User Context   05/06/2015  5:11 PM 05/14/2015  5:49 PM Full Code [id number]  Wilhelmina Mcardle, MD ED   12/08/2014  9:33 PM 12/10/2014  3:11 PM Full Code [id number]  Theodoro Grist, MD Inpatient   07/15/2014  9:24 PM 07/17/2014  2:06 PM Full Code [id number]  Idelle Crouch, MD Inpatient     Hosp day:@LENGTHOFSTAYDAYS @ Referring MD: @ATDPROV @     PCP:      Admission                  Current  [name] [name] is a 56 y.o. old female seen in consultation for copd and hospital follow up.     CHIEF COMPLAINT:   Follow up COPD  HISTORY OF PRESENT ILLNESS   Follow up for COPD  Patient on biPAP, on oxygen with exertion and at night-patient benefits from this therapy and is preventing recurrent admissions   Patient remains SOB, DOE seems to be at her baseline She did NOT tolerate azithromycin and has restarted oral prednisonev now on 5 mg daily On dulera and spiriva and albuterol as needed No sign of infection at this time  PFT 06/2015 Ratio 48% Fev1 28% DLCO 37%  6MWT abnormal o2 sat below 87%   Her current pulmonary regimen is as follows: Dulera 200-5, 2 actuations BID Spiriva HH - once a day Prednisone 10 mg daily Albuterol MDI PRN Duoneb PRN BiPAP @ HS   Stopped smoking 5 months ago   Current Medication:   Current Outpatient Prescriptions:  .  albuterol (PROAIR HFA) 108 (90 Base) MCG/ACT inhaler, INHALE 2 PUFFS BY MOUTH EVERY 6 HOURS AS NEEDED FOR WHEEZING OR SHORTNESS OF BREATH., Disp: 3 Inhaler, Rfl: 3 .  ALPRAZolam (XANAX) 1 MG tablet, TAKE 1/2 TABLET BY MOUTH THREE TIMES DAILY AS NEEDED FOR ANXIETY, Disp: 30 tablet, Rfl: 5 .  aspirin 81 MG EC tablet, Take 1 tablet (81 mg total) by mouth daily., Disp: 30 tablet, Rfl: 1 .  cetirizine (ZYRTEC ALLERGY) 10 MG  tablet, Take 1 tablet (10 mg total) by mouth daily., Disp: 90 tablet, Rfl: 3 .  fluticasone (FLONASE) 50 MCG/ACT nasal spray, Place 1 spray into both nostrils daily., Disp: 1 g, Rfl: 5 .  guaiFENesin (MUCINEX) 600 MG 12 hr tablet, Take 1 tablet (600 mg total) by mouth 2 (two) times daily as needed for cough or to loosen phlegm., Disp: 20 tablet, Rfl: 0 .  ipratropium-albuterol (DUONEB) 0.5-2.5 (3) MG/3ML SOLN, Take 3 mLs by nebulization every 4 (four) hours as needed (for wheezing/shortness of breath)., Disp: 360 mL, Rfl: 2 .  levofloxacin (LEVAQUIN) 500 MG tablet, Take 1 tablet (500 mg total) by mouth daily., Disp: 7 tablet, Rfl: 0 .  mometasone-formoterol (DULERA) 200-5 MCG/ACT AERO, Inhale 2 puffs into the lungs 2 (two) times daily., Disp: 3 Inhaler, Rfl: 3 .  predniSONE (DELTASONE) 5 MG tablet, Take 1 tablet (5 mg total) by mouth daily with breakfast., Disp: 30 tablet, Rfl: 5 .  QUEtiapine (SEROQUEL) 50 MG tablet, Take 1 tablet (50 mg total) by mouth at bedtime., Disp: 30 tablet, Rfl: 0 .  tiotropium (SPIRIVA) 18 MCG inhalation capsule, Place 1 capsule (18 mcg total) into inhaler and inhale daily., Disp: 90 capsule, Rfl: 3     ALLERGIES  Zithromax [azithromycin] and Penicillins     REVIEW OF SYSTEMS   Review of Systems  Constitutional: Negative for chills, fever, malaise/fatigue and weight loss.  HENT: Negative for congestion.   Respiratory: Positive for shortness of breath. Negative for cough, hemoptysis, sputum production and wheezing.   Cardiovascular: Negative for chest pain, palpitations, orthopnea and leg swelling.  Gastrointestinal: Negative for heartburn, nausea and vomiting.  Psychiatric/Behavioral: Negative for depression. The patient is not nervous/anxious.   All other systems reviewed and are negative.   BP 106/70 (BP Location: Left Arm, Patient Position: Sitting, Cuff Size: Normal)   Pulse (!) 59   Resp 16   Ht 5\' 3"  (1.6 m)   Wt 145 lb (65.8 kg)   SpO2 96%    BMI 25.69 kg/m    PHYSICAL EXAM   Physical Exam  Constitutional: She is oriented to person, place, and time. No distress.  Cardiovascular: Normal rate, regular rhythm and normal heart sounds.   No murmur heard. Pulmonary/Chest: Effort normal and breath sounds normal. No stridor. No respiratory distress. She has no wheezes. She has no rales.  Musculoskeletal: Normal range of motion. She exhibits no edema.  Neurological: She is alert and oriented to person, place, and time.  Skin: Skin is warm. She is not diaphoretic.  Psychiatric: She has a normal mood and affect.       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  Patient needs/requires noninvasive ventilation to survive   1.biPAP at night with oxygen therapy 2 L [place] also oxygen with exertion -100% compliance AHI 0.2, no major leaks 2.prednisone 5 mg daily prevent COPD exacerbations 3.continue dulera and spiriva 4.continue albuterol as needed needed 5.she has stopped smoking  6.treatment for allergic rhinitis-flonase and allegra  7.continue [facility] 8.allergy referral as requested by patient  Follow up in 6 months  Patient are satisfied with Plan of action and management. All questions answered   [name], M.D.  [facility]  Medical Director [place] Director [facility] Cardio-Pulmonary Department

## 2016-06-04 ENCOUNTER — Encounter: Payer: Medicare Other | Attending: Pulmonary Disease

## 2016-06-04 DIAGNOSIS — J449 Chronic obstructive pulmonary disease, unspecified: Secondary | ICD-10-CM

## 2016-06-04 DIAGNOSIS — J9621 Acute and chronic respiratory failure with hypoxia: Secondary | ICD-10-CM | POA: Diagnosis present

## 2016-06-04 NOTE — Progress Notes (Signed)
Daily Session Note  Patient Details  Name: [name] [name] MRN: [id number] Date of Birth: 04-03-1960 Referring Provider:    Encounter Date: 06/04/2016  Check In:     Session Check In - 06/04/16 1047      Check-In   Location [facility]   Staff Present Alberteen Sam, MA, ACSM RCEP, Exercise Physiologist;Laureen Owens Shark, BS, RRT, Respiratory Dareen Piano, BA, ACSM CEP, Exercise Physiologist   Supervising physician immediately available to respond to emergencies [facility] immediately available ER MD   Physician(s) Mariea Clonts and Reita Cliche   Medication changes reported     No   Fall or balance concerns reported    No   Warm-up and Cool-down Performed as group-led instruction   Resistance Training Performed Yes   VAD Patient? No     Pain Assessment   Currently in Pain? No/denies         History  Smoking Status  . Former Smoker  . Packs/day: 0.50  . Years: 40.00  Smokeless Tobacco  . Never Used    Comment: quit smoking 09/26/15    Goals Met:  Proper associated with RPD/PD & O2 Sat Independence with exercise equipment Exercise tolerated well Strength training completed today  Goals Unmet:  Not Applicable  Comments: Pt able to follow exercise prescription today without complaint.  Will continue to monitor for progression.    Dr. Emily Filbert is Medical Director for [place] and [facility].

## 2016-06-04 NOTE — Progress Notes (Signed)
Daily Session Note  Patient Details  Name: [name] [name] MRN: [id number] Date of Birth: Aug 12, 1960 Referring Provider:    Encounter Date: 06/04/2016  Check In:     Session Check In - 06/04/16 1047      Check-In   Location [facility]   Staff Present Alberteen Sam, MA, ACSM RCEP, Exercise Physiologist;Laureen Owens Shark, BS, RRT, Respiratory Dareen Piano, BA, ACSM CEP, Exercise Physiologist   Supervising physician immediately available to respond to emergencies [facility] immediately available ER MD   Physician(s) Mariea Clonts and Reita Cliche   Medication changes reported     No   Fall or balance concerns reported    No   Warm-up and Cool-down Performed as group-led instruction   Resistance Training Performed Yes   VAD Patient? No     Pain Assessment   Currently in Pain? No/denies         History  Smoking Status  . Former Smoker  . Packs/day: 0.50  . Years: 40.00  Smokeless Tobacco  . Never Used    Comment: quit smoking 09/26/15    Goals Met:  Proper associated with RPD/PD & O2 Sat Independence with exercise equipment Using PLB without cueing & demonstrates good technique Exercise tolerated well Strength training completed today  Goals Unmet:  Not Applicable  Comments: Pt able to follow exercise prescription today without complaint.  Will continue to monitor for progression.    Dr. Emily Filbert is Medical Director for [place] and [facility].

## 2016-06-09 ENCOUNTER — Encounter: Payer: Self-pay | Admitting: Respiratory Therapy

## 2016-06-09 DIAGNOSIS — J449 Chronic obstructive pulmonary disease, unspecified: Secondary | ICD-10-CM

## 2016-06-09 NOTE — Progress Notes (Signed)
Pulmonary Individual Treatment Plan  Patient Details  Name: [name] [name] MRN: [id number] Date of Birth: 08/04/1960 Referring Provider:    Initial Encounter Date:    Pulmonary Rehab from 01/22/2016 in [facility] Cardiac and Pulmonary Rehab  Date  01/22/16      Visit Diagnosis: COPD, severe ([place])  Patient's Home Medications on Admission:  Current Outpatient Prescriptions:    albuterol (PROAIR HFA) 108 (90 Base) MCG/ACT inhaler, INHALE 2 PUFFS BY MOUTH EVERY 6 HOURS AS NEEDED FOR WHEEZING OR SHORTNESS OF BREATH., Disp: 3 Inhaler, Rfl: 3   ALPRAZolam (XANAX) 1 MG tablet, TAKE 1/2 TABLET BY MOUTH THREE TIMES DAILY AS NEEDED FOR ANXIETY, Disp: 30 tablet, Rfl: 5   aspirin 81 MG EC tablet, Take 1 tablet (81 mg total) by mouth daily., Disp: 30 tablet, Rfl: 1   cetirizine (ZYRTEC ALLERGY) 10 MG tablet, Take 1 tablet (10 mg total) by mouth daily., Disp: 90 tablet, Rfl: 3   fluticasone (FLONASE) 50 MCG/ACT nasal spray, Place 1 spray into both nostrils daily., Disp: 1 g, Rfl: 5   guaiFENesin (MUCINEX) 600 MG 12 hr tablet, Take 1 tablet (600 mg total) by mouth 2 (two) times daily as needed for cough or to loosen phlegm., Disp: 20 tablet, Rfl: 0   ipratropium-albuterol (DUONEB) 0.5-2.5 (3) MG/3ML SOLN, Take 3 mLs by nebulization every 4 (four) hours as needed (for wheezing/shortness of breath)., Disp: 360 mL, Rfl: 2   levofloxacin (LEVAQUIN) 500 MG tablet, Take 1 tablet (500 mg total) by mouth daily., Disp: 7 tablet, Rfl: 0   mometasone-formoterol (DULERA) 200-5 MCG/ACT AERO, Inhale 2 puffs into the lungs 2 (two) times daily., Disp: 3 Inhaler, Rfl: 3   predniSONE (DELTASONE) 5 MG tablet, Take 1 tablet (5 mg total) by mouth daily with breakfast., Disp: 30 tablet, Rfl: 5   QUEtiapine (SEROQUEL) 50 MG tablet, Take 1 tablet (50 mg total) by mouth at bedtime., Disp: 30 tablet, Rfl: 0   tiotropium (SPIRIVA) 18 MCG inhalation capsule, Place 1 capsule (18 mcg total) into inhaler and inhale daily.,  Disp: 90 capsule, Rfl: 3  Past Medical History: Past Medical History:  Diagnosis Date   Breast cancer ([place])    Cancer ([place])    COPD (chronic obstructive pulmonary disease) ([place])     Tobacco Use: History  Smoking Status   Former Smoker   Packs/day: 0.50   Years: 40.00  Smokeless Tobacco   Never Used    Comment: quit smoking 09/26/15    Labs: Recent Review Flowsheet Data    Labs for ITP Cardiac and Pulmonary Rehab Latest Ref Rng & Units 05/06/2015 05/08/2015 10/31/2015 10/31/2015 10/31/2015   Cholestrol 0 - 200 mg/dL - - - - -   LDLCALC 0 - 100 mg/dL - - - - -   HDL 40 - 60 mg/dL - - - - -   Trlycerides 0 - 200 mg/dL - - - - -   Hemoglobin A1c 4.8 - 5.6 % - - 5.0 - -   PHART 7.350 - 7.450 7.31(L) 7.36 - - -   PCO2ART 32.0 - 48.0 mmHg 57(H) 66(H) - - -   HCO3 20.0 - 28.0 mmol/L 28.7(H) 37.3(H) - 36.1(H) 35.9(H)   O2SAT % 96.3 96.3 - 98.2 97.4       ADL UCSD:     Pulmonary Assessment Scores    Row Name 01/22/16 1606         ADL UCSD   ADL Phase Entry     SOB Score total 81  Rest 0     Walk 3     Stairs 5     Bath 4     Dress 3     Shop 4        Pulmonary Function Assessment:     Pulmonary Function Assessment - 01/22/16 1604      Pulmonary Function Tests   RV% 94 %   DLCO% 37 %     Initial Spirometry Results   FVC% 47 %   FEV1% 28 %   FEV1/FVC Ratio 48   Comments Test date      Post Bronchodilator Spirometry Results   FVC% 44 %   FEV1% 27 %   FEV1/FVC Ratio 49     Breath   Shortness of Breath Yes;Limiting activity;Fear of Shortness of Breath      Exercise Target Goals:    Exercise Program Goal: Individual exercise prescription set with THRR, safety & activity barriers. Participant demonstrates ability to understand and report RPE using BORG scale, to self-measure pulse accurately, and to acknowledge the importance of the exercise prescription.  Exercise Prescription Goal: Starting with aerobic activity 30 plus minutes a day, 3 days  per week for initial exercise prescription. Provide home exercise prescription and guidelines that participant acknowledges understanding prior to discharge.  Activity Barriers & Risk Stratification:     Activity Barriers & Cardiac Risk Stratification - 01/22/16 1604      Activity Barriers & Cardiac Risk Stratification   Activity Barriers Shortness of Breath;Deconditioning;Muscular Weakness   Cardiac Risk Stratification Moderate      6 Minute Walk:     6 Minute Walk    Row Name 01/22/16 1546         6 Minute Walk   Distance 990 feet     Walk Time 6 minutes     # of Rest Breaks 0     MPH 1.88     METS 3.19     RPE 11     Perceived Dyspnea  3     VO2 Peak 11.18     Symptoms No     Resting HR 110 bpm     Resting BP 108/78     Max Ex. HR 127 bpm     Max Ex. BP 108/60       Interval HR   Baseline HR 110     1 Minute HR 113     2 Minute HR 123     3 Minute HR 127     4 Minute HR 126     5 Minute HR 125     6 Minute HR 124     Interval Heart Rate? Yes       Interval Oxygen   Interval Oxygen? Yes     Baseline Oxygen Saturation % 92 %     Baseline Liters of Oxygen 2 L     1 Minute Oxygen Saturation % 91 %     1 Minute Liters of Oxygen 2 L     2 Minute Oxygen Saturation % 94 %     2 Minute Liters of Oxygen 2 L     3 Minute Oxygen Saturation % 95 %     3 Minute Liters of Oxygen 2 L     4 Minute Oxygen Saturation % 95 %     4 Minute Liters of Oxygen 2 L     5 Minute Oxygen Saturation % 94 %     5 Minute Liters of Oxygen  2 L     6 Minute Oxygen Saturation % 93 %     6 Minute Liters of Oxygen 2 L     2 Minute Post Liters of Oxygen 2 L       Oxygen Initial Assessment:     Oxygen Initial Assessment - 04/14/16 0815      Home Oxygen   Home Oxygen Device Portable Concentrator;Home Concentrator   Sleep Oxygen Prescription Continuous   Liters per minute --  2-5   Home Exercise Oxygen Prescription Continuous   Liters per minute --  2-5   Home at Rest Exercise  Oxygen Prescription Continuous   Liters per minute --  2-5   Compliance with Home Oxygen Use Yes     Initial 6 min Walk   Oxygen Used Continuous   Liters per minute 2   Resting Oxygen Saturation  during 6 min walk 92 %   Exercise Oxygen Saturation  during 6 min walk 89 %     Program Oxygen Prescription   Program Oxygen Prescription Continuous   Liters per minute --  2-4     Intervention   Short Term Goals To learn and exhibit compliance with exercise, home and travel O2 prescription;To Learn and understand importance of maintaining oxygen saturations>88%;To learn and demonstrate proper use of respiratory medications;To learn and understand importance of monitoring SPO2 with pulse oximeter and demonstrate accurate use of the pulse oximeter.;To learn and demonstrate proper purse lipped breathing techniques or other breathing techniques.   Long  Term Goals Exhibits compliance with exercise, home and travel O2 prescription;Maintenance of O2 saturations>88%;Compliance with respiratory medication;Demonstrates proper use of MDIs;Exhibits proper breathing techniques, such as purse lipped breathing or other method taught during program session;Verbalizes importance of monitoring SPO2 with pulse oximeter and return demonstration      Oxygen Re-Evaluation:     Oxygen Re-Evaluation    Row Name 04/02/16 1516 05/20/16 0720           Program Oxygen Prescription   Program Oxygen Prescription  -- Continuous;Portable Concentrator      Liters per minute  -- 2        Home Oxygen   Home Oxygen Device  -- Portable Concentrator;Home Concentrator      Sleep Oxygen Prescription  -- Continuous      Liters per minute  -- 2      Home Exercise Oxygen Prescription  -- Continuous      Liters per minute  -- 2      Home at Rest Exercise Oxygen Prescription  -- Continuous      Liters per minute  -- 2      Compliance with Home Oxygen Use  -- Yes        Goals/Expected Outcomes   Comments Ms Asper  has been walking into [facility] with out her oxygen. We checked her O2Sat and it was 90%. I suggested she bring her oximeter and check her O2Sat's as she comes into [facility].I educated her on the importance of O2Sats in the 9()%'s for the health of her lungs and heart. Ms Lemus has a good understanding of her oxygen and uses 2-3l/m with exercise in [place].      Goals/Expected Outcomes  -- Continue using her oxygen with activity and self-monitor her oxygen saturations with her finger oximeter.         Oxygen Discharge (Final Oxygen Re-Evaluation):     Oxygen Re-Evaluation - 05/20/16 0720      Program Oxygen Prescription  Program Oxygen Prescription Continuous;Portable Concentrator   Liters per minute 2     Home Oxygen   Home Oxygen Device Portable Concentrator;Home Concentrator   Sleep Oxygen Prescription Continuous   Liters per minute 2   Home Exercise Oxygen Prescription Continuous   Liters per minute 2   Home at Rest Exercise Oxygen Prescription Continuous   Liters per minute 2   Compliance with Home Oxygen Use Yes     Goals/Expected Outcomes   Comments Ms Picchi has a good understanding of her oxygen and uses 2-3l/m with exercise in [place].   Goals/Expected Outcomes Continue using her oxygen with activity and self-monitor her oxygen saturations with her finger oximeter.      Initial Exercise Prescription:     Initial Exercise Prescription - 01/22/16 1500      Date of Initial Exercise RX and Referring Provider   Date 01/22/16     Oxygen   Oxygen Continuous   Liters 2     Treadmill   MPH 1.8   Grade 0.5   Minutes 15     NuStep   Level 3   Minutes 15   METs 3     REL-XR   Level 2   Minutes 15   METs 3     Prescription Details   Frequency (times per week) 3   Duration Progress to 45 minutes of aerobic exercise without signs/symptoms of physical distress     Intensity   THRR 40-80% of Max Heartrate 131-152   Ratings of Perceived Exertion 11-13    Perceived Dyspnea 0-4     Progression   Progression Continue to progress workloads to maintain intensity without signs/symptoms of physical distress.     Resistance Training   Training Prescription Yes   Weight 3      Perform Capillary Blood Glucose checks as needed.  Exercise Prescription Changes:     Exercise Prescription Changes    Row Name 01/30/16 1300 02/07/16 1200 02/22/16 0800 03/07/16 0900 03/31/16 1100     Response to Exercise   Blood Pressure (Admit) 110/68 108/60 116/78 124/60  --   Blood Pressure (Exercise) 126/70 128/62 126/60 108/68  --   Blood Pressure (Exit) 100/60 118/70 122/70 120/70  --   Heart Rate (Admit) 87 bpm 102 bpm 122 bpm 97 bpm  --   Heart Rate (Exercise) 118 bpm 133 bpm 138 bpm 134 bpm  --   Heart Rate (Exit) 100 bpm 108 bpm 106 bpm 123 bpm  --   Oxygen Saturation (Admit) 91 % 95 % 97 % 90 %  --   Oxygen Saturation (Exercise) 93 % 96 % 97 % 99 %  --   Oxygen Saturation (Exit) 98 % 97 % 98 % 98 %  --   Rating of Perceived Exertion (Exercise) _0 decreased speed on TM  --   Perceived Dyspnea (Exercise) _1 --   Symptoms panic attack on treadmill  --  --  --  --   Duration Progress to 45 minutes of aerobic exercise without signs/symptoms of physical distress Progress to 45 minutes of aerobic exercise without signs/symptoms of physical distress Progress to 45 minutes of aerobic exercise without signs/symptoms of physical distress Progress to 45 minutes of aerobic exercise without signs/symptoms of physical distress Progress to 45 minutes of aerobic exercise without signs/symptoms of physical distress   Intensity _2      Progression  Progression Continue to progress workloads to maintain intensity without signs/symptoms of physical distress. Continue to progress workloads to maintain intensity without signs/symptoms of physical distress. Continue to progress workloads  to maintain intensity without signs/symptoms of physical distress. Continue to progress workloads to maintain intensity without signs/symptoms of physical distress. Continue to progress workloads to maintain intensity without signs/symptoms of physical distress.   Average METs 2.23  --  --  --  --     Horticulturist, commercial Prescription  -- Yes Yes Yes Yes   Weight  -- _0 Reps  -- 10-15 10-15 10-15 10-15     Interval Training   Interval Training  -- No No No No     Oxygen   Oxygen  -- Continuous Continuous Continuous Continuous   Liters  -- _1 Treadmill   MPH  -- 1.8  -- 1.5 1.5   Grade  -- 0.5  -- 0.5 0.5   Minutes  -- 15  -- 15 15     NuStep   Level  -- 3  -- 3 3   Minutes  -- 15  -- 15 15   METs  -- 1.7  -- 2.1 2.1     REL-XR   Level  -- 3  --  --  --   Minutes  -- 15  --  --  --   METs  -- 1.8  --  --  --     Home Exercise Plan   Plans to continue exercise at  --  --  --  -- Home (comment)  considering Financial [employer] upon graduation of program   Frequency  --  --  --  -- Add 1 additional day to program exercise sessions.   Initial Home Exercises Provided  --  --  --  -- 03/31/16     Exercise Review   Progression --  First Full Day of Exercise  --  --  --  --   Row Name 04/03/16 1100 05/01/16 1000 05/14/16 1200 05/28/16 1400       Response to Exercise   Blood Pressure (Admit) 110/68 108/60 122/74 122/84    Blood Pressure (Exercise) 122/74 138/42 130/64 140/70    Blood Pressure (Exit) 104/62 104/64 134/74 112/66    Heart Rate (Admit) 123 bpm 107 bpm 94 bpm 89 bpm    Heart Rate (Exercise) 128 bpm 120 bpm 111 bpm 116 bpm    Heart Rate (Exit) 117 bpm 102 bpm 94 bpm 94 bpm    Oxygen Saturation (Admit) 91 % 98 % 97 % 93 %    Oxygen Saturation (Exercise) 97 % 90 % 99 % 96 %    Oxygen Saturation (Exit) 96 % 97 % 98 % 98 %    Rating of Perceived Exertion (Exercise) _2 Perceived Dyspnea (Exercise) _3 Duration Progress to 45  minutes of aerobic exercise without signs/symptoms of physical distress Progress to 45 minutes of aerobic exercise without signs/symptoms of physical distress Progress to 45 minutes of aerobic exercise without signs/symptoms of physical distress Continue with 45 min of aerobic exercise without signs/symptoms of physical distress.    Intensity THRR unchanged THRR unchanged THRR unchanged  --      Progression   Progression Continue to progress workloads to maintain intensity without signs/symptoms of physical distress. Continue to progress workloads to maintain intensity without signs/symptoms  of physical distress. Continue to progress workloads to maintain intensity without signs/symptoms of physical distress. Continue to progress workloads to maintain intensity without signs/symptoms of physical distress.      Resistance Training   Training Prescription Yes Yes Yes Yes    Weight _0 Reps 10-15 10-15 10-15 10-15      Interval Training   Interval Training No No No No      Oxygen   Oxygen Continuous Continuous Continuous Continuous    Liters _1 Treadmill   MPH  --  --  -- 1.3    Grade  --  --  -- 0.5    Minutes  --  --  -- 15      NuStep   Level _2 --    Minutes _3 --    METs 2.5  -- 2.1  --      REL-XR   Level _4 Minutes _5 METs 2.6 3 5.7  --       Exercise Comments:     Exercise Comments    Row Name 01/30/16 1352 02/22/16 0906 03/07/16 0923 04/03/16 1133 04/18/16 1213   Exercise Comments First full day of exercise!  Patient was oriented to gym and equipment including functions, settings, policies, and procedures.  Patient's individual exercise prescription and treatment plan were reviewed.  All starting workloads were established based on the results of the 6 minute walk test done at initial orientation visit.  The plan for exercise progression was also introduced and progression will be customized based on patient's performance  and goals.First full day of exercise!  Patient was oriented to gym and equipment including functions, settings, policies, and procedures.  Patient's individual exercise prescription and treatment plan were reviewed.  All starting workloads were established based on the results of the 6 minute walk test done at initial orientation visit.  The plan for exercise progression was also introduced and progression will be customized based on patient's performance and goals.  Markie did have a small panic attack on the treadmill today.  We talked though it and she recovered and was able to continue to exercise.  We will work on her treadmill tolerance and fears. [name] is progressing well with exercise and has not had any more panic attacks. [name] has missed sessions due to her granddaughter being very ill. [name] has done well with exercise since returning after a break of 3 + weeks due to family health issues. [name] has not attended since 04/02/16.   [place]ianza Name 05/01/16 1105 05/14/16 1230 05/28/16 1412       Exercise Comments Markie tolerates exercise well when she attends.  She has some family conflicts that interfere with regular attendance. [name] has not been able to attend consistently due to other family issues. [name] has tolerated exrecise well and has attended regularly during April.        Exercise Goals and Review:     Exercise Goals    Row Name 03/31/16 1115             Exercise Goals   Increase Physical Activity --  Home exercise guidelines reviewed       Intervention --       Expected Outcomes --       Increase Strength and Stamina --  adding 1 day/wk of strength training  at home       Intervention --       Expected Outcomes --          Exercise Goals Re-Evaluation :     Exercise Goals Re-Evaluation    Row Name 03/31/16 1120             Exercise Goal Re-Evaluation   Exercise Goals Review Increase Physical Activity;Increase Strenth and Stamina       Comments Reviewed  home exercise guildelines        Expected Outcomes Patient will add 1 day of strength training at home and is considering joining [employer] upon graduation.           Discharge Exercise Prescription (Final Exercise Prescription Changes):     Exercise Prescription Changes - 05/28/16 1400      Response to Exercise   Blood Pressure (Admit) 122/84   Blood Pressure (Exercise) 140/70   Blood Pressure (Exit) 112/66   Heart Rate (Admit) 89 bpm   Heart Rate (Exercise) 116 bpm   Heart Rate (Exit) 94 bpm   Oxygen Saturation (Admit) 93 %   Oxygen Saturation (Exercise) 96 %   Oxygen Saturation (Exit) 98 %   Rating of Perceived Exertion (Exercise) 13   Perceived Dyspnea (Exercise) 3   Duration Continue with 45 min of aerobic exercise without signs/symptoms of physical distress.     Progression   Progression Continue to progress workloads to maintain intensity without signs/symptoms of physical distress.     Resistance Training   Training Prescription Yes   Weight 3   Reps 10-15     Interval Training   Interval Training No     Oxygen   Oxygen Continuous   Liters 3     Treadmill   MPH 1.3   Grade 0.5   Minutes 15     REL-XR   Level 1   Minutes 15      Nutrition:  Target Goals: Understanding of nutrition guidelines, daily intake of sodium <1561m, cholesterol <2065m calories 30% from fat and 7% or less from saturated fats, daily to have 5 or more servings of fruits and vegetables.  Biometrics:     Pre Biometrics - 01/22/16 1546      Pre Biometrics   Height 5' 3" (1.6 m)   Weight 137 lb 12.8 oz (62.5 kg)   Waist Circumference 31 inches   Hip Circumference 38.5 inches   Waist to Hip Ratio 0.81 %   BMI (Calculated) 24.5       Nutrition Therapy Plan and Nutrition Goals:   Nutrition Discharge: Rate Your Plate Scores:   Nutrition Goals Re-Evaluation:   Nutrition Goals Discharge (Final Nutrition Goals Re-Evaluation):   Psychosocial: Target Goals:  Acknowledge presence or absence of significant depression and/or stress, maximize coping skills, provide positive support system. Participant is able to verbalize types and ability to use techniques and skills needed for reducing stress and depression.   Initial Review & Psychosocial Screening:     Initial Psych Review & Screening - 01/22/16 [place]   Comments Ms BrIrvingas good family support from her children. She has depression, but not from her COPD and did not offer an explanation. She does go to a support group on Wednesday's. She is very excited about [facility] and is ready "to make a change".     Barriers   Psychosocial barriers to participate in program The patient should  benefit from training in stress management and relaxation.     Screening Interventions   Interventions Encouraged to exercise;Program counselor consult      Quality of Life Scores:     Quality of Life - 01/22/16 1614      Quality of Life Scores   Health/Function Pre 21 %   Socioeconomic Pre 21 %   Psych/Spiritual Pre 21 %   Family Pre 21 %   GLOBAL Pre 21 %      PHQ-9: Recent Review Flowsheet Data    Depression screen [facility] 2/9 01/22/2016   Decreased Interest 2   Down, Depressed, Hopeless 2   PHQ - 2 Score 4   Altered sleeping 3   Tired, decreased energy 3   Change in appetite 0   Feeling bad or failure about yourself  3   Trouble concentrating 1   Moving slowly or fidgety/restless 1   Suicidal thoughts 0   PHQ-9 Score 15   Difficult doing work/chores Very difficult     Interpretation of Total Score  Total Score Depression Severity:  1-4 = Minimal depression, 5-9 = Mild depression, 10-14 = Moderate depression, 15-19 = Moderately severe depression, 20-27 = Severe depression   Psychosocial Evaluation and Intervention:     Psychosocial Evaluation - 03/12/16 1501      Psychosocial Evaluation & Interventions   Comments Counselor met with Ms.  B today for initial psychosocial evaluation.  She is a 56 year old who has been struggling with COPD for 7-8 years and is hoping to get on the lung transplant list.  She lives alone but has (3) adult children and other family members who live close by.  She has been able to improve her sleep recently with a diagnosis of Sleep Apnea and use of a Bi-Pap -with 8-10 hours/night of sleep.  Her appetite is good as well.  Ms. B reports a history of depression and has been on medication for this for awhile.  She has had recent stressors that have impacted her mood with her PHQ-9 score of 15 indicating moderate depression.  Counselor processed this with Ms. B reporting her 41 year old granddaughter was recently diagnosed with Stage 4 brain cancer.  Also, Ms. Sharmaine Base best friend recently died.  She copes with exercise; leaning on other family members and attends a support group as well as sees a Social worker weekly.  Ms. B has goals to breathe better and be able to get on the lung transplant list.  Staff will follow with Ms. B throughout the course of this program.        Psychosocial Re-Evaluation:     Psychosocial Re-Evaluation    Row Name 02/15/16 1120 03/12/16 1501 03/24/16 1031 05/14/16 1040 05/21/16 1121     Psychosocial Re-Evaluation   Comments Caterin called and said she is sorry that she can not exercise today in Pulmonary Rehab since she has to be out with a family emergency.  Ms Gagan on her last visit to [name] stated that her granddaughter was battling brain cancer. This has been very difficult, and Ms Sylva will be with her as much as she can to support her through this illness. Ms Denherder is relying on her [place] for strength and is praying for a miracle Counselor follow with Ms. Marlon Pel [facility]) today stating it is her first day back in almost a month due to her need to be with family to support during her granddaughter's battle with brain cancer.  [name] states she  has not smoked one  cigarette through all of this (almost 3 months now) and she is relying on her family and faith to get her through this time.  [name] sister from [place] is visiting currently and this helps somewhat.  Unfortunately, [name] states she is over eating and over-sleeping during this transition as well.  Counselor commended [employer] for using more positive coping strategies and not smoking currently.  Commended her on attending Pulmonary Rehab and taking time for herself.  Also encouraged her to have healthy food options in the home vs. "comfort food" during this time.  Counselor offered supportive services and will continue to follow with her throughout the course of this program.  Counselor follow up with [name] today stating she has been coming sporatically due to her family situation with the granddaughter's health.  She reports her daughter (the mother of her granddaughter) is not doing well "at all" and she is very concerned about her.  Her other daughter has just taken a leave of absence from work to help [place] and family.  Counselor commended [employer] for even being here today with all that is going on in her life.  She reports her family and faith are her constant supports to help her through this.  Counselor encouraged her to continue to take time for herself so she can be more able to help others.  Counselor and staff will continue to follow with [facility] through this difficult time.   Counselor follow up with [facility] today reporting things are "somewhat better" with her daughter currently as the "[employer]" foundation are flying the family - including her granddaughter - to [place] to visit family this upcoming weekend.  They will be accompanied by a nurse as well to help this process.  [name] states she will not be going; but is looking very forward to the break and a weekend of relaxation just for herself!  Counselor commended her on setting healthy boundaries and limits with her family in order to improve her  self-care.  Staff will continue to follow with [facility].    Continue Psychosocial Services   --  --  --  -- Follow up required by staff   Row Name 06/04/16 1118             Psychosocial Re-Evaluation   Current issues with Current Stress Concerns       Comments [name] came in today looking a little down.  Her granddaughter is back in the hosiptal as the [employer] trip was too much for her.  She said that her granddaughter continues to fight and is filled with the [place].  Her and [name] have a very special bond.  [name] is trying to be strong for her daughter and granddaughter.  She continues to spend time in prayer and looking upward for guideance.  Her granddaughter is ready and in a good place, her main concern is her daughter and her mental health.  She was headed back over for another visit.  She is going to continue to pray constantly.       Expected Outcomes [name] will rely on her faith to maintain her positive outlook.  She will be able to be there for her daughter and still take care of herself.       Interventions Encouraged to attend Pulmonary Rehabilitation for the exercise;Stress management education       Continue Psychosocial Services  Follow up required by staff  Psychosocial Discharge (Final Psychosocial Re-Evaluation):     Psychosocial Re-Evaluation - 06/04/16 1118      Psychosocial Re-Evaluation   Current issues with Current Stress Concerns   Comments [name] came in today looking a little down.  Her granddaughter is back in the hosiptal as the [employer] trip was too much for her.  She said that her granddaughter continues to fight and is filled with the [place].  Her and [name] have a very special bond.  [name] is trying to be strong for her daughter and granddaughter.  She continues to spend time in prayer and looking upward for guideance.  Her granddaughter is ready and in a good place, her main concern is her daughter and her mental health.  She was headed back over  for another visit.  She is going to continue to pray constantly.   Expected Outcomes [name] will rely on her faith to maintain her positive outlook.  She will be able to be there for her daughter and still take care of herself.   Interventions Encouraged to attend Pulmonary Rehabilitation for the exercise;Stress management education   Continue Psychosocial Services  Follow up required by staff      Education: Education Goals: Education classes will be provided on a weekly basis, covering required topics. Participant will state understanding/return demonstration of topics presented.  Learning Barriers/Preferences:     Learning Barriers/Preferences - 01/22/16 1604      Learning Barriers/Preferences   Learning Barriers None   Learning Preferences None      Education Topics: Initial Evaluation Education: - Verbal, written and demonstration of respiratory meds, RPE/PD scales, oximetry and breathing techniques. Instruction on use of nebulizers and MDIs: cleaning and proper use, rinsing mouth with steroid doses and importance of monitoring MDI activations.   Pulmonary Rehab from 06/04/2016 in [facility] Cardiac and Pulmonary Rehab  Date  01/22/16  Educator  LB  Instruction Review Code  2- meets goals/outcomes      General Nutrition Guidelines/Fats and Fiber: -Group instruction provided by verbal, written material, models and posters to present the general guidelines for heart healthy nutrition. Gives an explanation and review of dietary fats and fiber.   Pulmonary Rehab from 06/04/2016 in [facility] Cardiac and Pulmonary Rehab  Date  06/02/16  Educator  CR  Instruction Review Code  2- meets goals/outcomes      Controlling Sodium/Reading Food Labels: -Group verbal and written material supporting the discussion of sodium use in heart healthy nutrition. Review and explanation with models, verbal and written materials for utilization of the food label.   Exercise Physiology & Risk Factors: - Group  verbal and written instruction with models to review the exercise physiology of the cardiovascular system and associated critical values. Details cardiovascular disease risk factors and the goals associated with each risk factor.   Aerobic Exercise & Resistance Training: - Gives group verbal and written discussion on the health impact of inactivity. On the components of aerobic and resistive training programs and the benefits of this training and how to safely progress through these programs.   Flexibility, Balance, General Exercise Guidelines: - Provides group verbal and written instruction on the benefits of flexibility and balance training programs. Provides general exercise guidelines with specific guidelines to those with heart or lung disease. Demonstration and skill practice provided.   Stress Management: - Provides group verbal and written instruction about the health risks of elevated stress, cause of high stress, and healthy ways to reduce stress.   Depression: - Provides  group verbal and written instruction on the correlation between heart/lung disease and depressed mood, treatment options, and the stigmas associated with seeking treatment.   Exercise & Equipment Safety: - Individual verbal instruction and demonstration of equipment use and safety with use of the equipment.   Pulmonary Rehab from 06/04/2016 in [facility] Cardiac and Pulmonary Rehab  Date  02/01/16  Educator  C. EnterkinRN  Instruction Review Code  2- meets goals/outcomes      Infection Prevention: - Provides verbal and written material to individual with discussion of infection control including proper hand washing and proper equipment cleaning during exercise session.   Falls Prevention: - Provides verbal and written material to individual with discussion of falls prevention and safety.   Diabetes: - Individual verbal and written instruction to review signs/symptoms of diabetes, desired ranges of glucose  level fasting, after meals and with exercise. Advice that pre and post exercise glucose checks will be done for 3 sessions at entry of program.   Chronic Lung Diseases: - Group verbal and written instruction to review new updates, new respiratory medications, new advancements in procedures and treatments. Provide informative websites and "800" numbers of self-education.   Lung Procedures: - Group verbal and written instruction to describe testing methods done to diagnose lung disease. Review the outcome of test results. Describe the treatment choices: Pulmonary Function Tests, ABGs and oximetry.   Energy Conservation: - Provide group verbal and written instruction for methods to conserve energy, plan and organize activities. Instruct on pacing techniques, use of adaptive equipment and posture/positioning to relieve shortness of breath.   Pulmonary Rehab from 06/04/2016 in [facility] Cardiac and Pulmonary Rehab  Date  04/02/16  Educator  [facility]  Instruction Review Code  2- meets goals/outcomes      Triggers: - Group verbal and written instruction to review types of environmental controls: home humidity, furnaces, filters, dust mite/pet prevention, HEPA vacuums. To discuss weather changes, air quality and the benefits of nasal washing.   Pulmonary Rehab from 06/04/2016 in [facility] Cardiac and Pulmonary Rehab  Date  01/30/16  Educator  LB  Instruction Review Code  2- meets goals/outcomes      Exacerbations: - Group verbal and written instruction to provide: warning signs, infection symptoms, calling MD promptly, preventive modes, and value of vaccinations. Review: effective airway clearance, coughing and/or vibration techniques. Create an Sports administrator.   Pulmonary Rehab from 06/04/2016 in [facility] Cardiac and Pulmonary Rehab  Date  05/21/16  Educator  LB  Instruction Review Code  2- meets goals/outcomes      Oxygen: - Individual and group verbal and written instruction on oxygen therapy. Includes  supplement oxygen, available portable oxygen systems, continuous and intermittent flow rates, oxygen safety, concentrators, and Medicare reimbursement for oxygen.   Pulmonary Rehab from 06/04/2016 in [facility] Cardiac and Pulmonary Rehab  Date  01/22/16  Educator  LB  Instruction Review Code  2- meets goals/outcomes      Respiratory Medications: - Group verbal and written instruction to review medications for lung disease. Drug class, frequency, complications, importance of spacers, rinsing mouth after steroid MDI's, and proper cleaning methods for nebulizers.   Pulmonary Rehab from 06/04/2016 in [facility] Cardiac and Pulmonary Rehab  Date  01/22/16  Educator  LB  Instruction Review Code  2- meets goals/outcomes      AED/CPR: - Group verbal and written instruction with the use of models to demonstrate the basic use of the AED with the basic ABC's of resuscitation.   Breathing Retraining: - Provides  individuals verbal and written instruction on purpose, frequency, and proper technique of diaphragmatic breathing and pursed-lipped breathing. Applies individual practice skills.   Pulmonary Rehab from 06/04/2016 in [facility] Cardiac and Pulmonary Rehab  Date  01/22/16  Educator  LB  Instruction Review Code  2- meets goals/outcomes      Anatomy and Physiology of the Lungs: - Group verbal and written instruction with the use of models to provide basic lung anatomy and physiology related to function, structure and complications of lung disease.   Heart Failure: - Group verbal and written instruction on the basics of heart failure: signs/symptoms, treatments, explanation of ejection fraction, enlarged heart and cardiomyopathy.   Sleep Apnea: - Individual verbal and written instruction to review Obstructive Sleep Apnea. Review of risk factors, methods for diagnosing and types of masks and machines for OSA.   Anxiety: - Provides group, verbal and written instruction on the correlation between heart/lung  disease and anxiety, treatment options, and management of anxiety.   Pulmonary Rehab from 06/04/2016 in [facility] Cardiac and Pulmonary Rehab  Date  02/13/16  Educator  [facility]  Instruction Review Code  2- Meets goals/outcomes      Relaxation: - Provides group, verbal and written instruction about the benefits of relaxation for patients with heart/lung disease. Also provides patients with examples of relaxation techniques.   Pulmonary Rehab from 06/04/2016 in [facility] Cardiac and Pulmonary Rehab  Date  06/04/16  Educator  [facility]  Instruction Review Code  2- Meets goals/outcomes      Knowledge Questionnaire Score:     Knowledge Questionnaire Score - 01/22/16 1604      Knowledge Questionnaire Score   Pre Score 7/10       Core Components/Risk Factors/Patient Goals at Admission:     Personal Goals and Risk Factors at Admission - 01/22/16 1608      Core Components/Risk Factors/Patient Goals on Admission    Weight Management Yes  Plans to meet with the dietitian; has gained 17lbs - steroids have increased her appetite   Intervention Weight Management: Develop a combined nutrition and exercise program designed to reach desired caloric intake, while maintaining appropriate intake of nutrient and fiber, sodium and fats, and appropriate energy expenditure required for the weight goal.;Weight Management: Provide education and appropriate resources to help participant work on and attain dietary goals.;Weight Management/Obesity: Establish reasonable short term and long term weight goals.   Admit Weight 137 lb 12.8 oz (62.5 kg)   Goal Weight: Short Term 132 lb (59.9 kg)   Goal Weight: Long Term 120 lb (54.4 kg)   Expected Outcomes Short Term: Continue to assess and modify interventions until short term weight is achieved;Long Term: Adherence to nutrition and physical activity/exercise program aimed toward attainment of established weight goal;Weight Maintenance: Understanding of the daily nutrition guidelines,  which includes 25-35% calories from fat, 7% or less cal from saturated fats, less than 266m cholesterol, less than 1.5gm of sodium, & 5 or more servings of fruits and vegetables daily;Weight Loss: Understanding of general recommendations for a balanced deficit meal plan, which promotes 1-2 lb weight loss per week and includes a negative energy balance of [phone] kcal/d;Understanding recommendations for meals to include 15-35% energy as protein, 25-35% energy from fat, 35-60% energy from carbohydrates, less than 2023mof dietary cholesterol, 20-35 gm of total fiber daily;Understanding of distribution of calorie intake throughout the day with the consumption of 4-5 meals/snacks   Sedentary Yes  Preparing for possible Lung Transplant surgery   Intervention Provide advice, education, support  and counseling about physical activity/exercise needs.;Develop an individualized exercise prescription for aerobic and resistive training based on initial evaluation findings, risk stratification, comorbidities and participant's personal goals.   Expected Outcomes Achievement of increased cardiorespiratory fitness and enhanced flexibility, muscular endurance and strength shown through measurements of functional capacity and personal statement of participant.   Increase Strength and Stamina Yes   Intervention Provide advice, education, support and counseling about physical activity/exercise needs.;Develop an individualized exercise prescription for aerobic and resistive training based on initial evaluation findings, risk stratification, comorbidities and participant's personal goals.   Expected Outcomes Achievement of increased cardiorespiratory fitness and enhanced flexibility, muscular endurance and strength shown through measurements of functional capacity and personal statement of participant.   Improve shortness of breath with ADL's Yes   Intervention Provide education, individualized exercise plan and daily activity  instruction to help decrease symptoms of SOB with activities of daily living.   Expected Outcomes Short Term: Achieves a reduction of symptoms when performing activities of daily living.   Develop more efficient breathing techniques such as purse lipped breathing and diaphragmatic breathing; and practicing self-pacing with activity Yes   Intervention Provide education, demonstration and support about specific breathing techniuqes utilized for more efficient breathing. Include techniques such as pursed lipped breathing, diaphragmatic breathing and self-pacing activity.   Expected Outcomes Short Term: Participant will be able to demonstrate and use breathing techniques as needed throughout daily activities.   Increase knowledge of respiratory medications and ability to use respiratory devices properly  Yes  Duoneb SVN, ProAir with spacer, Dulero, Spiriva; 2l/m oxygen; BIPAP   Intervention Provide education and demonstration as needed of appropriate use of medications, inhalers, and oxygen therapy.   Expected Outcomes Short Term: Achieves understanding of medications use. Understands that oxygen is a medication prescribed by physician. Demonstrates appropriate use of inhaler and oxygen therapy.      Core Components/Risk Factors/Patient Goals Review:      Goals and Risk Factor Review    Row Name 01/30/16 1350 02/11/16 1053 03/24/16 1557 05/20/16 0723       Core Components/Risk Factors/Patient Goals Review   Personal Goals Review Develop more efficient breathing techniques such as purse lipped breathing and diaphragmatic breathing and practicing self-pacing with activity. Develop more efficient breathing techniques such as purse lipped breathing and diaphragmatic breathing and practicing self-pacing with activity.;Improve shortness of breath with ADL's;Weight Management/Obesity Weight Management/Obesity;Sedentary;Increase Strength and Stamina;Improve shortness of breath with ADL's;Develop more  efficient breathing techniques such as purse lipped breathing and diaphragmatic breathing and practicing self-pacing with activity.;Increase knowledge of respiratory medications and ability to use respiratory devices properly. Improve shortness of breath with ADL's;Increase knowledge of respiratory medications and ability to use respiratory devices properly.;Develop more efficient breathing techniques such as purse lipped breathing and diaphragmatic breathing and practicing self-pacing with activity.;Weight Management/Obesity    Review Reviewed pursed lip breathing technique with Markie.  She has used it in the past, but still needs reminding to use during exercise. Patient stated that she has been using PBL and has noticed improvements in SOB during ADL's. She stated she would be interesed in scheduleing an appointment with the dietician. She was given information about making this appointment.  Ms Gallion has attended irregularly due to the illness of her granddaughter who has cancer. She has progressed with her exercise goals.  Ms Wiehe recently had another sleep study and will be getting a new CPAP machine. Wearing the mask does make a difference in her energy level. She has a good understanding  of her MDI's and uses her spacer. She plans to meet with the dietitian in the future. Ms Leas is planning to meet with the dietitian. She is maintaining her weight in the low 140's with a goal of 120's. She has been cutting out sugar and fried foods. Since she has been on her BIPAP machine, Ms Lagerquist states she has felt so much better, including her shortness of breath. She has a good understanding of her inhalers and uses her spacer. She has good technique with her PLB. Even though Ms Clegg is under stress with her granddaughters illness, she has not smoked and continues to use nicotine gym.    Expected Outcomes Short Term:  [name] will become more proficient at using pursed lip breathing.  Long Term:  [name] will be independent in her remembering to use her pursed lip breathing. Patient will continue to use PLB to aid in contorl of SOB. Patient will make an appointment to meet with the dieticain. Continue exercising and continue participating in [place] education as she is able with her granddaughter. Continue making progress with her self-management of her COPD.       Core Components/Risk Factors/Patient Goals at Discharge (Final Review):      Goals and Risk Factor Review - 05/20/16 0723      Core Components/Risk Factors/Patient Goals Review   Personal Goals Review Improve shortness of breath with ADL's;Increase knowledge of respiratory medications and ability to use respiratory devices properly.;Develop more efficient breathing techniques such as purse lipped breathing and diaphragmatic breathing and practicing self-pacing with activity.;Weight Management/Obesity   Review Ms Eiben is planning to meet with the dietitian. She is maintaining her weight in the low 140's with a goal of 120's. She has been cutting out sugar and fried foods. Since she has been on her BIPAP machine, Ms Midgett states she has felt so much better, including her shortness of breath. She has a good understanding of her inhalers and uses her spacer. She has good technique with her PLB. Even though Ms Tates is under stress with her granddaughters illness, she has not smoked and continues to use nicotine gym.   Expected Outcomes Continue making progress with her self-management of her COPD.      ITP Comments:     ITP Comments    Row Name 02/26/16 0732 03/12/16 1459 04/18/16 1214 05/07/16 1544 06/09/16 0732   ITP Comments Ms Staffa states she may be out several sessions. Her grand daughter has stage 4 brain cancer, and Ms Slaght helps with her care, as well as spends time with her much as possible. Ms Cypert left a message with the front desk that she has been sick and expects to be back to [facility] this  week.  [name] has not attended since 04/02/16. Ms Willeford granddaughter is terminally ill and needs help from Ms Holck on a regular basis which affects her ability to attend [facility].  30 day note review by Dr Emily Filbert, Medical Director of [facility]      Comments:  30 day note review by Dr Emily Filbert, Medical Director of [facility]

## 2016-06-11 DIAGNOSIS — J449 Chronic obstructive pulmonary disease, unspecified: Secondary | ICD-10-CM

## 2016-06-11 DIAGNOSIS — J9621 Acute and chronic respiratory failure with hypoxia: Secondary | ICD-10-CM | POA: Diagnosis not present

## 2016-06-11 NOTE — Progress Notes (Signed)
Daily Session Note  Patient Details  Name: JABREA KALLSTROM MRN: [id number] Date of Birth: 1960/12/22 Referring Provider:    Encounter Date: 06/11/2016  Check In:     Session Check In - 06/11/16 1154      Check-In   Location [facility]-Cardiac & Pulmonary Rehab   Staff Present Carson Myrtle, BS, RRT, Respiratory Lennie Hummer, MA, ACSM RCEP, Exercise Physiologist;Idil Maslanka Oletta Darter, BA, ACSM CEP, Exercise Physiologist   Supervising physician immediately available to respond to emergencies [facility] immediately available ER MD   Physician(s) Alfred Levins and Jimmye Norman   Medication changes reported     No   Fall or balance concerns reported    No   Warm-up and Cool-down Performed as group-led instruction   Resistance Training Performed Yes   VAD Patient? No     Pain Assessment   Currently in Pain? No/denies         History  Smoking Status  . Former Smoker  . Packs/day: 0.50  . Years: 40.00  Smokeless Tobacco  . Never Used    Comment: quit smoking 09/26/15    Goals Met:  Proper associated with RPD/PD & O2 Sat Independence with exercise equipment Exercise tolerated well Strength training completed today  Goals Unmet:  Not Applicable  Comments:      [place] Name 01/22/16 1546 06/11/16 1155       6 Minute Walk   Phase  - Mid Program    Distance 990 feet 1020 feet    Distance % Change  - 3 %    Walk Time 6 minutes 6 minutes    # of Rest Breaks 0 0    MPH 1.88 1.93    METS 3.19 3.3    RPE 11 13    Perceived Dyspnea  3 3    VO2 Peak 11.18 11.53    Symptoms No No    Resting HR 110 bpm 103 bpm    Resting BP 108/78 118/68    Max Ex. HR 127 bpm 120 bpm    Max Ex. BP 108/60 128/70      Interval HR   Baseline HR 110 103    1 Minute HR 113 112    2 Minute HR 123 115    3 Minute HR 127 118    4 Minute HR 126 119    5 Minute HR 125 120    6 Minute HR 124 120    Interval Heart Rate? Yes  -      Interval Oxygen   Interval Oxygen? Yes  -    Baseline Oxygen Saturation % 92 % 93 %    Baseline Liters of Oxygen 2 L 3 L    1 Minute Oxygen Saturation % 91 % 95 %    1 Minute Liters of Oxygen 2 L 3 L    2 Minute Oxygen Saturation % 94 % 97 %    2 Minute Liters of Oxygen 2 L 3 L    3 Minute Oxygen Saturation % 95 % 95 %    3 Minute Liters of Oxygen 2 L 3 L    4 Minute Oxygen Saturation % 95 % 94 %    4 Minute Liters of Oxygen 2 L 3 L    5 Minute Oxygen Saturation % 94 % 92 %    5 Minute Liters of Oxygen 2 L 3 L    6 Minute Oxygen Saturation % 93 % 89 %  6 Minute Liters of Oxygen 2 L 3 L    2 Minute Post Liters of Oxygen 2 L 3 L         Dr. Emily Filbert is Medical Director for [facility].

## 2016-06-25 ENCOUNTER — Telehealth: Payer: Self-pay

## 2016-06-25 NOTE — Telephone Encounter (Signed)
[name] [name] has been sick and plans to return Friday.

## 2016-06-27 ENCOUNTER — Encounter: Payer: Medicare Other | Admitting: *Deleted

## 2016-06-27 DIAGNOSIS — J9621 Acute and chronic respiratory failure with hypoxia: Secondary | ICD-10-CM | POA: Diagnosis not present

## 2016-06-27 DIAGNOSIS — J449 Chronic obstructive pulmonary disease, unspecified: Secondary | ICD-10-CM

## 2016-06-27 NOTE — Progress Notes (Signed)
Daily Session Note  Patient Details  Name: [name] [name] MRN: [id number] Date of Birth: Oct 20, 1960 Referring Provider:    Encounter Date: 06/27/2016  Check In:     Session Check In - 06/27/16 1116      Check-In   Location [facility]   Staff Present Nada Maclachlan, BA, ACSM CEP, Exercise Physiologist;Carroll Enterkin, RN, Geralyn Corwin, RN BSN   Supervising physician immediately available to respond to emergencies [facility] immediately available ER MD   Physician(s) Drs. McShane and Quale   Medication changes reported     No   Fall or balance concerns reported    No   Tobacco Cessation No Change   Warm-up and Cool-down Performed as group-led instruction   Resistance Training Performed Yes   VAD Patient? No     Pain Assessment   Currently in Pain? No/denies   Multiple Pain Sites No         History  Smoking Status  . Former Smoker  . Packs/day: 0.50  . Years: 40.00  Smokeless Tobacco  . Never Used    Comment: quit smoking 09/26/15    Goals Met:  Proper associated with RPD/PD & O2 Sat Improved SOB with ADL's Using PLB without cueing & demonstrates good technique Exercise tolerated well Strength training completed today  Goals Unmet:  Not Applicable  Comments: Pt able to follow exercise prescription today without complaint.  Will continue to monitor for progression.    Dr. Emily Filbert is Medical Director for [facility] and [facility].

## 2016-07-04 ENCOUNTER — Encounter: Payer: Medicare Other | Attending: Pulmonary Disease

## 2016-07-04 DIAGNOSIS — J9621 Acute and chronic respiratory failure with hypoxia: Secondary | ICD-10-CM | POA: Insufficient documentation

## 2016-07-07 ENCOUNTER — Encounter: Payer: Self-pay | Admitting: Respiratory Therapy

## 2016-07-07 DIAGNOSIS — J449 Chronic obstructive pulmonary disease, unspecified: Secondary | ICD-10-CM

## 2016-07-07 NOTE — Progress Notes (Signed)
Pulmonary Individual Treatment Plan  Patient Details  Name: [name] [name] MRN: [id number] Date of Birth: 12-04-1960 Referring Provider:    Initial Encounter Date:    Pulmonary Rehab from 01/22/2016 in [facility] Card[facility] Pulmonary Re[facility]  Date  01/22/16      Visit Diagnosis: COPD, severe ([place])  Patient's Home Medications on Admission:  Current Outpatient Prescriptions:    albuterol (PROAIR HFA) 108 (90 Base) MCG/ACT inhaler, INHALE 2 PUFFS BY MOUTH EVERY 6 HOURS AS NEEDED FOR WHEEZING OR SHORTNESS OF BREATH., Disp: 3 Inhaler, Rfl: 3   ALPRAZolam (XANAX) 1 MG tablet, TAKE 1/2 TABLET BY MOUTH THREE TIMES DAILY AS NEEDED FOR ANXIETY, Disp: 30 tablet, Rfl: 5   aspirin 81 MG EC tablet, Take 1 tablet (81 mg total) by mouth daily., Disp: 30 tablet, Rfl: 1   cetirizine (ZYRTEC ALLERGY) 10 MG tablet, Take 1 tablet (10 mg total) by mouth daily., Disp: 90 tablet, Rfl: 3   fluticasone (FLONASE) 50 MCG/ACT nasal spray, Place 1 spray into both nostrils daily., Disp: 1 g, Rfl: 5   guaiFENesin (MUCINEX) 600 MG 12 hr tablet, Take 1 tablet (600 mg total) by mouth 2 (two) times daily as needed for cough or to loosen phlegm., Disp: 20 tablet, Rfl: 0   ipratropium-albuterol (DUONEB) 0.5-2.5 (3) MG/3ML SOLN, Take 3 mLs by nebulization every 4 (four) hours as needed (for wheezing/shortness of breath)., Disp: 360 mL, Rfl: 2   levofloxacin (LEVAQUIN) 500 MG tablet, Take 1 tablet (500 mg total) by mouth daily., Disp: 7 tablet, Rfl: 0   mometasone-formoterol (DULERA) 200-5 MCG/ACT AERO, Inhale 2 puffs into the lungs 2 (two) times daily., Disp: 3 Inhaler, Rfl: 3   predniSONE (DELTASONE) 5 MG tablet, Take 1 tablet (5 mg total) by mouth daily with breakfast., Disp: 30 tablet, Rfl: 5   QUEtiapine (SEROQUEL) 50 MG tablet, Take 1 tablet (50 mg total) by mouth at bedtime., Disp: 30 tablet, Rfl: 0   tiotropium (SPIRIVA) 18 MCG inhalation capsule, Place 1 capsule (18 mcg total) into inhaler and inhale daily.,  Disp: 90 capsule, Rfl: 3  Past Medical History: Past Medical History:  Diagnosis Date   Breast cancer ([place])    Cancer ([place])    COPD (chronic obstructive pulmonary disease) ([place])     Tobacco Use: History  Smoking Status   Former Smoker   Packs/day: 0.50   Years: 40.00  Smokeless Tobacco   Never Used    Comment: quit smoking 09/26/15    Labs: Recent Review Flowsheet Data    Labs for ITP Cardiac and Pulmonary Rehab Latest Ref Rng & Units 05/06/2015 05/08/2015 10/31/2015 10/31/2015 10/31/2015   Cholestrol 0 - 200 mg/dL - - - - -   LDLCALC 0 - 100 mg/dL - - - - -   HDL 40 - 60 mg/dL - - - - -   Trlycerides 0 - 200 mg/dL - - - - -   Hemoglobin A1c 4.8 - 5.6 % - - 5.0 - -   PHART 7.350 - 7.450 7.31(L) 7.36 - - -   PCO2ART 32.0 - 48.0 mmHg 57(H) 66(H) - - -   HCO3 20.0 - 28.0 mmol/L 28.7(H) 37.3(H) - 36.1(H) 35.9(H)   O2SAT % 96.3 96.3 - 98.2 97.4       ADL UCSD:     Pulmonary Assessment Scores    Row Name 01/22/16 1606 07/01/16 1547       ADL UCSD   ADL Phase Entry Mid    SOB Score total 81  92    Rest 0 0    Walk 3 3    Stairs 5 5    Bath 4 4    Dress 3 4    Shop 4 5       Pulmonary Function Assessment:     Pulmonary Function Assessment - 01/22/16 1604      Pulmonary Function Tests   RV% 94 %   DLCO% 37 %     Initial Spirometry Results   FVC% 47 %   FEV1% 28 %   FEV1/FVC Ratio 48   Comments Test date      Post Bronchodilator Spirometry Results   FVC% 44 %   FEV1% 27 %   FEV1/FVC Ratio 49     Breath   Shortness of Breath Yes;Limiting activity;Fear of Shortness of Breath      Exercise Target Goals:    Exercise Program Goal: Individual exercise prescription set with THRR, safety & activity barriers. Participant demonstrates ability to understand and report RPE using BORG scale, to self-measure pulse accurately, and to acknowledge the importance of the exercise prescription.  Exercise Prescription Goal: Starting with aerobic activity 30 plus  minutes a day, 3 days per week for initial exercise prescription. Provide home exercise prescription and guidelines that participant acknowledges understanding prior to discharge.  Activity Barriers & Risk Stratification:     Activity Barriers & Cardiac Risk Stratification - 01/22/16 1604      Activity Barriers & Cardiac Risk Stratification   Activity Barriers Shortness of Breath;Deconditioning;Muscular Weakness   Cardiac Risk Stratification Moderate      6 Minute Walk:     6 Minute Walk    Row Name 01/22/16 1546 06/11/16 1155       6 Minute Walk   Phase  -- Mid Program    Distance 990 feet 1020 feet    Distance % Change  -- 3 %    Walk Time 6 minutes 6 minutes    # of Rest Breaks 0 0    MPH 1.88 1.93    METS 3.19 3.3    RPE 11 13    Perceived Dyspnea  3 3    VO2 Peak 11.18 11.53    Symptoms No No    Resting HR 110 bpm 103 bpm    Resting BP 108/78 118/68    Max Ex. HR 127 bpm 120 bpm    Max Ex. BP 108/60 128/70      Interval HR   Baseline HR 110 103    1 Minute HR 113 112    2 Minute HR 123 115    3 Minute HR 127 118    4 Minute HR 126 119    5 Minute HR 125 120    6 Minute HR 124 120    Interval Heart Rate? Yes  --      Interval Oxygen   Interval Oxygen? Yes  --    Baseline Oxygen Saturation % 92 % 93 %    Baseline Liters of Oxygen 2 L 3 L    1 Minute Oxygen Saturation % 91 % 95 %    1 Minute Liters of Oxygen 2 L 3 L    2 Minute Oxygen Saturation % 94 % 97 %    2 Minute Liters of Oxygen 2 L 3 L    3 Minute Oxygen Saturation % 95 % 95 %    3 Minute Liters of Oxygen 2 L 3 L    4 Minute  Oxygen Saturation % 95 % 94 %    4 Minute Liters of Oxygen 2 L 3 L    5 Minute Oxygen Saturation % 94 % 92 %    5 Minute Liters of Oxygen 2 L 3 L    6 Minute Oxygen Saturation % 93 % 89 %    6 Minute Liters of Oxygen 2 L 3 L    2 Minute Post Liters of Oxygen 2 L 3 L      Oxygen Initial Assessment:     Oxygen Initial Assessment - 04/14/16 0815      Home Oxygen    Home Oxygen Device Portable Concentrator;Home Concentrator   Sleep Oxygen Prescription Continuous   Liters per minute --  2-5   Home Exercise Oxygen Prescription Continuous   Liters per minute --  2-5   Home at Rest Exercise Oxygen Prescription Continuous   Liters per minute --  2-5   Compliance with Home Oxygen Use Yes     Initial 6 min Walk   Oxygen Used Continuous   Liters per minute 2   Resting Oxygen Saturation  during 6 min walk 92 %   Exercise Oxygen Saturation  during 6 min walk 89 %     Program Oxygen Prescription   Program Oxygen Prescription Continuous   Liters per minute --  2-4     Intervention   Short Term Goals To learn and exhibit compliance with exercise, home and travel O2 prescription;To Learn and understand importance of maintaining oxygen saturations>88%;To learn and demonstrate proper use of respiratory medications;To learn and understand importance of monitoring SPO2 with pulse oximeter and demonstrate accurate use of the pulse oximeter.;To learn and demonstrate proper purse lipped breathing techniques or other breathing techniques.   Long  Term Goals Exhibits compliance with exercise, home and travel O2 prescription;Maintenance of O2 saturations>88%;Compliance with respiratory medication;Demonstrates proper use of MDIs;Exhibits proper breathing techniques, such as purse lipped breathing or other method taught during program session;Verbalizes importance of monitoring SPO2 with pulse oximeter and return demonstration      Oxygen Re-Evaluation:     Oxygen Re-Evaluation    Row Name 04/02/16 1516 05/20/16 0720 07/02/16 1555         Program Oxygen Prescription   Program Oxygen Prescription  -- Continuous;Portable Concentrator Continuous;E-Tanks     Liters per minute  -- 2 2     Comments  --  -- Increased to 3l/m with some exercise goals       Home Oxygen   Home Oxygen Device  -- Portable Concentrator;Home Concentrator Portable Concentrator;Home  Concentrator     Sleep Oxygen Prescription  -- Continuous Continuous     Liters per minute  -- 2 2     Home Exercise Oxygen Prescription  -- Continuous Continuous     Liters per minute  -- 2 2  Increased to 3 l/m as needed     Home at Rest Exercise Oxygen Prescription  -- Continuous Continuous     Liters per minute  -- 2 2     Compliance with Home Oxygen Use  -- Yes  --       Goals/Expected Outcomes   Comments Ms Maggio has been walking into [facility] with out her oxygen. We checked her O2Sat and it was 90%. I suggested she bring her oximeter and check her O2Sat's as she comes into [facility].I educated her on the importance of O2Sats in the 9()%'s for the health of her lungs and heart. Ms Schnider  has a good understanding of her oxygen and uses 2-3l/m with exercise in [place]. Ms Verret is very independent with her oxygen. She uses 2 l/m, but she will increase her flow to 3l/m with activity.     Goals/Expected Outcomes  -- Continue using her oxygen with activity and self-monitor her oxygen saturations with her finger oximeter. Continue to self-manage her oxygen with her COPD.        Oxygen Discharge (Final Oxygen Re-Evaluation):     Oxygen Re-Evaluation - 07/02/16 1555      Program Oxygen Prescription   Program Oxygen Prescription Continuous;E-Tanks   Liters per minute 2   Comments Increased to 3l/m with some exercise goals     Home Oxygen   Home Oxygen Device Portable Concentrator;Home Concentrator   Sleep Oxygen Prescription Continuous   Liters per minute 2   Home Exercise Oxygen Prescription Continuous   Liters per minute 2  Increased to 3 l/m as needed   Home at Rest Exercise Oxygen Prescription Continuous   Liters per minute 2     Goals/Expected Outcomes   Comments Ms Uehara is very independent with her oxygen. She uses 2 l/m, but she will increase her flow to 3l/m with activity.   Goals/Expected Outcomes Continue to self-manage her oxygen with her COPD.       Initial Exercise Prescription:     Initial Exercise Prescription - 01/22/16 1500      Date of Initial Exercise RX and Referring Provider   Date 01/22/16     Oxygen   Oxygen Continuous   Liters 2     Treadmill   MPH 1.8   Grade 0.5   Minutes 15     NuStep   Level 3   Minutes 15   METs 3     REL-XR   Level 2   Minutes 15   METs 3     Prescription Details   Frequency (times per week) 3   Duration Progress to 45 minutes of aerobic exercise without signs/symptoms of physical distress     Intensity   THRR 40-80% of Max Heartrate 131-152   Ratings of Perceived Exertion 11-13   Perceived Dyspnea 0-4     Progression   Progression Continue to progress workloads to maintain intensity without signs/symptoms of physical distress.     Resistance Training   Training Prescription Yes   Weight 3      Perform Capillary Blood Glucose checks as needed.  Exercise Prescription Changes:     Exercise Prescription Changes    Row Name 01/30/16 1300 02/07/16 1200 02/22/16 0800 03/07/16 0900 03/31/16 1100     Response to Exercise   Blood Pressure (Admit) 110/68 108/60 116/78 124/60  --   Blood Pressure (Exercise) 126/70 128/62 126/60 108/68  --   Blood Pressure (Exit) 100/60 118/70 122/70 120/70  --   Heart Rate (Admit) 87 bpm 102 bpm 122 bpm 97 bpm  --   Heart Rate (Exercise) 118 bpm 133 bpm 138 bpm 134 bpm  --   Heart Rate (Exit) 100 bpm 108 bpm 106 bpm 123 bpm  --   Oxygen Saturation (Admit) 91 % 95 % 97 % 90 %  --   Oxygen Saturation (Exercise) 93 % 96 % 97 % 99 %  --   Oxygen Saturation (Exit) 98 % 97 % 98 % 98 %  --   Rating of Perceived Exertion (Exercise) '17 19 15 20  ' decreased speed on TM  --   Perceived Dyspnea (  Exercise) '5 8 4 4  ' --   Symptoms panic attack on treadmill  --  --  --  --   Duration Progress to 45 minutes of aerobic exercise without signs/symptoms of physical distress Progress to 45 minutes of aerobic exercise without signs/symptoms of physical  distress Progress to 45 minutes of aerobic exercise without signs/symptoms of physical distress Progress to 45 minutes of aerobic exercise without signs/symptoms of physical distress Progress to 45 minutes of aerobic exercise without signs/symptoms of physical distress   Intensity THRR unchanged THRR unchanged THRR unchanged THRR unchanged THRR unchanged     Progression   Progression Continue to progress workloads to maintain intensity without signs/symptoms of physical distress. Continue to progress workloads to maintain intensity without signs/symptoms of physical distress. Continue to progress workloads to maintain intensity without signs/symptoms of physical distress. Continue to progress workloads to maintain intensity without signs/symptoms of physical distress. Continue to progress workloads to maintain intensity without signs/symptoms of physical distress.   Average METs 2.23  --  --  --  --     Horticulturist, commercial Prescription  -- Yes Yes Yes Yes   Weight  -- '3 3 3 3   ' Reps  -- 70-17 79-39 10-15 10-15     Interval Training   Interval Training  -- No No No No     Oxygen   Oxygen  -- Continuous Continuous Continuous Continuous   Liters  -- '3 3 3 3     ' Treadmill   MPH  -- 1.8  -- 1.5 1.5   Grade  -- 0.5  -- 0.5 0.5   Minutes  -- 15  -- 15 15     NuStep   Level  -- 3  -- 3 3   Minutes  -- 15  -- 15 15   METs  -- 1.7  -- 2.1 2.1     REL-XR   Level  -- 3  --  --  --   Minutes  -- 15  --  --  --   METs  -- 1.8  --  --  --     Home Exercise Plan   Plans to continue exercise at  --  --  --  -- Home (comment)  considering [employer] upon graduation of program   Frequency  --  --  --  -- Add 1 additional day to program exercise sessions.   Initial Home Exercises Provided  --  --  --  -- 03/31/16     Exercise Review   Progression --  First Full Day of Exercise  --  --  --  --   Row Name 04/03/16 1100 05/01/16 1000 05/14/16 1200 05/28/16 1400 06/11/16 1200      Response to Exercise   Blood Pressure (Admit) 110/68 108/60 122/74 122/84 118/68   Blood Pressure (Exercise) 122/74 138/42 130/64 140/70 126/64   Blood Pressure (Exit) 104/62 104/64 134/74 112/66 126/72   Heart Rate (Admit) 123 bpm 107 bpm 94 bpm 89 bpm 103 bpm   Heart Rate (Exercise) 128 bpm 120 bpm 111 bpm 116 bpm 120 bpm   Heart Rate (Exit) 117 bpm 102 bpm 94 bpm 94 bpm 100 bpm   Oxygen Saturation (Admit) 91 % 98 % 97 % 93 % 93 %   Oxygen Saturation (Exercise) 97 % 90 % 99 % 96 % 89 %   Oxygen Saturation (Exit) 96 % 97 % 98 % 98 % 96 %   Rating of  Perceived Exertion (Exercise) '13 13 14 13 13   ' Perceived Dyspnea (Exercise) '3 3 2 3 3   ' Duration Progress to 45 minutes of aerobic exercise without signs/symptoms of physical distress Progress to 45 minutes of aerobic exercise without signs/symptoms of physical distress Progress to 45 minutes of aerobic exercise without signs/symptoms of physical distress Continue with 45 min of aerobic exercise without signs/symptoms of physical distress. Continue with 45 min of aerobic exercise without signs/symptoms of physical distress.   Intensity THRR unchanged THRR unchanged THRR unchanged  -- THRR unchanged     Progression   Progression Continue to progress workloads to maintain intensity without signs/symptoms of physical distress. Continue to progress workloads to maintain intensity without signs/symptoms of physical distress. Continue to progress workloads to maintain intensity without signs/symptoms of physical distress. Continue to progress workloads to maintain intensity without signs/symptoms of physical distress. Continue to progress workloads to maintain intensity without signs/symptoms of physical distress.     Resistance Training   Training Prescription Yes Yes Yes Yes Yes   Weight '3 3 3 3 3   ' Reps 10-15 10-15 10-15 10-15 10-15     Interval Training   Interval Training No No No No No     Oxygen   Oxygen Continuous Continuous Continuous  Continuous Continuous   Liters '3 3 3 3 3     ' Treadmill   MPH  --  --  -- 1.3  --   Grade  --  --  -- 0.5  --   Minutes  --  --  -- 15  --     NuStep   Level '4 5 4  ' -- 4   Minutes '15 15 15  ' -- 15   METs 2.5  -- 2.1  -- 2.1     REL-XR   Level '1 1 1 1 2   ' Minutes '15 15 15 15 15   ' METs 2.6 3 5.7  -- 2.2      Exercise Comments:     Exercise Comments    Row Name 01/30/16 1352 02/22/16 0906 03/07/16 0923 04/03/16 1133 04/18/16 1213   Exercise Comments First full day of exercise!  Patient was oriented to gym and equipment including functions, settings, policies, and procedures.  Patient's individual exercise prescription and treatment plan were reviewed.  All starting workloads were established based on the results of the 6 minute walk test done at initial orientation visit.  The plan for exercise progression was also introduced and progression will be customized based on patient's performance and goals.First full day of exercise!  Patient was oriented to gym and equipment including functions, settings, policies, and procedures.  Patient's individual exercise prescription and treatment plan were reviewed.  All starting workloads were established based on the results of the 6 minute walk test done at initial orientation visit.  The plan for exercise progression was also introduced and progression will be customized based on patient's performance and goals.  Markie did have a small panic attack on the treadmill today.  We talked though it and she recovered and was able to continue to exercise.  We will work on her treadmill tolerance and fears. [name] is progressing well with exercise and has not had any more panic attacks. [name] has missed sessions due to her granddaughter being very ill. [name] has done well with exercise since returning after a break of 3 + weeks due to family health issues. [name] has not attended since 04/02/16.   [place] Name 05/01/16 1105 05/14/16  1230 05/28/16 1412 06/11/16 1211      Exercise Comments Markie tolerates exercise well when she attends.  She has some family conflicts that interfere with regular attendance. [name] has not been able to attend consistently due to other family issues. [name] has tolerated exrecise well and has attended regularly during April. [name] is tolerating exercise well and has been consistent in her attendance in April.       Exercise Goals and Review:     Exercise Goals    Row Name 03/31/16 1115             Exercise Goals   Increase Physical Activity --  Home exercise guidelines reviewed       Intervention --       Expected Outcomes --       Increase Strength and Stamina --  adding 1 day/wk of strength training at home       Intervention --       Expected Outcomes --          Exercise Goals Re-Evaluation :     Exercise Goals Re-Evaluation    [place] Name 03/31/16 1120             Exercise Goal Re-Evaluation   Exercise Goals Review Increase Physical Activity;Increase Strenth and Stamina       Comments Reviewed home exercise guildelines        Expected Outcomes Patient will add 1 day of strength training at home and is considering joining [employer] upon graduation.           Discharge Exercise Prescription (Final Exercise Prescription Changes):     Exercise Prescription Changes - 06/11/16 1200      Response to Exercise   Blood Pressure (Admit) 118/68   Blood Pressure (Exercise) 126/64   Blood Pressure (Exit) 126/72   Heart Rate (Admit) 103 bpm   Heart Rate (Exercise) 120 bpm   Heart Rate (Exit) 100 bpm   Oxygen Saturation (Admit) 93 %   Oxygen Saturation (Exercise) 89 %   Oxygen Saturation (Exit) 96 %   Rating of Perceived Exertion (Exercise) 13   Perceived Dyspnea (Exercise) 3   Duration Continue with 45 min of aerobic exercise without signs/symptoms of physical distress.   Intensity THRR unchanged     Progression   Progression Continue to progress workloads to maintain intensity without  signs/symptoms of physical distress.     Resistance Training   Training Prescription Yes   Weight 3   Reps 10-15     Interval Training   Interval Training No     Oxygen   Oxygen Continuous   Liters 3     NuStep   Level 4   Minutes 15   METs 2.1     REL-XR   Level 2   Minutes 15   METs 2.2      Nutrition:  Target Goals: Understanding of nutrition guidelines, daily intake of sodium <1535m, cholesterol <2034m calories 30% from fat and 7% or less from saturated fats, daily to have 5 or more servings of fruits and vegetables.  Biometrics:     Pre Biometrics - 01/22/16 1546      Pre Biometrics   Height '5\' 3"'  (1.6 m)   Weight 137 lb 12.8 oz (62.5 kg)   Waist Circumference 31 inches   Hip Circumference 38.5 inches   Waist to Hip Ratio 0.81 %   BMI (Calculated) 24.5       Nutrition Therapy Plan and Nutrition  Goals:   Nutrition Discharge: Rate Your Plate Scores:   Nutrition Goals Re-Evaluation:   Nutrition Goals Discharge (Final Nutrition Goals Re-Evaluation):   Psychosocial: Target Goals: Acknowledge presence or absence of significant depression and/or stress, maximize coping skills, provide positive support system. Participant is able to verbalize types and ability to use techniques and skills needed for reducing stress and depression.   Initial Review & Psychosocial Screening:     Initial Psych Review & Screening - 01/22/16 [place]? Yes   Comments Ms Ochs has good family support from her children. She has depression, but not from her COPD and did not offer an explanation. She does go to a support group on Wednesday's. She is very excited about [facility] and is ready "to make a change".     Barriers   Psychosocial barriers to participate in program The patient should benefit from training in stress management and relaxation.     Screening Interventions   Interventions Encouraged to exercise;Program  counselor consult      Quality of Life Scores:     Quality of Life - 01/22/16 1614      Quality of Life Scores   Health/Function Pre 21 %   Socioeconomic Pre 21 %   Psych/Spiritual Pre 21 %   Family Pre 21 %   GLOBAL Pre 21 %      PHQ-9: Recent Review Flowsheet Data    Depression screen [facility] 2/9 01/22/2016   Decreased Interest 2   Down, Depressed, Hopeless 2   PHQ - 2 Score 4   Altered sleeping 3   Tired, decreased energy 3   Change in appetite 0   Feeling bad or failure about yourself  3   Trouble concentrating 1   Moving slowly or fidgety/restless 1   Suicidal thoughts 0   PHQ-9 Score 15   Difficult doing work/chores Very difficult     Interpretation of Total Score  Total Score Depression Severity:  1-4 = Minimal depression, 5-9 = Mild depression, 10-14 = Moderate depression, 15-19 = Moderately severe depression, 20-27 = Severe depression   Psychosocial Evaluation and Intervention:     Psychosocial Evaluation - 03/12/16 1501      Psychosocial Evaluation & Interventions   Comments Counselor met with Ms. B today for initial psychosocial evaluation.  She is a 56 year old who has been struggling with COPD for 7-8 years and is hoping to get on the lung transplant list.  She lives alone but has (3) adult children and other family members who live close by.  She has been able to improve her sleep recently with a diagnosis of Sleep Apnea and use of a Bi-Pap -with 8-10 hours/night of sleep.  Her appetite is good as well.  Ms. B reports a history of depression and has been on medication for this for awhile.  She has had recent stressors that have impacted her mood with her PHQ-9 score of 15 indicating moderate depression.  Counselor processed this with Ms. B reporting her 76 year old granddaughter was recently diagnosed with Stage 4 brain cancer.  Also, Ms. Sharmaine Base best friend recently died.  She copes with exercise; leaning on other family members and attends a support group as well  as sees a Social worker weekly.  Ms. B has goals to breathe better and be able to get on the lung transplant list.  Staff will follow with Ms. B throughout the course of this program.  Psychosocial Re-Evaluation:     Psychosocial Re-Evaluation    [place] Name 02/15/16 1120 03/12/16 1501 03/24/16 1031 05/14/16 1040 05/21/16 1121     Psychosocial Re-Evaluation   Comments Annmargaret called and said she is sorry that she can not exercise today in Pulmonary Rehab since she has to be out with a family emergency.  Ms Ruscitti on her last visit to [place] stated that her granddaughter was battling brain cancer. This has been very difficult, and Ms Pasqua will be with her as much as she can to support her through this illness. Ms Gillentine is relying on her [place] for strength and is praying for a miracle Counselor follow with Ms. Marlon [facility]) today stating it is her first day back in almost a month due to her need to be with family to support during her granddaughter's battle with brain cancer.  [name] states she has not smoked one cigarette through all of this (almost 3 months now) and she is relying on her family and faith to get her through this time.  [name] sister from [place] is visiting currently and this helps somewhat.  Unfortunately, [name] states she is over eating and over-sleeping during this transition as well.  Counselor commended [employer] for using more positive coping strategies and not smoking currently.  Commended her on attending Pulmonary Rehab and taking time for herself.  Also encouraged her to have healthy food options in the home vs. "comfort food" during this time.  Counselor offered supportive services and will continue to follow with her throughout the course of this program.  Counselor follow up with [name] today stating she has been coming sporatically due to her family situation with the granddaughter's health.  She reports her daughter (the mother of her granddaughter)  is not doing well "at all" and she is very concerned about her.  Her other daughter has just taken a leave of absence from work to help [place] and family.  Counselor commended [employer] for even being here today with all that is going on in her life.  She reports her family and faith are her constant supports to help her through this.  Counselor encouraged her to continue to take time for herself so she can be more able to help others.  Counselor and staff will continue to follow with [facility] through this difficult time.   Counselor follow up with [facility] today reporting things are "somewhat better" with her daughter currently as the "[employer] are flying the family - including her granddaughter - to [place] to visit family this upcoming weekend.  They will be accompanied by a nurse as well to help this process.  [name] states she will not be going; but is looking very forward to the break and a weekend of relaxation just for herself!  Counselor commended her on setting healthy boundaries and limits with her family in order to improve her self-care.  Staff will continue to follow with [facility].    Continue Psychosocial Services   --  --  --  -- Follow up required by staff   Row Name 06/04/16 1118 07/02/16 1603           Psychosocial Re-Evaluation   Current issues with Current Stress Concerns  --      Comments [name] came in today looking a little down.  Her granddaughter is back in the hosiptal as the [employer] trip was too much for her.  She said that her granddaughter continues to fight and  is filled with the [place].  Her and [name] have a very special bond.  [name] is trying to be strong for her daughter and granddaughter.  She continues to spend time in prayer and looking upward for guideance.  Her granddaughter is ready and in a good place, her main concern is her daughter and her mental health.  She was headed back over for another visit.  She is going to continue to pray constantly.  --       Expected Outcomes [name] will rely on her faith to maintain her positive outlook.  She will be able to be there for her daughter and still take care of herself. Ms Klingerman continues to care for her daughter and granddaughter. Unfortunately her granddaughter is progressing with her cancer .  With the help of her [employer] faith, Ms Vandehei supports them physically and emotionally . Regular attendence is hard for Ms Beamon. She is commended for her strength is this situation.      Interventions Encouraged to attend Pulmonary Rehabilitation for the exercise;Stress management education  --      Continue Psychosocial Services  Follow up required by staff  --         Psychosocial Discharge (Final Psychosocial Re-Evaluation):     Psychosocial Re-Evaluation - 07/02/16 1603      Psychosocial Re-Evaluation   Expected Outcomes Ms Speich continues to care for her daughter and granddaughter. Unfortunately her granddaughter is progressing with her cancer .  With the help of her [employer] faith, Ms Milliner supports them physically and emotionally . Regular attendence is hard for Ms Hegg. She is commended for her strength is this situation.      Education: Education Goals: Education classes will be provided on a weekly basis, covering required topics. Participant will state understanding/return demonstration of topics presented.  Learning Barriers/Preferences:     Learning Barriers/Preferences - 01/22/16 1604      Learning Barriers/Preferences   Learning Barriers None   Learning Preferences None      Education Topics: Initial Evaluation Education: - Verbal, written and demonstration of respiratory meds, RPE/PD scales, oximetry and breathing techniques. Instruction on use of nebulizers and MDIs: cleaning and proper use, rinsing mouth with steroid doses and importance of monitoring MDI activations.   Pulmonary Rehab from 06/04/2016 in [facility] Cardiac and Pulmonary Rehab  Date  01/22/16   Educator  LB  Instruction Review Code  2- meets goals/outcomes      General Nutrition Guidelines/Fats and Fiber: -Group instruction provided by verbal, written material, models and posters to present the general guidelines for heart healthy nutrition. Gives an explanation and review of dietary fats and fiber.   Pulmonary Rehab from 06/04/2016 in [facility] Cardiac and Pulmonary Rehab  Date  06/02/16  Educator  CR  Instruction Review Code  2- meets goals/outcomes      Controlling Sodium/Reading Food Labels: -Group verbal and written material supporting the discussion of sodium use in heart healthy nutrition. Review and explanation with models, verbal and written materials for utilization of the food label.   Exercise Physiology & Risk Factors: - Group verbal and written instruction with models to review the exercise physiology of the cardiovascular system and associated critical values. Details cardiovascular disease risk factors and the goals associated with each risk factor.   Aerobic Exercise & Resistance Training: - Gives group verbal and written discussion on the health impact of inactivity. On the components of aerobic and resistive training programs and the benefits of this training and how  to safely progress through these programs.   Flexibility, Balance, General Exercise Guidelines: - Provides group verbal and written instruction on the benefits of flexibility and balance training programs. Provides general exercise guidelines with specific guidelines to those with heart or lung disease. Demonstration and skill practice provided.   Stress Management: - Provides group verbal and written instruction about the health risks of elevated stress, cause of high stress, and healthy ways to reduce stress.   Depression: - Provides group verbal and written instruction on the correlation between heart/lung disease and depressed mood, treatment options, and the stigmas associated with seeking  treatment.   Exercise & Equipment Safety: - Individual verbal instruction and demonstration of equipment use and safety with use of the equipment.   Pulmonary Rehab from 06/04/2016 in [facility] Cardiac and Pulmonary Rehab  Date  02/01/16  Educator  C. EnterkinRN  Instruction Review Code  2- meets goals/outcomes      Infection Prevention: - Provides verbal and written material to individual with discussion of infection control including proper hand washing and proper equipment cleaning during exercise session.   Falls Prevention: - Provides verbal and written material to individual with discussion of falls prevention and safety.   Diabetes: - Individual verbal and written instruction to review signs/symptoms of diabetes, desired ranges of glucose level fasting, after meals and with exercise. Advice that pre and post exercise glucose checks will be done for 3 sessions at entry of program.   Chronic Lung Diseases: - Group verbal and written instruction to review new updates, new respiratory medications, new advancements in procedures and treatments. Provide informative websites and "800" numbers of self-education.   Lung Procedures: - Group verbal and written instruction to describe testing methods done to diagnose lung disease. Review the outcome of test results. Describe the treatment choices: Pulmonary Function Tests, ABGs and oximetry.   Energy Conservation: - Provide group verbal and written instruction for methods to conserve energy, plan and organize activities. Instruct on pacing techniques, use of adaptive equipment and posture/positioning to relieve shortness of breath.   Pulmonary Rehab from 06/04/2016 in [facility] Cardiac and Pulmonary Rehab  Date  04/02/16  Educator  [facility]  Instruction Review Code  2- meets goals/outcomes      Triggers: - Group verbal and written instruction to review types of environmental controls: home humidity, furnaces, filters, dust mite/pet prevention, HEPA  vacuums. To discuss weather changes, air quality and the benefits of nasal washing.   Pulmonary Rehab from 06/04/2016 in [facility] Cardiac and Pulmonary Rehab  Date  01/30/16  Educator  LB  Instruction Review Code  2- meets goals/outcomes      Exacerbations: - Group verbal and written instruction to provide: warning signs, infection symptoms, calling MD promptly, preventive modes, and value of vaccinations. Review: effective airway clearance, coughing and/or vibration techniques. Create an Sports administrator.   Pulmonary Rehab from 06/04/2016 in [facility] Cardiac and Pulmonary Rehab  Date  05/21/16  Educator  LB  Instruction Review Code  2- meets goals/outcomes      Oxygen: - Individual and group verbal and written instruction on oxygen therapy. Includes supplement oxygen, available portable oxygen systems, continuous and intermittent flow rates, oxygen safety, concentrators, and Medicare reimbursement for oxygen.   Pulmonary Rehab from 06/04/2016 in [facility] Cardiac and Pulmonary Rehab  Date  01/22/16  Educator  LB  Instruction Review Code  2- meets goals/outcomes      Respiratory Medications: - Group verbal and written instruction to review medications for lung disease. Drug class,  frequency, complications, importance of spacers, rinsing mouth after steroid MDI's, and proper cleaning methods for nebulizers.   Pulmonary Rehab from 06/04/2016 in [facility] Cardiac and Pulmonary Rehab  Date  01/22/16  Educator  LB  Instruction Review Code  2- meets goals/outcomes      AED/CPR: - Group verbal and written instruction with the use of models to demonstrate the basic use of the AED with the basic ABC's of resuscitation.   Breathing Retraining: - Provides individuals verbal and written instruction on purpose, frequency, and proper technique of diaphragmatic breathing and pursed-lipped breathing. Applies individual practice skills.   Pulmonary Rehab from 06/04/2016 in [facility] Cardiac and Pulmonary Rehab  Date  01/22/16   Educator  LB  Instruction Review Code  2- meets goals/outcomes      Anatomy and Physiology of the Lungs: - Group verbal and written instruction with the use of models to provide basic lung anatomy and physiology related to function, structure and complications of lung disease.   Heart Failure: - Group verbal and written instruction on the basics of heart failure: signs/symptoms, treatments, explanation of ejection fraction, enlarged heart and cardiomyopathy.   Sleep Apnea: - Individual verbal and written instruction to review Obstructive Sleep Apnea. Review of risk factors, methods for diagnosing and types of masks and machines for OSA.   Anxiety: - Provides group, verbal and written instruction on the correlation between heart/lung disease and anxiety, treatment options, and management of anxiety.   Pulmonary Rehab from 06/04/2016 in [facility] Cardiac and Pulmonary Rehab  Date  02/13/16  Educator  [facility]  Instruction Review Code  2- Meets goals/outcomes      Relaxation: - Provides group, verbal and written instruction about the benefits of relaxation for patients with heart/lung disease. Also provides patients with examples of relaxation techniques.   Pulmonary Rehab from 06/04/2016 in [facility] Cardiac and Pulmonary Rehab  Date  06/04/16  Educator  [facility]  Instruction Review Code  2- Meets goals/outcomes      Knowledge Questionnaire Score:     Knowledge Questionnaire Score - 01/22/16 1604      Knowledge Questionnaire Score   Pre Score 7/10       Core Components/Risk Factors/Patient Goals at Admission:     Personal Goals and Risk Factors at Admission - 01/22/16 1608      Core Components/Risk Factors/Patient Goals on Admission    Weight Management Yes  Plans to meet with the dietitian; has gained 17lbs - steroids have increased her appetite   Intervention Weight Management: Develop a combined nutrition and exercise program designed to reach desired caloric intake, while maintaining  appropriate intake of nutrient and fiber, sodium and fats, and appropriate energy expenditure required for the weight goal.;Weight Management: Provide education and appropriate resources to help participant work on and attain dietary goals.;Weight Management/Obesity: Establish reasonable short term and long term weight goals.   Admit Weight 137 lb 12.8 oz (62.5 kg)   Goal Weight: Short Term 132 lb (59.9 kg)   Goal Weight: Long Term 120 lb (54.4 kg)   Expected Outcomes Short Term: Continue to assess and modify interventions until short term weight is achieved;Long Term: Adherence to nutrition and physical activity/exercise program aimed toward attainment of established weight goal;Weight Maintenance: Understanding of the daily nutrition guidelines, which includes 25-35% calories from fat, 7% or less cal from saturated fats, less than 239m cholesterol, less than 1.5gm of sodium, & 5 or more servings of fruits and vegetables daily;Weight Loss: Understanding of general recommendations for a balanced  deficit meal plan, which promotes 1-2 lb weight loss per week and includes a negative energy balance of [phone] kcal/d;Understanding recommendations for meals to include 15-35% energy as protein, 25-35% energy from fat, 35-60% energy from carbohydrates, less than 272m of dietary cholesterol, 20-35 gm of total fiber daily;Understanding of distribution of calorie intake throughout the day with the consumption of 4-5 meals/snacks   Sedentary Yes  Preparing for possible Lung Transplant surgery   Intervention Provide advice, education, support and counseling about physical activity/exercise needs.;Develop an individualized exercise prescription for aerobic and resistive training based on initial evaluation findings, risk stratification, comorbidities and participant's personal goals.   Expected Outcomes Achievement of increased cardiorespiratory fitness and enhanced flexibility, muscular endurance and strength shown  through measurements of functional capacity and personal statement of participant.   Increase Strength and Stamina Yes   Intervention Provide advice, education, support and counseling about physical activity/exercise needs.;Develop an individualized exercise prescription for aerobic and resistive training based on initial evaluation findings, risk stratification, comorbidities and participant's personal goals.   Expected Outcomes Achievement of increased cardiorespiratory fitness and enhanced flexibility, muscular endurance and strength shown through measurements of functional capacity and personal statement of participant.   Improve shortness of breath with ADL's Yes   Intervention Provide education, individualized exercise plan and daily activity instruction to help decrease symptoms of SOB with activities of daily living.   Expected Outcomes Short Term: Achieves a reduction of symptoms when performing activities of daily living.   Develop more efficient breathing techniques such as purse lipped breathing and diaphragmatic breathing; and practicing self-pacing with activity Yes   Intervention Provide education, demonstration and support about specific breathing techniuqes utilized for more efficient breathing. Include techniques such as pursed lipped breathing, diaphragmatic breathing and self-pacing activity.   Expected Outcomes Short Term: Participant will be able to demonstrate and use breathing techniques as needed throughout daily activities.   Increase knowledge of respiratory medications and ability to use respiratory devices properly  Yes  Duoneb SVN, ProAir with spacer, Dulero, Spiriva; 2l/m oxygen; BIPAP   Intervention Provide education and demonstration as needed of appropriate use of medications, inhalers, and oxygen therapy.   Expected Outcomes Short Term: Achieves understanding of medications use. Understands that oxygen is a medication prescribed by physician. Demonstrates appropriate  use of inhaler and oxygen therapy.      Core Components/Risk Factors/Patient Goals Review:      Goals and Risk Factor Review    Row Name 01/30/16 1350 02/11/16 1053 03/24/16 1557 05/20/16 0723 07/02/16 1559     Core Components/Risk Factors/Patient Goals Review   Personal Goals Review Develop more efficient breathing techniques such as purse lipped breathing and diaphragmatic breathing and practicing self-pacing with activity. Develop more efficient breathing techniques such as purse lipped breathing and diaphragmatic breathing and practicing self-pacing with activity.;Improve shortness of breath with ADL's;Weight Management/Obesity Weight Management/Obesity;Sedentary;Increase Strength and Stamina;Improve shortness of breath with ADL's;Develop more efficient breathing techniques such as purse lipped breathing and diaphragmatic breathing and practicing self-pacing with activity.;Increase knowledge of respiratory medications and ability to use respiratory devices properly. Improve shortness of breath with ADL's;Increase knowledge of respiratory medications and ability to use respiratory devices properly.;Develop more efficient breathing techniques such as purse lipped breathing and diaphragmatic breathing and practicing self-pacing with activity.;Weight Management/Obesity  --   Review Reviewed pursed lip breathing technique with Markie.  She has used it in the past, but still needs reminding to use during exercise. Patient stated that she has been using PBL and has  noticed improvements in SOB during ADL's. She stated she would be interesed in scheduleing an appointment with the dietician. She was given information about making this appointment.  Ms Derringer has attended irregularly due to the illness of her granddaughter who has cancer. She has progressed with her exercise goals.  Ms Maskell recently had another sleep study and will be getting a new CPAP machine. Wearing the mask does make a difference in  her energy level. She has a good understanding of her MDI's and uses her spacer. She plans to meet with the dietitian in the future. Ms Moncada is planning to meet with the dietitian. She is maintaining her weight in the low 140's with a goal of 120's. She has been cutting out sugar and fried foods. Since she has been on her BIPAP machine, Ms Weinheimer states she has felt so much better, including her shortness of breath. She has a good understanding of her inhalers and uses her spacer. She has good technique with her PLB. Even though Ms Guedes is under stress with her granddaughters illness, she has not smoked and continues to use nicotine gym. Ms Criscione has only been able to attend 3 sessions in this 30 day period. Her granddaughter is very ill with cancer, and Ms Laury has missed several sessions due to her own health.    Expected Outcomes Short Term:  [name] will become more proficient at using pursed lip breathing.  Long Term: [name] will be independent in her remembering to use her pursed lip breathing. Patient will continue to use PLB to aid in contorl of SOB. Patient will make an appointment to meet with the dieticain. Continue exercising and continue participating in [place] education as she is able with her granddaughter. Continue making progress with her self-management of her COPD. Continue in [place] with regular attendence.      Core Components/Risk Factors/Patient Goals at Discharge (Final Review):      Goals and Risk Factor Review - 07/02/16 1559      Core Components/Risk Factors/Patient Goals Review   Review Ms Lute has only been able to attend 3 sessions in this 30 day period. Her granddaughter is very ill with cancer, and Ms Sokolow has missed several sessions due to her own health.    Expected Outcomes Continue in [place] with regular attendence.      ITP Comments:     ITP Comments    Row Name 02/26/16 0732 03/12/16 1459 04/18/16 1214 05/07/16 1544 06/09/16  0732   ITP Comments Ms Qadir states she may be out several sessions. Her grand daughter has stage 4 brain cancer, and Ms Lal helps with her care, as well as spends time with her much as possible. Ms Truluck left a message with the front desk that she has been sick and expects to be back to [facility] this week.  [name] has not attended since 04/02/16. Ms Brabant granddaughter is terminally ill and needs help from Ms Peale on a regular basis which affects her ability to attend [facility].  30 day note review by Dr Emily Filbert, Medical Director of [place] Name 07/07/16 [phone]           ITP Comments 30 day note review by Dr Emily Filbert, Medical Director of [facility]          Comments: 30 day note review by Dr Emily Filbert, Medical Director of [place]

## 2016-07-22 ENCOUNTER — Emergency Department
Admission: EM | Admit: 2016-07-22 | Discharge: 2016-07-22 | Disposition: A | Discharge status: Home / Self Care | Payer: Medicare Other | Attending: Emergency Medicine | Admitting: Emergency Medicine

## 2016-07-22 ENCOUNTER — Other Ambulatory Visit: Payer: Self-pay

## 2016-07-22 ENCOUNTER — Emergency Department: Payer: Medicare Other

## 2016-07-22 DIAGNOSIS — J449 Chronic obstructive pulmonary disease, unspecified: Secondary | ICD-10-CM | POA: Diagnosis not present

## 2016-07-22 DIAGNOSIS — Z7951 Long term (current) use of inhaled steroids: Secondary | ICD-10-CM | POA: Insufficient documentation

## 2016-07-22 DIAGNOSIS — Z87891 Personal history of nicotine dependence: Secondary | ICD-10-CM | POA: Diagnosis not present

## 2016-07-22 DIAGNOSIS — R05 Cough: Secondary | ICD-10-CM | POA: Insufficient documentation

## 2016-07-22 DIAGNOSIS — Z79899 Other long term (current) drug therapy: Secondary | ICD-10-CM | POA: Diagnosis not present

## 2016-07-22 DIAGNOSIS — Z853 Personal history of malignant neoplasm of breast: Secondary | ICD-10-CM | POA: Insufficient documentation

## 2016-07-22 DIAGNOSIS — Z7982 Long term (current) use of aspirin: Secondary | ICD-10-CM | POA: Diagnosis not present

## 2016-07-22 DIAGNOSIS — R0789 Other chest pain: Secondary | ICD-10-CM | POA: Insufficient documentation

## 2016-07-22 DIAGNOSIS — M549 Dorsalgia, unspecified: Secondary | ICD-10-CM | POA: Diagnosis present

## 2016-07-22 LAB — CBC WITH DIFFERENTIAL/PLATELET
Basophils Absolute: 0.1 10*3/uL (ref 0–0.1)
Basophils Relative: 1 %
EOS ABS: 0.4 10*3/uL (ref 0–0.7)
Eosinophils Relative: 4 %
HCT: 38.5 % (ref 35.0–47.0)
HEMOGLOBIN: 12.7 g/dL (ref 12.0–16.0)
LYMPHS ABS: 2.6 10*3/uL (ref 1.0–3.6)
Lymphocytes Relative: 24 %
MCH: 27.9 pg (ref 26.0–34.0)
MCHC: 32.9 g/dL (ref 32.0–36.0)
MCV: 84.6 fL (ref 80.0–100.0)
MONO ABS: 0.8 10*3/uL (ref 0.2–0.9)
MONOS PCT: 7 %
NEUTROS PCT: 64 %
Neutro Abs: 7 10*3/uL — ABNORMAL HIGH (ref 1.4–6.5)
Platelets: 299 10*3/uL (ref 150–440)
RBC: 4.55 MIL/uL (ref 3.80–5.20)
RDW: 12.9 % (ref 11.5–14.5)
WBC: 10.9 10*3/uL (ref 3.6–11.0)

## 2016-07-22 LAB — BASIC METABOLIC PANEL
Anion gap: 5 (ref 5–15)
BUN: 23 mg/dL — AB (ref 6–20)
CHLORIDE: 101 mmol/L (ref 101–111)
CO2: 34 mmol/L — AB (ref 22–32)
CREATININE: 0.8 mg/dL (ref 0.44–1.00)
Calcium: 8.5 mg/dL — ABNORMAL LOW (ref 8.9–10.3)
GFR calc Af Amer: >60 mL/min (ref >60)
GFR calc non Af Amer: >60 mL/min (ref >60)
Glucose, Bld: 94 mg/dL (ref 65–99)
Potassium: 3.9 mmol/L (ref 3.5–5.1)
SODIUM: 140 mmol/L (ref 135–145)

## 2016-07-22 LAB — TROPONIN I

## 2016-07-22 MED ORDER — KETOROLAC TROMETHAMINE 30 MG/ML IJ SOLN
30.0000 mg | Freq: Once | INTRAMUSCULAR | Status: AC
  Administered 2016-07-22: 30 mg via INTRAVENOUS
  Filled 2016-07-22: qty 1

## 2016-07-22 MED ORDER — IOPAMIDOL (ISOVUE-370) INJECTION 76%
75.0000 mL | Freq: Once | INTRAVENOUS | Status: AC | PRN
  Administered 2016-07-22: 75 mL via INTRAVENOUS

## 2016-07-22 MED ORDER — SODIUM CHLORIDE 0.9 % IV BOLUS (SEPSIS)
500.0000 mL | Freq: Once | INTRAVENOUS | Status: AC
  Administered 2016-07-22: 500 mL via INTRAVENOUS

## 2016-07-22 NOTE — ED Notes (Signed)
Pt returned from CT at this time.  

## 2016-07-22 NOTE — ED Notes (Signed)
Pt. Verbalizes understanding of d/c instructions and follow-up. VS stable and pain controlled per pt.  Pt. In NAD at time of d/c and denies further concerns regarding this visit. Pt. Stable at the time of departure from the unit, departing unit by the safest and most appropriate manner per that pt condition and limitations. Pt advised to return to the ED at any time for emergent concerns, or for new/worsening symptoms.   

## 2016-07-22 NOTE — ED Notes (Signed)
Pt d/c to lobby in wheelchair, waiting for ride. [employer] and first nurse, Amy, RN aware.

## 2016-07-22 NOTE — ED Provider Notes (Signed)
[facility] Emergency Department Provider Note  ____________________________________________   First MD Initiated Contact with Patient 07/22/16 2042     (approximate)  I have reviewed the triage vital signs and the nursing notes.   HISTORY  Chief Complaint Back Pain and Cough   HPI [name] [name] is a 56 y.o. female with a history of COPD as well as breast cancer was presenting to the emergency department with right-sided back and right-sided lateral thoracic pain over the past week. Says that the pain is aching and moderate to severe especially with coughing and deep breathing. She says that she did do some heavy lifting prior to the onset of the pain. However, the pain has persisted despite Tylenol use at home. Says that she has a cough. The cough is unchanged and consistent with her chronic COPD.   Past Medical History:  Diagnosis Date  . Breast cancer ([place])   . Cancer ([place])   . COPD (chronic obstructive pulmonary disease) [facility])     Patient Active Problem List   Diagnosis Date Noted  . Acute on chronic respiratory failure with hypoxia ([place]) 01/04/2016  . Leukocytosis 01/04/2016  . COPD (chronic obstructive pulmonary disease) ([place]) 01/04/2016  . History of breast cancer 11/13/2015  . Shingles 11/13/2015  . Persistent depressive disorder 11/11/2015  . PTSD (post-traumatic stress disorder) 11/11/2015  . Respiratory failure with hypercapnia ([place]) 10/31/2015  . Anxiety 05/06/2015  . Smoker 05/06/2015  . Emphysema lung ([place]) 05/06/2015  . Acute on chronic respiratory failure ([place]) 07/15/2014  . COPD exacerbation ([place]) 07/15/2014  . Acute bronchitis 07/15/2014  . Tachycardia 07/15/2014    Past Surgical History:  Procedure Laterality Date  . ABDOMINAL HYSTERECTOMY      Prior to Admission medications   Medication Sig Start Date End Date Taking? Authorizing Provider  albuterol (PROAIR HFA) 108 (90 Base) MCG/ACT inhaler INHALE 2 PUFFS BY  MOUTH EVERY 6 HOURS AS NEEDED FOR WHEEZING OR SHORTNESS OF BREATH. 05/19/16  Yes Kasa, Maretta Bees, MD  ALPRAZolam (XANAX) 1 MG tablet TAKE 1/2 TABLET BY MOUTH THREE TIMES DAILY AS NEEDED FOR ANXIETY 04/25/16  Yes Flora Lipps, MD  aspirin 81 MG EC tablet Take 1 tablet (81 mg total) by mouth daily. 05/14/15  Yes Demetrios Loll, MD  fluticasone [facility]) 50 MCG/ACT nasal spray Place 1 spray into both nostrils daily. 05/19/16 05/19/17 Yes Kasa, Maretta Bees, MD  guaiFENesin (MUCINEX) 600 MG 12 hr tablet Take 1 tablet (600 mg total) by mouth 2 (two) times daily as needed for cough or to loosen phlegm. 05/14/15  Yes Demetrios Loll, MD  ipratropium-albuterol (DUONEB) 0.5-2.5 (3) MG/3ML SOLN Take 3 mLs by nebulization every 4 (four) hours as needed (for wheezing/shortness of breath). 04/11/16  Yes Kasa, Maretta Bees, MD  levocetirizine (XYZAL) 5 MG tablet Take 5 mg by mouth every evening.   Yes [provider]  mometasone-formoterol (DULERA) 200-5 MCG/ACT AERO Inhale 2 puffs into the lungs 2 (two) times daily. 05/19/16  Yes Flora Lipps, MD  predniSONE (DELTASONE) 5 MG tablet Take 1 tablet (5 mg total) by mouth daily with breakfast. 02/12/16  Yes Kasa, Maretta Bees, MD  QUEtiapine (SEROQUEL) 50 MG tablet Take 1 tablet (50 mg total) by mouth at bedtime. 05/14/15  Yes Demetrios Loll, MD  tiotropium (SPIRIVA) 18 MCG inhalation capsule Place 1 capsule (18 mcg total) into inhaler and inhale daily. 05/19/16  Yes Flora Lipps, MD  cetirizine (ZYRTEC ALLERGY) 10 MG tablet Take 1 tablet (10 mg total) by mouth daily. 01/11/16  Wilhelmina Mcardle, MD  levofloxacin (LEVAQUIN) 500 MG tablet Take 1 tablet (500 mg total) by mouth daily. Patient not taking: Reported on 07/22/2016 05/19/16   Flora Lipps, MD    Allergies Zithromax [azithromycin] and Penicillins  Family History  Problem Relation Age of Onset  . Breast cancer Mother   . Lung cancer Mother     Social History Social History  Substance Use Topics  . Smoking status: Former Smoker     Packs/day: 0.50    Years: 40.00  . Smokeless tobacco: Never Used     Comment: quit smoking 09/26/15  . Alcohol use No    Review of Systems  Constitutional: No fever/chills Eyes: No visual changes. ENT: No sore throat. Cardiovascular: Denies chest pain. Respiratory: Denies shortness of breath. Gastrointestinal: No abdominal pain.  No nausea, no vomiting.  No diarrhea.  No constipation. Genitourinary: Negative for dysuria. Musculoskeletal: Negative for back pain. Skin: Negative for rash. Neurological: Negative for headaches, focal weakness or numbness.   ____________________________________________   PHYSICAL EXAM:  VITAL SIGNS: ED Triage Vitals  Enc Vitals Group     BP 07/22/16 2016 (!) 130/92     Pulse Rate 07/22/16 2016 (!) 116     Resp 07/22/16 2016 (!) 24     Temp 07/22/16 2016 98.1 F (36.7 C)     Temp Source 07/22/16 2016 Oral     SpO2 07/22/16 2016 (!) 89 %     Weight 07/22/16 2017 145 lb (65.8 kg)     Height 07/22/16 2017 5\' 2"  (1.575 m)     Head Circumference --      Peak Flow --      Pain Score 07/22/16 2016 8     Pain Loc --      Pain Edu? --      Excl. in [place]? --     Constitutional: Alert and oriented. Well appearing and in no acute distress. Eyes: Conjunctivae are normal.  Head: Atraumatic. Nose: No congestion/rhinnorhea. Mouth/Throat: Mucous membranes are moist.  Neck: No stridor.   Cardiovascular: tachycardic to 104bpm, regular rhythm. Grossly normal heart sounds.   Respiratory: Normal respiratory effort.  No retractions. Lungs CTAB. Gastrointestinal: Soft and nontender. No distention. No CVA tenderness. Musculoskeletal: No lower extremity tenderness nor edema.  No joint effusions.  Tenderness palpation to the right axilla. No rash visualized. No deformity. No ecchymosis.  Neurologic:  Normal speech and language. No gross focal neurologic deficits are appreciated. Skin:  Skin is warm, dry and intact. No rash noted. Psychiatric: Mood and affect  are normal. Speech and behavior are normal.  ____________________________________________   LABS (all labs ordered are listed, but only abnormal results are displayed)  Labs Reviewed  CBC WITH DIFFERENTIAL/PLATELET - Abnormal; Notable for the following:       Result Value   Neutro Abs 7.0 (*)    All other components within normal limits  BASIC METABOLIC PANEL - Abnormal; Notable for the following:    CO2 34 (*)    BUN 23 (*)    Calcium 8.5 (*)    All other components within normal limits  TROPONIN I   ____________________________________________  EKG  ED ECG REPORT I, Doran Stabler, the attending physician, personally viewed and interpreted this ECG.   Date: 07/22/2016  EKG Time: 2022  Rate: 111  Rhythm: sinus tachycardia  Axis: Normal  Intervals:none  ST&T Change: No ST segment elevation or depression. No abnormal T-wave inversion.  ____________________________________________  RADIOLOGY  No acute cardiopulmonary  process seen on the chest x-ray.  CT angiography without any acute process. ____________________________________________   PROCEDURES  Procedure(s) performed:   Procedures  Critical Care performed:   ____________________________________________   INITIAL IMPRESSION / ASSESSMENT AND PLAN / ED COURSE  Pertinent labs & imaging results that were available during my care of the patient were reviewed by me and considered in my medical decision making (see chart for details).  ----------------------------------------- 11:33 PM on 07/22/2016 -----------------------------------------  Patient pain-free now after Toradol. Reassuring labs as well as imaging. Heart rate on my reevaluation is 94 bpm. Advised patient to continue taking her Tylenol and to try a topical salves disaster MRIs. Likely chest wall pain. Patient to be discharged to home. She is understanding the plan and willing to comply. We also reviewed the images as well as lab  results.      ____________________________________________   FINAL CLINICAL IMPRESSION(S) / ED DIAGNOSES  Chest wall pain.    NEW MEDICATIONS STARTED DURING THIS VISIT:  New Prescriptions   No medications on file     Note:  This document was prepared using Dragon voice recognition software and may include unintentional dictation errors.     Orbie Pyo, MD 07/22/16 [phone]

## 2016-07-22 NOTE — ED Triage Notes (Addendum)
Pt to triage via w/c with no distress noted; st x week having pain to right scapula radiating around into right side chest; st pain increases with deep breathing and movement of arm; hx COPD, prod cough clear sputum; st recently exposed to mold in house

## 2016-07-23 ENCOUNTER — Telehealth: Payer: Self-pay

## 2016-07-23 NOTE — Telephone Encounter (Signed)
LMOM

## 2016-08-04 DIAGNOSIS — J449 Chronic obstructive pulmonary disease, unspecified: Secondary | ICD-10-CM

## 2016-08-04 NOTE — Progress Notes (Signed)
Pulmonary Individual Treatment Plan  Patient Details  Name: [name] [name] MRN: [id number] Date of Birth: [birth year] Referring Provider:    Initial Encounter Date:    Pulmonary Rehab from 01/22/2016 in [facility]  Date  01/22/16      Visit Diagnosis: COPD, severe ([place])  Patient's Home Medications on Admission:  Current Outpatient Prescriptions:  .  albuterol (PROAIR HFA) 108 (90 [name]) MCG/ACT inhaler, INHALE 2 PUFFS BY MOUTH EVERY 6 HOURS AS NEEDED FOR WHEEZING OR SHORTNESS OF BREATH., Disp: 3 Inhaler, Rfl: 3 .  ALPRAZolam (XANAX) 1 MG tablet, TAKE 1/2 TABLET BY MOUTH THREE TIMES DAILY AS NEEDED FOR ANXIETY, Disp: 30 tablet, Rfl: 5 .  aspirin 81 MG EC tablet, Take 1 tablet (81 mg total) by mouth daily., Disp: 30 tablet, Rfl: 1 .  cetirizine (ZYRTEC ALLERGY) 10 MG tablet, Take 1 tablet (10 mg total) by mouth daily., Disp: 90 tablet, Rfl: 3 .  fluticasone (FLONASE) 50 MCG/ACT nasal spray, Place 1 spray into both nostrils daily., Disp: 1 g, Rfl: 5 .  guaiFENesin (MUCINEX) 600 MG 12 hr tablet, Take 1 tablet (600 mg total) by mouth 2 (two) times daily as needed for cough or to loosen phlegm., Disp: 20 tablet, Rfl: 0 .  ipratropium-albuterol (DUONEB) 0.5-2.5 (3) MG/3ML SOLN, Take 3 mLs by nebulization every 4 (four) hours as needed (for wheezing/shortness of breath)., Disp: 360 mL, Rfl: 2 .  levocetirizine (XYZAL) 5 MG tablet, Take 5 mg by mouth every evening., Disp: , Rfl:  .  levofloxacin (LEVAQUIN) 500 MG tablet, Take 1 tablet (500 mg total) by mouth daily. (Patient not taking: Reported on 07/22/2016), Disp: 7 tablet, Rfl: 0 .  mometasone-formoterol (DULERA) 200-5 MCG/ACT AERO, Inhale 2 puffs into the lungs 2 (two) times daily., Disp: 3 Inhaler, Rfl: 3 .  predniSONE (DELTASONE) 5 MG tablet, Take 1 tablet (5 mg total) by mouth daily with breakfast., Disp: 30 tablet, Rfl: 5 .  QUEtiapine (SEROQUEL) 50 MG tablet, Take 1 tablet (50 mg total) by mouth at bedtime., Disp:  30 tablet, Rfl: 0 .  tiotropium (SPIRIVA) 18 MCG inhalation capsule, Place 1 capsule (18 mcg total) into inhaler and inhale daily., Disp: 90 capsule, Rfl: 3  Past Medical History: Past Medical History:  Diagnosis Date  . Breast cancer ([place])   . Cancer ([place])   . COPD (chronic obstructive pulmonary disease) ([facility])     Tobacco Use: History  Smoking Status  . Former Smoker  . Packs/day: 0.50  . Years: 40.00  Smokeless Tobacco  . Never Used    Comment: quit smoking 09/26/15    Labs: Recent Review Flowsheet Data    Labs for ITP Cardiac and Pulmonary Rehab Latest Ref Rng & Units 05/06/2015 05/08/2015 10/31/2015 10/31/2015 10/31/2015   Cholestrol 0 - 200 mg/dL - - - - -   LDLCALC 0 - 100 mg/dL - - - - -   HDL 40 - 60 mg/dL - - - - -   Trlycerides 0 - 200 mg/dL - - - - -   Hemoglobin A1c 4.8 - 5.6 % - - 5.0 - -   PHART 7.350 - 7.450 7.31(L) 7.36 - - -   PCO2ART 32.0 - 48.0 mmHg 57(H) 66(H) - - -   HCO3 20.0 - 28.0 mmol/L 28.7(H) 37.3(H) - 36.1(H) 35.9(H)   O2SAT % 96.3 96.3 - 98.2 97.4       ADL UCSD:     Pulmonary Assessment Scores    Row Name 07/01/16  1547         ADL UCSD   ADL Phase Mid     SOB Score total 92     Rest 0     Walk 3     Stairs 5     Bath 4     Dress 4     Shop 5        Pulmonary Function Assessment:   Exercise Target Goals:    Exercise Program Goal: Individual exercise prescription set with THRR, safety & activity barriers. Participant demonstrates ability to understand and report RPE using BORG scale, to self-measure pulse accurately, and to acknowledge the importance of the exercise prescription.  Exercise Prescription Goal: Starting with aerobic activity 30 plus minutes a day, 3 days per week for initial exercise prescription. Provide home exercise prescription and guidelines that participant acknowledges understanding prior to discharge.  Activity Barriers & Risk Stratification:   6 Minute Walk:     6 Minute Walk    Row Name 06/11/16  1155         6 Minute Walk   Phase Mid Program     Distance 1020 feet     Distance % Change 3 %     Walk Time 6 minutes     # of Rest Breaks 0     MPH 1.93     METS 3.3     RPE 13     Perceived Dyspnea  3     VO2 Peak 11.53     Symptoms No     Resting HR 103 bpm     Resting BP 118/68     Max Ex. HR 120 bpm     Max Ex. BP 128/70       Interval HR   Baseline HR 103     1 Minute HR 112     2 Minute HR 115     3 Minute HR 118     4 Minute HR 119     5 Minute HR 120     6 Minute HR 120       Interval Oxygen   Baseline Oxygen Saturation % 93 %     Baseline Liters of Oxygen 3 L     1 Minute Oxygen Saturation % 95 %     1 Minute Liters of Oxygen 3 L     2 Minute Oxygen Saturation % 97 %     2 Minute Liters of Oxygen 3 L     3 Minute Oxygen Saturation % 95 %     3 Minute Liters of Oxygen 3 L     4 Minute Oxygen Saturation % 94 %     4 Minute Liters of Oxygen 3 L     5 Minute Oxygen Saturation % 92 %     5 Minute Liters of Oxygen 3 L     6 Minute Oxygen Saturation % 89 %     6 Minute Liters of Oxygen 3 L     2 Minute Post Liters of Oxygen 3 L       Oxygen Initial Assessment:     Oxygen Initial Assessment - 04/14/16 0815      Home Oxygen   Home Oxygen Device Portable Concentrator;Home Concentrator   Sleep Oxygen Prescription Continuous   Liters per minute --  2-5   Home Exercise Oxygen Prescription Continuous   Liters per minute --  2-5   Home at Rest Exercise Oxygen Prescription Continuous  Liters per minute --  2-5   Compliance with Home Oxygen Use Yes     Initial 6 min Walk   Oxygen Used Continuous   Liters per minute 2   Resting Oxygen Saturation  during 6 min walk 92 %   Exercise Oxygen Saturation  during 6 min walk 89 %     Program Oxygen Prescription   Program Oxygen Prescription Continuous   Liters per minute --  2-4     Intervention   Short Term Goals To learn and exhibit compliance with exercise, home and travel O2 prescription;To Learn  and understand importance of maintaining oxygen saturations>88%;To learn and demonstrate proper use of respiratory medications;To learn and understand importance of monitoring SPO2 with pulse oximeter and demonstrate accurate use of the pulse oximeter.;To learn and demonstrate proper purse lipped breathing techniques or other breathing techniques.   Long  Term Goals Exhibits compliance with exercise, home and travel O2 prescription;Maintenance of O2 saturations>88%;Compliance with respiratory medication;Demonstrates proper use of MDI's;Exhibits proper breathing techniques, such as purse lipped breathing or other method taught during program session;Verbalizes importance of monitoring SPO2 with pulse oximeter and return demonstration      Oxygen Re-Evaluation:     Oxygen Re-Evaluation    Row Name 04/02/16 1516 05/20/16 0720 07/02/16 1555         Program Oxygen Prescription   Program Oxygen Prescription  - Continuous;Portable Concentrator Continuous;E-Tanks     Liters per minute  - 2 2     Comments  -  - Increased to 3l/m with some exercise goals       Home Oxygen   Home Oxygen Device  - Portable Concentrator;Home Concentrator Portable Concentrator;Home Concentrator     Sleep Oxygen Prescription  - Continuous Continuous     Liters per minute  - 2 2     Home Exercise Oxygen Prescription  - Continuous Continuous     Liters per minute  - 2 2  Increased to 3 l/m as needed     Home at Rest Exercise Oxygen Prescription  - Continuous Continuous     Liters per minute  - 2 2     Compliance with Home Oxygen Use  - Yes  -       Goals/Expected Outcomes   Comments [name] [name] has been walking into [facility] with out her oxygen. We checked her O2Sat and it was 90%. I suggested she bring her oximeter and check her O2Sat's as she comes into [facility].I educated her on the importance of O2Sats in the 9()%'s for the health of her lungs and heart. [name] [name] has a good understanding of her oxygen and  uses 2-3l/m with exercise in [place]. [name] [name] is very independent with her oxygen. She uses 2 l/m, but she will increase her flow to 3l/m with activity.     Goals/Expected Outcomes  - Continue using her oxygen with activity and self-monitor her oxygen saturations with her finger oximeter. Continue to self-manage her oxygen with her COPD.        Oxygen Discharge (Final Oxygen Re-Evaluation):     Oxygen Re-Evaluation - 07/02/16 1555      Program Oxygen Prescription   Program Oxygen Prescription Continuous;E-Tanks   Liters per minute 2   Comments Increased to 3l/m with some exercise goals     Home Oxygen   Home Oxygen Device Portable Concentrator;Home Concentrator   Sleep Oxygen Prescription Continuous   Liters per minute 2   Home Exercise Oxygen Prescription Continuous  Liters per minute 2  Increased to 3 l/m as needed   Home at Rest Exercise Oxygen Prescription Continuous   Liters per minute 2     Goals/Expected Outcomes   Comments [name] [name] is very independent with her oxygen. She uses 2 l/m, but she will increase her flow to 3l/m with activity.   Goals/Expected Outcomes Continue to self-manage her oxygen with her COPD.      Initial Exercise Prescription:   Perform Capillary Blood Glucose checks as needed.  Exercise Prescription Changes:     Exercise Prescription Changes    Row Name 02/07/16 1200 02/22/16 0800 03/07/16 0900 03/31/16 1100 04/03/16 1100     Response to Exercise   Blood Pressure (Admit) 108/60 116/78 124/60  - 110/68   Blood Pressure (Exercise) 128/62 126/60 108/68  - 122/74   Blood Pressure (Exit) 118/70 122/70 120/70  - 104/62   Heart Rate (Admit) 102 bpm 122 bpm 97 bpm  - 123 bpm   Heart Rate (Exercise) 133 bpm 138 bpm 134 bpm  - 128 bpm   Heart Rate (Exit) 108 bpm 106 bpm 123 bpm  - 117 bpm   Oxygen Saturation (Admit) 95 % 97 % 90 %  - 91 %   Oxygen Saturation (Exercise) 96 % 97 % 99 %  - 97 %   Oxygen Saturation (Exit) 97 % 98 % 98 %  -  96 %   Rating of Perceived Exertion (Exercise) '19 15 20  ' decreased speed on TM  - 13   Perceived Dyspnea (Exercise) '8 4 4  ' - 3   Duration Progress to 45 minutes of aerobic exercise without signs/symptoms of physical distress Progress to 45 minutes of aerobic exercise without signs/symptoms of physical distress Progress to 45 minutes of aerobic exercise without signs/symptoms of physical distress Progress to 45 minutes of aerobic exercise without signs/symptoms of physical distress Progress to 45 minutes of aerobic exercise without signs/symptoms of physical distress   Intensity THRR unchanged THRR unchanged THRR unchanged THRR unchanged THRR unchanged     Progression   Progression Continue to progress workloads to maintain intensity without signs/symptoms of physical distress. Continue to progress workloads to maintain intensity without signs/symptoms of physical distress. Continue to progress workloads to maintain intensity without signs/symptoms of physical distress. Continue to progress workloads to maintain intensity without signs/symptoms of physical distress. Continue to progress workloads to maintain intensity without signs/symptoms of physical distress.     Resistance Training   Training Prescription Yes Yes Yes Yes Yes   Weight '3 3 3 3 3   ' Reps 10-15 10-15 10-15 10-15 10-15     Interval Training   Interval Training No No No No No     Oxygen   Oxygen Continuous Continuous Continuous Continuous Continuous   Liters '3 3 3 3 3     ' Treadmill   MPH 1.8  - 1.5 1.5  -   Grade 0.5  - 0.5 0.5  -   Minutes 15  - 15 15  -     NuStep   Level 3  - '3 3 4   ' Minutes 15  - '15 15 15   ' METs 1.7  - 2.1 2.1 2.5     REL-XR   Level 3  -  -  - 1   Minutes 15  -  -  - 15   METs 1.8  -  -  - 2.6     Home Exercise Plan   Plans to continue  exercise at  -  -  - Home (comment)  considering [employer] upon graduation of program  -   Frequency  -  -  - Add 1 additional day to program exercise  sessions.  -   Initial Home Exercises Provided  -  -  - 03/31/16  -   [place] Name 05/01/16 1000 05/14/16 1200 05/28/16 1400 06/11/16 1200       Response to Exercise   Blood Pressure (Admit) 108/60 122/74 122/84 118/68    Blood Pressure (Exercise) 138/42 130/64 140/70 126/64    Blood Pressure (Exit) 104/64 134/74 112/66 126/72    Heart Rate (Admit) 107 bpm 94 bpm 89 bpm 103 bpm    Heart Rate (Exercise) 120 bpm 111 bpm 116 bpm 120 bpm    Heart Rate (Exit) 102 bpm 94 bpm 94 bpm 100 bpm    Oxygen Saturation (Admit) 98 % 97 % 93 % 93 %    Oxygen Saturation (Exercise) 90 % 99 % 96 % 89 %    Oxygen Saturation (Exit) 97 % 98 % 98 % 96 %    Rating of Perceived Exertion (Exercise) '13 14 13 13    ' Perceived Dyspnea (Exercise) '3 2 3 3    ' Duration Progress to 45 minutes of aerobic exercise without signs/symptoms of physical distress Progress to 45 minutes of aerobic exercise without signs/symptoms of physical distress Continue with 45 min of aerobic exercise without signs/symptoms of physical distress. Continue with 45 min of aerobic exercise without signs/symptoms of physical distress.    Intensity THRR unchanged THRR unchanged  - THRR unchanged      Progression   Progression Continue to progress workloads to maintain intensity without signs/symptoms of physical distress. Continue to progress workloads to maintain intensity without signs/symptoms of physical distress. Continue to progress workloads to maintain intensity without signs/symptoms of physical distress. Continue to progress workloads to maintain intensity without signs/symptoms of physical distress.      Resistance Training   Training Prescription Yes Yes Yes Yes    Weight '3 3 3 3    ' Reps 10-15 10-15 10-15 10-15      Interval Training   Interval Training No No No No      Oxygen   Oxygen Continuous Continuous Continuous Continuous    Liters '3 3 3 3      ' Treadmill   MPH  -  - 1.3  -    Grade  -  - 0.5  -    Minutes  -  - 15  -       NuStep   Level 5 4  - 4    Minutes 15 15  - 15    METs  - 2.1  - 2.1      REL-XR   Level '1 1 1 2    ' Minutes '15 15 15 15    ' METs 3 5.7  - 2.2       Exercise Comments:     Exercise Comments    Row Name 02/22/16 0906 03/07/16 7357 04/03/16 1133 04/18/16 1213 05/01/16 1105   Exercise Comments [name] [name] is progressing well with exercise and has not had any more panic attacks. [name] [name] has missed sessions due to her granddaughter being very ill. [name] [name] has done well with exercise since returning after a break of 3 + weeks due to family health issues. [name] [name] has not attended since 04/02/16. [name] [name] tolerates exercise well when she attends.  She has some family conflicts that interfere  with regular attendance.   Row Name 05/14/16 1230 05/28/16 1412 06/11/16 1211       Exercise Comments [name] [name] has not been able to attend consistently due to other family issues. [name] [name] has tolerated exrecise well and has attended regularly during April. [name] [name] is tolerating exercise well and has been consistent in her attendance in April.        Exercise Goals and Review:     Exercise Goals    Row Name 03/31/16 1115             Exercise Goals   Increase Physical Activity -  Home exercise guidelines reviewed       Intervention -       Expected Outcomes -       Increase Strength and Stamina -  adding 1 day/wk of strength training at home       Intervention -       Expected Outcomes -          Exercise Goals Re-Evaluation :     Exercise Goals Re-Evaluation    [place] Name 03/31/16 1120             Exercise Goal Re-Evaluation   Exercise Goals Review Increase Physical Activity;Increase Strenth and Stamina       Comments Reviewed home exercise guildelines        Expected Outcomes Patient will add 1 day of strength training at home and is considering joining [employer] upon graduation.           Discharge Exercise Prescription (Final Exercise Prescription Changes):     Exercise Prescription  Changes - 06/11/16 1200      Response to Exercise   Blood Pressure (Admit) 118/68   Blood Pressure (Exercise) 126/64   Blood Pressure (Exit) 126/72   Heart Rate (Admit) 103 bpm   Heart Rate (Exercise) 120 bpm   Heart Rate (Exit) 100 bpm   Oxygen Saturation (Admit) 93 %   Oxygen Saturation (Exercise) 89 %   Oxygen Saturation (Exit) 96 %   Rating of Perceived Exertion (Exercise) 13   Perceived Dyspnea (Exercise) 3   Duration Continue with 45 min of aerobic exercise without signs/symptoms of physical distress.   Intensity THRR unchanged     Progression   Progression Continue to progress workloads to maintain intensity without signs/symptoms of physical distress.     Resistance Training   Training Prescription Yes   Weight 3   Reps 10-15     Interval Training   Interval Training No     Oxygen   Oxygen Continuous   Liters 3     NuStep   Level 4   Minutes 15   METs 2.1     REL-XR   Level 2   Minutes 15   METs 2.2      Nutrition:  Target Goals: Understanding of nutrition guidelines, daily intake of sodium <1548m, cholesterol <2069m calories 30% from fat and 7% or less from saturated fats, daily to have 5 or more servings of fruits and vegetables.  Biometrics:    Nutrition Therapy Plan and Nutrition Goals:   Nutrition Discharge: Rate Your Plate Scores:   Nutrition Goals Re-Evaluation:   Nutrition Goals Discharge (Final Nutrition Goals Re-Evaluation):   Psychosocial: Target Goals: Acknowledge presence or absence of significant depression and/or stress, maximize coping skills, provide positive support system. Participant is able to verbalize types and ability to use techniques and skills needed for reducing stress and depression.   Initial Review &  Psychosocial Screening:   Quality of Life Scores:   PHQ-9: Recent Review Flowsheet Data    Depression screen [facility] 2/9 01/22/2016   Decreased Interest 2   Down, Depressed, Hopeless 2   PHQ - 2 Score 4    Altered sleeping 3   Tired, decreased energy 3   Change in appetite 0   Feeling bad or failure about yourself  3   Trouble concentrating 1   Moving slowly or fidgety/restless 1   Suicidal thoughts 0   PHQ-9 Score 15   Difficult doing work/chores Very difficult     Interpretation of Total Score  Total Score Depression Severity:  1-4 = Minimal depression, 5-9 = Mild depression, 10-14 = Moderate depression, 15-19 = Moderately severe depression, 20-27 = Severe depression   Psychosocial Evaluation and Intervention:     Psychosocial Evaluation - 03/12/16 1501      Psychosocial Evaluation & Interventions   Comments Counselor met with [name]. B today for initial psychosocial evaluation.  She is a 56 year old who has been struggling with COPD for 7-8 years and is hoping to get on the lung transplant list.  She lives alone but has (3) adult children and other family members who live close by.  She has been able to improve her sleep recently with a diagnosis of Sleep Apnea and use of a Bi-Pap -with 8-10 hours/night of sleep.  Her appetite is good as well.  [name]. B reports a history of depression and has been on medication for this for awhile.  She has had recent stressors that have impacted her mood with her PHQ-9 score of 15 indicating moderate depression.  Counselor processed this with [name]. B reporting her 41 year old granddaughter was recently diagnosed with Stage 4 brain cancer.  Also, [name]. Sharmaine [name] best friend recently died.  She copes with exercise; leaning on other family members and attends a support group as well as sees a Social worker weekly.  [name]. B has goals to breathe better and be able to get on the lung transplant list.  Staff will follow with [name]. B throughout the course of this program.        Psychosocial Re-Evaluation:     Psychosocial Re-Evaluation    Row Name 02/15/16 1120 03/12/16 1501 03/24/16 1031 05/14/16 1040 05/21/16 1121     Psychosocial Re-Evaluation   Comments [name] [name] called and  said she is sorry that she can not exercise today in Pulmonary Rehab since she has to be out with a family emergency.  [name] [name] on her last visit to [place] stated that her granddaughter was battling brain cancer. This has been very difficult, and [name] [name] will be with her as much as she can to support her through this illness. [name] [name] is relying on her [place] for strength and is praying for a miracle Counselor follow with [name]. [name]) today stating it is her first day back in almost a month due to her need to be with family to support during her granddaughter's battle with brain cancer.  [name] [name] states she has not smoked one cigarette through all of this (almost 3 months now) and she is relying on her family and faith to get her through this time.  [name] [name]'s sister from [place] is visiting currently and this helps somewhat.  Unfortunately, [name] [name] states she is over eating and over-sleeping during this transition as well.  Counselor commended [name] [name] for using more positive coping strategies and not smoking currently.  Commended her on attending  Pulmonary Rehab and taking time for herself.  Also encouraged her to have healthy food options in the home vs. "comfort food" during this time.  Counselor offered supportive services and will continue to follow with her throughout the course of this program.  Counselor follow up with [name] [name] today stating she has been coming sporatically due to her family situation with the granddaughter's health.  She reports her daughter (the mother of her granddaughter) is not doing well "at all" and she is very concerned about her.  Her other daughter has just taken a leave of absence from work to help [place] and family.  Counselor commended [name] [name] for even being here today with all that is going on in her life.  She reports her family and faith are her constant supports to help her through this.  Counselor encouraged her to continue to take time for herself so  she can be more able to help others.  Counselor and staff will continue to follow with [facility] through this difficult time.   Counselor follow up with [facility] today reporting things are "somewhat better" with her daughter currently as the "[employer] are flying the family - including her granddaughter - to [place] to visit family this upcoming weekend.  They will be accompanied by a nurse as well to help this process.  [name] [name] states she will not be going; but is looking very forward to the break and a weekend of relaxation just for herself!  Counselor commended her on setting healthy boundaries and limits with her family in order to improve her self-care.  Staff will continue to follow with [facility]    Continue Psychosocial Services   -  -  -  - Follow up required by staff   Row Name 06/04/16 1118 07/02/16 1603           Psychosocial Re-Evaluation   Current issues with Current Stress Concerns  -      Comments [name] [name] came in today looking a little down.  Her granddaughter is back in the hosiptal as the [employer] trip was too much for her.  She said that her granddaughter continues to fight and is filled with the [place].  Her and [name] [name] have a very special bond.  [name] [name] is trying to be strong for her daughter and granddaughter.  She continues to spend time in prayer and looking upward for guideance.  Her granddaughter is ready and in a good place, her main concern is her daughter and her mental health.  She was headed back over for another visit.  She is going to continue to pray constantly.  -      Expected Outcomes [name] [name] will rely on her faith to maintain her positive outlook.  She will be able to be there for her daughter and still take care of herself. [name] [name] continues to care for her daughter and granddaughter. Unfortunately her granddaughter is progressing with her cancer .  With the help of her [place] faith, [name] [name] supports them physically and emotionally . Regular attendence is  hard for [name] Guizar. She is commended for her strength is this situation.      Interventions Encouraged to attend Pulmonary Rehabilitation for the exercise;Stress management education  -      Continue Psychosocial Services  Follow up required by staff  -         Psychosocial Discharge (Final Psychosocial Re-Evaluation):     Psychosocial Re-Evaluation - 07/02/16 1603  Psychosocial Re-Evaluation   Expected Outcomes [name] [name] continues to care for her daughter and granddaughter. Unfortunately her granddaughter is progressing with her cancer .  With the help of her [employer] faith, [name] [name] supports them physically and emotionally . Regular attendence is hard for [name] [name]. She is commended for her strength is this situation.      Education: Education Goals: Education classes will be provided on a weekly basis, covering required topics. Participant will state understanding/return demonstration of topics presented.  Learning Barriers/Preferences:   Education Topics: Initial Evaluation Education: - Verbal, written and demonstration of respiratory meds, RPE/PD scales, oximetry and breathing techniques. Instruction on use of nebulizers and MDIs: cleaning and proper use, rinsing mouth with steroid doses and importance of monitoring MDI activations.   Pulmonary Rehab from 06/04/2016 in [facility] Cardiac and Pulmonary Rehab  Date  01/22/16  Educator  LB  Instruction Review Code  2- meets goals/outcomes      General Nutrition Guidelines/Fats and Fiber: -Group instruction provided by verbal, written material, models and posters to present the general guidelines for heart healthy nutrition. Gives an explanation and review of dietary fats and fiber.   Pulmonary Rehab from 06/04/2016 in [facility] Cardiac and Pulmonary Rehab  Date  06/02/16  Educator  CR  Instruction Review Code  2- meets goals/outcomes      Controlling Sodium/Reading Food Labels: -Group verbal and written material supporting  the discussion of sodium use in heart healthy nutrition. Review and explanation with models, verbal and written materials for utilization of the food label.   Exercise Physiology & Risk Factors: - Group verbal and written instruction with models to review the exercise physiology of the cardiovascular system and associated critical values. Details cardiovascular disease risk factors and the goals associated with each risk factor.   Aerobic Exercise & Resistance Training: - Gives group verbal and written discussion on the health impact of inactivity. On the components of aerobic and resistive training programs and the benefits of this training and how to safely progress through these programs.   Flexibility, Balance, General Exercise Guidelines: - Provides group verbal and written instruction on the benefits of flexibility and balance training programs. Provides general exercise guidelines with specific guidelines to those with heart or lung disease. Demonstration and skill practice provided.   Stress Management: - Provides group verbal and written instruction about the health risks of elevated stress, cause of high stress, and healthy ways to reduce stress.   Depression: - Provides group verbal and written instruction on the correlation between heart/lung disease and depressed mood, treatment options, and the stigmas associated with seeking treatment.   Exercise & Equipment Safety: - Individual verbal instruction and demonstration of equipment use and safety with use of the equipment.   Pulmonary Rehab from 06/04/2016 in [facility] Cardiac and Pulmonary Rehab  Date  02/01/16  Educator  C. EnterkinRN  Instruction Review Code  2- meets goals/outcomes      Infection Prevention: - Provides verbal and written material to individual with discussion of infection control including proper hand washing and proper equipment cleaning during exercise session.   Falls Prevention: - Provides verbal and  written material to individual with discussion of falls prevention and safety.   Diabetes: - Individual verbal and written instruction to review signs/symptoms of diabetes, desired ranges of glucose level fasting, after meals and with exercise. Advice that pre and post exercise glucose checks will be done for 3 sessions at entry of program.   Chronic Lung Diseases: - Group  verbal and written instruction to review new updates, new respiratory medications, new advancements in procedures and treatments. Provide informative websites and "800" numbers of self-education.   Lung Procedures: - Group verbal and written instruction to describe testing methods done to diagnose lung disease. Review the outcome of test results. Describe the treatment choices: Pulmonary Function Tests, ABGs and oximetry.   Energy Conservation: - Provide group verbal and written instruction for methods to conserve energy, plan and organize activities. Instruct on pacing techniques, use of adaptive equipment and posture/positioning to relieve shortness of breath.   Pulmonary Rehab from 06/04/2016 in [facility] Cardiac and Pulmonary Rehab  Date  04/02/16  Educator  [facility]  Instruction Review Code  2- meets goals/outcomes      Triggers: - Group verbal and written instruction to review types of environmental controls: home humidity, furnaces, filters, dust mite/pet prevention, HEPA vacuums. To discuss weather changes, air quality and the benefits of nasal washing.   Pulmonary Rehab from 06/04/2016 in [facility] Cardiac and Pulmonary Rehab  Date  01/30/16  Educator  LB  Instruction Review Code  2- meets goals/outcomes      Exacerbations: - Group verbal and written instruction to provide: warning signs, infection symptoms, calling MD promptly, preventive modes, and value of vaccinations. Review: effective airway clearance, coughing and/or vibration techniques. Create an Sports administrator.   Pulmonary Rehab from 06/04/2016 in [facility] Cardiac and  Pulmonary Rehab  Date  05/21/16  Educator  LB  Instruction Review Code  2- meets goals/outcomes      Oxygen: - Individual and group verbal and written instruction on oxygen therapy. Includes supplement oxygen, available portable oxygen systems, continuous and intermittent flow rates, oxygen safety, concentrators, and Medicare reimbursement for oxygen.   Pulmonary Rehab from 06/04/2016 in [facility] Cardiac and Pulmonary Rehab  Date  01/22/16  Educator  LB  Instruction Review Code  2- meets goals/outcomes      Respiratory Medications: - Group verbal and written instruction to review medications for lung disease. Drug class, frequency, complications, importance of spacers, rinsing mouth after steroid MDI's, and proper cleaning methods for nebulizers.   Pulmonary Rehab from 06/04/2016 in [facility] Cardiac and Pulmonary Rehab  Date  01/22/16  Educator  LB  Instruction Review Code  2- meets goals/outcomes      AED/CPR: - Group verbal and written instruction with the use of models to demonstrate the basic use of the AED with the basic ABC's of resuscitation.   Breathing Retraining: - Provides individuals verbal and written instruction on purpose, frequency, and proper technique of diaphragmatic breathing and pursed-lipped breathing. Applies individual practice skills.   Pulmonary Rehab from 06/04/2016 in [facility] Cardiac and Pulmonary Rehab  Date  01/22/16  Educator  LB  Instruction Review Code  2- meets goals/outcomes      Anatomy and Physiology of the Lungs: - Group verbal and written instruction with the use of models to provide basic lung anatomy and physiology related to function, structure and complications of lung disease.   Heart Failure: - Group verbal and written instruction on the basics of heart failure: signs/symptoms, treatments, explanation of ejection fraction, enlarged heart and cardiomyopathy.   Sleep Apnea: - Individual verbal and written instruction to review Obstructive Sleep  Apnea. Review of risk factors, methods for diagnosing and types of masks and machines for OSA.   Anxiety: - Provides group, verbal and written instruction on the correlation between heart/lung disease and anxiety, treatment options, and management of anxiety.   Pulmonary Rehab from 06/04/2016 in  [place] Cardiac and Pulmonary Rehab  Date  02/13/16  Educator  [facility]  Instruction Review Code  2- Meets goals/outcomes      Relaxation: - Provides group, verbal and written instruction about the benefits of relaxation for patients with heart/lung disease. Also provides patients with examples of relaxation techniques.   Pulmonary Rehab from 06/04/2016 in [facility] Cardiac and Pulmonary Rehab  Date  06/04/16  Educator  [facility]  Instruction Review Code  2- Meets goals/outcomes      Knowledge Questionnaire Score:    Core Components/Risk Factors/Patient Goals at Admission:   Core Components/Risk Factors/Patient Goals Review:      Goals and Risk Factor Review    Row Name 02/11/16 1053 03/24/16 1557 05/20/16 0723 07/02/16 1559       Core Components/Risk Factors/Patient Goals Review   Personal Goals Review Develop more efficient breathing techniques such as purse lipped breathing and diaphragmatic breathing and practicing self-pacing with activity.;Improve shortness of breath with ADL's;Weight Management/Obesity Weight Management/Obesity;Sedentary;Increase Strength and Stamina;Improve shortness of breath with ADL's;Develop more efficient breathing techniques such as purse lipped breathing and diaphragmatic breathing and practicing self-pacing with activity.;Increase knowledge of respiratory medications and ability to use respiratory devices properly. Improve shortness of breath with ADL's;Increase knowledge of respiratory medications and ability to use respiratory devices properly.;Develop more efficient breathing techniques such as purse lipped breathing and diaphragmatic breathing and practicing self-pacing with  activity.;Weight Management/Obesity  -    Review Patient stated that she has been using PBL and has noticed improvements in SOB during ADL's. She stated she would be interesed in scheduleing an appointment with the dietician. She was given information about making this appointment.  [name] [name] has attended irregularly due to the illness of her granddaughter who has cancer. She has progressed with her exercise goals.  [name] Vasconcelos recently had another sleep study and will be getting a new CPAP machine. Wearing the mask does make a difference in her energy level. She has a good understanding of her MDI's and uses her spacer. She plans to meet with the dietitian in the future. [name] Frankowski is planning to meet with the dietitian. She is maintaining her weight in the low 140's with a goal of 120's. She has been cutting out sugar and fried foods. Since she has been on her BIPAP machine, [name] Chiong states she has felt so much better, including her shortness of breath. She has a good understanding of her inhalers and uses her spacer. She has good technique with her PLB. Even though [name] Pala is under stress with her granddaughters illness, she has not smoked and continues to use nicotine gym. [name] Izquierdo has only been able to attend 3 sessions in this 30 day period. Her granddaughter is very ill with cancer, and [name] Kuan has missed several sessions due to her own health.     Expected Outcomes Patient will continue to use PLB to aid in contorl of SOB. Patient will make an appointment to meet with the dieticain. Continue exercising and continue participating in [place] education as she is able with her granddaughter. Continue making progress with her self-management of her COPD. Continue in [place] with regular attendence.       Core Components/Risk Factors/Patient Goals at Discharge (Final Review):      Goals and Risk Factor Review - 07/02/16 1559      Core Components/Risk Factors/Patient Goals Review    Review [name] Gangi has only been able to attend 3 sessions in this 30 day period. Her granddaughter  is very ill with cancer, and [name] Mruk has missed several sessions due to her own health.    Expected Outcomes Continue in [place] with regular attendence.      ITP Comments:     ITP Comments    Row Name 02/26/16 0732 03/12/16 1459 04/18/16 1214 05/07/16 1544 06/09/16 0732   ITP Comments [name] Fanton states she may be out several sessions. Her grand daughter has stage 4 brain cancer, and [name] Ainley helps with her care, as well as spends time with her much as possible. [name] Trembath left a message with the front desk that she has been sick and expects to be back to [facility] this week.  [name] [name] has not attended since 04/02/16. [name] Barrie granddaughter is terminally ill and needs help from [name] Adelson on a regular basis which affects her ability to attend [facility].  30 day note review by Dr Emily Filbert, Medical Director of [facility] 07/07/16 [phone] 07/09/16 1352 08/04/16 0720       ITP Comments 30 day note review by Dr Emily Filbert, Medical Director of [facility] has not been able to attend regularly due to her granddaughters illness. Called 07/23/16, Last attended 06/27/16, Message was left for patient.        Comments: 30 day note review with Medical Director of [place] Dr. Emily Filbert

## 2016-08-08 ENCOUNTER — Encounter: Payer: Self-pay | Admitting: *Deleted

## 2016-08-08 ENCOUNTER — Telehealth: Payer: Self-pay | Admitting: *Deleted

## 2016-08-08 DIAGNOSIS — J449 Chronic obstructive pulmonary disease, unspecified: Secondary | ICD-10-CM

## 2016-08-08 NOTE — Telephone Encounter (Signed)
Called to check on status of return.  Left message on voicemail. 

## 2016-08-11 ENCOUNTER — Telehealth: Payer: Self-pay

## 2016-08-11 NOTE — Telephone Encounter (Signed)
[name] [name] called to inform us that she will be returning to [facility] Wednesday.

## 2016-08-21 ENCOUNTER — Telehealth: Payer: Self-pay

## 2016-08-21 NOTE — Telephone Encounter (Signed)
Left a message for [name] [name] to see if everything was ok and if she would be returning to [facility].

## 2016-08-25 ENCOUNTER — Telehealth: Payer: Self-pay | Admitting: *Deleted

## 2016-08-25 ENCOUNTER — Encounter: Payer: Self-pay | Admitting: *Deleted

## 2016-08-25 ENCOUNTER — Other Ambulatory Visit: Payer: Self-pay | Admitting: Internal Medicine

## 2016-08-25 DIAGNOSIS — J449 Chronic obstructive pulmonary disease, unspecified: Secondary | ICD-10-CM

## 2016-08-25 NOTE — Telephone Encounter (Signed)
[name] [name] called to let us know that she wants to return.  She has been in a depression with everything that is going on with her granddaughter.   She hopes to return on Wednesday.

## 2016-08-27 ENCOUNTER — Encounter: Payer: Medicare Other | Attending: Pulmonary Disease

## 2016-08-27 DIAGNOSIS — J9621 Acute and chronic respiratory failure with hypoxia: Secondary | ICD-10-CM | POA: Diagnosis present

## 2016-08-27 DIAGNOSIS — J449 Chronic obstructive pulmonary disease, unspecified: Secondary | ICD-10-CM

## 2016-08-27 NOTE — Progress Notes (Signed)
Daily Session Note  Patient Details  Name: [name] [name] MRN: [id number] Date of Birth: 04/21/1960 Referring Provider:    Encounter Date: 08/27/2016  Check In:     Session Check In - 08/27/16 1019      Check-In   Location [facility]-Cardiac & Pulmonary Rehab   Staff Present Jessica Hawkins, MA, ACSM RCEP, Exercise Physiologist;Amanda Sommer, BA, ACSM CEP, Exercise Physiologist;  RCP,RRT,BSRT   Supervising physician immediately available to respond to emergencies [facility] immediately available ER MD   Physician(s) Dr. Stafford and McShane   Medication changes reported     No   Fall or balance concerns reported    No   Warm-up and Cool-down Performed as group-led instruction   Resistance Training Performed Yes   VAD Patient? No     Pain Assessment   Currently in Pain? No/denies   Multiple Pain Sites No         History  Smoking Status  . Former Smoker  . Packs/day: 0.50  . Years: 40.00  Smokeless Tobacco  . Never Used    Comment: quit smoking 09/26/15    Goals Met:  Proper associated with RPD/PD & O2 Sat Independence with exercise equipment Exercise tolerated well Personal goals reviewed Strength training completed today  Goals Unmet:  Not Applicable  Comments: Pt able to follow exercise prescription today without complaint.  Will continue to monitor for progression.   Dr. Mark Miller is Medical Director for [facility] and [facility]. 

## 2016-09-01 ENCOUNTER — Encounter: Payer: Medicare Other | Admitting: *Deleted

## 2016-09-01 DIAGNOSIS — J449 Chronic obstructive pulmonary disease, unspecified: Secondary | ICD-10-CM

## 2016-09-01 DIAGNOSIS — J9621 Acute and chronic respiratory failure with hypoxia: Secondary | ICD-10-CM | POA: Diagnosis not present

## 2016-09-01 NOTE — Progress Notes (Signed)
Pulmonary Individual Treatment Plan  Patient Details  Name: [name] [name] MRN: [id number] Date of Birth: 14-Aug-1960 Referring Provider:    Initial Encounter Date:    Pulmonary Rehab from 01/22/2016 in [facility]  Date  01/22/16      Visit Diagnosis: COPD, severe ([place])  Patient's Home Medications on Admission:  Current Outpatient Prescriptions:  .  albuterol (PROAIR HFA) 108 (90 [name]) MCG/ACT inhaler, INHALE 2 PUFFS BY MOUTH EVERY 6 HOURS AS NEEDED FOR WHEEZING OR SHORTNESS OF BREATH., Disp: 3 Inhaler, Rfl: 3 .  ALPRAZolam (XANAX) 1 MG tablet, TAKE 1/2 TABLET BY MOUTH THREE TIMES DAILY AS NEEDED FOR ANXIETY, Disp: 30 tablet, Rfl: 5 .  aspirin 81 MG EC tablet, Take 1 tablet (81 mg total) by mouth daily., Disp: 30 tablet, Rfl: 1 .  cetirizine (ZYRTEC ALLERGY) 10 MG tablet, Take 1 tablet (10 mg total) by mouth daily., Disp: 90 tablet, Rfl: 3 .  fluticasone (FLONASE) 50 MCG/ACT nasal spray, Place 1 spray into both nostrils daily., Disp: 1 g, Rfl: 5 .  guaiFENesin (MUCINEX) 600 MG 12 hr tablet, Take 1 tablet (600 mg total) by mouth 2 (two) times daily as needed for cough or to loosen phlegm., Disp: 20 tablet, Rfl: 0 .  ipratropium-albuterol (DUONEB) 0.5-2.5 (3) MG/3ML SOLN, USE 3 ML VIA NEBULIZER EVERY 4 HOURS AS NEEDED FOR WHEEZING OR SHORTNESS OF BREATH, Disp: 360 mL, Rfl: 5 .  levocetirizine (XYZAL) 5 MG tablet, Take 5 mg by mouth every evening., Disp: , Rfl:  .  levofloxacin (LEVAQUIN) 500 MG tablet, Take 1 tablet (500 mg total) by mouth daily. (Patient not taking: Reported on 07/22/2016), Disp: 7 tablet, Rfl: 0 .  mometasone-formoterol (DULERA) 200-5 MCG/ACT AERO, Inhale 2 puffs into the lungs 2 (two) times daily., Disp: 3 Inhaler, Rfl: 3 .  predniSONE (DELTASONE) 5 MG tablet, Take 1 tablet (5 mg total) by mouth daily with breakfast., Disp: 30 tablet, Rfl: 5 .  QUEtiapine (SEROQUEL) 50 MG tablet, Take 1 tablet (50 mg total) by mouth at bedtime., Disp: 30 tablet,  Rfl: 0 .  tiotropium (SPIRIVA) 18 MCG inhalation capsule, Place 1 capsule (18 mcg total) into inhaler and inhale daily., Disp: 90 capsule, Rfl: 3  Past Medical History: Past Medical History:  Diagnosis Date  . Breast cancer ([place])   . Cancer ([place])   . COPD (chronic obstructive pulmonary disease) ([facility])     Tobacco Use: History  Smoking Status  . Former Smoker  . Packs/day: 0.50  . Years: 40.00  Smokeless Tobacco  . Never Used    Comment: quit smoking 09/26/15    Labs: Recent Review Flowsheet Data    Labs for ITP Cardiac and Pulmonary Rehab Latest Ref Rng & Units 05/06/2015 05/08/2015 10/31/2015 10/31/2015 10/31/2015   Cholestrol 0 - 200 mg/dL - - - - -   LDLCALC 0 - 100 mg/dL - - - - -   HDL 40 - 60 mg/dL - - - - -   Trlycerides 0 - 200 mg/dL - - - - -   Hemoglobin A1c 4.8 - 5.6 % - - 5.0 - -   PHART 7.350 - 7.450 7.31(L) 7.36 - - -   PCO2ART 32.0 - 48.0 mmHg 57(H) 66(H) - - -   HCO3 20.0 - 28.0 mmol/L 28.7(H) 37.3(H) - 36.1(H) 35.9(H)   O2SAT % 96.3 96.3 - 98.2 97.4       ADL UCSD:     Pulmonary Assessment Scores    Row Name  07/01/16 1547         ADL UCSD   ADL Phase Mid     SOB Score total 92     Rest 0     Walk 3     Stairs 5     Bath 4     Dress 4     Shop 5        Pulmonary Function Assessment:   Exercise Target Goals:    Exercise Program Goal: Individual exercise prescription set with THRR, safety & activity barriers. Participant demonstrates ability to understand and report RPE using BORG scale, to self-measure pulse accurately, and to acknowledge the importance of the exercise prescription.  Exercise Prescription Goal: Starting with aerobic activity 30 plus minutes a day, 3 days per week for initial exercise prescription. Provide home exercise prescription and guidelines that participant acknowledges understanding prior to discharge.  Activity Barriers & Risk Stratification:   6 Minute Walk:     6 Minute Walk    Row Name 06/11/16 1155          6 Minute Walk   Phase Mid Program     Distance 1020 feet     Distance % Change 3 %     Walk Time 6 minutes     # of Rest Breaks 0     MPH 1.93     METS 3.3     RPE 13     Perceived Dyspnea  3     VO2 Peak 11.53     Symptoms No     Resting HR 103 bpm     Resting BP 118/68     Max Ex. HR 120 bpm     Max Ex. BP 128/70       Interval HR   Baseline HR 103     1 Minute HR 112     2 Minute HR 115     3 Minute HR 118     4 Minute HR 119     5 Minute HR 120     6 Minute HR 120       Interval Oxygen   Baseline Oxygen Saturation % 93 %     Baseline Liters of Oxygen 3 L     1 Minute Oxygen Saturation % 95 %     1 Minute Liters of Oxygen 3 L     2 Minute Oxygen Saturation % 97 %     2 Minute Liters of Oxygen 3 L     3 Minute Oxygen Saturation % 95 %     3 Minute Liters of Oxygen 3 L     4 Minute Oxygen Saturation % 94 %     4 Minute Liters of Oxygen 3 L     5 Minute Oxygen Saturation % 92 %     5 Minute Liters of Oxygen 3 L     6 Minute Oxygen Saturation % 89 %     6 Minute Liters of Oxygen 3 L     2 Minute Post Liters of Oxygen 3 L       Oxygen Initial Assessment:     Oxygen Initial Assessment - 04/14/16 0815      Home Oxygen   Home Oxygen Device Portable Concentrator;Home Concentrator   Sleep Oxygen Prescription Continuous   Liters per minute --  2-5   Home Exercise Oxygen Prescription Continuous   Liters per minute --  2-5   Home at Rest Exercise Oxygen Prescription Continuous  Liters per minute --  2-5   Compliance with Home Oxygen Use Yes     Initial 6 min Walk   Oxygen Used Continuous   Liters per minute 2   Resting Oxygen Saturation  during 6 min walk 92 %   Exercise Oxygen Saturation  during 6 min walk 89 %     Program Oxygen Prescription   Program Oxygen Prescription Continuous   Liters per minute --  2-4     Intervention   Short Term Goals To learn and exhibit compliance with exercise, home and travel O2 prescription;To Learn and  understand importance of maintaining oxygen saturations>88%;To learn and demonstrate proper use of respiratory medications;To learn and understand importance of monitoring SPO2 with pulse oximeter and demonstrate accurate use of the pulse oximeter.;To learn and demonstrate proper purse lipped breathing techniques or other breathing techniques.   Long  Term Goals Exhibits compliance with exercise, home and travel O2 prescription;Maintenance of O2 saturations>88%;Compliance with respiratory medication;Demonstrates proper use of MDI's;Exhibits proper breathing techniques, such as purse lipped breathing or other method taught during program session;Verbalizes importance of monitoring SPO2 with pulse oximeter and return demonstration      Oxygen Re-Evaluation:     Oxygen Re-Evaluation    Row Name 04/02/16 1516 05/20/16 0720 07/02/16 1555 08/27/16 1117       Program Oxygen Prescription   Program Oxygen Prescription  - Continuous;Portable Concentrator Continuous;E-Tanks Continuous;E-Tanks    Liters per minute  - '2 2 2    ' Comments  -  - Increased to 3l/m with some exercise goals  -      Home Oxygen   Home Oxygen Device  - Portable Concentrator;Home Concentrator Portable Concentrator;Home Concentrator Portable Concentrator;Home Concentrator    Sleep Oxygen Prescription  - Continuous Continuous Continuous    Liters per minute  - '2 2 2    ' Home Exercise Oxygen Prescription  - Continuous Continuous Continuous    Liters per minute  - 2 2  Increased to 3 l/m as needed 2    Home at Rest Exercise Oxygen Prescription  - Continuous Continuous Continuous    Liters per minute  - '2 2 2    ' Compliance with Home Oxygen Use  - Yes  - No    Comments  -  -  - -  portable concentrator is too heavy and wants to get a smaller one      Goals/Expected Outcomes   Short Term Goals  -  -  - To learn and exhibit compliance with exercise, home and travel O2 prescription;To Learn and understand importance of maintaining  oxygen saturations>88%;To learn and demonstrate proper use of respiratory medications;To learn and understand importance of monitoring SPO2 with pulse oximeter and demonstrate accurate use of the pulse oximeter.;To learn and demonstrate proper purse lipped breathing techniques or other breathing techniques.    Long  Term Goals  -  -  - Exhibits compliance with exercise, home and travel O2 prescription;Maintenance of O2 saturations>88%;Compliance with respiratory medication;Demonstrates proper use of MDI's;Exhibits proper breathing techniques, such as purse lipped breathing or other method taught during program session;Verbalizes importance of monitoring SPO2 with pulse oximeter and return demonstration    Comments [name] [name] has been walking into [facility] with out her oxygen. We checked her O2Sat and it was 90%. I suggested she bring her oximeter and check her O2Sat's as she comes into [facility].I educated her on the importance of O2Sats in the 9()%'s for the health of her lungs and heart. [name]  [name] has a good understanding of her oxygen and uses 2-3l/m with exercise in [place]. [name] [name] is very independent with her oxygen. She uses 2 l/m, but she will increase her flow to 3l/m with activity. Pt is using her concentrator at home but not when she is going places. She states it is too heavy to carry around and is looking into getting a smaller.     Goals/Expected Outcomes  - Continue using her oxygen with activity and self-monitor her oxygen saturations with her finger oximeter. Continue to self-manage her oxygen with her COPD. Short term: To obtain a smaller concentrator. Long Term: continue to self manage her COPD       Oxygen Discharge (Final Oxygen Re-Evaluation):     Oxygen Re-Evaluation - 08/27/16 1117      Program Oxygen Prescription   Program Oxygen Prescription Continuous;E-Tanks   Liters per minute 2     Home Oxygen   Home Oxygen Device Portable Concentrator;Home Concentrator    Sleep Oxygen Prescription Continuous   Liters per minute 2   Home Exercise Oxygen Prescription Continuous   Liters per minute 2   Home at Rest Exercise Oxygen Prescription Continuous   Liters per minute 2   Compliance with Home Oxygen Use No   Comments --  portable concentrator is too heavy and wants to get a smaller one     Goals/Expected Outcomes   Short Term Goals To learn and exhibit compliance with exercise, home and travel O2 prescription;To Learn and understand importance of maintaining oxygen saturations>88%;To learn and demonstrate proper use of respiratory medications;To learn and understand importance of monitoring SPO2 with pulse oximeter and demonstrate accurate use of the pulse oximeter.;To learn and demonstrate proper purse lipped breathing techniques or other breathing techniques.   Long  Term Goals Exhibits compliance with exercise, home and travel O2 prescription;Maintenance of O2 saturations>88%;Compliance with respiratory medication;Demonstrates proper use of MDI's;Exhibits proper breathing techniques, such as purse lipped breathing or other method taught during program session;Verbalizes importance of monitoring SPO2 with pulse oximeter and return demonstration   Comments Pt is using her concentrator at home but not when she is going places. She states it is too heavy to carry around and is looking into getting a smaller.    Goals/Expected Outcomes Short term: To obtain a smaller concentrator. Long Term: continue to self manage her COPD      Initial Exercise Prescription:   Perform Capillary Blood Glucose checks as needed.  Exercise Prescription Changes:     Exercise Prescription Changes    Row Name 03/07/16 0900 03/31/16 1100 04/03/16 1100 05/01/16 1000 05/14/16 1200     Response to Exercise   Blood Pressure (Admit) 124/60  - 110/68 108/60 122/74   Blood Pressure (Exercise) 108/68  - 122/74 138/42 130/64   Blood Pressure (Exit) 120/70  - 104/62 104/64 134/74    Heart Rate (Admit) 97 bpm  - 123 bpm 107 bpm 94 bpm   Heart Rate (Exercise) 134 bpm  - 128 bpm 120 bpm 111 bpm   Heart Rate (Exit) 123 bpm  - 117 bpm 102 bpm 94 bpm   Oxygen Saturation (Admit) 90 %  - 91 % 98 % 97 %   Oxygen Saturation (Exercise) 99 %  - 97 % 90 % 99 %   Oxygen Saturation (Exit) 98 %  - 96 % 97 % 98 %   Rating of Perceived Exertion (Exercise) 20  decreased speed on TM  - '13 13 14   ' Perceived  Dyspnea (Exercise) 4  - '3 3 2   ' Duration Progress to 45 minutes of aerobic exercise without signs/symptoms of physical distress Progress to 45 minutes of aerobic exercise without signs/symptoms of physical distress Progress to 45 minutes of aerobic exercise without signs/symptoms of physical distress Progress to 45 minutes of aerobic exercise without signs/symptoms of physical distress Progress to 45 minutes of aerobic exercise without signs/symptoms of physical distress   Intensity THRR unchanged THRR unchanged THRR unchanged THRR unchanged THRR unchanged     Progression   Progression Continue to progress workloads to maintain intensity without signs/symptoms of physical distress. Continue to progress workloads to maintain intensity without signs/symptoms of physical distress. Continue to progress workloads to maintain intensity without signs/symptoms of physical distress. Continue to progress workloads to maintain intensity without signs/symptoms of physical distress. Continue to progress workloads to maintain intensity without signs/symptoms of physical distress.     Resistance Training   Training Prescription Yes Yes Yes Yes Yes   Weight '3 3 3 3 3   ' Reps 10-15 10-15 10-15 10-15 10-15     Interval Training   Interval Training No No No No No     Oxygen   Oxygen Continuous Continuous Continuous Continuous Continuous   Liters '3 3 3 3 3     ' Treadmill   MPH 1.5 1.5  -  -  -   Grade 0.5 0.5  -  -  -   Minutes 15 15  -  -  -     NuStep   Level '3 3 4 5 4   ' Minutes '15 15 15 15 15    ' METs 2.1 2.1 2.5  - 2.1     REL-XR   Level  -  - '1 1 1   ' Minutes  -  - '15 15 15   ' METs  -  - 2.6 3 5.7     Home Exercise Plan   Plans to continue exercise at  - Home (comment)  considering [employer] upon graduation of program  -  -  -   Frequency  - Add 1 additional day to program exercise sessions.  -  -  -   Initial Home Exercises Provided  - 03/31/16  -  -  -   Row Name 05/28/16 1400 06/11/16 1200           Response to Exercise   Blood Pressure (Admit) 122/84 118/68      Blood Pressure (Exercise) 140/70 126/64      Blood Pressure (Exit) 112/66 126/72      Heart Rate (Admit) 89 bpm 103 bpm      Heart Rate (Exercise) 116 bpm 120 bpm      Heart Rate (Exit) 94 bpm 100 bpm      Oxygen Saturation (Admit) 93 % 93 %      Oxygen Saturation (Exercise) 96 % 89 %      Oxygen Saturation (Exit) 98 % 96 %      Rating of Perceived Exertion (Exercise) 13 13      Perceived Dyspnea (Exercise) 3 3      Duration Continue with 45 min of aerobic exercise without signs/symptoms of physical distress. Continue with 45 min of aerobic exercise without signs/symptoms of physical distress.      Intensity  - THRR unchanged        Progression   Progression Continue to progress workloads to maintain intensity without signs/symptoms of physical distress. Continue to progress workloads to maintain  intensity without signs/symptoms of physical distress.        Resistance Training   Training Prescription Yes Yes      Weight 3 3      Reps 10-15 10-15        Interval Training   Interval Training No No        Oxygen   Oxygen Continuous Continuous      Liters 3 3        Treadmill   MPH 1.3  -      Grade 0.5  -      Minutes 15  -        NuStep   Level  - 4      Minutes  - 15      METs  - 2.1        REL-XR   Level 1 2      Minutes 15 15      METs  - 2.2         Exercise Comments:     Exercise Comments    Row Name 03/07/16 1552 04/03/16 1133 04/18/16 1213 05/01/16 1105 05/14/16 1230    Exercise Comments [name] [name] has missed sessions due to her granddaughter being very ill. [name] [name] has done well with exercise since returning after a break of 3 + weeks due to family health issues. [name] [name] has not attended since 04/02/16. [name] [name] tolerates exercise well when she attends.  She has some family conflicts that interfere with regular attendance. [name] [name] has not been able to attend consistently due to other family issues.   [name] [name] Name 05/28/16 1412 06/11/16 1211         Exercise Comments [name] [name] has tolerated exrecise well and has attended regularly during April. [name] [name] is tolerating exercise well and has been consistent in her attendance in April.         Exercise Goals and Review:     Exercise Goals    Row Name 03/31/16 1115             Exercise Goals   Increase Physical Activity -  Home exercise guidelines reviewed       Intervention -       Expected Outcomes -       Increase Strength and Stamina -  adding 1 day/wk of strength training at home       Intervention -       Expected Outcomes -          Exercise Goals Re-Evaluation :     Exercise Goals Re-Evaluation    [name] [name] Name 03/31/16 1120 08/27/16 1129           Exercise Goal Re-Evaluation   Exercise Goals Review Increase Physical Activity;Increase Strenth and Stamina Increase Physical Activity;Increase Strenth and Stamina      Comments Reviewed home exercise guildelines  patient is exercising at home more. She is doing weights and chair exercises that she has done in class. She has not had any walking at home due to the weather.      Expected Outcomes Patient will add 1 day of strength training at home and is considering joining [employer] upon graduation.  Short: To improve attendance to imrpove physical activity. Long: Continue to exercise at home and in class.         Discharge Exercise Prescription (Final Exercise Prescription Changes):     Exercise Prescription Changes - 06/11/16 1200      Response to  Exercise   Blood Pressure (  Admit) 118/68   Blood Pressure (Exercise) 126/64   Blood Pressure (Exit) 126/72   Heart Rate (Admit) 103 bpm   Heart Rate (Exercise) 120 bpm   Heart Rate (Exit) 100 bpm   Oxygen Saturation (Admit) 93 %   Oxygen Saturation (Exercise) 89 %   Oxygen Saturation (Exit) 96 %   Rating of Perceived Exertion (Exercise) 13   Perceived Dyspnea (Exercise) 3   Duration Continue with 45 min of aerobic exercise without signs/symptoms of physical distress.   Intensity THRR unchanged     Progression   Progression Continue to progress workloads to maintain intensity without signs/symptoms of physical distress.     Resistance Training   Training Prescription Yes   Weight 3   Reps 10-15     Interval Training   Interval Training No     Oxygen   Oxygen Continuous   Liters 3     NuStep   Level 4   Minutes 15   METs 2.1     REL-XR   Level 2   Minutes 15   METs 2.2      Nutrition:  Target Goals: Understanding of nutrition guidelines, daily intake of sodium <1552m, cholesterol <2045m calories 30% from fat and 7% or less from saturated fats, daily to have 5 or more servings of fruits and vegetables.  Biometrics:    Nutrition Therapy Plan and Nutrition Goals:   Nutrition Discharge: Rate Your Plate Scores:   Nutrition Goals Re-Evaluation:     Nutrition Goals Re-Evaluation    Row Name 08/27/16 1130             Goals   Nutrition Goal Continue to maintain her weight loss with improved eating habits       Comment she has lost weight, eating better and not eating as much       Expected Outcome Short: Mainatin weight loss. Long: Continue health eating          Nutrition Goals Discharge (Final Nutrition Goals Re-Evaluation):     Nutrition Goals Re-Evaluation - 08/27/16 1130      Goals   Nutrition Goal Continue to maintain her weight loss with improved eating habits   Comment she has lost weight, eating better and not eating as much   Expected  Outcome Short: Mainatin weight loss. Long: Continue health eating      Psychosocial: Target Goals: Acknowledge presence or absence of significant depression and/or stress, maximize coping skills, provide positive support system. Participant is able to verbalize types and ability to use techniques and skills needed for reducing stress and depression.   Initial Review & Psychosocial Screening:   Quality of Life Scores:   PHQ-9: Recent Review Flowsheet Data    [facility]   Decreased Interest 2   Down, Depressed, Hopeless 2   PHQ - 2 Score 4   Altered sleeping 3   Tired, decreased energy 3   Change in appetite 0   Feeling bad or failure about yourself  3   Trouble concentrating 1   Moving slowly or fidgety/restless 1   Suicidal thoughts 0   PHQ-9 Score 15   Difficult doing work/chores Very difficult     Interpretation of Total Score  Total Score Depression Severity:  1-4 = Minimal depression, 5-9 = Mild depression, 10-14 = Moderate depression, 15-19 = Moderately severe depression, 20-27 = Severe depression   Psychosocial Evaluation and Intervention:     Psychosocial Evaluation - 03/12/16 1501  Psychosocial Evaluation & Interventions   Comments Counselor met with [name]. B today for initial psychosocial evaluation.  She is a 56 year old who has been struggling with COPD for 7-8 years and is hoping to get on the lung transplant list.  She lives alone but has (3) adult children and other family members who live close by.  She has been able to improve her sleep recently with a diagnosis of Sleep Apnea and use of a Bi-Pap -with 8-10 hours/night of sleep.  Her appetite is good as well.  [name]. B reports a history of depression and has been on medication for this for awhile.  She has had recent stressors that have impacted her mood with her PHQ-9 score of 15 indicating moderate depression.  Counselor processed this with [name]. B reporting her 83 year old granddaughter  was recently diagnosed with Stage 4 brain cancer.  Also, [name]. Sharmaine [name] best friend recently died.  She copes with exercise; leaning on other family members and attends a support group as well as sees a Social worker weekly.  [name]. B has goals to breathe better and be able to get on the lung transplant list.  Staff will follow with [name]. B throughout the course of this program.        Psychosocial Re-Evaluation:     Psychosocial Re-Evaluation    Row Name 03/12/16 1501 03/24/16 1031 05/14/16 1040 05/21/16 1121 06/04/16 1118     Psychosocial Re-Evaluation   Current issues with  -  -  -  - Current Stress Concerns   Comments [name] [name] on her last visit to [place] stated that her granddaughter was battling brain cancer. This has been very difficult, and [name] [name] will be with her as much as she can to support her through this illness. [name] [name] is relying on her [place] for strength and is praying for a miracle Counselor follow with [name]. Marlon [name] [facility]) today stating it is her first day back in almost a month due to her need to be with family to support during her granddaughter's battle with brain cancer.  [name] [name] states she has not smoked one cigarette through all of this (almost 3 months now) and she is relying on her family and faith to get her through this time.  [name] [name]'s sister from [place] is visiting currently and this helps somewhat.  Unfortunately, [name] [name] states she is over eating and over-sleeping during this transition as well.  Counselor commended [employer] for using more positive coping strategies and not smoking currently.  Commended her on attending Pulmonary Rehab and taking time for herself.  Also encouraged her to have healthy food options in the home vs. "comfort food" during this time.  Counselor offered supportive services and will continue to follow with her throughout the course of this program.  Counselor follow up with [name] [name] today stating she has been coming sporatically due to her  family situation with the granddaughter's health.  She reports her daughter (the mother of her granddaughter) is not doing well "at all" and she is very concerned about her.  Her other daughter has just taken a leave of absence from work to help [place] and family.  Counselor commended [employer] for even being here today with all that is going on in her life.  She reports her family and faith are her constant supports to help her through this.  Counselor encouraged her to continue to take time for herself so she can be more able to help others.  Counselor and  staff will continue to follow with [facility] through this difficult time.   Counselor follow up with [facility] today reporting things are "somewhat better" with her daughter currently as the "[employer] are flying the family - including her granddaughter - to [place] to visit family this upcoming weekend.  They will be accompanied by a nurse as well to help this process.  [name] [name] states she will not be going; but is looking very forward to the break and a weekend of relaxation just for herself!  Counselor commended her on setting healthy boundaries and limits with her family in order to improve her self-care.  Staff will continue to follow with [facility].  [name] [name] came in today looking a little down.  Her granddaughter is back in the hosiptal as the [employer] trip was too much for her.  She said that her granddaughter continues to fight and is filled with the Enigma.  Her and [name] [name] have a very special bond.  [name] [name] is trying to be strong for her daughter and granddaughter.  She continues to spend time in prayer and looking upward for guideance.  Her granddaughter is ready and in a good place, her main concern is her daughter and her mental health.  She was headed back over for another visit.  She is going to continue to pray constantly.   Expected Outcomes  -  -  -  - [name] [name] will rely on her faith to maintain her positive outlook.  She will be able to be there  for her daughter and still take care of herself.   Interventions  -  -  -  - Encouraged to attend Pulmonary Rehabilitation for the exercise;Stress management education   Continue Psychosocial Services   -  -  - Follow up required by staff Follow up required by staff   Row Name 07/02/16 1603 08/27/16 1132 08/27/16 1148         Psychosocial Re-Evaluation   Current issues with  - Current Depression;Current Stress Concerns  -     Comments  - [name] [name] is back today and has been in a deep depression. She has sought treatment, started medication and counseling, which both seem to be helping. Her biggest stressor right now is the pressure of being the matriarch of the family and showing she has everything together when she does not. She has been coping by talking with God and praying constantly. [name] [name] does not have a strong support system because all of her family lives elsewhere. She has come to peace with everything that has happened to her granddaughter but it still stresses her out. Counselor follow up with [facility] today who has been out for awhile.  She admits to having "shut down" and in turn shut people out of her life because of all the stress and anxiety she has been under for so long with the severe health issues of her granddaughter.  [name] [name] reports she has finally seen a Dr. and gotten on medication.  She has some information to contact a new counseling program here locally and she plans to do so.  [name] [name] realizes she can no longer carry all of this stress on her own and needs help.  She would only allow the Dr. to put her on the lowest dose of SSRI for now.  Counselor discussed possibly increasing this with the Dr.'s help in the next few weeks since the stress is undergoing is prolonged and she may need additional support with Rx and counseling.  She agreed to consider this.  [name] [name] appeared more resolved and talkative today vs. shut down and just "surviving" as in the past.  Counselor provided  supportive services and commended [facility] for getting help fur herself during this difficult time.  She plans to exercise consistently and "finish this program".  counselor will follow with her.     Expected Outcomes [name] [name] continues to care for her daughter and granddaughter. Unfortunately her granddaughter is progressing with her cancer .  With the help of her Dar[employer] Me[employer] faith, [name] [name] supports them physically and emotionally . Regular attendence is hard for [name] [name]. She is commended for her strength is this situation. Short: better attendance to rehab as an oulet for a coping mechanism. Long: Continue to cope with all of her stressors in a positive manner. Continue exercise for stress and positive self-care.  Continue medication for depressive symptoms per Drs orders and consider increasing if needed.  Complete this program.       Interventions  - Stress management education;Encouraged to attend Pulmonary Rehabilitation for the exercise;Relaxation education  -     Continue Psychosocial Services   - Follow up required by staff  -       Initial Review   Source of Stress Concerns  - Family;Chronic Illness  -        Psychosocial Discharge (Final Psychosocial Re-Evaluation):     Psychosocial Re-Evaluation - 08/27/16 1148      Psychosocial Re-Evaluation   Comments Counselor follow up with [facility] today who has been out for awhile.  She admits to having "shut down" and in turn shut people out of her life because of all the stress and anxiety she has been under for so long with the severe health issues of her granddaughter.  [name] [name] reports she has finally seen a Dr. and gotten on medication.  She has some information to contact a new counseling program here locally and she plans to do so.  [name] [name] realizes she can no longer carry all of this stress on her own and needs help.  She would only allow the Dr. to put her on the lowest dose of SSRI for now.  Counselor discussed possibly increasing this  with the Dr.'s help in the next few weeks since the stress is undergoing is prolonged and she may need additional support with Rx and counseling.  She agreed to consider this.  [name] [name] appeared more resolved and talkative today vs. shut down and just "surviving" as in the past.  Counselor provided supportive services and commended [facility] for getting help fur herself during this difficult time.  She plans to exercise consistently and "finish this program".  counselor will follow with her.   Expected Outcomes Continue exercise for stress and positive self-care.  Continue medication for depressive symptoms per Drs orders and consider increasing if needed.  Complete this program.        Education: Education Goals: Education classes will be provided on a weekly basis, covering required topics. Participant will state understanding/return demonstration of topics presented.  Learning Barriers/Preferences:   Education Topics: Initial Evaluation Education: - Verbal, written and demonstration of respiratory meds, RPE/PD scales, oximetry and breathing techniques. Instruction on use of nebulizers and MDIs: cleaning and proper use, rinsing mouth with steroid doses and importance of monitoring MDI activations.   Pulmonary Rehab from 08/27/2016 in [facility] Cardiac and Pulmonary Rehab  Date  01/22/16  Educator  LB  Instruction Review Code  2- meets goals/outcomes  General Nutrition Guidelines/Fats and Fiber: -Group instruction provided by verbal, written material, models and posters to present the general guidelines for heart healthy nutrition. Gives an explanation and review of dietary fats and fiber.   Pulmonary Rehab from 08/27/2016 in [facility] Cardiac and Pulmonary Rehab  Date  06/02/16  Educator  CR  Instruction Review Code  2- meets goals/outcomes      Controlling Sodium/Reading Food Labels: -Group verbal and written material supporting the discussion of sodium use in heart healthy nutrition. Review and  explanation with models, verbal and written materials for utilization of the food label.   Exercise Physiology & Risk Factors: - Group verbal and written instruction with models to review the exercise physiology of the cardiovascular system and associated critical values. Details cardiovascular disease risk factors and the goals associated with each risk factor.   Aerobic Exercise & Resistance Training: - Gives group verbal and written discussion on the health impact of inactivity. On the components of aerobic and resistive training programs and the benefits of this training and how to safely progress through these programs.   Flexibility, Balance, General Exercise Guidelines: - Provides group verbal and written instruction on the benefits of flexibility and balance training programs. Provides general exercise guidelines with specific guidelines to those with heart or lung disease. Demonstration and skill practice provided.   Stress Management: - Provides group verbal and written instruction about the health risks of elevated stress, cause of high stress, and healthy ways to reduce stress.   Depression: - Provides group verbal and written instruction on the correlation between heart/lung disease and depressed mood, treatment options, and the stigmas associated with seeking treatment.   Exercise & Equipment Safety: - Individual verbal instruction and demonstration of equipment use and safety with use of the equipment.   Pulmonary Rehab from 08/27/2016 in [facility] Cardiac and Pulmonary Rehab  Date  02/01/16  Educator  C. EnterkinRN  Instruction Review Code  2- meets goals/outcomes      Infection Prevention: - Provides verbal and written material to individual with discussion of infection control including proper hand washing and proper equipment cleaning during exercise session.   Falls Prevention: - Provides verbal and written material to individual with discussion of falls prevention  and safety.   Diabetes: - Individual verbal and written instruction to review signs/symptoms of diabetes, desired ranges of glucose level fasting, after meals and with exercise. Advice that pre and post exercise glucose checks will be done for 3 sessions at entry of program.   Chronic Lung Diseases: - Group verbal and written instruction to review new updates, new respiratory medications, new advancements in procedures and treatments. Provide informative websites and "800" numbers of self-education.   Lung Procedures: - Group verbal and written instruction to describe testing methods done to diagnose lung disease. Review the outcome of test results. Describe the treatment choices: Pulmonary Function Tests, ABGs and oximetry.   Energy Conservation: - Provide group verbal and written instruction for methods to conserve energy, plan and organize activities. Instruct on pacing techniques, use of adaptive equipment and posture/positioning to relieve shortness of breath.   Pulmonary Rehab from 08/27/2016 in [facility] Cardiac and Pulmonary Rehab  Date  04/02/16  Educator  [facility]  Instruction Review Code  2- meets goals/outcomes      Triggers: - Group verbal and written instruction to review types of environmental controls: home humidity, furnaces, filters, dust mite/pet prevention, HEPA vacuums. To discuss weather changes, air quality and the benefits of nasal washing.  Pulmonary Rehab from 08/27/2016 in [facility] Cardiac and Pulmonary Rehab  Date  01/30/16  Educator  LB  Instruction Review Code  2- meets goals/outcomes      Exacerbations: - Group verbal and written instruction to provide: warning signs, infection symptoms, calling MD promptly, preventive modes, and value of vaccinations. Review: effective airway clearance, coughing and/or vibration techniques. Create an Sports administrator.   Pulmonary Rehab from 08/27/2016 in [facility] Cardiac and Pulmonary Rehab  Date  05/21/16  Educator  LB  Instruction Review  Code  2- meets goals/outcomes      Oxygen: - Individual and group verbal and written instruction on oxygen therapy. Includes supplement oxygen, available portable oxygen systems, continuous and intermittent flow rates, oxygen safety, concentrators, and Medicare reimbursement for oxygen.   Pulmonary Rehab from 08/27/2016 in [facility] Cardiac and Pulmonary Rehab  Date  01/22/16  Educator  LB  Instruction Review Code  2- meets goals/outcomes      Respiratory Medications: - Group verbal and written instruction to review medications for lung disease. Drug class, frequency, complications, importance of spacers, rinsing mouth after steroid MDI's, and proper cleaning methods for nebulizers.   Pulmonary Rehab from 08/27/2016 in [facility] Cardiac and Pulmonary Rehab  Date  01/22/16  Educator  LB  Instruction Review Code  2- meets goals/outcomes      AED/CPR: - Group verbal and written instruction with the use of models to demonstrate the basic use of the AED with the basic ABC's of resuscitation.   Breathing Retraining: - Provides individuals verbal and written instruction on purpose, frequency, and proper technique of diaphragmatic breathing and pursed-lipped breathing. Applies individual practice skills.   Pulmonary Rehab from 08/27/2016 in [facility] Cardiac and Pulmonary Rehab  Date  01/22/16  Educator  LB  Instruction Review Code  2- meets goals/outcomes      Anatomy and Physiology of the Lungs: - Group verbal and written instruction with the use of models to provide basic lung anatomy and physiology related to function, structure and complications of lung disease.   Heart Failure: - Group verbal and written instruction on the basics of heart failure: signs/symptoms, treatments, explanation of ejection fraction, enlarged heart and cardiomyopathy.   Sleep Apnea: - Individual verbal and written instruction to review Obstructive Sleep Apnea. Review of risk factors, methods for diagnosing and types  of masks and machines for OSA.   Anxiety: - Provides group, verbal and written instruction on the correlation between heart/lung disease and anxiety, treatment options, and management of anxiety.   Pulmonary Rehab from 08/27/2016 in [facility] Cardiac and Pulmonary Rehab  Date  02/13/16  Educator  [facility]  Instruction Review Code  2- Meets goals/outcomes      Relaxation: - Provides group, verbal and written instruction about the benefits of relaxation for patients with heart/lung disease. Also provides patients with examples of relaxation techniques.   Pulmonary Rehab from 08/27/2016 in [facility] Cardiac and Pulmonary Rehab  Date  08/27/16  Educator  [facility]  Instruction Review Code  2- Meets goals/outcomes      Knowledge Questionnaire Score:    Core Components/Risk Factors/Patient Goals at Admission:   Core Components/Risk Factors/Patient Goals Review:      Goals and Risk Factor Review    Row Name 03/24/16 1557 05/20/16 0723 07/02/16 1559 08/27/16 1123       Core Components/Risk Factors/Patient Goals Review   Personal Goals Review Weight Management/Obesity;Sedentary;Increase Strength and Stamina;Improve shortness of breath with ADL's;Develop more efficient breathing techniques such as purse lipped breathing and diaphragmatic  breathing and practicing self-pacing with activity.;Increase knowledge of respiratory medications and ability to use respiratory devices properly. Improve shortness of breath with ADL's;Increase knowledge of respiratory medications and ability to use respiratory devices properly.;Develop more efficient breathing techniques such as purse lipped breathing and diaphragmatic breathing and practicing self-pacing with activity.;Weight Management/Obesity  - Weight Management/Obesity;Tobacco Cessation;Improve shortness of breath with ADL's    Review [name] [name] has attended irregularly due to the illness of her granddaughter who has cancer. She has progressed with her exercise goals.  [name]  [name] recently had another sleep study and will be getting a new CPAP machine. Wearing the mask does make a difference in her energy level. She has a good understanding of her MDI's and uses her spacer. She plans to meet with the dietitian in the future. [name] [name] is planning to meet with the dietitian. She is maintaining her weight in the low 140's with a goal of 120's. She has been cutting out sugar and fried foods. Since she has been on her BIPAP machine, [name] [name] states she has felt so much better, including her shortness of breath. She has a good understanding of her inhalers and uses her spacer. She has good technique with her PLB. Even though [name] [name] is under stress with her granddaughters illness, she has not smoked and continues to use nicotine gym. [name] [name] has only been able to attend 3 sessions in this 30 day period. Her granddaughter is very ill with cancer, and [name] Skyles has missed several sessions due to her own health.  [name] [name] has lost weight since she was out of the program. Her weight loss could have been from her dreppresive state. She is eating better and exercising more currently. [name] [name] is not taking as much steroid medication which help curb her appitite. [name] [name] states she has gone 4 to 5 months without smoking. Shotness of breath has got better and is able to do more at home. The weather really affects her breathing and she does not go outside when it is hot and humid.    Expected Outcomes Continue exercising and continue participating in [place] education as she is able with her granddaughter. Continue making progress with her self-management of her COPD. Continue in [place] with regular attendence. Short: improve her attendance in rehab. Long Term: Go two more months without smoking.       Core Components/Risk Factors/Patient Goals at Discharge (Final Review):      Goals and Risk Factor Review - 08/27/16 1123      Core Components/Risk Factors/Patient Goals  Review   Personal Goals Review Weight Management/Obesity;Tobacco Cessation;Improve shortness of breath with ADL's   Review [name] [name] has lost weight since she was out of the program. Her weight loss could have been from her dreppresive state. She is eating better and exercising more currently. [name] [name] is not taking as much steroid medication which help curb her appitite. [name] [name] states she has gone 4 to 5 months without smoking. Shotness of breath has got better and is able to do more at home. The weather really affects her breathing and she does not go outside when it is hot and humid.   Expected Outcomes Short: improve her attendance in rehab. Long Term: Go two more months without smoking.      ITP Comments:     ITP Comments    Row Name 03/12/16 1459 04/18/16 1214 05/07/16 1544 06/09/16 0732 07/07/16 0747   ITP Comments [name] Stjulien left a message with the front desk that  she has been sick and expects to be back to [facility] this week.  [name] [name] has not attended since 04/02/16. [name] Oblinger granddaughter is terminally ill and needs help from [name] Machamer on a regular basis which affects her ability to attend [facility].  30 day note review by Dr Emily Filbert, Medical Director of [place] 30 day note review by Dr Emily Filbert, Medical Director of [place] Name 07/09/16 1352 08/04/16 0720 08/04/16 0727 08/08/16 1024 08/21/16 1031   ITP Comments [name] [name] has not been able to attend regularly due to her granddaughters illness. Called 07/23/16, Last attended 06/27/16, Message was left for patient. 30 day note review with Medical Director of [facility] Dr. Emily Filbert Called to check on status of return.  Left message on voicemail Left a message for [name]. Mcfaul to see if everything was ok and if she would be returning to [place].   [place] Name 08/25/16 [phone] 09/01/16 0850         ITP Comments [name] [name] called to let us know that she wants to return.  She has been in a depression with everything that is going on  with her granddaughter.   She hopes to return on Wednesday. 30 day review completed ITP sent to Dr. Ramonita Lab for Dr. Emily Filbert Director of [place]. Continue with ITP unless changes are made by physician.          Comments:

## 2016-09-01 NOTE — Progress Notes (Signed)
Daily Session Note  Patient Details  Name: SYMPHONIE SCHNEIDERMAN MRN: [id number] Date of Birth: Apr 12, 1960 Referring Provider:    Encounter Date: 09/01/2016  Check In:     Session Check In - 09/01/16 1009      Check-In   Location [facility]-Cardiac & Pulmonary Rehab   Staff Present Justin Mend Jaci Carrel, BS, ACSM CEP, Exercise Physiologist;Amanda Oletta Darter, [place], ACSM CEP, Exercise Physiologist   Supervising physician immediately available to respond to emergencies [facility] immediately available ER MD   Physician(s) Dr. Shirl Harris and Dr. Quentin Cornwall   Medication changes reported     No   Fall or balance concerns reported    No   Warm-up and Cool-down Performed as group-led instruction   Resistance Training Performed Yes   VAD Patient? No     Pain Assessment   Currently in Pain? No/denies   Multiple Pain Sites No         History  Smoking Status  . Former Smoker  . Packs/day: 0.50  . Years: 40.00  Smokeless Tobacco  . Never Used    Comment: quit smoking 09/26/15    Goals Met:  Proper associated with RPD/PD & O2 Sat [place] with exercise equipment Exercise tolerated well Strength training completed today  Goals Unmet:  Not Applicable  Comments: Pt able to follow exercise prescription today without complaint.  Will continue to monitor for progression.    Dr. Emily Filbert is Medical Director for [place] and [facility].

## 2016-09-03 ENCOUNTER — Encounter: Payer: Medicare Other | Attending: Pulmonary Disease

## 2016-09-03 DIAGNOSIS — J9621 Acute and chronic respiratory failure with hypoxia: Secondary | ICD-10-CM | POA: Insufficient documentation

## 2016-09-05 ENCOUNTER — Encounter: Payer: Medicare Other | Admitting: *Deleted

## 2016-09-05 DIAGNOSIS — J449 Chronic obstructive pulmonary disease, unspecified: Secondary | ICD-10-CM

## 2016-09-05 DIAGNOSIS — J9621 Acute and chronic respiratory failure with hypoxia: Secondary | ICD-10-CM | POA: Diagnosis not present

## 2016-09-05 NOTE — Progress Notes (Signed)
Daily Session Note  Patient Details  Name: [name] [name] MRN: [id number] Date of Birth: 04-06-1960 Referring Provider:    Encounter Date: 09/05/2016  Check In:     Session Check In - 09/05/16 1011      Check-In   Location [facility]-Cardiac & Pulmonary Rehab   Staff Present Gerlene Burdock, RN, BSN;Joseph Foy [place], [place], ACSM CEP, Exercise Physiologist   Supervising physician immediately available to respond to emergencies [facility] immediately available ER MD   Physician(s) Dr. Alfred Levins and Dr. Jimmye Norman   Medication changes reported     No   Fall or balance concerns reported    No   Tobacco Cessation No Change   Warm-up and Cool-down Performed as group-led instruction   Resistance Training Performed Yes   VAD Patient? No     Pain Assessment   Currently in Pain? No/denies           Exercise Prescription Changes - 09/05/16 0900      Response to Exercise   Blood Pressure (Admit) 130/70   Blood Pressure (Exercise) 110/64   Blood Pressure (Exit) 130/70   Heart Rate (Admit) 91 bpm   Heart Rate (Exercise) 114 bpm   Heart Rate (Exit) 101 bpm   Oxygen Saturation (Admit) 96 %   Oxygen Saturation (Exercise) 96 %   Oxygen Saturation (Exit) 97 %   Rating of Perceived Exertion (Exercise) 13   Perceived Dyspnea (Exercise) 3   Duration Continue with 45 min of aerobic exercise without signs/symptoms of physical distress.   Intensity THRR unchanged     Progression   Progression Continue to progress workloads to maintain intensity without signs/symptoms of physical distress.   Average METs 2.5     Resistance Training   Training Prescription Yes   Weight 3   Reps 10-15     Oxygen   Oxygen Continuous   Liters 3     Treadmill   MPH 1.3   Grade 1   Minutes 15     NuStep   Level 4   Minutes 15   METs 2.5     REL-XR   Level 4   Minutes 15   METs 2.7      History  Smoking Status  . Former Smoker  . Packs/day: 0.50  . Years: 40.00   Smokeless Tobacco  . Never Used    Comment: quit smoking 09/26/15    Goals Met:  Proper associated with RPD/PD & O2 Sat Exercise tolerated well Strength training completed today  Goals Unmet:  Not Applicable  Comments:     Dr. Emily Filbert is Medical Director for [place] and [facility].

## 2016-09-10 ENCOUNTER — Encounter: Payer: Medicare Other | Admitting: *Deleted

## 2016-09-10 ENCOUNTER — Other Ambulatory Visit: Payer: Self-pay | Admitting: Physician Assistant

## 2016-09-10 DIAGNOSIS — J449 Chronic obstructive pulmonary disease, unspecified: Secondary | ICD-10-CM

## 2016-09-10 DIAGNOSIS — Z853 Personal history of malignant neoplasm of breast: Secondary | ICD-10-CM

## 2016-09-10 DIAGNOSIS — J9621 Acute and chronic respiratory failure with hypoxia: Secondary | ICD-10-CM | POA: Diagnosis not present

## 2016-09-10 NOTE — Progress Notes (Signed)
Daily Session Note  Patient Details  Name: LATAYNA RITCHIE MRN: [id number] Date of Birth: 01/29/1961 Referring Provider:    Encounter Date: 09/10/2016  Check In:     Session Check In - 09/10/16 1110      Check-In   Staff Present Heath Lark, RN, BSN, CCRP;Jessica Hankinson, MA, ACSM RCEP, Exercise Physiologist;Joseph Flavia Shipper   Supervising physician immediately available to respond to emergencies [facility] immediately available ER MD   Physician(s) Drs: Jimmye Norman and Archie Balboa   Medication changes reported     No   Fall or balance concerns reported    No   Warm-up and Cool-down Performed as group-led instruction   Resistance Training Performed Yes   VAD Patient? No     Pain Assessment   Currently in Pain? No/denies         History  Smoking Status  . Former Smoker  . Packs/day: 0.50  . Years: 40.00  Smokeless Tobacco  . Never Used    Comment: quit smoking 09/26/15    Goals Met:  Proper associated with RPD/PD & O2 Sat Exercise tolerated well Strength training completed today  Goals Unmet:  Not Applicable  Comments: Doing well with exercise prescription progression.    Dr. Emily Filbert is Medical Director for [place] and [facility].

## 2016-09-17 ENCOUNTER — Encounter: Payer: Medicare Other | Admitting: *Deleted

## 2016-09-17 DIAGNOSIS — J9621 Acute and chronic respiratory failure with hypoxia: Secondary | ICD-10-CM | POA: Diagnosis not present

## 2016-09-17 DIAGNOSIS — J449 Chronic obstructive pulmonary disease, unspecified: Secondary | ICD-10-CM

## 2016-09-17 NOTE — Progress Notes (Signed)
Daily Session Note  Patient Details  Name: [name] [name] MRN: [id number] Date of Birth: May 19, 1960 Referring Provider:    Encounter Date: 09/17/2016  Check In:     Session Check In - 09/17/16 1008      Check-In   Location [facility]   Staff Present Alberteen Sam, MA, ACSM RCEP, Exercise Physiologist;Amanda Oletta Darter, BA, ACSM CEP, Exercise Physiologist;Joseph Flavia Shipper   Supervising physician immediately available to respond to emergencies [facility] immediately available ER MD   Physician(s) Drs. Quentin [place] and [place]   Medication changes reported     No   Fall or balance concerns reported    No   Warm-up and Cool-down Performed as group-led Location manager Performed Yes   VAD Patient? No     Pain Assessment   Currently in Pain? No/denies   Multiple Pain Sites No         History  Smoking Status  . Former Smoker  . Packs/day: 0.50  . Years: 40.00  Smokeless Tobacco  . Never Used    Comment: quit smoking 09/26/15    Goals Met:  Proper associated with RPD/PD & O2 Sat Independence with exercise equipment Exercise tolerated well Personal goals reviewed No report of cardiac concerns or symptoms Strength training completed today  Goals Unmet:  Not Applicable  Comments: Pt able to follow exercise prescription today without complaint.  Will continue to monitor for progression.   Dr. Emily Filbert is Medical Director for [place] and [facility].

## 2016-09-19 ENCOUNTER — Encounter: Payer: Medicare Other | Admitting: *Deleted

## 2016-09-19 DIAGNOSIS — J449 Chronic obstructive pulmonary disease, unspecified: Secondary | ICD-10-CM

## 2016-09-19 DIAGNOSIS — J9621 Acute and chronic respiratory failure with hypoxia: Secondary | ICD-10-CM | POA: Diagnosis not present

## 2016-09-19 NOTE — Progress Notes (Signed)
Daily Session Note  Patient Details  Name: [name] [name] MRN: [id number] Date of Birth: 04/30/1960 Referring Provider:    Encounter Date: 09/19/2016  Check In:     Session Check In - 09/19/16 1051      Check-In   Location [facility]   Staff Present  , RN, BSN;Joseph Hood RCP,RRT,BSRT;Amanda Sommer, BA, ACSM CEP, Exercise Physiologist   Supervising physician immediately available to respond to emergencies [facility] immediately available ER MD   Physician(s) DR. Rifenbark and DR. Kinner   Medication changes reported     No   Fall or balance concerns reported    No   Tobacco Cessation No Change   Warm-up and Cool-down Performed as group-led instruction   Resistance Training Performed Yes   VAD Patient? No     Pain Assessment   Currently in Pain? No/denies         History  Smoking Status  . Former Smoker  . Packs/day: 0.50  . Years: 40.00  Smokeless Tobacco  . Never Used    Comment: quit smoking 09/26/15    Goals Met:  Proper associated with RPD/PD & O2 Sat Exercise tolerated well Strength training completed today  Goals Unmet:  Not Applicable  Comments:     Dr. Mark Miller is Medical Director for [facility] and [facility]. 

## 2016-09-22 DIAGNOSIS — J449 Chronic obstructive pulmonary disease, unspecified: Secondary | ICD-10-CM

## 2016-09-22 DIAGNOSIS — J9621 Acute and chronic respiratory failure with hypoxia: Secondary | ICD-10-CM | POA: Diagnosis not present

## 2016-09-22 NOTE — Progress Notes (Signed)
Daily Session Note  Patient Details  Name: [name] [name] MRN: [id number] Date of Birth: 07-14-1960 Referring Provider:    Encounter Date: 09/22/2016  Check In:     Session Check In - 09/22/16 1029      Check-In   Location [facility]   Staff Present Gerlene Burdock, RN, Moises Blood, BS, ACSM CEP, Exercise Physiologist;Joseph Flavia Shipper   Supervising physician immediately available to respond to emergencies [facility] immediately available ER MD   Physician(s) Dr. Mable Paris and Jimmye Norman   Medication changes reported     No   Fall or balance concerns reported    No   Warm-up and Cool-down Performed as group-led instruction   Resistance Training Performed Yes   VAD Patient? No     Pain Assessment   Currently in Pain? No/denies   Multiple Pain Sites No         History  Smoking Status  . Former Smoker  . Packs/day: 0.50  . Years: 40.00  Smokeless Tobacco  . Never Used    Comment: quit smoking 09/26/15    Goals Met:  Proper associated with RPD/PD & O2 Sat Independence with exercise equipment Exercise tolerated well No report of cardiac concerns or symptoms Strength training completed today  Goals Unmet:  Not Applicable  Comments: Pt able to follow exercise prescription today without complaint.  Will continue to monitor for progression.   Dr. Emily Filbert is Medical Director for [place] and [facility].

## 2016-09-29 ENCOUNTER — Encounter: Payer: Medicare Other | Admitting: *Deleted

## 2016-09-29 DIAGNOSIS — J449 Chronic obstructive pulmonary disease, unspecified: Secondary | ICD-10-CM

## 2016-09-29 DIAGNOSIS — J9621 Acute and chronic respiratory failure with hypoxia: Secondary | ICD-10-CM | POA: Diagnosis not present

## 2016-09-29 NOTE — Progress Notes (Signed)
Daily Session Note  Patient Details  Name: QUANTAVIA FRITH MRN: [id number] Date of Birth: Apr 13, 1960 Referring Provider:    Encounter Date: 09/29/2016  Check In:     Session Check In - 09/29/16 1011      Check-In   Location [facility]   Staff Present Nada Maclachlan, BA, ACSM CEP, Exercise Physiologist;Joseph Tedd Sias, [place], ACSM CEP, Exercise Physiologist   Supervising physician immediately available to respond to emergencies [facility] immediately available ER MD   Physician(s) Dr. Mable Paris and Dr. Jimmye Norman   Medication changes reported     No   Fall or balance concerns reported    No   Warm-up and Cool-down Performed as group-led instruction   Resistance Training Performed Yes   VAD Patient? No     Pain Assessment   Currently in Pain? No/denies   Multiple Pain Sites No         History  Smoking Status  . Former Smoker  . Packs/day: 0.50  . Years: 40.00  Smokeless Tobacco  . Never Used    Comment: quit smoking 09/26/15    Goals Met:  Proper associated with RPD/PD & O2 Sat Independence with exercise equipment Exercise tolerated well Strength training completed today  Goals Unmet:  Not Applicable  Comments: Pt able to follow exercise prescription today without complaint.  Will continue to monitor for progression.    Dr. Emily Filbert is Medical Director for [place] and [facility].

## 2016-09-29 NOTE — Progress Notes (Signed)
Pulmonary Individual Treatment Plan  Patient Details  Name: [name] [name] MRN: [id number] Date of Birth: [birth year] Referring Provider:    Initial Encounter Date:    Pulmonary Rehab from 01/22/2016 in [facility] Cardiac [facility] Pulmonary Rehab  Date  01/22/16      Visit Diagnosis: COPD, severe ([place])  Patient's Home Medications on Admission:  Current Outpatient Prescriptions:  .  albuterol (PROAIR HFA) 108 (90 Base) MCG/ACT inhaler, INHALE 2 PUFFS BY MOUTH EVERY 6 HOURS AS NEEDED FOR WHEEZING OR SHORTNESS OF BREATH., Disp: 3 Inhaler, Rfl: 3 .  ALPRAZolam (XANAX) 1 MG tablet, TAKE 1/2 TABLET BY MOUTH THREE TIMES DAILY AS NEEDED FOR ANXIETY, Disp: 30 tablet, Rfl: 5 .  aspirin 81 MG EC tablet, Take 1 tablet (81 mg total) by mouth daily., Disp: 30 tablet, Rfl: 1 .  cetirizine (ZYRTEC ALLERGY) 10 MG tablet, Take 1 tablet (10 mg total) by mouth daily., Disp: 90 tablet, Rfl: 3 .  fluticasone (FLONASE) 50 MCG/ACT nasal spray, Place 1 spray into both nostrils daily., Disp: 1 g, Rfl: 5 .  guaiFENesin (MUCINEX) 600 MG 12 hr tablet, Take 1 tablet (600 mg total) by mouth 2 (two) times daily as needed for cough or to loosen phlegm., Disp: 20 tablet, Rfl: 0 .  ipratropium-albuterol (DUONEB) 0.5-2.5 (3) MG/3ML SOLN, USE 3 ML VIA NEBULIZER EVERY 4 HOURS AS NEEDED FOR WHEEZING OR SHORTNESS OF BREATH, Disp: 360 mL, Rfl: 5 .  levocetirizine (XYZAL) 5 MG tablet, Take 5 mg by mouth every evening., Disp: , Rfl:  .  levofloxacin (LEVAQUIN) 500 MG tablet, Take 1 tablet (500 mg total) by mouth daily. (Patient not taking: Reported on 07/22/2016), Disp: 7 tablet, Rfl: 0 .  mometasone-formoterol (DULERA) 200-5 MCG/ACT AERO, Inhale 2 puffs into the lungs 2 (two) times daily., Disp: 3 Inhaler, Rfl: 3 .  predniSONE (DELTASONE) 5 MG tablet, Take 1 tablet (5 mg total) by mouth daily with breakfast., Disp: 30 tablet, Rfl: 5 .  QUEtiapine (SEROQUEL) 50 MG tablet, Take 1 tablet (50 mg total) by mouth at bedtime., Disp: 30 tablet,  Rfl: 0 .  tiotropium (SPIRIVA) 18 MCG inhalation capsule, Place 1 capsule (18 mcg total) into inhaler and inhale daily., Disp: 90 capsule, Rfl: 3  Past Medical History: Past Medical History:  Diagnosis Date  . Breast cancer ([place])   . Cancer ([place])   . COPD (chronic obstructive pulmonary disease) ([facility])     Tobacco Use: History  Smoking Status  . Former Smoker  . Packs/day: 0.50  . Years: 40.00  Smokeless Tobacco  . Never Used    Comment: quit smoking 09/26/15    Labs: Recent Review Flowsheet Data    Labs for ITP Cardiac and Pulmonary Rehab Latest Ref Rng & Units 05/06/2015 05/08/2015 10/31/2015 10/31/2015 10/31/2015   Cholestrol 0 - 200 mg/dL - - - - -   LDLCALC 0 - 100 mg/dL - - - - -   HDL 40 - 60 mg/dL - - - - -   Trlycerides 0 - 200 mg/dL - - - - -   Hemoglobin A1c 4.8 - 5.6 % - - 5.0 - -   PHART 7.350 - 7.450 7.31(L) 7.36 - - -   PCO2ART 32.0 - 48.0 mmHg 57(H) 66(H) - - -   HCO3 20.0 - 28.0 mmol/L 28.7(H) 37.3(H) - 36.1(H) 35.9(H)   O2SAT % 96.3 96.3 - 98.2 97.4       Pulmonary Assessment Scores:     Pulmonary Assessment Scores    Row  Name 07/01/16 1547         ADL UCSD   ADL Phase Mid     SOB Score total 92     Rest 0     Walk 3     Stairs 5     Bath 4     Dress 4     Shop 5        Pulmonary Function Assessment:   Exercise Target Goals:    Exercise Program Goal: Individual exercise prescription set with THRR, safety & activity barriers. Participant demonstrates ability to understand and report RPE using BORG scale, to self-measure pulse accurately, and to acknowledge the importance of the exercise prescription.  Exercise Prescription Goal: Starting with aerobic activity 30 plus minutes a day, 3 days per week for initial exercise prescription. Provide home exercise prescription and guidelines that participant acknowledges understanding prior to discharge.  Activity Barriers & Risk Stratification:   6 Minute Walk:     6 Minute Walk    Row Name  06/11/16 1155         6 Minute Walk   Phase Mid Program     Distance 1020 feet     Distance % Change 3 %     Walk Time 6 minutes     # of Rest Breaks 0     MPH 1.93     METS 3.3     RPE 13     Perceived Dyspnea  3     VO2 Peak 11.53     Symptoms No     Resting HR 103 bpm     Resting BP 118/68     Max Ex. HR 120 bpm     Max Ex. BP 128/70       Interval HR   Baseline HR (retired) 103     1 Minute HR 112     2 Minute HR 115     3 Minute HR 118     4 Minute HR 119     5 Minute HR 120     6 Minute HR 120       Interval Oxygen   Baseline Oxygen Saturation % 93 %     Resting Liters of Oxygen 3 L     1 Minute Oxygen Saturation % 95 %     1 Minute Liters of Oxygen 3 L     2 Minute Oxygen Saturation % 97 %     2 Minute Liters of Oxygen 3 L     3 Minute Oxygen Saturation % 95 %     3 Minute Liters of Oxygen 3 L     4 Minute Oxygen Saturation % 94 %     4 Minute Liters of Oxygen 3 L     5 Minute Oxygen Saturation % 92 %     5 Minute Liters of Oxygen 3 L     6 Minute Oxygen Saturation % 89 %     6 Minute Liters of Oxygen 3 L     2 Minute Post Liters of Oxygen 3 L       Oxygen Initial Assessment:     Oxygen Initial Assessment - 04/14/16 0815      Home Oxygen   Home Oxygen Device Portable Concentrator;Home Concentrator   Sleep Oxygen Prescription Continuous   Liters per minute --  2-5   Home Exercise Oxygen Prescription Continuous   Liters per minute --  2-5   Home at Rest Exercise Oxygen Prescription  Continuous   Liters per minute --  2-5   Compliance with Home Oxygen Use Yes     Initial 6 min Walk   Oxygen Used Continuous   Liters per minute 2   Resting Oxygen Saturation  92 %   Exercise Oxygen Saturation  during 6 min walk 89 %     Program Oxygen Prescription   Program Oxygen Prescription Continuous   Liters per minute --  2-4     Intervention   Short Term Goals To learn and exhibit compliance with exercise, home and travel O2 prescription;To Learn  and understand importance of maintaining oxygen saturations>88%;To learn and demonstrate proper use of respiratory medications;To learn and understand importance of monitoring SPO2 with pulse oximeter and demonstrate accurate use of the pulse oximeter.;To learn and demonstrate proper purse lipped breathing techniques or other breathing techniques.   Long  Term Goals Exhibits compliance with exercise, home and travel O2 prescription;Maintenance of O2 saturations>88%;Compliance with respiratory medication;Demonstrates proper use of MDI's;Exhibits proper breathing techniques, such as purse lipped breathing or other method taught during program session;Verbalizes importance of monitoring SPO2 with pulse oximeter and return demonstration      Oxygen Re-Evaluation:     Oxygen Re-Evaluation    Row Name 04/02/16 1516 05/20/16 0720 07/02/16 1555 08/27/16 1117 09/17/16 1122     Program Oxygen Prescription   Program Oxygen Prescription  - Continuous;Portable Concentrator Continuous;E-Tanks Continuous;E-Tanks Continuous;E-Tanks   Liters per minute  - _0 Comments  -  - Increased to 3l/m with some exercise goals  - Increased to 3l/m with some exercise goals     Home Oxygen   Home Oxygen Device  - Portable Concentrator;Home Concentrator Portable Concentrator;Home Concentrator Portable Concentrator;Home Concentrator Portable Concentrator;Home Concentrator   Sleep Oxygen Prescription  - Continuous Continuous Continuous Continuous   Liters per minute  - _1 Home Exercise Oxygen Prescription  - Continuous Continuous Continuous Continuous   Liters per minute  - 2 2  Increased to 3 l/m as needed 2 2   Home at Rest Exercise Oxygen Prescription  - Continuous Continuous Continuous Continuous   Liters per minute  - _2 Compliance with Home Oxygen Use  - Yes  - No No   Comments  -  -  - -  portable concentrator is too heavy and wants to get a smaller one  -     Goals/Expected Outcomes    Short Term Goals  -  -  - To learn and exhibit compliance with exercise, home and travel O2 prescription;To Learn and understand importance of maintaining oxygen saturations>88%;To learn and demonstrate proper use of respiratory medications;To learn and understand importance of monitoring SPO2 with pulse oximeter and demonstrate accurate use of the pulse oximeter.;To learn and demonstrate proper purse lipped breathing techniques or other breathing techniques. To learn and exhibit compliance with exercise, home and travel O2 prescription;To Learn and understand importance of maintaining oxygen saturations>88%;To learn and demonstrate proper use of respiratory medications;To learn and understand importance of monitoring SPO2 with pulse oximeter and demonstrate accurate use of the pulse oximeter.;To learn and demonstrate proper purse lipped breathing techniques or other breathing techniques.   Long  Term Goals  -  -  - Exhibits compliance with exercise, home and travel O2 prescription;Maintenance of O2 saturations>88%;Compliance with respiratory medication;Demonstrates proper use of MDI's;Exhibits proper breathing techniques, such as purse lipped breathing or other method taught during program session;Verbalizes importance of  monitoring SPO2 with pulse oximeter and return demonstration Exhibits compliance with exercise, home and travel O2 prescription;Maintenance of O2 saturations>88%;Compliance with respiratory medication;Demonstrates proper use of MDI's;Exhibits proper breathing techniques, such as purse lipped breathing or other method taught during program session;Verbalizes importance of monitoring SPO2 with pulse oximeter and return demonstration   Comments [name] [name] has been walking into [facility] with out her oxygen. We checked her O2Sat and it was 90%. I suggested she bring her oximeter and check her O2Sat's as she comes into [facility].I educated her on the importance of O2Sats in the 9()%'s for  the health of her lungs and heart. [name] [name] has a good understanding of her oxygen and uses 2-3l/m with exercise in [place]. [name] [name] is very independent with her oxygen. She uses 2 l/m, but she will increase her flow to 3l/m with activity. Pt is using her concentrator at home but not when she is going places. She states it is too heavy to carry around and is looking into getting a smaller.  [name] [name] is staying compliant with her oxygen use.  She has not had any problems with her medications and staying on top of them.  She is using her PLB all the time and finds it very helpful.     Goals/Expected Outcomes  - Continue using her oxygen with activity and self-monitor her oxygen saturations with her finger oximeter. Continue to self-manage her oxygen with her COPD. Short term: To obtain a smaller concentrator. Long Term: continue to self manage her COPD Short: continue to think about a smaller concentrator.  Long: Continue to self manage COPD.      Oxygen Discharge (Final Oxygen Re-Evaluation):     Oxygen Re-Evaluation - 09/17/16 1122      Program Oxygen Prescription   Program Oxygen Prescription Continuous;E-Tanks   Liters per minute 2   Comments Increased to 3l/m with some exercise goals     Home Oxygen   Home Oxygen Device Portable Concentrator;Home Concentrator   Sleep Oxygen Prescription Continuous   Liters per minute 2   Home Exercise Oxygen Prescription Continuous   Liters per minute 2   Home at Rest Exercise Oxygen Prescription Continuous   Liters per minute 2   Compliance with Home Oxygen Use No     Goals/Expected Outcomes   Short Term Goals To learn and exhibit compliance with exercise, home and travel O2 prescription;To Learn and understand importance of maintaining oxygen saturations>88%;To learn and demonstrate proper use of respiratory medications;To learn and understand importance of monitoring SPO2 with pulse oximeter and demonstrate accurate use of the pulse  oximeter.;To learn and demonstrate proper purse lipped breathing techniques or other breathing techniques.   Long  Term Goals Exhibits compliance with exercise, home and travel O2 prescription;Maintenance of O2 saturations>88%;Compliance with respiratory medication;Demonstrates proper use of MDI's;Exhibits proper breathing techniques, such as purse lipped breathing or other method taught during program session;Verbalizes importance of monitoring SPO2 with pulse oximeter and return demonstration   Comments [name] [name] is staying compliant with her oxygen use.  She has not had any problems with her medications and staying on top of them.  She is using her PLB all the time and finds it very helpful.     Goals/Expected Outcomes Short: continue to think about a smaller concentrator.  Long: Continue to self manage COPD.      Initial Exercise Prescription:   Perform Capillary Blood Glucose checks as needed.  Exercise Prescription Changes:     Exercise Prescription Changes  Row Name 04/03/16 1100 05/01/16 1000 05/14/16 1200 05/28/16 1400 06/11/16 1200     Response to Exercise   Blood Pressure (Admit) 110/68 108/60 122/74 122/84 118/68   Blood Pressure (Exercise) 122/74 138/42 130/64 140/70 126/64   Blood Pressure (Exit) 104/62 104/64 134/74 112/66 126/72   Heart Rate (Admit) 123 bpm 107 bpm 94 bpm 89 bpm 103 bpm   Heart Rate (Exercise) 128 bpm 120 bpm 111 bpm 116 bpm 120 bpm   Heart Rate (Exit) 117 bpm 102 bpm 94 bpm 94 bpm 100 bpm   Oxygen Saturation (Admit) 91 % 98 % 97 % 93 % 93 %   Oxygen Saturation (Exercise) 97 % 90 % 99 % 96 % 89 %   Oxygen Saturation (Exit) 96 % 97 % 98 % 98 % 96 %   Rating of Perceived Exertion (Exercise) _0 Perceived Dyspnea (Exercise) _1 Duration Progress to 45 minutes of aerobic exercise without signs/symptoms of physical distress Progress to 45 minutes of aerobic exercise without signs/symptoms of physical distress Progress to 45 minutes of  aerobic exercise without signs/symptoms of physical distress Continue with 45 min of aerobic exercise without signs/symptoms of physical distress. Continue with 45 min of aerobic exercise without signs/symptoms of physical distress.   Intensity THRR unchanged THRR unchanged THRR unchanged  - THRR unchanged     Progression   Progression Continue to progress workloads to maintain intensity without signs/symptoms of physical distress. Continue to progress workloads to maintain intensity without signs/symptoms of physical distress. Continue to progress workloads to maintain intensity without signs/symptoms of physical distress. Continue to progress workloads to maintain intensity without signs/symptoms of physical distress. Continue to progress workloads to maintain intensity without signs/symptoms of physical distress.     Resistance Training   Training Prescription _2    Weight _3 Reps 10-15 10-15 10-15 10-15 10-15     Interval Training   Interval Training _4      Oxygen   Oxygen _5    Liters _6 Treadmill   MPH  -  -  - 1.3  -   Grade  -  -  - 0.5  -   Minutes  -  -  - 15  -     NuStep   Level _7 - 4   Minutes _8 - 15   METs 2.5  - 2.1  - 2.1     REL-XR   Level _9 Minutes _10 METs 2.6 3 5.7  - 2.2   Row Name 09/05/16 0900 09/17/16 1200           Response to Exercise   Blood Pressure (Admit) 130/70 114/64      Blood Pressure (Exercise) 110/64  -      Blood Pressure (Exit) 130/70 112/60      Heart Rate (Admit) 91 bpm 101 bpm      Heart Rate (Exercise) 114 bpm 139 bpm      Heart Rate (Exit) 101 bpm 97 bpm      Oxygen Saturation (Admit) 96 % 94 %      Oxygen Saturation (Exercise) 96 % 91 %      Oxygen Saturation (Exit) 97 % 97 %  Rating of Perceived Exertion (Exercise) 13 13      Perceived Dyspnea (Exercise) 3 3      Duration Continue with  45 min of aerobic exercise without signs/symptoms of physical distress. Continue with 45 min of aerobic exercise without signs/symptoms of physical distress.      Intensity THRR unchanged THRR unchanged        Progression   Progression Continue to progress workloads to maintain intensity without signs/symptoms of physical distress. Continue to progress workloads to maintain intensity without signs/symptoms of physical distress.      Average METs 2.5 2.5        Resistance Training   Training Prescription Yes Yes      Weight 3 3      Reps 10-15 10-15        Oxygen   Oxygen Continuous Continuous      Liters 3 3        Treadmill   MPH 1.3 1.6      Grade 1 1.5      Minutes 15 15        NuStep   Level 4  -      Minutes 15  -      METs 2.5  -        REL-XR   Level 4 1      Minutes 15 15      METs 2.7 2.4         Exercise Comments:     Exercise Comments    Row Name 04/03/16 1133 04/18/16 1213 05/01/16 1105 05/14/16 1230 05/28/16 1412   Exercise Comments [name] [name] has done well with exercise since returning after a break of 3 + weeks due to family health issues. [name] [name] has not attended since 04/02/16. [name] [name] tolerates exercise well when she attends.  She has some family conflicts that interfere with regular attendance. [name] [name] has not been able to attend consistently due to other family issues. [name] [name] has tolerated exrecise well and has attended regularly during April.   [place] Name 06/11/16 1211           Exercise Comments [name] [name] is tolerating exercise well and has been consistent in her attendance in April.          Exercise Goals and Review:   Exercise Goals Re-Evaluation :     Exercise Goals Re-Evaluation    Row Name 08/27/16 1129 09/05/16 0926 09/17/16 1059 09/17/16 1214       Exercise Goal Re-Evaluation   Exercise Goals Review Increase Physical Activity;Increase Strenth and Stamina Increase Physical Activity;Increase Strenth and Stamina Increase Physical  Activity;Increase Strenth and Stamina Increase Physical Activity    Comments patient is exercising at home more. She is doing weights and chair exercises that she has done in class. She has not had any walking at home due to the weather. [name] [name] states she is able to attend more regularly at this time.  She has increased her overall MET level. [name] [name] has been doing well in rehab. She missed a couple of days from being sick.  She enjoys coming to rehab and getting out of the house.  She has not been able to exercise as much as she would like at home due to the humidity.  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 [name] [name] has been able to attend class more often.    Expected Outcomes Short: To improve attendance to imrpove physical activity. Long: Continue to exercise at home and in class. Short -  [name] [name] will attend regularly.  Long - [name] [name] will complete the [employer] program. Short: Continue to attend class regularly.  Long: Start to exercise more at home and join [employer]. Short - [name] [name] will achieve best results with regular attendance.  Long - [name] [name] will continue to exercise on her own.       Discharge Exercise Prescription (Final Exercise Prescription Changes):     Exercise Prescription Changes - 09/17/16 1200      Response to Exercise   Blood Pressure (Admit) 114/64   Blood Pressure (Exit) 112/60   Heart Rate (Admit) 101 bpm   Heart Rate (Exercise) 139 bpm   Heart Rate (Exit) 97 bpm   Oxygen Saturation (Admit) 94 %   Oxygen Saturation (Exercise) 91 %   Oxygen Saturation (Exit) 97 %   Rating of Perceived Exertion (Exercise) 13   Perceived Dyspnea (Exercise) 3   Duration Continue with 45 min of aerobic exercise without signs/symptoms of physical distress.   Intensity THRR unchanged     Progression   Progression Continue to progress workloads to maintain intensity without signs/symptoms of physical distress.   Average METs 2.5     Resistance Training   Training Prescription Yes    Weight 3   Reps 10-15     Oxygen   Oxygen Continuous   Liters 3     Treadmill   MPH 1.6   Grade 1.5   Minutes 15     REL-XR   Level 1   Minutes 15   METs 2.4      Nutrition:  Target Goals: Understanding of nutrition guidelines, daily intake of sodium <1573m, cholesterol <2068m calories 30% from fat and 7% or less from saturated fats, daily to have 5 or more servings of fruits and vegetables.  Biometrics:    Nutrition Therapy Plan and Nutrition Goals:   Nutrition Discharge: Rate Your Plate Scores:   Nutrition Goals Re-Evaluation:     Nutrition Goals Re-Evaluation    Row Name 08/27/16 1130 09/17/16 1114           Goals   Nutrition Goal Continue to maintain her weight loss with improved eating habits Meet with nutirtionist       Comment she has lost weight, eating better and not eating as much MaBonnita Hollowas an appointment to meet with dietician on 9/4.      Expected Outcome Short: Mainatin weight loss. Long: Continue health eating Short: Meet with nutrtitionist  Long: Work on weight loss eating.         Nutrition Goals Discharge (Final Nutrition Goals Re-Evaluation):     Nutrition Goals Re-Evaluation - 09/17/16 1114      Goals   Nutrition Goal Meet with nutirtionist    Comment MaBonnita Hollowas an appointment to meet with dietician on 9/4.   Expected Outcome Short: Meet with nutrtitionist  Long: Work on weight loss eating.      Psychosocial: Target Goals: Acknowledge presence or absence of significant depression and/or stress, maximize coping skills, provide positive support system. Participant is able to verbalize types and ability to use techniques and skills needed for reducing stress and depression.   Initial Review & Psychosocial Screening:   Quality of Life Scores:   PHQ-9: Recent Review Flowsheet Data    [facility] 01/[facility]   Decreased Interest 2   Down, Depressed, Hopeless 2   PHQ - 2 Score 4   Altered sleeping 3   Tired,  decreased energy 3   Change in appetite  0   Feeling bad or failure about yourself  3   Trouble concentrating 1   Moving slowly or fidgety/restless 1   Suicidal thoughts 0   PHQ-9 Score 15   Difficult doing work/chores Very difficult     Interpretation of Total Score  Total Score Depression Severity:  1-4 = Minimal depression, 5-9 = Mild depression, 10-14 = Moderate depression, 15-19 = Moderately severe depression, 20-27 = Severe depression   Psychosocial Evaluation and Intervention:   Psychosocial Re-Evaluation:     Psychosocial Re-Evaluation    [place] Name 05/14/16 1040 05/21/16 1121 06/04/16 1118 07/02/16 1603 08/27/16 1132     Psychosocial Re-Evaluation   Current issues with  -  - Current Stress Concerns  - Current Depression;Current Stress Concerns   Comments Counselor follow up with [facility] today stating she has been coming sporatically due to her family situation with the granddaughter's health.  She reports her daughter (the mother of her granddaughter) is not doing well "at all" and she is very concerned about her.  Her other daughter has just taken a leave of absence from work to help [place] and family.  Counselor commended [employer] for even being here today with all that is going on in her life.  She reports her family and faith are her constant supports to help her through this.  Counselor encouraged her to continue to take time for herself so she can be more able to help others.  Counselor and staff will continue to follow with [facility] through this difficult time.   Counselor follow up with [facility] today reporting things are "somewhat better" with her daughter currently as the "[employer] foundation are flying the family - including her granddaughter - to [place] to visit family this upcoming weekend.  They will be accompanied by a nurse as well to help this process.  [name] [name] states she will not be going; but is looking very forward to the break and a weekend of relaxation just for  herself!  Counselor commended her on setting healthy boundaries and limits with her family in order to improve her self-care.  Staff will continue to follow with [facility].  [name] [name] came in today looking a little down.  Her granddaughter is back in the hosiptal as the [employer] trip was too much for her.  She said that her granddaughter continues to fight and is filled with the [place].  Her and [name] [name] have a very special bond.  [name] [name] is trying to be strong for her daughter and granddaughter.  She continues to spend time in prayer and looking upward for guideance.  Her granddaughter is ready and in a good place, her main concern is her daughter and her mental health.  She was headed back over for another visit.  She is going to continue to pray constantly.  [name] [name] is back today and has been in a deep depression. She has sought treatment, started medication and counseling, which both seem to be helping. Her biggest stressor right now is the pressure of being the matriarch of the family and showing she has everything together when she does not. She has been coping by talking with God and praying constantly. [name] [name] does not have a strong support system because all of her family lives elsewhere. She has come to peace with everything that has happened to her granddaughter but it still stresses her out.   Expected Outcomes  -  - [name] [name] will rely on her faith to maintain her positive outlook.  She will be able to be there for her daughter and still take care of herself. [name] [name] continues to care for her daughter and granddaughter. Unfortunately her granddaughter is progressing with her cancer .  With the help of her Darrick Meigs faith, [name] [name] supports them physically and emotionally . Regular attendence is hard for [name] [name]. She is commended for her strength is this situation. Short: better attendance to rehab as an oulet for a coping mechanism. Long: Continue to cope with all of her stressors in a positive manner.    Interventions  -  - Encouraged to attend Pulmonary Rehabilitation for the exercise;Stress management education  - Stress management education;Encouraged to attend Pulmonary Rehabilitation for the exercise;Relaxation education   Continue Psychosocial Services   - Follow up required by staff Follow up required by staff  - Follow up required by staff     Initial Review   Source of Stress Concerns  -  -  -  - Family;Chronic Illness   Row Name 08/27/16 1148 09/10/16 1112 09/17/16 1116         Psychosocial Re-Evaluation   Current issues with  -  - Current Depression;Current Stress Concerns     Comments Counselor follow up with [facility] today who has been out for awhile.  She admits to having "shut down" and in turn shut people out of her life because of all the stress and anxiety she has been under for so long with the severe health issues of her granddaughter.  [name] [name] reports she has finally seen a Dr. and gotten on medication.  She has some information to contact a new counseling program here locally and she plans to do so.  [name] [name] realizes she can no longer carry all of this stress on her own and needs help.  She would only allow the Dr. to put her on the lowest dose of SSRI for now.  Counselor discussed possibly increasing this with the Dr.'s help in the next few weeks since the stress is undergoing is prolonged and she may need additional support with Rx and counseling.  She agreed to consider this.  [name] [name] appeared more resolved and talkative today vs. shut down and just "surviving" as in the past.  Counselor provided supportive services and commended [facility] for getting help fur herself during this difficult time.  She plans to exercise consistently and "finish this program".  counselor will follow with her. Counselor follow up with [facility] stating she has not yet heard back from the counseling services she has called and left messages.  Her Dr. recommended another agency yesterday, but [name] [name] is not  "excited" about that group as she had some negative experiences.  Counselor provided a resource in Biomedical [employer] who takes Medicaid and gave [name] [name] the name and number to call.  [name] [name] continues to struggle with all the stress and anxiety of her 4 year old granddaughter who has a terminal illness.  [name] [name] is aware of her need for support now and is making efforts to get help.  Counselor commended her for that and her commitment to exercise for her physical and mental health.   [name] [name] has been doing better.  She has had her faith in humanity restored.  Her granddaughter's dog was stolen and a story was run on the news and went viral.  The police that responded to the call then chipped in and helped fix up the yard and house.  They even got a her a new dog!  She still is  not able to sleep as she can not turn her brain off at night.  She has recently lost her best friend to cancer and now no longer has anyone to help pick her up.      Expected Outcomes Continue exercise for stress and positive self-care.  Continue medication for depressive symptoms per Drs orders and consider increasing if needed.  Complete this program.   [name] [name] agreed to contact the counseling resource provided by counselor on staff.  Will follow.  Short: Continue to attend class for support.  Long: Continue to work on Doctor, general practice.      Interventions  -  - Stress management education;Encouraged to attend Pulmonary Rehabilitation for the exercise     Continue Psychosocial Services   -  - Follow up required by staff       Initial Review   Source of Stress Concerns  -  - Family;Chronic Illness        Psychosocial Discharge (Final Psychosocial Re-Evaluation):     Psychosocial Re-Evaluation - 09/17/16 1116      Psychosocial Re-Evaluation   Current issues with Current Depression;Current Stress Concerns   Comments [name] [name] has been doing better.  She has had her faith in humanity restored.  Her granddaughter's dog was stolen  and a story was run on the news and went viral.  The police that responded to the call then chipped in and helped fix up the yard and house.  They even got a her a new dog!  She still is not able to sleep as she can not turn her brain off at night.  She has recently lost her best friend to cancer and now no longer has anyone to help pick her up.    Expected Outcomes Short: Continue to attend class for support.  Long: Continue to work on Doctor, general practice.    Interventions Stress management education;Encouraged to attend Pulmonary Rehabilitation for the exercise   Continue Psychosocial Services  Follow up required by staff     Initial Review   Source of Stress Concerns Family;Chronic Illness      Education: Education Goals: Education classes will be provided on a weekly basis, covering required topics. Participant will state understanding/return demonstration of topics presented.  Learning Barriers/Preferences:   Education Topics: Initial Evaluation Education: - Verbal, written and demonstration of respiratory meds, RPE/PD scales, oximetry and breathing techniques. Instruction on use of nebulizers and MDIs: cleaning and proper use, rinsing mouth with steroid doses and importance of monitoring MDI activations.   Pulmonary Rehab from 09/17/2016 in [facility] Cardiac and Pulmonary Rehab  Date  01/22/16  Educator  LB  Instruction Review Code (retired)  2- meets goals/outcomes      General Nutrition Guidelines/Fats and Fiber: -Group instruction provided by verbal, written material, models and posters to present the general guidelines for heart healthy nutrition. Gives an explanation and review of dietary fats and fiber.   Pulmonary Rehab from 09/17/2016 in [facility] Cardiac and Pulmonary Rehab  Date  06/02/16  Educator  CR  Instruction Review Code (retired)  2- meets goals/outcomes      Controlling Sodium/Reading Food Labels: -Group verbal and written material supporting the discussion of  sodium use in heart healthy nutrition. Review and explanation with models, verbal and written materials for utilization of the food label.   Exercise Physiology & Risk Factors: - Group verbal and written instruction with models to review the exercise physiology of the cardiovascular system and associated critical values. Details cardiovascular disease risk  factors and the goals associated with each risk factor.   Aerobic Exercise & Resistance Training: - Gives group verbal and written discussion on the health impact of inactivity. On the components of aerobic and resistive training programs and the benefits of this training and how to safely progress through these programs.   Flexibility, Balance, General Exercise Guidelines: - Provides group verbal and written instruction on the benefits of flexibility and balance training programs. Provides general exercise guidelines with specific guidelines to those with heart or lung disease. Demonstration and skill practice provided.   Pulmonary Rehab from 09/17/2016 in [facility] Cardiac and Pulmonary Rehab  Date  09/17/16  Educator  [facility]  Instruction Review Code (retired)  2- meets goals/outcomes      Stress Management: - Provides group verbal and written instruction about the health risks of elevated stress, cause of high stress, and healthy ways to reduce stress.   Depression: - Provides group verbal and written instruction on the correlation between heart/lung disease and depressed mood, treatment options, and the stigmas associated with seeking treatment.   Exercise & Equipment Safety: - Individual verbal instruction and demonstration of equipment use and safety with use of the equipment.   Pulmonary Rehab from 09/17/2016 in [facility] Cardiac and Pulmonary Rehab  Date  02/01/16  Educator  [facility] [place]  Instruction Review Code (retired)  2- meets goals/outcomes      Infection Prevention: - Provides verbal and written material to individual with  discussion of infection control including proper hand washing and proper equipment cleaning during exercise session.   Falls Prevention: - Provides verbal and written material to individual with discussion of falls prevention and safety.   Diabetes: - Individual verbal and written instruction to review signs/symptoms of diabetes, desired ranges of glucose level fasting, after meals and with exercise. Advice that pre and post exercise glucose checks will be done for 3 sessions at entry of program.   Chronic Lung Diseases: - Group verbal and written instruction to review new updates, new respiratory medications, new advancements in procedures and treatments. Provide informative websites and "800" numbers of self-education.   Lung Procedures: - Group verbal and written instruction to describe testing methods done to diagnose lung disease. Review the outcome of test results. Describe the treatment choices: Pulmonary Function Tests, ABGs and oximetry.   Energy Conservation: - Provide group verbal and written instruction for methods to conserve energy, plan and organize activities. Instruct on pacing techniques, use of adaptive equipment and posture/positioning to relieve shortness of breath.   Pulmonary Rehab from 09/17/2016 in [facility] Cardiac and Pulmonary Rehab  Date  04/02/16  Educator  [facility]  Instruction Review Code (retired)  2- meets goals/outcomes      Triggers: - Group verbal and written instruction to review types of environmental controls: home humidity, furnaces, filters, dust mite/pet prevention, HEPA vacuums. To discuss weather changes, air quality and the benefits of nasal washing.   Pulmonary Rehab from 09/17/2016 in [facility] Cardiac and Pulmonary Rehab  Date  01/30/16  Educator  LB  Instruction Review Code (retired)  2- meets goals/outcomes      Exacerbations: - Group verbal and written instruction to provide: warning signs, infection symptoms, calling MD promptly, preventive  modes, and value of vaccinations. Review: effective airway clearance, coughing and/or vibration techniques. Create an Sports administrator.   Pulmonary Rehab from 09/17/2016 in [facility] Cardiac and Pulmonary Rehab  Date  05/21/16  Educator  LB  Instruction Review Code (retired)  2- meets goals/outcomes  Oxygen: - Individual and group verbal and written instruction on oxygen therapy. Includes supplement oxygen, available portable oxygen systems, continuous and intermittent flow rates, oxygen safety, concentrators, and Medicare reimbursement for oxygen.   Pulmonary Rehab from 09/17/2016 in [facility] Cardiac and Pulmonary Rehab  Date  01/22/16  Educator  LB  Instruction Review Code (retired)  2- meets goals/outcomes      Respiratory Medications: - Group verbal and written instruction to review medications for lung disease. Drug class, frequency, complications, importance of spacers, rinsing mouth after steroid MDI's, and proper cleaning methods for nebulizers.   Pulmonary Rehab from 09/17/2016 in [facility] Cardiac and Pulmonary Rehab  Date  01/22/16  Educator  LB  Instruction Review Code (retired)  2- meets goals/outcomes      AED/CPR: - Group verbal and written instruction with the use of models to demonstrate the basic use of the AED with the basic ABC's of resuscitation.   Breathing Retraining: - Provides individuals verbal and written instruction on purpose, frequency, and proper technique of diaphragmatic breathing and pursed-lipped breathing. Applies individual practice skills.   Pulmonary Rehab from 09/17/2016 in [facility] Cardiac and Pulmonary Rehab  Date  01/22/16  Educator  LB  Instruction Review Code (retired)  2- Lawyer and Physiology of the Lungs: - Group verbal and written instruction with the use of models to provide basic lung anatomy and physiology related to function, structure and complications of lung disease.   Anatomy & Physiology of the Heart: - Group  verbal and written instruction and models provide basic cardiac anatomy and physiology, with the coronary electrical and arterial systems. Review of: AMI, Angina, Valve disease, Heart Failure, Cardiac Arrhythmia, Pacemakers, and the ICD.   Heart Failure: - Group verbal and written instruction on the basics of heart failure: signs/symptoms, treatments, explanation of ejection fraction, enlarged heart and cardiomyopathy.   Sleep Apnea: - Individual verbal and written instruction to review Obstructive Sleep Apnea. Review of risk factors, methods for diagnosing and types of masks and machines for OSA.   Anxiety: - Provides group, verbal and written instruction on the correlation between heart/lung disease and anxiety, treatment options, and management of anxiety.   Pulmonary Rehab from 09/17/2016 in [facility] Cardiac and Pulmonary Rehab  Date  02/13/16  Educator  [facility]  Instruction Review Code (retired)  2- Water quality scientist      Relaxation: - Provides group, verbal and written instruction about the benefits of relaxation for patients with heart/lung disease. Also provides patients with examples of relaxation techniques.   Pulmonary Rehab from 09/17/2016 in [facility] Cardiac and Pulmonary Rehab  Date  08/27/16  Educator  [facility]  Instruction Review Code (retired)  2- Meets goals/outcomes      Cardiac Medications: - Group verbal and written instruction to review commonly prescribed medications for heart disease. Reviews the medication, class of the drug, and side effects.   Know Your Numbers: -Group verbal and written instruction about important numbers in your health.  Review of Cholesterol, Blood Pressure, Diabetes, and BMI and the role they play in your overall health.   Other: -Provides group and verbal instruction on various topics (see comments)    Knowledge Questionnaire Score:    Core Components/Risk Factors/Patient Goals at Admission:   Core Components/Risk Factors/Patient Goals  Review:      Goals and Risk Factor Review    Row Name 05/20/16 0723 07/02/16 1559 08/27/16 1123 09/17/16 1108       Core Components/Risk Factors/Patient Goals Review  Personal Goals Review Improve shortness of breath with ADL's;Increase knowledge of respiratory medications and ability to use respiratory devices properly.;Develop more efficient breathing techniques such as purse lipped breathing and diaphragmatic breathing and practicing self-pacing with activity.;Weight Management/Obesity  - Weight Management/Obesity;Tobacco Cessation;Improve shortness of breath with ADL's Weight Management/Obesity;Tobacco Cessation;Improve shortness of breath with ADL's    Review [name] [name] is planning to meet with the dietitian. She is maintaining her weight in the low 140's with a goal of 120's. She has been cutting out sugar and fried foods. Since she has been on her BIPAP machine, [name] [name] states she has felt so much better, including her shortness of breath. She has a good understanding of her inhalers and uses her spacer. She has good technique with her PLB. Even though [name] [name] is under stress with her granddaughters illness, she has not smoked and continues to use nicotine gym. [name] [name] has only been able to attend 3 sessions in this 30 day period. Her granddaughter is very ill with cancer, and [name] [name] has missed several sessions due to her own health.  [name] [name] has lost weight since she was out of the program. Her weight loss could have been from her dreppresive state. She is eating better and exercising more currently. [name] [name] is not taking as much steroid medication which help curb her appitite. [name] [name] states she has gone 4 to 5 months without smoking. Shotness of breath has got better and is able to do more at home. The weather really affects her breathing and she does not go outside when it is hot and humid. [name] [name]'s weight has been fairly steady.  She is somewhat better mentally.  She is has been  doing better with her meds.  Her breathng is getting better at home and she is able to do more.  She also now has a home aide to help with getting things done around the house.  She continues to stay away from smoking.    Expected Outcomes Continue making progress with her self-management of her COPD. Continue in [place] with regular attendence. Short: improve her attendance in rehab. Long Term: Go two more months without smoking. Short: Continue to come to class regular to work on weight loss.  Long: Continue to work on risk factor modifications.        Core Components/Risk Factors/Patient Goals at Discharge (Final Review):      Goals and Risk Factor Review - 09/17/16 1108      Core Components/Risk Factors/Patient Goals Review   Personal Goals Review Weight Management/Obesity;Tobacco Cessation;Improve shortness of breath with ADL's   Review [name] [name]'s weight has been fairly steady.  She is somewhat better mentally.  She is has been doing better with her meds.  Her breathng is getting better at home and she is able to do more.  She also now has a home aide to help with getting things done around the house.  She continues to stay away from smoking.   Expected Outcomes Short: Continue to come to class regular to work on weight loss.  Long: Continue to work on risk factor modifications.       ITP Comments:     ITP Comments    Row Name 04/18/16 1214 05/07/16 1544 06/09/16 0732 07/07/16 0747 07/09/16 1352   ITP Comments [name] [name] has not attended since 04/02/16. [name] [name] granddaughter is terminally ill and needs help from [name] [name] on a regular basis which affects her ability to attend [facility].  30 day note review  by Dr Emily Filbert, Medical Director of [facility] 30 day note review by Dr Emily Filbert, Medical Director of [name] [name] has not been able to attend regularly due to her granddaughters illness.   [place] Name 08/04/16 0720 08/04/16 0727 08/08/16 1024 08/21/16 1031 08/25/16 0946   ITP  Comments Called 07/23/16, Last attended 06/27/16, Message was left for patient. 30 day note review with Medical Director of [facility] Dr. Emily Filbert Called to check on status of return.  Left message on voicemail Left a message for [name]. Gallick to see if everything was ok and if she would be returning to [place]. [name] [name] called to let us know that she wants to return.  She has been in a depression with everything that is going on with her granddaughter.   She hopes to return on Wednesday.   [place] Name 09/01/16 0850 09/29/16 0815         ITP Comments 30 day review completed ITP sent to Dr. Ramonita Lab for Dr. Emily Filbert Director of [place]. Continue with ITP unless changes are made by physician.  30 day review completed. ITP sent to Dr. Emily Filbert Director of [place]. Continue with ITP unless changes are made by physician.           Comments: 30 day review

## 2016-10-01 DIAGNOSIS — J9621 Acute and chronic respiratory failure with hypoxia: Secondary | ICD-10-CM | POA: Diagnosis not present

## 2016-10-01 DIAGNOSIS — J449 Chronic obstructive pulmonary disease, unspecified: Secondary | ICD-10-CM

## 2016-10-01 NOTE — Progress Notes (Signed)
Daily Session Note  Patient Details  Name: [name] [name] MRN: [id number] Date of Birth: 08/13/1960 Referring Provider:    Encounter Date: 10/01/2016  Check In:     Session Check In - 10/01/16 1014      Check-In   Location [facility]-Cardiac & Pulmonary Rehab   Staff Present Alberteen Sam, MA, ACSM RCEP, Exercise Physiologist;Melbert Botelho Oletta Darter, BA, ACSM CEP, Exercise Physiologist;Joseph Flavia Shipper   Supervising physician immediately available to respond to emergencies [facility] immediately available ER MD   Physician(s) Jimmye Norman and Mariea Clonts   Medication changes reported     No   Fall or balance concerns reported    No   Warm-up and Cool-down Performed as group-led instruction   Resistance Training Performed Yes   VAD Patient? No     Pain Assessment   Currently in Pain? No/denies         History  Smoking Status  . Former Smoker  . Packs/day: 0.50  . Years: 40.00  Smokeless Tobacco  . Never Used    Comment: quit smoking 09/26/15    Goals Met:  Independence with exercise equipment Exercise tolerated well No report of cardiac concerns or symptoms Strength training completed today  Goals Unmet:  Not Applicable  Comments: Reviewed RPE scale, THR and program prescription with pt today.  Pt voiced understanding and was given a copy of goals to take home.   Short: Use RPE daily to regulate intensity.  Long: Follow program prescription in THR.    Dr. Emily Filbert is Medical Director for [place] and [facility].

## 2016-10-03 ENCOUNTER — Telehealth: Payer: Self-pay | Admitting: *Deleted

## 2016-10-03 NOTE — Telephone Encounter (Signed)
[name] [name] called to let us know that she would not be in today. She is still having problems with her concentrator and hopes to get it fixed today.  She will return on Wednesday.

## 2016-10-07 ENCOUNTER — Other Ambulatory Visit: Payer: Self-pay | Admitting: Internal Medicine

## 2016-10-07 DIAGNOSIS — F411 Generalized anxiety disorder: Secondary | ICD-10-CM

## 2016-10-07 NOTE — Telephone Encounter (Signed)
Please prescribe as written

## 2016-10-08 ENCOUNTER — Encounter: Payer: Medicare Other | Attending: Pulmonary Disease

## 2016-10-08 DIAGNOSIS — J9621 Acute and chronic respiratory failure with hypoxia: Secondary | ICD-10-CM | POA: Diagnosis present

## 2016-10-08 DIAGNOSIS — J449 Chronic obstructive pulmonary disease, unspecified: Secondary | ICD-10-CM

## 2016-10-08 NOTE — Progress Notes (Signed)
Daily Session Note  Patient Details  Name: [name] [name] MRN: [id number] Date of Birth: August 10, 1960 Referring Provider:    Encounter Date: 10/08/2016  Check In:     Session Check In - 10/08/16 1035      Check-In   Location [facility]-Cardiac & Pulmonary Rehab   Staff Present Alberteen Sam, MA, ACSM RCEP, Exercise Physiologist;Amanda Oletta Darter, [place], ACSM CEP, Exercise Physiologist;Joseph Flavia Shipper   Supervising physician immediately available to respond to emergencies See telemetry face sheet for immediately available ER MD   Medication changes reported     No   Fall or balance concerns reported    No   Warm-up and Cool-down Performed as group-led instruction   Resistance Training Performed Yes   VAD Patient? No     Pain Assessment   Currently in Pain? No/denies         History  Smoking Status  . Former Smoker  . Packs/day: 0.50  . Years: 40.00  Smokeless Tobacco  . Never Used    Comment: quit smoking 09/26/15    Goals Met:  Independence with exercise equipment Exercise tolerated well No report of cardiac concerns or symptoms Strength training completed today  Goals Unmet:  Not Applicable  Comments: Pt able to follow exercise prescription today without complaint.  Will continue to monitor for progression.    Dr. Emily Filbert is Medical Director for [place] and [facility].

## 2016-10-08 NOTE — Telephone Encounter (Signed)
RX has been verbally called into pharmacy .

## 2016-10-10 ENCOUNTER — Encounter: Payer: Medicare Other | Admitting: *Deleted

## 2016-10-10 DIAGNOSIS — J449 Chronic obstructive pulmonary disease, unspecified: Secondary | ICD-10-CM

## 2016-10-10 DIAGNOSIS — J9621 Acute and chronic respiratory failure with hypoxia: Secondary | ICD-10-CM | POA: Diagnosis not present

## 2016-10-10 NOTE — Progress Notes (Signed)
Daily Session Note  Patient Details  Name: [name] [name] MRN: [id number] Date of Birth: 05/17/1960 Referring Provider:    Encounter Date: 10/10/2016  Check In:     Session Check In - 10/10/16 1030      Check-In   Location [facility]   Staff Present  , RN, BSN;Amanda Sommer, BA, ACSM CEP, Exercise Physiologist;Krista Spencer, RN BSN   Supervising physician immediately available to respond to emergencies [facility] immediately available ER MD   Physician(s) Dr. Forbach, K[place]   Medication changes reported     No   Fall or balance concerns reported    No   Tobacco Cessation No Change   Warm-up and Cool-down Performed on first and last piece of equipment   Resistance Training Performed Yes   VAD Patient? No     Pain Assessment   Currently in Pain? No/denies         History  Smoking Status  . Former Smoker  . Packs/day: 0.50  . Years: 40.00  Smokeless Tobacco  . Never Used    Comment: quit smoking 09/26/15    Goals Met:  Proper associated with RPD/PD & O2 Sat Independence with exercise equipment Using PLB without cueing & demonstrates good technique Exercise tolerated well Strength training completed today  Goals Unmet:  Not Applicable  Comments: Pt able to follow exercise prescription today without complaint.  Will continue to monitor for progression.     Dr. Mark Miller is Medical Director for [facility] and [facility]. 

## 2016-10-13 ENCOUNTER — Ambulatory Visit
Admission: RE | Admit: 2016-10-13 | Discharge: 2016-10-13 | Disposition: A | Discharge status: Home / Self Care | Payer: Medicare Other | Source: Ambulatory Visit | Attending: Physician Assistant | Admitting: Physician Assistant

## 2016-10-13 DIAGNOSIS — Z853 Personal history of malignant neoplasm of breast: Secondary | ICD-10-CM

## 2016-10-24 ENCOUNTER — Telehealth: Payer: Self-pay
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
## 2016-10-27 DIAGNOSIS — J9621 Acute and chronic respiratory failure with hypoxia: Secondary | ICD-10-CM | POA: Diagnosis not present

## 2016-10-27 DIAGNOSIS — J449 Chronic obstructive pulmonary disease, unspecified: Secondary | ICD-10-CM

## 2016-10-27 NOTE — Progress Notes (Signed)
Pulmonary Individual Treatment Plan  Patient Details  Name: [name] [name] MRN: [id number] Date of Birth: 1960/04/27 Referring Provider:    Initial Encounter Date:    Pulmonary Rehab from 01/22/2016 in [facility] Cardiac [facility] Pulmonary Rehab  Date  01/22/16      Visit Diagnosis: COPD, severe ([place])  Patient's Home Medications on Admission:  Current Outpatient Prescriptions:  .  albuterol (PROAIR HFA) 108 (90 Base) MCG/ACT inhaler, INHALE 2 PUFFS BY MOUTH EVERY 6 HOURS AS NEEDED FOR WHEEZING OR SHORTNESS OF BREATH., Disp: 3 Inhaler, Rfl: 3 .  ALPRAZolam (XANAX) 1 MG tablet, TAKE 1/2 TABLET BY MOUTH THREE TIMES DAILY AS NEEDED FOR ANXIETY, Disp: 30 tablet, Rfl: 3 .  aspirin 81 MG EC tablet, Take 1 tablet (81 mg total) by mouth daily., Disp: 30 tablet, Rfl: 1 .  cetirizine (ZYRTEC ALLERGY) 10 MG tablet, Take 1 tablet (10 mg total) by mouth daily., Disp: 90 tablet, Rfl: 3 .  fluticasone (FLONASE) 50 MCG/ACT nasal spray, Place 1 spray into both nostrils daily., Disp: 1 g, Rfl: 5 .  guaiFENesin (MUCINEX) 600 MG 12 hr tablet, Take 1 tablet (600 mg total) by mouth 2 (two) times daily as needed for cough or to loosen phlegm., Disp: 20 tablet, Rfl: 0 .  ipratropium-albuterol (DUONEB) 0.5-2.5 (3) MG/3ML SOLN, USE 3 ML VIA NEBULIZER EVERY 4 HOURS AS NEEDED FOR WHEEZING OR SHORTNESS OF BREATH, Disp: 360 mL, Rfl: 5 .  levocetirizine (XYZAL) 5 MG tablet, Take 5 mg by mouth every evening., Disp: , Rfl:  .  levofloxacin (LEVAQUIN) 500 MG tablet, Take 1 tablet (500 mg total) by mouth daily. (Patient not taking: Reported on 07/22/2016), Disp: 7 tablet, Rfl: 0 .  mometasone-formoterol (DULERA) 200-5 MCG/ACT AERO, Inhale 2 puffs into the lungs 2 (two) times daily., Disp: 3 Inhaler, Rfl: 3 .  predniSONE (DELTASONE) 5 MG tablet, Take 1 tablet (5 mg total) by mouth daily with breakfast., Disp: 30 tablet, Rfl: 5 .  QUEtiapine (SEROQUEL) 50 MG tablet, Take 1 tablet (50 mg total) by mouth at bedtime., Disp: 30 tablet,  Rfl: 0 .  tiotropium (SPIRIVA) 18 MCG inhalation capsule, Place 1 capsule (18 mcg total) into inhaler and inhale daily., Disp: 90 capsule, Rfl: 3  Past Medical History: Past Medical History:  Diagnosis Date  . Breast cancer ([place]) 2011   BILAT  lumpectomy 2011  . Cancer ([place])   . COPD (chronic obstructive pulmonary disease) ([facility])     Tobacco Use: History  Smoking Status  . Former Smoker  . Packs/day: 0.50  . Years: 40.00  Smokeless Tobacco  . Never Used    Comment: quit smoking 09/26/15    Labs: Recent Review Flowsheet Data    Labs for ITP Cardiac and Pulmonary Rehab Latest Ref Rng & Units 05/06/2015 05/08/2015 10/31/2015 10/31/2015 10/31/2015   Cholestrol 0 - 200 mg/dL - - - - -   LDLCALC 0 - 100 mg/dL - - - - -   HDL 40 - 60 mg/dL - - - - -   Trlycerides 0 - 200 mg/dL - - - - -   Hemoglobin A1c 4.8 - 5.6 % - - 5.0 - -   PHART 7.350 - 7.450 7.31(L) 7.36 - - -   PCO2ART 32.0 - 48.0 mmHg 57(H) 66(H) - - -   HCO3 20.0 - 28.0 mmol/L 28.7(H) 37.3(H) - 36.1(H) 35.9(H)   O2SAT % 96.3 96.3 - 98.2 97.4       Pulmonary Assessment Scores:     Pulmonary  Assessment Scores    Row Name 07/01/16 1547         ADL UCSD   ADL Phase Mid     SOB Score total 92     Rest 0     Walk 3     Stairs 5     Bath 4     Dress 4     Shop 5        Pulmonary Function Assessment:   Exercise Target Goals:    Exercise Program Goal: Individual exercise prescription set with THRR, safety & activity barriers. Participant demonstrates ability to understand and report RPE using BORG scale, to self-measure pulse accurately, and to acknowledge the importance of the exercise prescription.  Exercise Prescription Goal: Starting with aerobic activity 30 plus minutes a day, 3 days per week for initial exercise prescription. Provide home exercise prescription and guidelines that participant acknowledges understanding prior to discharge.  Activity Barriers & Risk Stratification:   6 Minute Walk:      6 Minute Walk    Row Name 06/11/16 1155         6 Minute Walk   Phase Mid Program     Distance 1020 feet     Distance % Change 3 %     Walk Time 6 minutes     # of Rest Breaks 0     MPH 1.93     METS 3.3     RPE 13     Perceived Dyspnea  3     VO2 Peak 11.53     Symptoms No     Resting HR 103 bpm     Resting BP 118/68     Max Ex. HR 120 bpm     Max Ex. BP 128/70       Interval HR   Baseline HR (retired) 103     1 Minute HR 112     2 Minute HR 115     3 Minute HR 118     4 Minute HR 119     5 Minute HR 120     6 Minute HR 120       Interval Oxygen   Baseline Oxygen Saturation % 93 %     Resting Liters of Oxygen 3 L     1 Minute Oxygen Saturation % 95 %     1 Minute Liters of Oxygen 3 L     2 Minute Oxygen Saturation % 97 %     2 Minute Liters of Oxygen 3 L     3 Minute Oxygen Saturation % 95 %     3 Minute Liters of Oxygen 3 L     4 Minute Oxygen Saturation % 94 %     4 Minute Liters of Oxygen 3 L     5 Minute Oxygen Saturation % 92 %     5 Minute Liters of Oxygen 3 L     6 Minute Oxygen Saturation % 89 %     6 Minute Liters of Oxygen 3 L     2 Minute Post Liters of Oxygen 3 L       Oxygen Initial Assessment:   Oxygen Re-Evaluation:     Oxygen Re-Evaluation    Row Name 05/20/16 0720 07/02/16 1555 08/27/16 1117 09/17/16 1122       Program Oxygen Prescription   Program Oxygen Prescription Continuous;Portable Concentrator Continuous;E-Tanks Continuous;E-Tanks Continuous;E-Tanks    Liters per minute '2 2 2 2    ' Comments  -  Increased to 3l/m with some exercise goals  - Increased to 3l/m with some exercise goals      Home Oxygen   Home Oxygen Device Portable Concentrator;Home Concentrator Portable Concentrator;Home Concentrator Portable Concentrator;Home Concentrator Portable Concentrator;Home Concentrator    Sleep Oxygen Prescription Continuous Continuous Continuous Continuous    Liters per minute '2 2 2 2    ' Home Exercise Oxygen Prescription Continuous  Continuous Continuous Continuous    Liters per minute 2 2  Increased to 3 l/m as needed 2 2    Home at Rest Exercise Oxygen Prescription Continuous Continuous Continuous Continuous    Liters per minute '2 2 2 2    ' Compliance with Home Oxygen Use Yes  - No No    Comments  -  - -  portable concentrator is too heavy and wants to get a smaller one  -      Goals/Expected Outcomes   Short Term Goals  -  - To learn and exhibit compliance with exercise, home and travel O2 prescription;To Learn and understand importance of maintaining oxygen saturations>88%;To learn and demonstrate proper use of respiratory medications;To learn and understand importance of monitoring SPO2 with pulse oximeter and demonstrate accurate use of the pulse oximeter.;To learn and demonstrate proper purse lipped breathing techniques or other breathing techniques. To learn and exhibit compliance with exercise, home and travel O2 prescription;To Learn and understand importance of maintaining oxygen saturations>88%;To learn and demonstrate proper use of respiratory medications;To learn and understand importance of monitoring SPO2 with pulse oximeter and demonstrate accurate use of the pulse oximeter.;To learn and demonstrate proper purse lipped breathing techniques or other breathing techniques.    Long  Term Goals  -  - Exhibits compliance with exercise, home and travel O2 prescription;Maintenance of O2 saturations>88%;Compliance with respiratory medication;Demonstrates proper use of MDI's;Exhibits proper breathing techniques, such as purse lipped breathing or other method taught during program session;Verbalizes importance of monitoring SPO2 with pulse oximeter and return demonstration Exhibits compliance with exercise, home and travel O2 prescription;Maintenance of O2 saturations>88%;Compliance with respiratory medication;Demonstrates proper use of MDI's;Exhibits proper breathing techniques, such as purse lipped breathing or other  method taught during program session;Verbalizes importance of monitoring SPO2 with pulse oximeter and return demonstration    Comments Ms Servidio has a good understanding of her oxygen and uses 2-3l/m with exercise in [facility]. Ms Lahmann is very independent with her oxygen. She uses 2 l/m, but she will increase her flow to 3l/m with activity. Pt is using her concentrator at home but not when she is going places. She states it is too heavy to carry around and is looking into getting a smaller.  [name] [name] is staying compliant with her oxygen use.  She has not had any problems with her medications and staying on top of them.  She is using her PLB all the time and finds it very helpful.      Goals/Expected Outcomes Continue using her oxygen with activity and self-monitor her oxygen saturations with her finger oximeter. Continue to self-manage her oxygen with her COPD. Short term: To obtain a smaller concentrator. Long Term: continue to self manage her COPD Short: continue to think about a smaller concentrator.  Long: Continue to self manage COPD.       Oxygen Discharge (Final Oxygen Re-Evaluation):     Oxygen Re-Evaluation - 09/17/16 1122      Program Oxygen Prescription   Program Oxygen Prescription Continuous;E-Tanks   Liters per minute 2   Comments Increased to 3l/m with some  exercise goals     Home Oxygen   Home Oxygen Device Portable Concentrator;Home Concentrator   Sleep Oxygen Prescription Continuous   Liters per minute 2   Home Exercise Oxygen Prescription Continuous   Liters per minute 2   Home at Rest Exercise Oxygen Prescription Continuous   Liters per minute 2   Compliance with Home Oxygen Use No     Goals/Expected Outcomes   Short Term Goals To learn and exhibit compliance with exercise, home and travel O2 prescription;To Learn and understand importance of maintaining oxygen saturations>88%;To learn and demonstrate proper use of respiratory medications;To learn and understand  importance of monitoring SPO2 with pulse oximeter and demonstrate accurate use of the pulse oximeter.;To learn and demonstrate proper purse lipped breathing techniques or other breathing techniques.   Long  Term Goals Exhibits compliance with exercise, home and travel O2 prescription;Maintenance of O2 saturations>88%;Compliance with respiratory medication;Demonstrates proper use of MDI's;Exhibits proper breathing techniques, such as purse lipped breathing or other method taught during program session;Verbalizes importance of monitoring SPO2 with pulse oximeter and return demonstration   Comments [name] [name] is staying compliant with her oxygen use.  She has not had any problems with her medications and staying on top of them.  She is using her PLB all the time and finds it very helpful.     Goals/Expected Outcomes Short: continue to think about a smaller concentrator.  Long: Continue to self manage COPD.      Initial Exercise Prescription:   Perform Capillary Blood Glucose checks as needed.  Exercise Prescription Changes:     Exercise Prescription Changes    Row Name 05/01/16 1000 05/14/16 1200 05/28/16 1400 06/11/16 1200 09/05/16 0900     Response to Exercise   Blood Pressure (Admit) 108/60 122/74 122/84 118/68 130/70   Blood Pressure (Exercise) 138/42 130/64 140/70 126/64 110/64   Blood Pressure (Exit) 104/64 134/74 112/66 126/72 130/70   Heart Rate (Admit) 107 bpm 94 bpm 89 bpm 103 bpm 91 bpm   Heart Rate (Exercise) 120 bpm 111 bpm 116 bpm 120 bpm 114 bpm   Heart Rate (Exit) 102 bpm 94 bpm 94 bpm 100 bpm 101 bpm   Oxygen Saturation (Admit) 98 % 97 % 93 % 93 % 96 %   Oxygen Saturation (Exercise) 90 % 99 % 96 % 89 % 96 %   Oxygen Saturation (Exit) 97 % 98 % 98 % 96 % 97 %   Rating of Perceived Exertion (Exercise) '13 14 13 13 13   ' Perceived Dyspnea (Exercise) '3 2 3 3 3   ' Duration Progress to 45 minutes of aerobic exercise without signs/symptoms of physical distress Progress to 45 minutes  of aerobic exercise without signs/symptoms of physical distress Continue with 45 min of aerobic exercise without signs/symptoms of physical distress. Continue with 45 min of aerobic exercise without signs/symptoms of physical distress. Continue with 45 min of aerobic exercise without signs/symptoms of physical distress.   Intensity THRR unchanged THRR unchanged  - THRR unchanged THRR unchanged     Progression   Progression Continue to progress workloads to maintain intensity without signs/symptoms of physical distress. Continue to progress workloads to maintain intensity without signs/symptoms of physical distress. Continue to progress workloads to maintain intensity without signs/symptoms of physical distress. Continue to progress workloads to maintain intensity without signs/symptoms of physical distress. Continue to progress workloads to maintain intensity without signs/symptoms of physical distress.   Average METs  -  -  -  - 2.5  Resistance Training   Training Prescription Yes Yes Yes Yes Yes   Weight '3 3 3 3 3   ' Reps 10-15 10-15 10-15 10-15 10-15     Interval Training   Interval Training No No No No  -     Oxygen   Oxygen Continuous Continuous Continuous Continuous Continuous   Liters '3 3 3 3 3     ' Treadmill   MPH  -  - 1.3  - 1.3   Grade  -  - 0.5  - 1   Minutes  -  - 15  - 15     NuStep   Level 5 4  - 4 4   Minutes 15 15  - 15 15   METs  - 2.1  - 2.1 2.5     REL-XR   Level '1 1 1 2 4   ' Minutes '15 15 15 15 15   ' METs 3 5.7  - 2.2 2.7   Row Name 09/17/16 1200 10/01/16 1200 10/15/16 1200         Response to Exercise   Blood Pressure (Admit) 114/64 126/98 110/62     Blood Pressure (Exit) 112/60 126/74 104/60     Heart Rate (Admit) 101 bpm 114 bpm 105 bpm     Heart Rate (Exercise) 139 bpm 136 bpm 121 bpm     Heart Rate (Exit) 97 bpm 97 bpm 102 bpm     Oxygen Saturation (Admit) 94 % 94 % 95 %     Oxygen Saturation (Exercise) 91 % 91 % 95 %     Oxygen Saturation (Exit)  97 % 113 % 97 %     Rating of Perceived Exertion (Exercise) 13  -  -     Perceived Dyspnea (Exercise) 3  -  -     Duration Continue with 45 min of aerobic exercise without signs/symptoms of physical distress. Continue with 45 min of aerobic exercise without signs/symptoms of physical distress. Continue with 45 min of aerobic exercise without signs/symptoms of physical distress.     Intensity THRR unchanged THRR unchanged THRR unchanged       Progression   Progression Continue to progress workloads to maintain intensity without signs/symptoms of physical distress. Continue to progress workloads to maintain intensity without signs/symptoms of physical distress. Continue to progress workloads to maintain intensity without signs/symptoms of physical distress.     Average METs 2.5 2.7 2.4       Resistance Training   Training Prescription Yes Yes Yes     Weight '3 3 4     ' Reps 10-15 10-15 10-15       Oxygen   Oxygen Continuous Continuous Continuous     Liters '3 3 3       ' Treadmill   MPH 1.'6 2 2     ' Grade 1.5 0.5 1.5     Minutes '15 15 15       ' NuStep   Level  - 4  -     Minutes  - 15  -     METs  - 2.6  -       REL-XR   Level '1 4 4     ' Minutes '15 15 15     ' METs 2.4 2.8 1.8        Exercise Comments:     Exercise Comments    Row Name 05/01/16 1105 05/14/16 1230 05/28/16 1412 06/11/16 1211     Exercise Comments Markie tolerates exercise well when  she attends.  She has some family conflicts that interfere with regular attendance. [name] [name] has not been able to attend consistently due to other family issues. [name] [name] has tolerated exrecise well and has attended regularly during April. [name] [name] is tolerating exercise well and has been consistent in her attendance in April.       Exercise Goals and Review:   Exercise Goals Re-Evaluation :     Exercise Goals Re-Evaluation    Row Name 08/27/16 1129 09/05/16 0926 09/17/16 1059 09/17/16 1214 10/01/16 1227     Exercise Goal Re-Evaluation    Exercise Goals Review Increase Physical Activity;Increase Strenth and Stamina Increase Physical Activity;Increase Strenth and Stamina Increase Physical Activity;Increase Strenth and Stamina Increase Physical Activity Increase Physical Activity;Increase Strength and Stamina;Able to understand and use rate of perceived exertion (RPE) scale;Able to understand and use Dyspnea scale   Comments patient is exercising at home more. She is doing weights and chair exercises that she has done in class. She has not had any walking at home due to the weather. [name] [name] states she is able to attend more regularly at this time.  She has increased her overall MET level. [name] [name] has been doing well in rehab. She missed a couple of days from being sick.  She enjoys coming to rehab and getting out of the house.  She has not been able to exercise as much as she would like at home due to the humidity.  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 [name] [name] has been able to attend class more often. [name] [name] has shown good progress since attending regularly.  She has increased levels on all machines.   Expected Outcomes Short: To improve attendance to imrpove physical activity. Long: Continue to exercise at home and in class. Short - [name] [name] will attend regularly.  Long - markie will complete the [employer] program. Short: Continue to attend class regularly.  Long: Start to exercise more at home and join [employer]. Short - [name] [name] will achieve best results with regular attendance.  Long - [name] [name] will continue to exercise on her own. Short - [name] [name] will continue to attend regularly.  Long - [name] [name] will continue to exercise on her own.   [place] Name 10/15/16 1238             Exercise Goal Re-Evaluation   Exercise Goals Review Increase Physical Activity;Increase Strength and Stamina       Comments [name] [name] continues to tolerate exercise well.  She has 7 sessions of LW remaining.       Expected Outcomes Short - [name] [name] will complete LW.   Long - [name] [name] will continue to exercise on her own.          Discharge Exercise Prescription (Final Exercise Prescription Changes):     Exercise Prescription Changes - 10/15/16 1200      Response to Exercise   Blood Pressure (Admit) 110/62   Blood Pressure (Exit) 104/60   Heart Rate (Admit) 105 bpm   Heart Rate (Exercise) 121 bpm   Heart Rate (Exit) 102 bpm   Oxygen Saturation (Admit) 95 %   Oxygen Saturation (Exercise) 95 %   Oxygen Saturation (Exit) 97 %   Duration Continue with 45 min of aerobic exercise without signs/symptoms of physical distress.   Intensity THRR unchanged     Progression   Progression Continue to progress workloads to maintain intensity without signs/symptoms of physical distress.   Average METs 2.4     Resistance Training   Training Prescription Yes  Weight 4   Reps 10-15     Oxygen   Oxygen Continuous   Liters 3     Treadmill   MPH 2   Grade 1.5   Minutes 15     REL-XR   Level 4   Minutes 15   METs 1.8      Nutrition:  Target Goals: Understanding of nutrition guidelines, daily intake of sodium <1581m, cholesterol <2038m calories 30% from fat and 7% or less from saturated fats, daily to have 5 or more servings of fruits and vegetables.  Biometrics:    Nutrition Therapy Plan and Nutrition Goals:   Nutrition Discharge: Rate Your Plate Scores:   Nutrition Goals Re-Evaluation:     Nutrition Goals Re-Evaluation    Row Name 08/27/16 1130 09/17/16 1114           Goals   Nutrition Goal Continue to maintain her weight loss with improved eating habits Meet with nutirtionist       Comment she has lost weight, eating better and not eating as much MaBonnita Hollowas an appointment to meet with dietician on 9/4.      Expected Outcome Short: Mainatin weight loss. Long: Continue health eating Short: Meet with nutrtitionist  Long: Work on weight loss eating.         Nutrition Goals Discharge (Final Nutrition Goals Re-Evaluation):      Nutrition Goals Re-Evaluation - 09/17/16 1114      Goals   Nutrition Goal Meet with nutirtionist    Comment MaBonnita Hollowas an appointment to meet with dietician on 9/4.   Expected Outcome Short: Meet with nutrtitionist  Long: Work on weight loss eating.      Psychosocial: Target Goals: Acknowledge presence or absence of significant depression and/or stress, maximize coping skills, provide positive support system. Participant is able to verbalize types and ability to use techniques and skills needed for reducing stress and depression.   Initial Review & Psychosocial Screening:   Quality of Life Scores:   PHQ-9: Recent Review Flowsheet Data    [facility]   Decreased Interest 2   Down, Depressed, Hopeless 2   PHQ - 2 Score 4   Altered sleeping 3   Tired, decreased energy 3   Change in appetite 0   Feeling bad or failure about yourself  3   Trouble concentrating 1   Moving slowly or fidgety/restless 1   Suicidal thoughts 0   PHQ-9 Score 15   Difficult doing work/chores Very difficult     Interpretation of Total Score  Total Score Depression Severity:  1-4 = Minimal depression, 5-9 = Mild depression, 10-14 = Moderate depression, 15-19 = Moderately severe depression, 20-27 = Severe depression   Psychosocial Evaluation and Intervention:   Psychosocial Re-Evaluation:     Psychosocial Re-Evaluation    Ro[place]okletame 05/14/16 1040 05/21/16 1121 06/04/16 1118 07/02/16 1603 08/27/16 1132     Psychosocial Re-Evaluation   Current issues with  -  - Current Stress Concerns  - Current Depression;Current Stress Concerns   Comments [facility] stating she has been coming sporatically due to her family situation with the granddaughter's health.  She reports her daughter (the mother of her granddaughter) is not doing well "at all" and she is very concerned about her.  Her other daughter has just taken a leave of absence from work to help [facility] family.  Counselor commended [facility] even being here today with all that is going  on in her life.  She reports her family and faith are her constant supports to help her through this.  Counselor encouraged her to continue to take time for herself so she can be more able to help others.  Counselor and staff will continue to follow with [facility] through this difficult time.   Counselor follow up with [facility] today reporting things are "somewhat better" with her daughter currently as the "[employer] are flying the family - including her granddaughter - to [place] to visit family this upcoming weekend.  They will be accompanied by a nurse as well to help this process.  [name] [name] states she will not be going; but is looking very forward to the break and a weekend of relaxation just for herself!  Counselor commended her on setting healthy boundaries and limits with her family in order to improve her self-care.  Staff will continue to follow with [facility].  [name] [name] came in today looking a little down.  Her granddaughter is back in the hosiptal as the [employer] trip was too much for her.  She said that her granddaughter continues to fight and is filled with the [place].  Her and [name] [name] have a very special bond.  [name] [name] is trying to be strong for her daughter and granddaughter.  She continues to spend time in prayer and looking upward for guideance.  Her granddaughter is ready and in a good place, her main concern is her daughter and her mental health.  She was headed back over for another visit.  She is going to continue to pray constantly.  [name] [name] is back today and has been in a deep depression. She has sought treatment, started medication and counseling, which both seem to be helping. Her biggest stressor right now is the pressure of being the matriarch of the family and showing she has everything together when she does not. She has been coping by talking with God and praying constantly. [name] [name] does not have a  strong support system because all of her family lives elsewhere. She has come to peace with everything that has happened to her granddaughter but it still stresses her out.   Expected Outcomes  -  - [name] [name] will rely on her faith to maintain her positive outlook.  She will be able to be there for her daughter and still take care of herself. Ms Sherlin continues to care for her daughter and granddaughter. Unfortunately her granddaughter is progressing with her cancer .  With the help of her Darrick Meigs faith, Ms Mcilvaine supports them physically and emotionally . Regular attendence is hard for Ms Perdomo. She is commended for her strength is this situation. Short: better attendance to rehab as an oulet for a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

## 2016-10-27 NOTE — Progress Notes (Signed)
Daily Session Note  Patient Details  Name: [name] [name] MRN: [id number] Date of Birth: 1960/07/24 Referring Provider:    Encounter Date: 10/27/2016  Check In:     Session Check In - 10/27/16 1012      Check-In   Location [facility]-Cardiac & Pulmonary Rehab   Staff Present Nada Maclachlan, BA, ACSM CEP, Exercise Physiologist;Kelly Amedeo Plenty, BS, ACSM CEP, Exercise Physiologist;Anjolaoluwa Siguenza Flavia Shipper   Supervising physician immediately available to respond to emergencies [facility] immediately available ER MD   Physician(s) Dr. Clearnce Hasten and [facility]   Medication changes reported     No   Fall or balance concerns reported    No   Warm-up and Cool-down Performed as group-led instruction   Resistance Training Performed Yes   VAD Patient? No     Pain Assessment   Currently in Pain? No/denies   Multiple Pain Sites No         History  Smoking Status  . Former Smoker  . Packs/day: 0.50  . Years: 40.00  Smokeless Tobacco  . Never Used    Comment: quit smoking 09/26/15    Goals Met:  Independence with exercise equipment Exercise tolerated well No report of cardiac concerns or symptoms Strength training completed today  Goals Unmet:  Not Applicable  Comments: Pt able to follow exercise prescription today without complaint.  Will continue to monitor for progression.   Dr. Emily Filbert is Medical Director for [place] and [facility]monary Rehabilitation.

## 2016-11-05 ENCOUNTER — Encounter: Payer: Medicare Other | Attending: Pulmonary Disease

## 2016-11-05 DIAGNOSIS — J449 Chronic obstructive pulmonary disease, unspecified: Secondary | ICD-10-CM

## 2016-11-05 DIAGNOSIS — J9621 Acute and chronic respiratory failure with hypoxia: Secondary | ICD-10-CM | POA: Insufficient documentation

## 2016-11-05 NOTE — Progress Notes (Signed)
Daily Session Note  Patient Details  Name: [name] [name] MRN: [id number] Date of Birth: 02-11-1960 Referring Provider:    Encounter Date: 11/05/2016  Check In:     Session Check In - 11/05/16 1030      Check-In   Location [facility]-Cardiac & Pulmonary Rehab   Staff Present Alberteen Sam, MA, ACSM RCEP, Exercise Physiologist;Amanda Oletta Darter, BA, ACSM CEP, Exercise Physiologist;Mosiah Bastin Flavia Shipper   Supervising physician immediately available to respond to emergencies [facility] immediately available ER MD   Physician(s) Dr. Clearnce Hasten and Jimmye Norman   Medication changes reported     No   Fall or balance concerns reported    No   Warm-up and Cool-down Performed as group-led instruction   Resistance Training Performed Yes   VAD Patient? No     Pain Assessment   Currently in Pain? No/denies   Multiple Pain Sites No         History  Smoking Status  . Former Smoker  . Packs/day: 0.50  . Years: 40.00  Smokeless Tobacco  . Never Used    Comment: quit smoking 09/26/15    Goals Met:  Independence with exercise equipment Exercise tolerated well No report of cardiac concerns or symptoms Strength training completed today  Goals Unmet:  Not Applicable  Comments: Pt able to follow exercise prescription today without complaint.  Will continue to monitor for progression.   Dr. Emily Filbert is Medical Director for [place] and [facility] Pulmonary Rehabilitation.

## 2016-11-07 ENCOUNTER — Encounter: Payer: Medicare Other | Admitting: *Deleted

## 2016-11-07 DIAGNOSIS — J9621 Acute and chronic respiratory failure with hypoxia: Secondary | ICD-10-CM | POA: Diagnosis not present

## 2016-11-07 DIAGNOSIS — J449 Chronic obstructive pulmonary disease, unspecified: Secondary | ICD-10-CM

## 2016-11-07 NOTE — Progress Notes (Signed)
Daily Session Note  Patient Details  Name: [name] [name] MRN: [id number] Date of Birth: 04/09/1960 Referring Provider:    Encounter Date: 11/07/2016  Check In:     Session Check In - 11/07/16 1017      Check-In   Location [facility]   Staff Present Gerlene Burdock, RN, Vickki Hearing, BA, ACSM CEP, Exercise Physiologist;Joseph Flavia Shipper   Supervising physician immediately available to respond to emergencies [facility] immediately available ER MD   Physician(s) Dr. Corky Downs and Dr. Reita Cliche   Medication changes reported     No   Fall or balance concerns reported    No   Tobacco Cessation No Change   Warm-up and Cool-down Performed as group-led instruction   Resistance Training Performed Yes   VAD Patient? No     Pain Assessment   Currently in Pain? No/denies         History  Smoking Status  . Former Smoker  . Packs/day: 0.50  . Years: 40.00  Smokeless Tobacco  . Never Used    Comment: quit smoking 09/26/15    Goals Met:  Proper associated with RPD/PD & O2 Sat Independence with exercise equipment Exercise tolerated well Strength training completed today  Goals Unmet:  Not Applicable  Comments: Pt able to follow exercise prescription today without complaint.  Will continue to monitor for progression.     Dr. Emily Filbert is Medical Director for [place] and [facility].

## 2016-11-08 ENCOUNTER — Other Ambulatory Visit: Payer: Self-pay | Admitting: Internal Medicine

## 2016-11-08 DIAGNOSIS — J439 Emphysema, unspecified: Secondary | ICD-10-CM

## 2016-11-10 VITALS — Ht 63.0 in | Wt 145.0 lb

## 2016-11-10 DIAGNOSIS — J9621 Acute and chronic respiratory failure with hypoxia: Secondary | ICD-10-CM | POA: Diagnosis not present

## 2016-11-10 DIAGNOSIS — J449 Chronic obstructive pulmonary disease, unspecified: Secondary | ICD-10-CM

## 2016-11-10 NOTE — Progress Notes (Signed)
Daily Session Note  Patient Details  Name: [name] [name] MRN: [id number] Date of Birth: [birth year] Referring Provider:    Encounter Date: 11/10/2016  Check In:     Session Check In - 11/10/16 1008      Check-In   Location [facility]   Staff Present Nada Maclachlan, BA, ACSM CEP, Exercise Physiologist;Jen Benedict Tedd Sias, [place], ACSM CEP, Exercise Physiologist   Supervising physician immediately available to respond to emergencies [facility] immediately available ER MD   Physician(s) Dr. Jimmye Norman and [facility]   Medication changes reported     No   Fall or balance concerns reported    No   Warm-up and Cool-down Performed as group-led instruction   Resistance Training Performed Yes   VAD Patient? No     Pain Assessment   Currently in Pain? No/denies   Multiple Pain Sites No         History  Smoking Status  . Former Smoker  . Packs/day: 0.50  . Years: 40.00  Smokeless Tobacco  . Never Used    Comment: quit smoking 09/26/15    Goals Met:  Proper associated with RPD/PD & O2 Sat Independence with exercise equipment Using PLB without cueing & demonstrates good technique Exercise tolerated well No report of cardiac concerns or symptoms Strength training completed today  Goals Unmet:  Not Applicable  Comments:      [place] Name 06/11/16 1155 11/10/16 1055       6 Minute Walk   Phase Mid Program Discharge    Distance 1020 feet 1215 feet    Distance % Change 3 % 22.7 %    Distance Feet Change  - 225 ft    Walk Time 6 minutes 6 minutes    # of Rest Breaks 0 0    MPH 1.93 2.3    METS 3.3 4.03    RPE 13 11    Perceived Dyspnea  3 1    VO2 Peak 11.53 14.13    Symptoms No  -    Resting HR 103 bpm 110 bpm    Resting BP 118/68 138/70    Resting Oxygen Saturation   - 96 %    Exercise Oxygen Saturation  during 6 min walk  - 91 %    Max Ex. HR 120 bpm 136 bpm    Max Ex. BP 128/70 152/80    2 Minute Post BP   - 130/80      Interval HR   Baseline HR (retired) 103  -    1 Minute HR 112 110    2 Minute HR 115 121    3 Minute HR 118 127    4 Minute HR 119 128    5 Minute HR 120 122    6 Minute HR 120 136    2 Minute Post HR  - 117    Interval Heart Rate?  - Yes      Interval Oxygen   Interval Oxygen?  - Yes    Baseline Oxygen Saturation % 93 % 96 %    Resting Liters of Oxygen 3 L  -    1 Minute Oxygen Saturation % 95 % 93 %    1 Minute Liters of Oxygen 3 L 3 L    2 Minute Oxygen Saturation % 97 % 93 %    2 Minute Liters of Oxygen 3 L 3 L    3 Minute Oxygen Saturation % 95 %  91 %    3 Minute Liters of Oxygen 3 L 3 L    4 Minute Oxygen Saturation % 94 % 93 %    4 Minute Liters of Oxygen 3 L 3 L    5 Minute Oxygen Saturation % 92 % 92 %    5 Minute Liters of Oxygen 3 L 3 L    6 Minute Oxygen Saturation % 89 % 92 %    6 Minute Liters of Oxygen 3 L 3 L    2 Minute Post Oxygen Saturation %  - 97 %    2 Minute Post Liters of Oxygen 3 L 3 L      Pt able to follow exercise prescription today without complaint.  Will continue to monitor for progression.    Dr. Emily Filbert is Medical Director for [place] and [facility].

## 2016-11-10 NOTE — Telephone Encounter (Signed)
Patient daily regimen of Prednisone is 5 mg not 20 mg therefore 20 mg declined.

## 2016-11-17 DIAGNOSIS — J449 Chronic obstructive pulmonary disease, unspecified: Secondary | ICD-10-CM

## 2016-11-17 DIAGNOSIS — J9621 Acute and chronic respiratory failure with hypoxia: Secondary | ICD-10-CM | POA: Diagnosis not present

## 2016-11-17 NOTE — Progress Notes (Signed)
Daily Session Note  Patient Details  Name: [name] [name] MRN: [id number] Date of Birth: [birth year] Referring Provider:    Encounter Date: 11/17/2016  Check In:     Session Check In - 11/17/16 1016      Check-In   Location [facility]   Staff Present Earlean Shawl, BS, ACSM CEP, Exercise Physiologist;Laureen Owens Shark, BS, RRT, Respiratory Therapist;Joseph [place] physician immediately available to respond to emergencies [facility] immediately available ER MD   Physician(s) Dr. Cherylann Banas and Corky Downs   Medication changes reported     No   Fall or balance concerns reported    No   Warm-up and Cool-down Performed as group-led instruction   Resistance Training Performed Yes   VAD Patient? No     Pain Assessment   Currently in Pain? No/denies   Multiple Pain Sites No         History  Smoking Status  . Former Smoker  . Packs/day: 0.50  . Years: 40.00  Smokeless Tobacco  . Never Used    Comment: quit smoking 09/26/15    Goals Met:  Proper associated with RPD/PD & O2 Sat Independence with exercise equipment Exercise tolerated well No report of cardiac concerns or symptoms Strength training completed today  Goals Unmet:  Not Applicable  Comments: Pt able to follow exercise prescription today without complaint.  Will continue to monitor for progression.   Dr. Emily Filbert is Medical Director for [place] and [facility].

## 2016-11-19 DIAGNOSIS — J9621 Acute and chronic respiratory failure with hypoxia: Secondary | ICD-10-CM | POA: Diagnosis not present

## 2016-11-19 DIAGNOSIS — J449 Chronic obstructive pulmonary disease, unspecified: Secondary | ICD-10-CM

## 2016-11-19 NOTE — Progress Notes (Signed)
Daily Session Note  Patient Details  Name: MARITZA HOSTERMAN MRN: [id number] Date of Birth: [birth year] Referring Provider:    Encounter Date: 11/19/2016  Check In:     Session Check In - 11/19/16 1012      Check-In   Location [facility]   Staff Present Nada Maclachlan, BA, ACSM CEP, Exercise Physiologist;Krista Frederico Hamman, RN BSN;Zanasia Hickson Flavia Shipper   Supervising physician immediately available to respond to emergencies [facility] immediately available ER MD   Physician(s) Dr. Mariea Clonts and Joni Fears   Medication changes reported     No   Fall or balance concerns reported    No   Warm-up and Cool-down Performed as group-led instruction   Resistance Training Performed Yes   VAD Patient? No     Pain Assessment   Currently in Pain? No/denies   Multiple Pain Sites No         History  Smoking Status  . Former Smoker  . Packs/day: 0.50  . Years: 40.00  Smokeless Tobacco  . Never Used    Comment: quit smoking 09/26/15    Goals Met:  Independence with exercise equipment Exercise tolerated well No report of cardiac concerns or symptoms Strength training completed today  Goals Unmet:  Not Applicable  Comments: Pt able to follow exercise prescription today without complaint.  Will continue to monitor for progression.   Dr. Emily Filbert is Medical Director for [place] and [facility].

## 2016-11-21 DIAGNOSIS — J9621 Acute and chronic respiratory failure with hypoxia: Secondary | ICD-10-CM | POA: Diagnosis not present

## 2016-11-21 DIAGNOSIS — J449 Chronic obstructive pulmonary disease, unspecified: Secondary | ICD-10-CM

## 2016-11-21 NOTE — Progress Notes (Signed)
Daily Session Note  Patient Details  Name: CHANDRA ASHER MRN: [id number] Date of Birth: Jul 18, 1960 Referring Provider:    Encounter Date: 11/21/2016  Check In:     Session Check In - 11/21/16 0954      Check-In   Location [facility]   Staff Present Nada Maclachlan, BA, ACSM CEP, Exercise Physiologist;Joseph Alcus Dad, RN BSN   Supervising physician immediately available to respond to emergencies [facility] immediately available ER MD   Physician(s) Dr. Kerman Passey and Quentin Cornwall   Medication changes reported     No   Fall or balance concerns reported    No   Warm-up and Cool-down Performed as group-led instruction   Resistance Training Performed Yes   VAD Patient? No     Pain Assessment   Currently in Pain? No/denies   Multiple Pain Sites No         History  Smoking Status  . Former Smoker  . Packs/day: 0.50  . Years: 40.00  Smokeless Tobacco  . Never Used    Comment: quit smoking 09/26/15    Goals Met:  Proper associated with RPD/PD & O2 Sat Independence with exercise equipment Exercise tolerated well No report of cardiac concerns or symptoms Strength training completed today  Goals Unmet:  Not Applicable  Comments:  Javanna graduated today from cardiac rehab with 36 sessions completed.  Details of the patient's exercise prescription and what She needs to do in order to continue the prescription and progress were discussed with patient.  Patient was given a copy of prescription and goals.  Patient verbalized understanding.  Stacey plans to continue to exercise by joining [employer]   Dr. Emily Filbert is Medical Director for [place] and [facility].

## 2016-11-21 NOTE — Patient Instructions (Signed)
Discharge Instructions  Patient Details  Name: [name] [name] MRN: [id number] Date of Birth: 1960/07/27 Referring Provider:  Letta Median, MD   Number of Visits: 36/36  Reason for Discharge:  Patient reached a stable level of exercise. Patient independent in their exercise. Patient has met program and personal goals.  Smoking History:  History  Smoking Status  . Former Smoker  . Packs/day: 0.50  . Years: 40.00  Smokeless Tobacco  . Never Used    Comment: quit smoking 09/26/15    Diagnosis:  COPD, severe (HCC)  Initial Exercise Prescription:   Discharge Exercise Prescription (Final Exercise Prescription Changes):     Exercise Prescription Changes - 11/12/16 1100      Response to Exercise   Blood Pressure (Admit) 138/70   Blood Pressure (Exit) 120/60   Heart Rate (Admit) 125 bpm   Heart Rate (Exercise) 136 bpm   Heart Rate (Exit) 109 bpm   Oxygen Saturation (Admit) 95 %   Oxygen Saturation (Exercise) 91 %   Oxygen Saturation (Exit) 96 %   Duration Continue with 45 min of aerobic exercise without signs/symptoms of physical distress.   Intensity THRR unchanged     Progression   Progression Continue to progress workloads to maintain intensity without signs/symptoms of physical distress.     Resistance Training   Training Prescription Yes   Weight 4   Reps 10-15     Oxygen   Oxygen Continuous   Liters 3     NuStep   Level 5   Minutes 15     REL-XR   Level 4   Minutes 15      Functional Capacity:     6 Minute Walk    [place] Name 06/11/16 1155 11/10/16 1055       6 Minute Walk   Phase Mid Program Discharge    Distance 1020 feet 1215 feet    Distance % Change 3 % 22.7 %    Distance Feet Change  - 225 ft    Walk Time 6 minutes 6 minutes    # of Rest Breaks 0 0    MPH 1.93 2.3    METS 3.3 4.03    RPE 13 11    Perceived Dyspnea  3 1    VO2 Peak 11.53 14.13    Symptoms No  -    Resting HR 103 bpm 110 bpm    Resting BP 118/68  138/70    Resting Oxygen Saturation   - 96 %    Exercise Oxygen Saturation  during 6 min walk  - 91 %    Max Ex. HR 120 bpm 136 bpm    Max Ex. BP 128/70 152/80    2 Minute Post BP  - 130/80      Interval HR   Baseline HR (retired) 103  -    1 Minute HR 112 110    2 Minute HR 115 121    3 Minute HR 118 127    4 Minute HR 119 128    5 Minute HR 120 122    6 Minute HR 120 136    2 Minute Post HR  - 117    Interval Heart Rate?  - Yes      Interval Oxygen   Interval Oxygen?  - Yes    Baseline Oxygen Saturation % 93 % 96 %    Resting Liters of Oxygen 3 L  -    1 Minute Oxygen Saturation % 95 %  93 %    1 Minute Liters of Oxygen 3 L 3 L    2 Minute Oxygen Saturation % 97 % 93 %    2 Minute Liters of Oxygen 3 L 3 L    3 Minute Oxygen Saturation % 95 % 91 %    3 Minute Liters of Oxygen 3 L 3 L    4 Minute Oxygen Saturation % 94 % 93 %    4 Minute Liters of Oxygen 3 L 3 L    5 Minute Oxygen Saturation % 92 % 92 %    5 Minute Liters of Oxygen 3 L 3 L    6 Minute Oxygen Saturation % 89 % 92 %    6 Minute Liters of Oxygen 3 L 3 L    2 Minute Post Oxygen Saturation %  - 97 %    2 Minute Post Liters of Oxygen 3 L 3 L       Quality of Life:     Quality of Life - 11/10/16 1055      Quality of Life Scores   Health/Function Pre 21 %   Health/Function Post 14.67 %   Health/Function % Change -30.14 %   Socioeconomic Pre 21 %   Socioeconomic Post 12.14 %   Socioeconomic % Change  -42.19 %   Psych/Spiritual Pre 21 %   Psych/Spiritual Post 18.86 %   Psych/Spiritual % Change -10.19 %   Family Pre 21 %   Family Post 16.1 %   Family % Change -23.33 %   GLOBAL Pre 21 %   GLOBAL Post 15.22 %   GLOBAL % Change -27.52 %      Personal Goals: Goals established at orientation with interventions provided to work toward goal.    Personal Goals Discharge:     Goals and Risk Factor Review - 09/17/16 1108      Core Components/Risk Factors/Patient Goals Review   Personal Goals Review  Weight Management/Obesity;Tobacco Cessation;Improve shortness of breath with ADL's   Review [name] [name]'s weight has been fairly steady.  She is somewhat better mentally.  She is has been doing better with her meds.  Her breathng is getting better at home and she is able to do more.  She also now has a home aide to help with getting things done around the house.  She continues to stay away from smoking.   Expected Outcomes Short: Continue to come to class regular to work on weight loss.  Long: Continue to work on risk factor modifications.       Exercise Goals and Review:   Nutrition & Weight - Outcomes:      Post Biometrics - 11/10/16 1059       Post  Biometrics   Height '5\' 3"'  (1.6 m)   Weight 145 lb (65.8 kg)   Waist Circumference 32.5 inches   Hip Circumference 42 inches   Waist to Hip Ratio 0.77 %   BMI (Calculated) 25.69      Nutrition:   Nutrition Discharge:   Education Questionnaire Score:     Knowledge Questionnaire Score - 11/10/16 1051      Knowledge Questionnaire Score   Pre Score 7/10   Post Score 10/10      Goals reviewed with patient; copy given to patient.

## 2016-11-21 NOTE — Progress Notes (Signed)
Pulmonary Individual Treatment Plan  Patient Details  Name: [name] [name] MRN: [id number]  Date of Birth: Feb 24, 1960 Referring Provider:    Initial Encounter Date:    Pulmonary Rehab from 01/22/2016 in [facility]  Date  01/22/16      Visit Diagnosis: COPD, severe ([place])  Patient's Home Medications on Admission:  Current Outpatient Prescriptions:  .  albuterol (PROAIR HFA) 108 (90 Base) MCG/ACT inhaler, INHALE 2 PUFFS BY MOUTH EVERY 6 HOURS AS NEEDED FOR WHEEZING OR SHORTNESS OF BREATH., Disp: 3 Inhaler, Rfl: 3 .  ALPRAZolam (XANAX) 1 MG tablet, TAKE 1/2 TABLET BY MOUTH THREE TIMES DAILY AS NEEDED FOR ANXIETY, Disp: 30 tablet, Rfl: 3 .  aspirin 81 MG EC tablet, Take 1 tablet (81 mg total) by mouth daily., Disp: 30 tablet, Rfl: 1 .  cetirizine (ZYRTEC ALLERGY) 10 MG tablet, Take 1 tablet (10 mg total) by mouth daily., Disp: 90 tablet, Rfl: 3 .  fluticasone (FLONASE) 50 MCG/ACT nasal spray, Place 1 spray into both nostrils daily., Disp: 1 g, Rfl: 5 .  guaiFENesin (MUCINEX) 600 MG 12 hr tablet, Take 1 tablet (600 mg total) by mouth 2 (two) times daily as needed for cough or to loosen phlegm., Disp: 20 tablet, Rfl: 0 .  ipratropium-albuterol (DUONEB) 0.5-2.5 (3) MG/3ML SOLN, USE 3 ML VIA NEBULIZER EVERY 4 HOURS AS NEEDED FOR WHEEZING OR SHORTNESS OF BREATH, Disp: 360 mL, Rfl: 5 .  levocetirizine (XYZAL) 5 MG tablet, Take 5 mg by mouth every evening., Disp: , Rfl:  .  levofloxacin (LEVAQUIN) 500 MG tablet, Take 1 tablet (500 mg total) by mouth daily. (Patient not taking: Reported on 07/22/2016), Disp: 7 tablet, Rfl: 0 .  mometasone-formoterol (DULERA) 200-5 MCG/ACT AERO, Inhale 2 puffs into the lungs 2 (two) times daily., Disp: 3 Inhaler, Rfl: 3 .  predniSONE (DELTASONE) 5 MG tablet, Take 1 tablet (5 mg total) by mouth daily with breakfast., Disp: 30 tablet, Rfl: 5 .  QUEtiapine (SEROQUEL) 50 MG tablet, Take 1 tablet (50 mg total) by mouth at bedtime., Disp: 30  tablet, Rfl: 0 .  tiotropium (SPIRIVA) 18 MCG inhalation capsule, Place 1 capsule (18 mcg total) into inhaler and inhale daily., Disp: 90 capsule, Rfl: 3  Past Medical History: Past Medical History:  Diagnosis Date  . Breast cancer ([place]) 2011   BILAT  lumpectomy 2011  . Cancer ([place])   . COPD (chronic obstructive pulmonary disease) ([facility])     Tobacco Use: History  Smoking Status  . Former Smoker  . Packs/day: 0.50  . Years: 40.00  Smokeless Tobacco  . Never Used    Comment: quit smoking 09/26/15    Labs: Recent Review Flowsheet Data    Labs for ITP Cardiac and Pulmonary Rehab Latest Ref Rng & Units 05/06/2015 05/08/2015 10/31/2015 10/31/2015 10/31/2015   Cholestrol 0 - 200 mg/dL - - - - -   LDLCALC 0 - 100 mg/dL - - - - -   HDL 40 - 60 mg/dL - - - - -   Trlycerides 0 - 200 mg/dL - - - - -   Hemoglobin A1c 4.8 - 5.6 % - - 5.0 - -   PHART 7.350 - 7.450 7.31(L) 7.36 - - -   PCO2ART 32.0 - 48.0 mmHg 57(H) 66(H) - - -   HCO3 20.0 - 28.0 mmol/L 28.7(H) 37.3(H) - 36.1(H) 35.9(H)   O2SAT % 96.3 96.3 - 98.2 97.4       Pulmonary Assessment Scores:  Pulmonary Assessment Scores    Row Name 07/01/16 1547 11/10/16 1051       ADL UCSD   ADL Phase Mid Exit    SOB Score total 92 87    Rest 0 2    Walk 3 3    Stairs 5 5    Bath 4 4    Dress 4 4    Shop 5 4      mMRC Score   mMRC Score  - 2       Pulmonary Function Assessment:   Exercise Target Goals:    Exercise Program Goal: Individual exercise prescription set with THRR, safety & activity barriers. Participant demonstrates ability to understand and report RPE using BORG scale, to self-measure pulse accurately, and to acknowledge the importance of the exercise prescription.  Exercise Prescription Goal: Starting with aerobic activity 30 plus minutes a day, 3 days per week for initial exercise prescription. Provide home exercise prescription and guidelines that participant acknowledges understanding prior to  discharge.  Activity Barriers & Risk Stratification:   6 Minute Walk:     6 Minute Walk    Row Name 06/11/16 1155 11/10/16 1055       6 Minute Walk   Phase Mid Program Discharge    Distance 1020 feet 1215 feet    Distance % Change 3 % 22.7 %    Distance Feet Change  - 225 ft    Walk Time 6 minutes 6 minutes    # of Rest Breaks 0 0    MPH 1.93 2.3    METS 3.3 4.03    RPE 13 11    Perceived Dyspnea  3 1    VO2 Peak 11.53 14.13    Symptoms No  -    Resting HR 103 bpm 110 bpm    Resting BP 118/68 138/70    Resting Oxygen Saturation   - 96 %    Exercise Oxygen Saturation  during 6 min walk  - 91 %    Max Ex. HR 120 bpm 136 bpm    Max Ex. BP 128/70 152/80    2 Minute Post BP  - 130/80      Interval HR   Baseline HR (retired) 103  -    1 Minute HR 112 110    2 Minute HR 115 121    3 Minute HR 118 127    4 Minute HR 119 128    5 Minute HR 120 122    6 Minute HR 120 136    2 Minute Post HR  - 117    Interval Heart Rate?  - Yes      Interval Oxygen   Interval Oxygen?  - Yes    Baseline Oxygen Saturation % 93 % 96 %    Resting Liters of Oxygen 3 L  -    1 Minute Oxygen Saturation % 95 % 93 %    1 Minute Liters of Oxygen 3 L 3 L    2 Minute Oxygen Saturation % 97 % 93 %    2 Minute Liters of Oxygen 3 L 3 L    3 Minute Oxygen Saturation % 95 % 91 %    3 Minute Liters of Oxygen 3 L 3 L    4 Minute Oxygen Saturation % 94 % 93 %    4 Minute Liters of Oxygen 3 L 3 L    5 Minute Oxygen Saturation % 92 % 92 %  5 Minute Liters of Oxygen 3 L 3 L    6 Minute Oxygen Saturation % 89 % 92 %    6 Minute Liters of Oxygen 3 L 3 L    2 Minute Post Oxygen Saturation %  - 97 %    2 Minute Post Liters of Oxygen 3 L 3 L      Oxygen Initial Assessment:   Oxygen Re-Evaluation:     Oxygen Re-Evaluation    Row Name 07/02/16 1555 08/27/16 1117 09/17/16 1122 11/17/16 1739       Program Oxygen Prescription   Program Oxygen Prescription Continuous;E-Tanks Continuous;E-Tanks  Continuous;E-Tanks Continuous;E-Tanks    Liters per minute _0 Comments Increased to 3l/m with some exercise goals  - Increased to 3l/m with some exercise goals Increased to 3LPM with exercise goals      Home Oxygen   Home Oxygen Device Portable Concentrator;Home Concentrator Portable Concentrator;Home Concentrator Portable Concentrator;Home Concentrator Portable Concentrator;Home Concentrator    Sleep Oxygen Prescription Continuous Continuous Continuous Continuous    Liters per minute _1 Home Exercise Oxygen Prescription Continuous Continuous Continuous Continuous    Liters per minute 2  Increased to 3 l/m as needed _2 Home at Rest Exercise Oxygen Prescription Continuous Continuous Continuous Continuous    Liters per minute _3 Compliance with Home Oxygen Use  - No No Yes    Comments  - -  portable concentrator is too heavy and wants to get a smaller one  -  -      Goals/Expected Outcomes   Short Term Goals  - To learn and exhibit compliance with exercise, home and travel O2 prescription;To Learn and understand importance of maintaining oxygen saturations>88%;To learn and demonstrate proper use of respiratory medications;To learn and understand importance of monitoring SPO2 with pulse oximeter and demonstrate accurate use of the pulse oximeter.;To learn and demonstrate proper purse lipped breathing techniques or other breathing techniques. To learn and exhibit compliance with exercise, home and travel O2 prescription;To Learn and understand importance of maintaining oxygen saturations>88%;To learn and demonstrate proper use of respiratory medications;To learn and understand importance of monitoring SPO2 with pulse oximeter and demonstrate accurate use of the pulse oximeter.;To learn and demonstrate proper purse lipped breathing techniques or other breathing techniques. To learn and exhibit compliance with exercise, home and travel O2 prescription;To learn and  understand importance of maintaining oxygen saturations>88%;To learn and demonstrate proper use of respiratory medications;To learn and demonstrate proper pursed lip breathing techniques or other breathing techniques.;To learn and understand importance of monitoring SPO2 with pulse oximeter and demonstrate accurate use of the pulse oximeter.    Long  Term Goals  - Exhibits compliance with exercise, home and travel O2 prescription;Maintenance of O2 saturations>88%;Compliance with respiratory medication;Demonstrates proper use of MDI's;Exhibits proper breathing techniques, such as purse lipped breathing or other method taught during program session;Verbalizes importance of monitoring SPO2 with pulse oximeter and return demonstration Exhibits compliance with exercise, home and travel O2 prescription;Maintenance of O2 saturations>88%;Compliance with respiratory medication;Demonstrates proper use of MDI's;Exhibits proper breathing techniques, such as purse lipped breathing or other method taught during program session;Verbalizes importance of monitoring SPO2 with pulse oximeter and return demonstration Verbalizes importance of monitoring SPO2 with pulse oximeter and return demonstration;Demonstrates proper use of MDI's;Exhibits proper breathing techniques, such as pursed lip breathing or other method taught during program session;Compliance with respiratory medication;Exhibits compliance with exercise, home  and travel O2 prescription;Maintenance of O2 saturations>88%    Comments Ms Loria is very independent with her oxygen. She uses 2 l/m, but she will increase her flow to 3l/m with activity. Pt is using her concentrator at home but not when she is going places. She states it is too heavy to carry around and is looking into getting a smaller.  [name] is staying compliant with her oxygen use.  She has not had any problems with her medications and staying on top of them.  She is using her PLB all the time and  finds it very helpful.   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  She has been taking her medications as prescribed.  She is using her PLB and has improved on the technique.    Goals/Expected Outcomes Continue to self-manage her oxygen with her COPD. Short term: To obtain a smaller concentrator. Long Term: continue to self manage her COPD Short: continue to think about a smaller concentrator.  Long: Continue to self manage COPD. Short: continue work on PLB. Long: Be independent with PLB       Oxygen Discharge (Final Oxygen Re-Evaluation):     Oxygen Re-Evaluation - 11/17/16 1739      Program Oxygen Prescription   Program Oxygen Prescription Continuous;E-Tanks   Liters per minute 2   Comments Increased to 3LPM with exercise goals     Home Oxygen   Home Oxygen Device Portable Concentrator;Home Concentrator   Sleep Oxygen Prescription Continuous   Liters per minute 2   Home Exercise Oxygen Prescription Continuous   Liters per minute 2   Home at Rest Exercise Oxygen Prescription Continuous   Liters per minute 2   Compliance with Home Oxygen Use Yes     Goals/Expected Outcomes   Short Term Goals To learn and exhibit compliance with exercise, home and travel O2 prescription;To learn and understand importance of maintaining oxygen saturations>88%;To learn and demonstrate proper use of respiratory medications;To learn and demonstrate proper pursed lip breathing techniques or other breathing techniques.;To learn and understand importance of monitoring SPO2 with pulse oximeter and demonstrate accurate use of the pulse oximeter.   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  She has been taking her medications as prescribed.  She is using her PLB and has improved on the technique.   Goals/Expected Outcomes Short: continue work on PLB. Long: Be independent with PLB      Initial Exercise Prescription:   Perform Capillary Blood Glucose checks as needed.  Exercise Prescription Changes:      Exercise Prescription Changes    Row Name 05/28/16 1400 06/11/16 1200 09/05/16 0900 09/17/16 1200 10/01/16 1200     Response to Exercise   Blood Pressure (Admit) 122/84 118/68 130/70 114/64 126/98   Blood Pressure (Exercise) 140/70 126/64 110/64  -  -   Blood Pressure (Exit) 112/66 126/72 130/70 112/60 126/74   Heart Rate (Admit) 89 bpm 103 bpm 91 bpm 101 bpm 114 bpm   Heart Rate (Exercise) 116 bpm 120 bpm 114 bpm 139 bpm 136 bpm   Heart Rate (Exit) 94 bpm 100 bpm  101 bpm 97 bpm 97 bpm   Oxygen Saturation (Admit) 93 % 93 % 96 % 94 % 94 %   Oxygen Saturation (Exercise) 96 % 89 % 96 % 91 % 91 %   Oxygen Saturation (Exit) 98 % 96 % 97 % 97 % 113 %   Rating of Perceived Exertion (Exercise) _0 -   Perceived Dyspnea (Exercise) _1 -   Duration Continue with 45 min of aerobic exercise without signs/symptoms of physical distress. Continue with 45 min of aerobic exercise without signs/symptoms of physical distress. Continue with 45 min of aerobic exercise without signs/symptoms of physical distress. Continue with 45 min of aerobic exercise without signs/symptoms of physical distress. Continue with 45 min of aerobic exercise without signs/symptoms of physical distress.   Intensity  - THRR unchanged THRR unchanged THRR unchanged THRR unchanged     Progression   Progression Continue to progress workloads to maintain intensity without signs/symptoms of physical distress. Continue to progress workloads  to maintain intensity without signs/symptoms of physical distress. Continue to progress workloads to maintain intensity without signs/symptoms of physical distress. Continue to progress workloads to maintain intensity without signs/symptoms of physical distress. Continue to progress workloads to maintain intensity without signs/symptoms of physical distress.   Average METs  -  - 2.5 2.5 2.7     Resistance Training   Training Prescription _2    Weight _3 Reps 10-15 10-15 10-15 10-15 10-15     Interval Training   Interval Training No No  -  -  -     Oxygen   Oxygen _4    Liters _5 Treadmill   MPH 1.3  - 1.3 1.6 2   Grade 0.5  - 1 1.5 0.5   Minutes 15  - _6 NuStep   Level  - 4 4  - 4   Minutes  - 15 15  - 15   METs  - 2.1 2.5  - 2.6     REL-XR   Level _7 Minutes _8 METs  - 2.2 2.7 2.4 2.8   Row Name 10/15/16 1200 10/29/16 1200 11/12/16 1100         Response to Exercise   Blood Pressure (Admit) 110/62 122/82 138/70     Blood Pressure (Exit) 104/60 116/68 120/60     Heart Rate (Admit) 105 bpm 54 bpm 125 bpm     Heart Rate (Exercise) 121 bpm 129 bpm 136 bpm     Heart Rate (Exit) 102 bpm 67 bpm 109 bpm     Oxygen Saturation (Admit) 95 % 98 % 95 %     Oxygen Saturation (Exercise) 95 % 96 % 91 %     Oxygen Saturation (Exit) 97 % 94 % 96 %     Duration Continue with 45 min of aerobic exercise without signs/symptoms of physical distress. Continue with 45 min of aerobic exercise without signs/symptoms of physical distress. Continue with 45 min of aerobic exercise without signs/symptoms of physical distress.     Intensity THRR unchanged THRR unchanged THRR unchanged       Progression   Progression Continue to progress workloads to maintain intensity without signs/symptoms of physical distress. Continue to progress workloads to maintain intensity without  signs/symptoms of  physical distress. Continue to progress workloads to maintain intensity without signs/symptoms of physical distress.     Average METs 2.4 2.5  -       Resistance Training   Training Prescription Yes Yes Yes     Weight _0 Reps 10-15 10-15 10-15       Oxygen   Oxygen Continuous Continuous Continuous     Liters _1 Treadmill   MPH 2 1.5  -     Grade 1.5 1.5  -     Minutes 15 15  -       NuStep   Level  - 4 5     Minutes  - 15 15     METs  - 2.6  -       REL-XR   Level _2 Minutes _3 METs 1.8 2.3  -        Exercise Comments:      Exercise Comments    Row Name 05/28/16 1412 06/11/16 1211         Exercise Comments [name] has tolerated exrecise well and has attended regularly during April. [name] is tolerating exercise well and has been consistent in her attendance in April.         Exercise Goals and Review:   Exercise Goals Re-Evaluation :     Exercise Goals Re-Evaluation    Row Name 08/27/16 1129 09/05/16 0926 09/17/16 1059 09/17/16 1214 10/01/16 1227     Exercise Goal Re-Evaluation   Exercise Goals Review Increase Physical Activity;Increase Strenth and Stamina Increase Physical Activity;Increase Strenth and Stamina Increase Physical Activity;Increase Strenth and Stamina Increase Physical Activity Increase Physical Activity;Increase Strength and Stamina;Able to understand and use rate of perceived exertion (RPE) scale;Able to understand and use Dyspnea scale   Comments patient is exercising at home more. She is doing weights and chair exercises that she has done in class. She has not had any walking at home due to the weather. [name] states she is able to attend more regularly at this time.  She has increased her overall MET level. [name] has been doing well in rehab. She missed a couple of days from being sick.  She enjoys coming to rehab and getting out of the house.  She has not been able to exercise as much as she  would like at home due to the humidity.  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 [name] has been able to attend class more often. [name] has shown good progress since attending regularly.  She has increased levels on all machines.   Expected Outcomes Short: To improve attendance to imrpove physical activity. Long: Continue to exercise at home and in class. Short - [name] will attend regularly.  Long - markie will complete the [employer] program. Short: Continue to attend class regularly.  Long: Start to exercise more at home and join [employer]. Short - [name] will achieve best results with regular attendance.  Long - [name] will continue to exercise on her own. Short - [name] will continue to attend regularly.  Long - [name] will continue to exercise on her own.   [place] Name 10/15/16 1238 10/29/16 1232 11/10/16 1100 11/12/16 1109       Exercise Goal Re-Evaluation   Exercise Goals Review Increase Physical Activity;Increase Strength and Stamina Increase Strength and Stamina Increase Physical Activity;Increase Strength and  Stamina;Knowledge and understanding of Target Heart Rate Range (THRR);Able to understand and use rate of perceived exertion (RPE) scale;Able to check pulse independently;Able to understand and use Dyspnea scale;Understanding of Exercise Prescription Increase Physical Activity;Increase Strength and Stamina;Able to understand and use rate of perceived exertion (RPE) scale;Understanding of Exercise Prescription;Able to understand and use Dyspnea scale    Comments [name] continues to tolerate exercise well.  She has 7 sessions of LW remaining. [name] is maintaining her levels on all machines.  Staff will monitor for increasing levels. [name] states she can now walk up two flights of stairs to get to her daughters house. She was not able to walk up that many stairs before. [name] has three session left to finish LW.  She has progressed well and improved 6 min walk distance.    Expected  Outcomes Short - [name] will complete LW.  Long - [name] will continue to exercise on her own. Short - markie will finish LW.  Long - [name] will maintain exercise on her own. Short: Continue exercise to improve further. Long: Maintain an exercise program post [employer] to further increase her walking distance. Short - [name] will finish LW.  Long - [name] will continue exercise in the [employer] class.        Discharge Exercise Prescription (Final Exercise Prescription Changes):     Exercise Prescription Changes - 11/12/16 1100      Response to Exercise   Blood Pressure (Admit) 138/70   Blood Pressure (Exit) 120/60   Heart Rate (Admit) 125 bpm   Heart Rate (Exercise) 136 bpm   Heart Rate (Exit) 109 bpm   Oxygen Saturation (Admit) 95 %   Oxygen Saturation (Exercise) 91 %   Oxygen Saturation (Exit) 96 %   Duration Continue with 45 min of aerobic exercise without signs/symptoms of physical distress.   Intensity THRR unchanged     Progression   Progression Continue to progress workloads to maintain intensity without signs/symptoms of physical distress.     Resistance Training   Training Prescription Yes   Weight 4   Reps 10-15     Oxygen   Oxygen Continuous   Liters 3     NuStep   Level 5   Minutes 15     REL-XR   Level 4   Minutes 15      Nutrition:  Target Goals: Understanding of nutrition guidelines, daily intake of sodium <1547m, cholesterol <209m calories 30% from fat and 7% or less from saturated fats, daily to have 5 or more servings of fruits and vegetables.  Biometrics:      Post Biometrics - 11/10/16 1059       Post  Biometrics   Height 5' 3" (1.6 m)   Weight 145 lb (65.8 kg)   Waist Circumference 32.5 inches   Hip Circumference 42 inches   Waist to Hip Ratio 0.77 %   BMI (Calculated) 25.69      Nutrition Therapy Plan and Nutrition Goals:   Nutrition Discharge: Rate Your Plate Scores:   Nutrition Goals Re-Evaluation:     Nutrition  Goals Re-Evaluation    Row Name 08/27/16 1130 09/17/16 1114           Goals   Nutrition Goal Continue to maintain her weight loss with improved eating habits Meet with nutirtionist       Comment she has lost weight, eating better and not eating as much MaBonnita Hollowas an appointment to meet with dietician on 9/4.  Expected Outcome Short: Mainatin weight loss. Long: Continue health eating Short: Meet with nutrtitionist  Long: Work on weight loss eating.         Nutrition Goals Discharge (Final Nutrition Goals Re-Evaluation):     Nutrition Goals Re-Evaluation - 09/17/16 1114      Goals   Nutrition Goal Meet with nutirtionist    Comment [name] has an appointment to meet with dietician on 9/4.   Expected Outcome Short: Meet with nutrtitionist  Long: Work on weight loss eating.      Psychosocial: Target Goals: Acknowledge presence or absence of significant depression and/or stress, maximize coping skills, provide positive support system. Participant is able to verbalize types and ability to use techniques and skills needed for reducing stress and depression.   Initial Review & Psychosocial Screening:   Quality of Life Scores:     Quality of Life - 11/10/16 1055      Quality of Life Scores   Health/Function Pre 21 %   Health/Function Post 14.67 %   Health/Function % Change -30.14 %   Socioeconomic Pre 21 %   Socioeconomic Post 12.14 %   Socioeconomic % Change  -42.19 %   Psych/Spiritual Pre 21 %   Psych/Spiritual Post 18.86 %   Psych/Spiritual % Change -10.19 %   Family Pre 21 %   Family Post 16.1 %   Family % Change -23.33 %   GLOBAL Pre 21 %   GLOBAL Post 15.22 %   GLOBAL % Change -27.52 %      PHQ-9: Recent Review Flowsheet Data    Depression screen [facility] 2/9 11/10/2016 01/22/2016   Decreased Interest 1 2   Down, Depressed, Hopeless 2 2   PHQ - 2 Score 3 4   Altered sleeping 1 3   Tired, decreased energy 2 3   Change in appetite 2 0   Feeling bad or failure  about yourself  2 3   Trouble concentrating 1 1   Moving slowly or fidgety/restless 0 1   Suicidal thoughts 0 0   PHQ-9 Score 11 15   Difficult doing work/chores Somewhat difficult Very difficult     Interpretation of Total Score  Total Score Depression Severity:  1-4 = Minimal depression, 5-9 = Mild depression, 10-14 = Moderate depression, 15-19 = Moderately severe depression, 20-27 = Severe depression   Psychosocial Evaluation and Intervention:   Psychosocial Re-Evaluation:     Psychosocial Re-Evaluation    Row Name 06/04/16 1118 07/02/16 1603 08/27/16 1132 08/27/16 1148 09/10/16 1112     Psychosocial Re-Evaluation   Current issues with Current Stress Concerns  - Current Depression;Current Stress Concerns  -  -   Comments Markie came in today looking a little down.  Her granddaughter is back in the hosiptal as the [place] trip was too much for her.  She said that her granddaughter continues to fight and is filled with the [place].  Her and [name] have a very special bond.  [name] is trying to be strong for her daughter and granddaughter.  She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

## 2016-11-21 NOTE — Progress Notes (Signed)
Discharge Progress Report  Patient Details  Name: [name] [name] MRN: [id number] Date of Birth: February 07, 1960 Referring Provider:     Number of Visits: 36/36  Reason for Discharge:  Patient reached a stable level of exercise. Patient independent in their exercise. Patient has met program and personal goals.  Smoking History:  History  Smoking Status  . Former Smoker  . Packs/day: 0.50  . Years: 40.00  Smokeless Tobacco  . Never Used    Comment: quit smoking 09/26/15    Diagnosis:  COPD, severe (HCC)  ADL UCSD:     Pulmonary Assessment Scores    Row Name 07/01/16 1547 11/10/16 1051       ADL UCSD   ADL Phase Mid Exit    SOB Score total 92 87    Rest 0 2    Walk 3 3    Stairs 5 5    Bath 4 4    Dress 4 4    Shop 5 4      mMRC Score   mMRC Score  - 2       Initial Exercise Prescription:   Discharge Exercise Prescription (Final Exercise Prescription Changes):     Exercise Prescription Changes - 11/12/16 1100      Response to Exercise   Blood Pressure (Admit) 138/70   Blood Pressure (Exit) 120/60   Heart Rate (Admit) 125 bpm   Heart Rate (Exercise) 136 bpm   Heart Rate (Exit) 109 bpm   Oxygen Saturation (Admit) 95 %   Oxygen Saturation (Exercise) 91 %   Oxygen Saturation (Exit) 96 %   Duration Continue with 45 min of aerobic exercise without signs/symptoms of physical distress.   Intensity THRR unchanged     Progression   Progression Continue to progress workloads to maintain intensity without signs/symptoms of physical distress.     Resistance Training   Training Prescription Yes   Weight 4   Reps 10-15     Oxygen   Oxygen Continuous   Liters 3     NuStep   Level 5   Minutes 15     REL-XR   Level 4   Minutes 15      Functional Capacity:     6 Minute Walk    Row Name 06/11/16 1155 11/10/16 1055       6 Minute Walk   Phase Mid Program Discharge    Distance 1020 feet 1215 feet    Distance % Change 3 % 22.7 %    Distance  Feet Change  - 225 ft    Walk Time 6 minutes 6 minutes    # of Rest Breaks 0 0    MPH 1.93 2.3    METS 3.3 4.03    RPE 13 11    Perceived Dyspnea  3 1    VO2 Peak 11.53 14.13    Symptoms No  -    Resting HR 103 bpm 110 bpm    Resting BP 118/68 138/70    Resting Oxygen Saturation   - 96 %    Exercise Oxygen Saturation  during 6 min walk  - 91 %    Max Ex. HR 120 bpm 136 bpm    Max Ex. BP 128/70 152/80    2 Minute Post BP  - 130/80      Interval HR   Baseline HR (retired) 103  -    1 Minute HR 112 110    2 Minute HR 115 121  3 Minute HR 118 127    4 Minute HR 119 128    5 Minute HR 120 122    6 Minute HR 120 136    2 Minute Post HR  - 117    Interval Heart Rate?  - Yes      Interval Oxygen   Interval Oxygen?  - Yes    Baseline Oxygen Saturation % 93 % 96 %    Resting Liters of Oxygen 3 L  -    1 Minute Oxygen Saturation % 95 % 93 %    1 Minute Liters of Oxygen 3 L 3 L    2 Minute Oxygen Saturation % 97 % 93 %    2 Minute Liters of Oxygen 3 L 3 L    3 Minute Oxygen Saturation % 95 % 91 %    3 Minute Liters of Oxygen 3 L 3 L    4 Minute Oxygen Saturation % 94 % 93 %    4 Minute Liters of Oxygen 3 L 3 L    5 Minute Oxygen Saturation % 92 % 92 %    5 Minute Liters of Oxygen 3 L 3 L    6 Minute Oxygen Saturation % 89 % 92 %    6 Minute Liters of Oxygen 3 L 3 L    2 Minute Post Oxygen Saturation %  - 97 %    2 Minute Post Liters of Oxygen 3 L 3 L       Psychological, QOL, Others - Outcomes: PHQ 2/9: Depression screen [facility] 2/9 11/10/2016 01/22/2016  Decreased Interest 1 2  Down, Depressed, Hopeless 2 2  PHQ - 2 Score 3 4  Altered sleeping 1 3  Tired, decreased energy 2 3  Change in appetite 2 0  Feeling bad or failure about yourself  2 3  Trouble concentrating 1 1  Moving slowly or fidgety/restless 0 1  Suicidal thoughts 0 0  PHQ-9 Score 11 15  Difficult doing work/chores Somewhat difficult Very difficult    Quality of Life:     Quality of Life - 11/10/16  1055      Quality of Life Scores   Health/Function Pre 21 %   Health/Function Post 14.67 %   Health/Function % Change -30.14 %   Socioeconomic Pre 21 %   Socioeconomic Post 12.14 %   Socioeconomic % Change  -42.19 %   Psych/Spiritual Pre 21 %   Psych/Spiritual Post 18.86 %   Psych/Spiritual % Change -10.19 %   Family Pre 21 %   Family Post 16.1 %   Family % Change -23.33 %   GLOBAL Pre 21 %   GLOBAL Post 15.22 %   GLOBAL % Change -27.52 %      Personal Goals: Goals established at orientation with interventions provided to work toward goal.    Personal Goals Discharge:     Goals and Risk Factor Review    Row Name 07/02/16 1559 08/27/16 1123 09/17/16 1108         Core Components/Risk Factors/Patient Goals Review   Personal Goals Review  - Weight Management/Obesity;Tobacco Cessation;Improve shortness of breath with ADL's Weight Management/Obesity;Tobacco Cessation;Improve shortness of breath with ADL's     Review [name] [name] has only been able to attend 3 sessions in this 30 day period. Her granddaughter is very ill with cancer, and [name] [name] has missed several sessions due to her own health.  [name] [name] has lost weight since she was out of the  program. Her weight loss could have been from her dreppresive state. She is eating better and exercising more currently. [name] [name] is not taking as much steroid medication which help curb her appitite. [name] [name] states she has gone 4 to 5 months without smoking. Shotness of breath has got better and is able to do more at home. The weather really affects her breathing and she does not go outside when it is hot and humid. [name] [name] weight has been fairly steady.  She is somewhat better mentally.  She is has been doing better with her meds.  Her breathng is getting better at home and she is able to do more.  She also now has a home aide to help with getting things done around the house.  She continues to stay away from smoking.     Expected Outcomes  Continue in [place] with regular attendence. Short: improve her attendance in rehab. Long Term: Go two more months without smoking. Short: Continue to come to class regular to work on weight loss.  Long: Continue to work on risk factor modifications.         Exercise Goals and Review:   Nutrition & Weight - Outcomes:      Post Biometrics - 11/10/16 1059       Post  Biometrics   Height '5\' 3"'  (1.6 m)   Weight 145 lb (65.8 kg)   Waist Circumference 32.5 inches   Hip Circumference 42 inches   Waist to Hip Ratio 0.77 %   BMI (Calculated) 25.69      Nutrition:   Nutrition Discharge:   Education Questionnaire Score:     Knowledge Questionnaire Score - 11/10/16 1051      Knowledge Questionnaire Score   Pre Score 7/10   Post Score 10/10      Goals reviewed with patient; copy given to patient.

## 2016-12-30 ENCOUNTER — Other Ambulatory Visit: Payer: Self-pay | Admitting: Internal Medicine

## 2016-12-30 DIAGNOSIS — F411 Generalized anxiety disorder: Secondary | ICD-10-CM

## 2017-01-06 ENCOUNTER — Ambulatory Visit (INDEPENDENT_AMBULATORY_CARE_PROVIDER_SITE_OTHER): Payer: Medicare Other | Admitting: Internal Medicine

## 2017-01-06 ENCOUNTER — Encounter: Payer: Self-pay | Admitting: Internal Medicine

## 2017-01-06 ENCOUNTER — Other Ambulatory Visit: Payer: Self-pay | Admitting: Internal Medicine

## 2017-01-06 VITALS — BP 128/90 | HR 104 | Ht 63.0 in | Wt 152.0 lb

## 2017-01-06 DIAGNOSIS — F411 Generalized anxiety disorder: Secondary | ICD-10-CM | POA: Diagnosis not present

## 2017-01-06 DIAGNOSIS — J449 Chronic obstructive pulmonary disease, unspecified: Secondary | ICD-10-CM

## 2017-01-06 MED ORDER — TIOTROPIUM BROMIDE MONOHYDRATE 18 MCG IN CAPS: 18.0000 ug | ORAL_CAPSULE | Freq: Every day | RESPIRATORY_TRACT | 3 refills | Status: DC

## 2017-01-06 MED ORDER — IPRATROPIUM-ALBUTEROL 0.5-2.5 (3) MG/3ML IN SOLN: RESPIRATORY_TRACT | 5 refills | Status: DC

## 2017-01-06 MED ORDER — MOMETASONE FURO-FORMOTEROL FUM 200-5 MCG/ACT IN AERO: 2.0000 | INHALATION_SPRAY | Freq: Two times a day (BID) | RESPIRATORY_TRACT | 3 refills | Status: DC

## 2017-01-06 MED ORDER — CETIRIZINE HCL 10 MG PO TABS: 10.0000 mg | ORAL_TABLET | Freq: Every day | ORAL | 3 refills | Status: DC

## 2017-01-06 MED ORDER — GUAIFENESIN ER 600 MG PO TB12: 600.0000 mg | ORAL_TABLET | Freq: Two times a day (BID) | ORAL | 0 refills | Status: AC | PRN

## 2017-01-06 MED ORDER — PREDNISONE 5 MG PO TABS: 5.0000 mg | ORAL_TABLET | Freq: Every day | ORAL | 5 refills | Status: DC

## 2017-01-06 MED ORDER — FLUTICASONE PROPIONATE 50 MCG/ACT NA SUSP: 1.0000 | Freq: Every day | NASAL | 5 refills | Status: AC

## 2017-01-06 MED ORDER — ALPRAZOLAM 1 MG PO TABS: ORAL_TABLET | ORAL | 0 refills | Status: DC

## 2017-01-06 MED ORDER — LEVOFLOXACIN 500 MG PO TABS: 500.0000 mg | ORAL_TABLET | Freq: Every day | ORAL | 0 refills | Status: AC

## 2017-01-06 MED ORDER — PREDNISONE 20 MG PO TABS: 20.0000 mg | ORAL_TABLET | Freq: Every day | ORAL | 5 refills | Status: DC

## 2017-01-06 MED ORDER — ALBUTEROL SULFATE HFA 108 (90 BASE) MCG/ACT IN AERS: INHALATION_SPRAY | RESPIRATORY_TRACT | 3 refills | Status: DC

## 2017-01-06 NOTE — Progress Notes (Signed)
[facility] Consultation     Date: 01/06/2017,   MRN# [id number] [name] [name] [birth year] Code Status:  Code Status History    Date Active Date Inactive Code Status Order ID Comments User Context   05/06/2015  5:11 PM 05/14/2015  5:49 PM Full Code [id number]  Wilhelmina Mcardle, MD ED   12/08/2014  9:33 PM 12/10/2014  3:11 PM Full Code [id number]  Theodoro Grist, MD Inpatient   07/15/2014  9:24 PM 07/17/2014  2:06 PM Full Code [id number]  Idelle Crouch, MD Inpatient                    CurrentWeight: 152 lb (68.9 kg) [name] [name] is a 56 y.o. old female seen in consultation for copd      CHIEF COMPLAINT:   Follow up COPD  HISTORY OF PRESENT ILLNESS   Follow up for COPD  Patient on biPAP, on oxygen with exertion and at night-patient benefits from this therapy and is preventing recurrent admissions   Patient remains SOB, DOE seems to be at her baseline She did NOT tolerate azithromycin and has restarted oral prednisonev now on 5 mg daily On dulera and spiriva and albuterol as needed No sign of infection at this time  PFT 06/2015 Ratio 48% Fev1 28% DLCO 37%  6MWT abnormal o2 sat below 87%   Her current pulmonary regimen is as follows: Dulera 200-5, 2 actuations BID Spiriva HH - once a day Prednisone 5  mg daily Albuterol MDI PRN Duoneb PRN BiPAP @ HS   Stopped smoking 10 months ago  +productive cough Increased WHeezing +mild COPD exacerbation Current Medication:   Current Outpatient Medications:  .  albuterol (PROAIR HFA) 108 (90 Base) MCG/ACT inhaler, INHALE 2 PUFFS BY MOUTH EVERY 6 HOURS AS NEEDED FOR WHEEZING OR SHORTNESS OF BREATH., Disp: 3 Inhaler, Rfl: 3 .  ALPRAZolam (XANAX) 1 MG tablet, TAKE 1/2 TABLET BY MOUTH THREE TIMES DAILY AS NEEDED FOR ANXIETY, Disp: 30 tablet, Rfl: 0 .  aspirin 81 MG EC tablet, Take 1 tablet (81 mg total) by mouth daily., Disp: 30 tablet, Rfl: 1 .  cetirizine (ZYRTEC ALLERGY) 10 MG tablet, Take 1 tablet  (10 mg total) by mouth daily., Disp: 90 tablet, Rfl: 3 .  fluticasone (FLONASE) 50 MCG/ACT nasal spray, Place 1 spray into both nostrils daily., Disp: 1 g, Rfl: 5 .  guaiFENesin (MUCINEX) 600 MG 12 hr tablet, Take 1 tablet (600 mg total) by mouth 2 (two) times daily as needed for cough or to loosen phlegm., Disp: 20 tablet, Rfl: 0 .  ipratropium-albuterol (DUONEB) 0.5-2.5 (3) MG/3ML SOLN, USE 3 ML VIA NEBULIZER EVERY 4 HOURS AS NEEDED FOR WHEEZING OR SHORTNESS OF BREATH, Disp: 360 mL, Rfl: 5 .  levocetirizine (XYZAL) 5 MG tablet, Take 5 mg by mouth every evening., Disp: , Rfl:  .  mometasone-formoterol (DULERA) 200-5 MCG/ACT AERO, Inhale 2 puffs into the lungs 2 (two) times daily., Disp: 3 Inhaler, Rfl: 3 .  predniSONE (DELTASONE) 5 MG tablet, Take 1 tablet (5 mg total) by mouth daily with breakfast., Disp: 30 tablet, Rfl: 5 .  QUEtiapine (SEROQUEL) 50 MG tablet, Take 1 tablet (50 mg total) by mouth at bedtime., Disp: 30 tablet, Rfl: 0 .  tiotropium (SPIRIVA) 18 MCG inhalation capsule, Place 1 capsule (18 mcg total) into inhaler and inhale daily., Disp: 90 capsule, Rfl: 3     ALLERGIES   Zithromax [azithromycin] and Penicillins     REVIEW OF SYSTEMS  Review of Systems  Constitutional: Negative for chills, fever, malaise/fatigue and weight loss.  HENT: Negative for congestion.   Respiratory: Positive for shortness of breath. Negative for cough, hemoptysis, sputum production and wheezing.   Cardiovascular: Negative for chest pain, palpitations, orthopnea and leg swelling.  Gastrointestinal: Negative for heartburn, nausea and vomiting.  Psychiatric/Behavioral: Negative for depression. The patient is not nervous/anxious.   All other systems reviewed and are negative.   BP 128/90 (BP Location: Left Arm, Cuff Size: Normal)   Pulse (!) 104   Ht 5\' 3"  (1.6 m)   Wt 152 lb (68.9 kg)   SpO2 98%   BMI 26.93 kg/m    PHYSICAL EXAM   Physical Exam  Constitutional: She is oriented to  person, place, and time. No distress.  Cardiovascular: Normal rate, regular rhythm and normal heart sounds.  No murmur heard. Pulmonary/Chest: Effort normal and breath sounds normal. No stridor. No respiratory distress. She has no wheezes. She has no rales.  Musculoskeletal: Normal range of motion. She exhibits no edema.  Neurological: She is alert and oriented to person, place, and time.  Skin: Skin is warm. She is not diaphoretic.  Psychiatric: She has a normal mood and affect.       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  Patient needs/requires noninvasive ventilation to survive  Patient now has mild acute COPD exacerbation  1.biPAP at night with oxygen therapy 2 L [place] also oxygen with exertion -100% compliance AHI 0.2, no major leaks 2.prednisone 5 mg daily prevent COPD exacerbations 3.continue dulera and spiriva 4.continue albuterol as needed needed 5.she has stopped smoking  6.treatment for allergic rhinitis-flonase and allegra  7.continue lung worx rehab program 8.levaquin 500 mg daily for 7 days, increase prednisone does if needed(patient is very mindful of her prednisone dosing)  Follow up in 6 months  Patient are satisfied with Plan of action and management. All questions answered   [name], M.D.  [facility]  Medical Director [place] Director [facility] Cardio-Pulmonary Department

## 2017-01-06 NOTE — Patient Instructions (Addendum)
1.biPAP at night with oxygen therapy 2 L [place] also oxygen with exertion -100% compliance AHI 0.2, no major leaks 2.prednisone 5 mg daily prevent COPD exacerbations 3.continue dulera and spiriva 4.continue albuterol as needed needed

## 2017-02-02 ENCOUNTER — Other Ambulatory Visit: Payer: Self-pay | Admitting: Internal Medicine

## 2017-02-07 ENCOUNTER — Other Ambulatory Visit: Payer: Self-pay | Admitting: Pulmonary Disease

## 2017-02-11 ENCOUNTER — Other Ambulatory Visit: Payer: Self-pay | Admitting: Internal Medicine

## 2017-02-11 DIAGNOSIS — F411 Generalized anxiety disorder: Secondary | ICD-10-CM

## 2017-02-12 IMAGING — US US EXTREM LOW VENOUS BILAT
1 series · 13 of 24 positions shown · non-contrast
Comparison: None.

CLINICAL DATA: Lower extremity pain and edema for 1 month.



[Series 1: us extrem low venous bilat · 0.07mm/px · 13 of 62 slices shown]
[im 1/62]
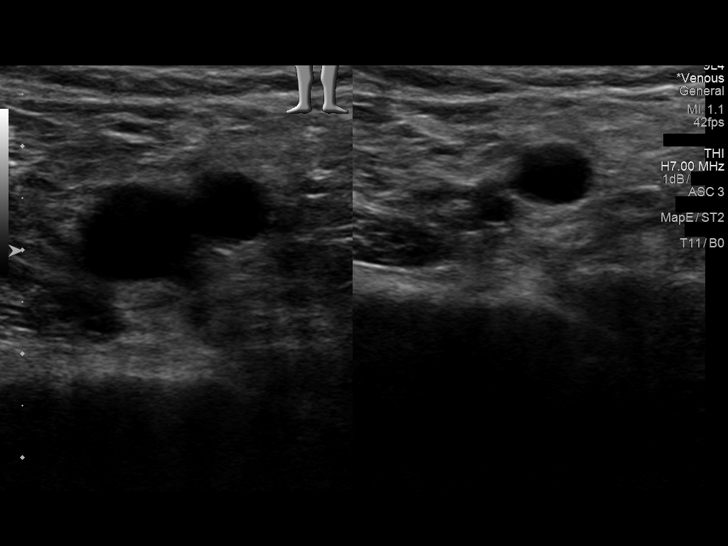
[im 6/62]
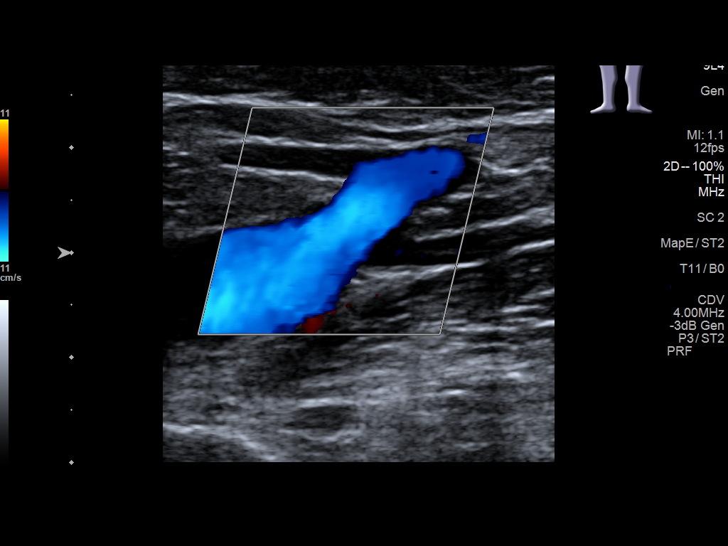
[im 11/62]
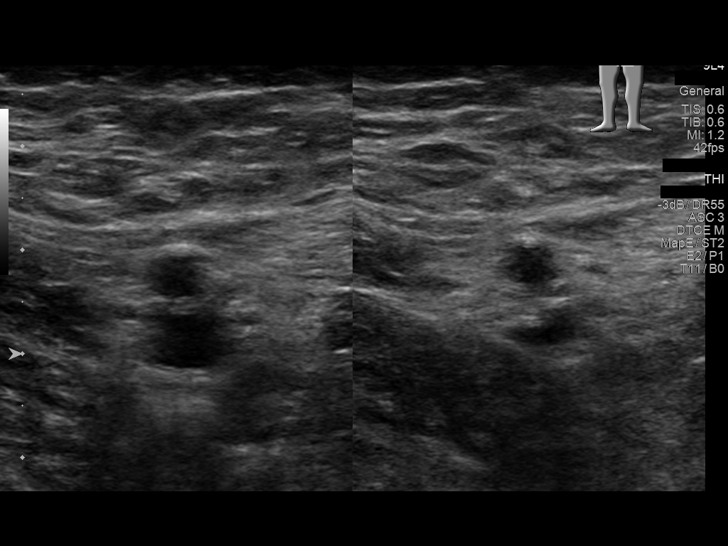
[im 16/62]
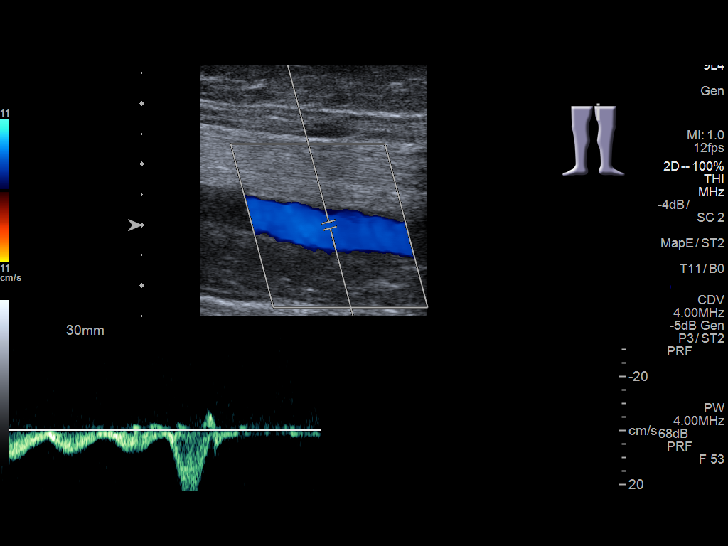
[im 22/62]
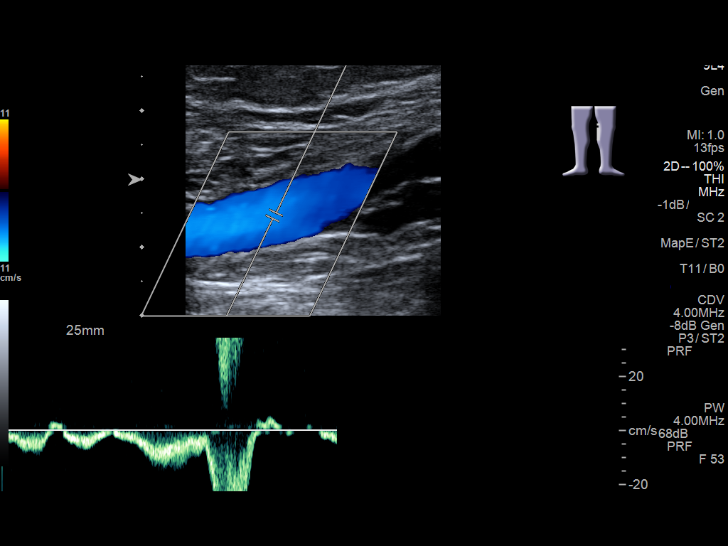
[im 27/62]
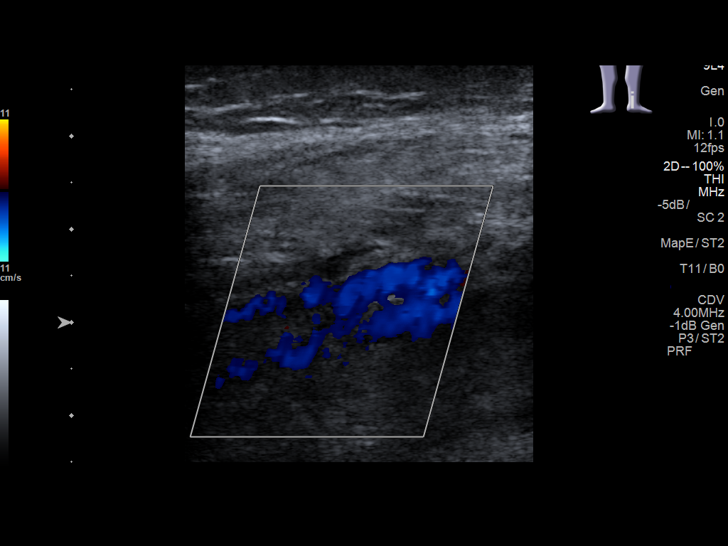
[im 32/62]
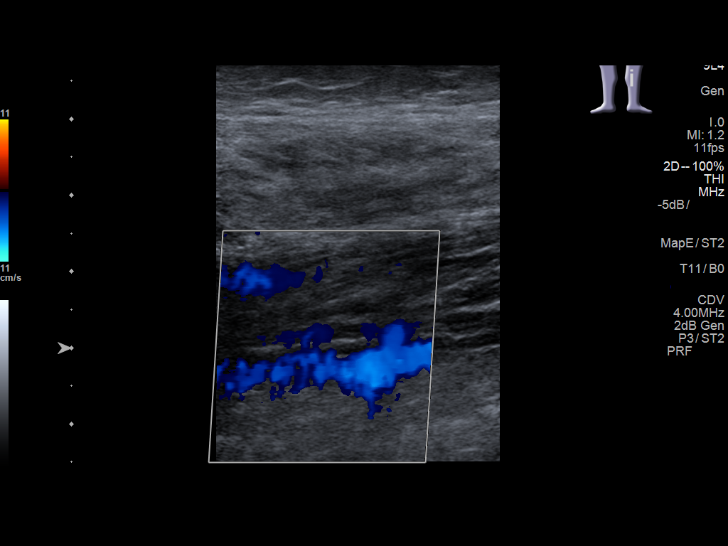
[im 35/62]
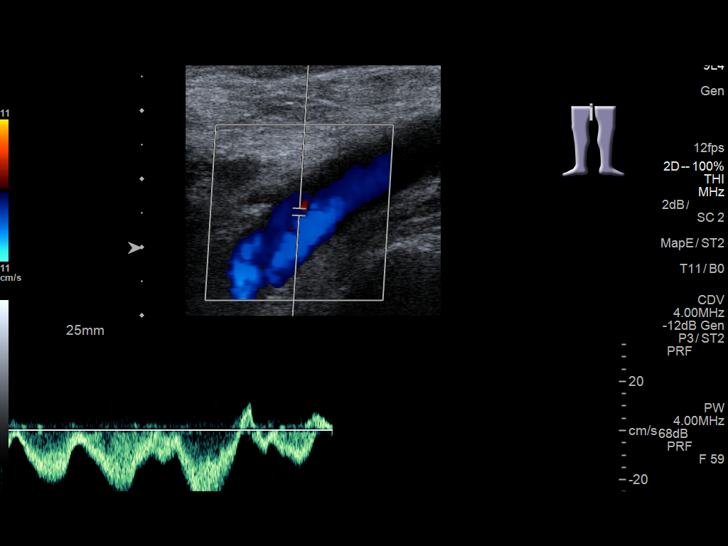
[im 40/62]
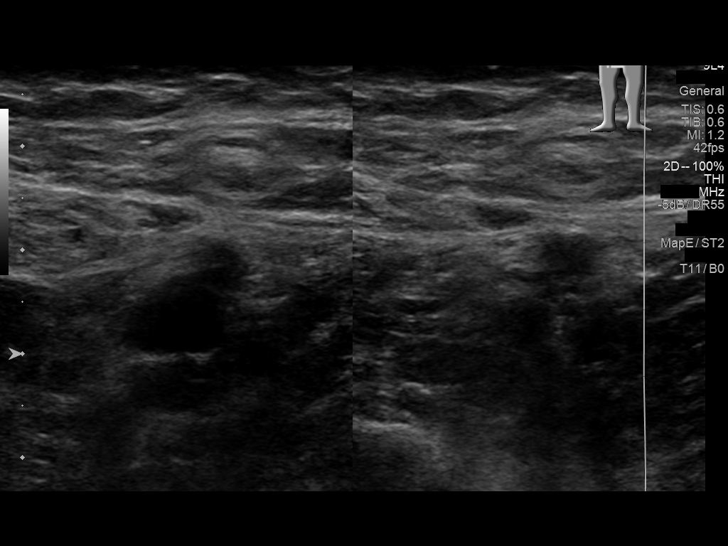
[im 46/62]
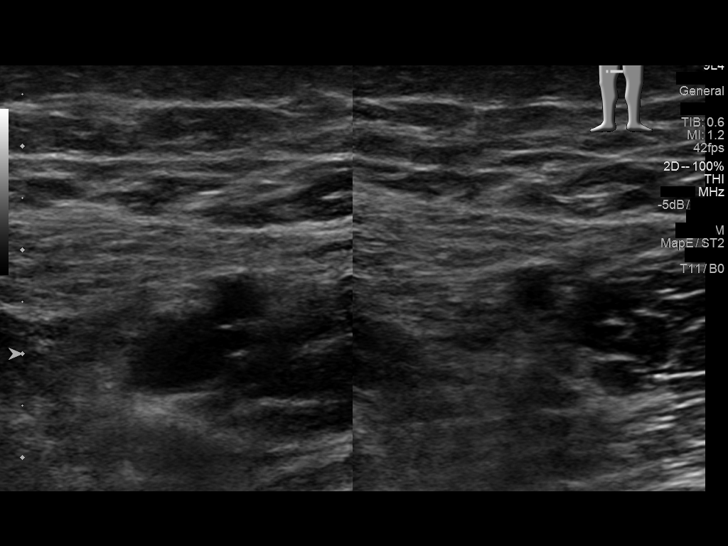
[im 51/62]
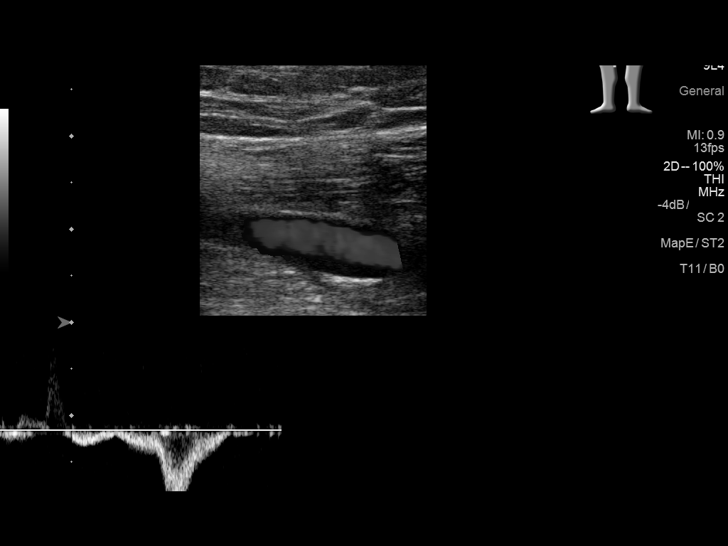
[im 56/62]
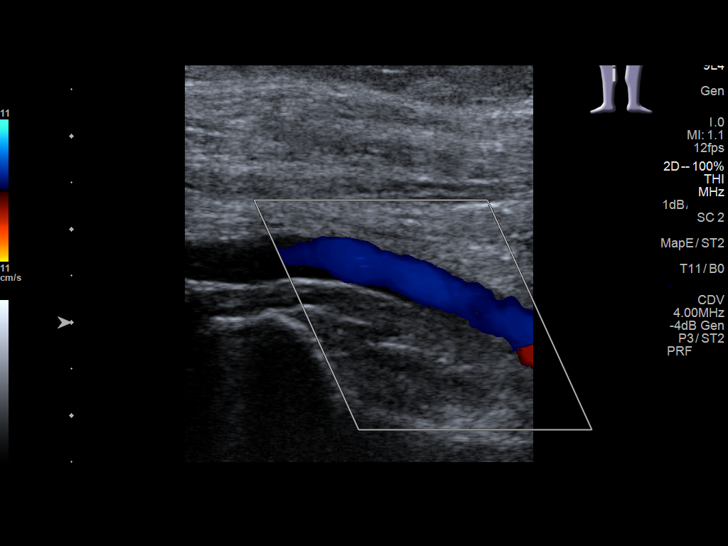
[im 62/62]
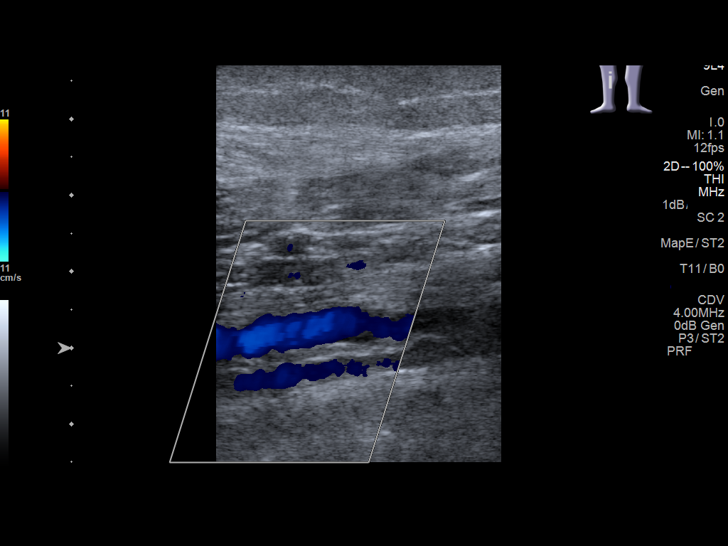

[13 of 24 positions shown; findings below may reference images not displayed]

FINDINGS: RIGHT LOWER EXTREMITY

Common Femoral Vein: No evidence of thrombus. Normal
compressibility, respiratory phasicity and response to augmentation.

Saphenofemoral Junction: No evidence of thrombus. Normal
compressibility and flow on color Doppler imaging.

Profunda Femoral Vein: No evidence of thrombus. Normal
compressibility and flow on color Doppler imaging.

Femoral Vein: No evidence of thrombus. Normal compressibility,
respiratory phasicity and response to augmentation.

Popliteal Vein: No evidence of thrombus. Normal compressibility,
respiratory phasicity and response to augmentation.

Calf Veins: No evidence of thrombus. Normal compressibility and flow
on color Doppler imaging.

Superficial Great Saphenous Vein: No evidence of thrombus. Normal
compressibility and flow on color Doppler imaging.

Venous Reflux:  None.

Other Findings:  None.

LEFT LOWER EXTREMITY

Common Femoral Vein: No evidence of thrombus. Normal
compressibility, respiratory phasicity and response to augmentation.

Saphenofemoral Junction: No evidence of thrombus. Normal
compressibility and flow on color Doppler imaging.

Profunda Femoral Vein: No evidence of thrombus. Normal
compressibility and flow on color Doppler imaging.

Femoral Vein: No evidence of thrombus. Normal compressibility,
respiratory phasicity and response to augmentation.

Popliteal Vein: No evidence of thrombus. Normal compressibility,
respiratory phasicity and response to augmentation.

Calf Veins: No evidence of thrombus. Normal compressibility and flow
on color Doppler imaging.

Superficial Great Saphenous Vein: No evidence of thrombus. Normal
compressibility and flow on color Doppler imaging.

Venous Reflux:  None.

Other Findings:  None.
IMPRESSION: No evidence of deep venous thrombosis.

## 2017-02-15 ENCOUNTER — Other Ambulatory Visit: Payer: Self-pay | Admitting: Internal Medicine

## 2017-02-15 DIAGNOSIS — F411 Generalized anxiety disorder: Secondary | ICD-10-CM

## 2017-02-15 IMAGING — CR DG CHEST 2V
2 series · 2 of 2 positions shown · non-contrast
Comparison: 11/28/2015

CLINICAL DATA: Shortness of Breath several days

EXAM:
CHEST  2 VIEW

[chest pa]
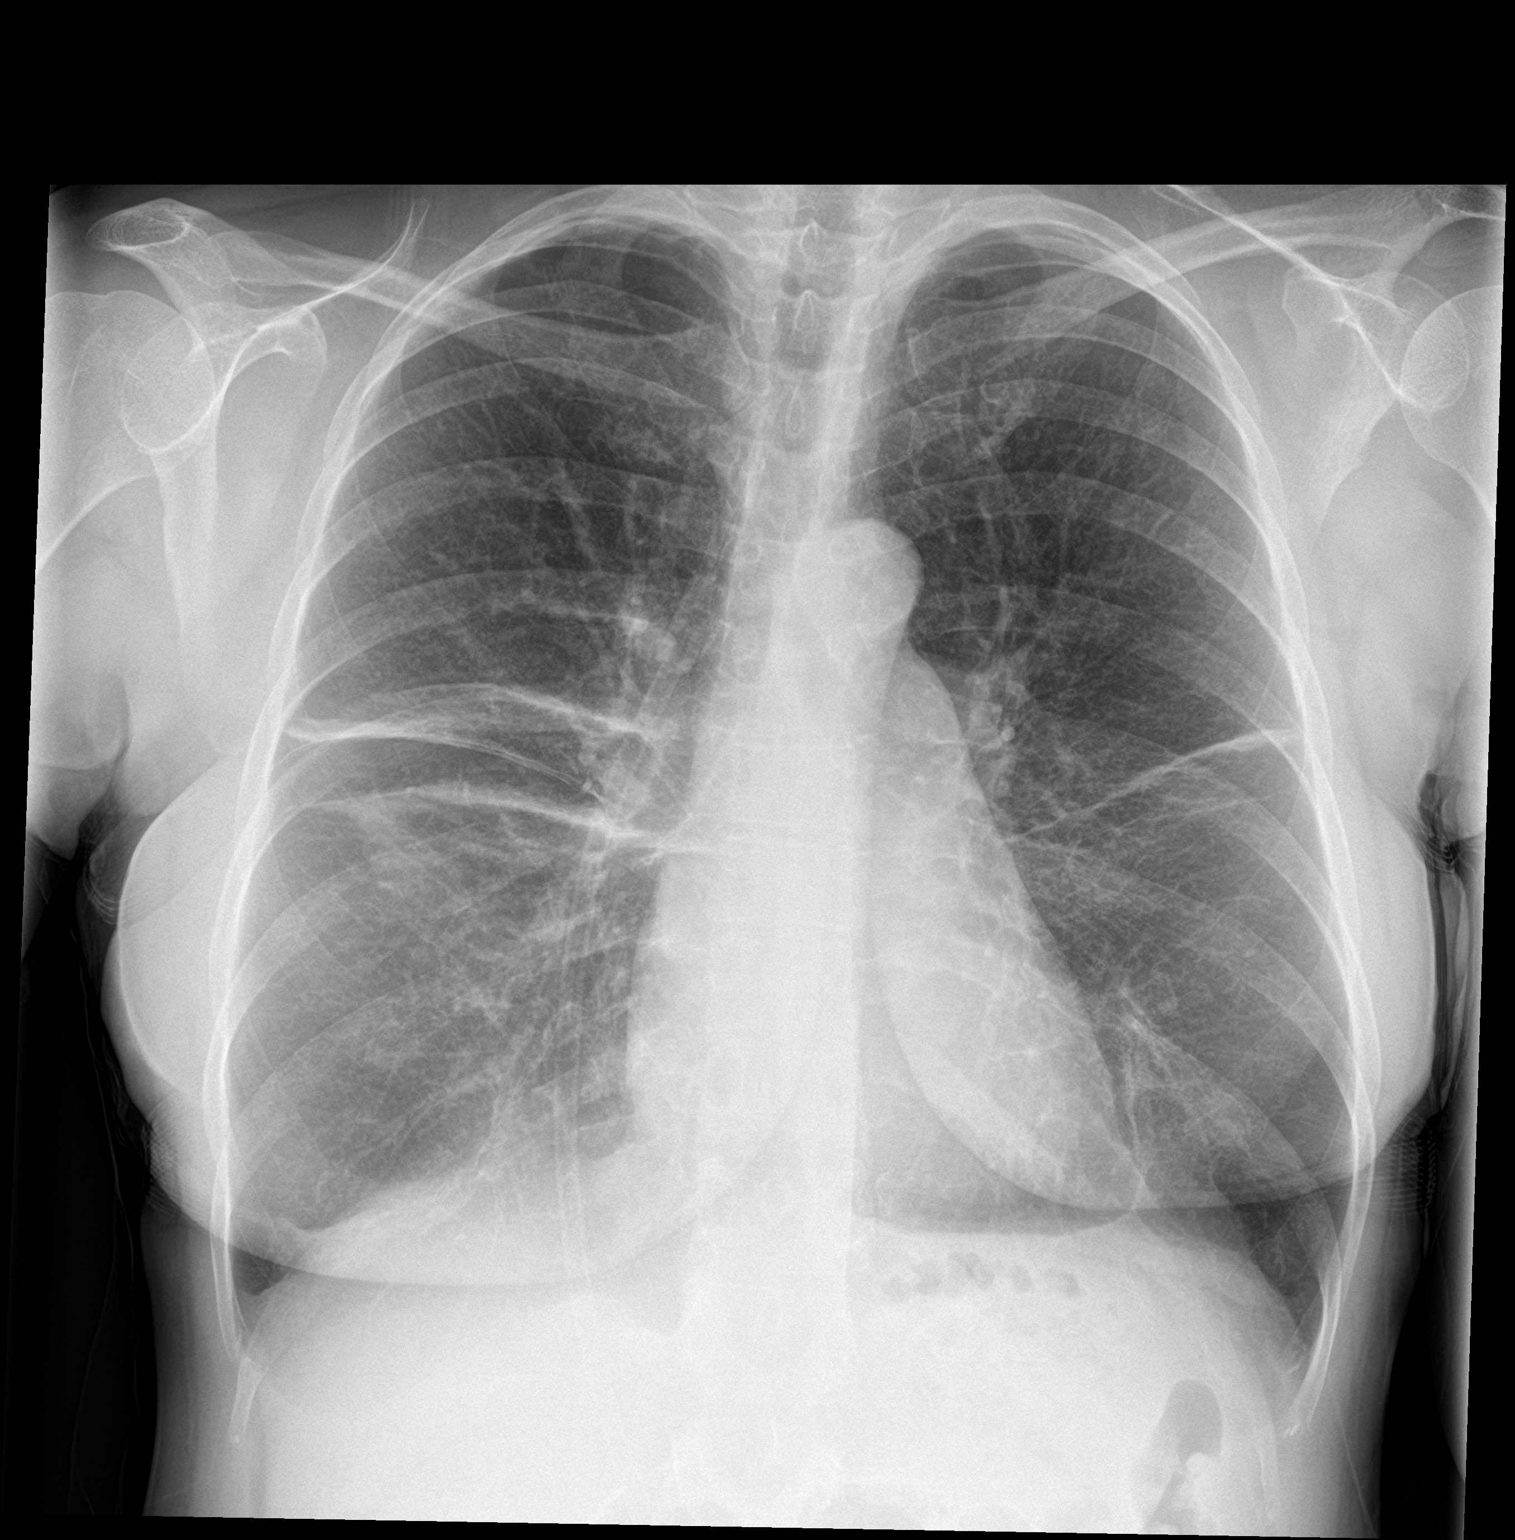

[chest lat]
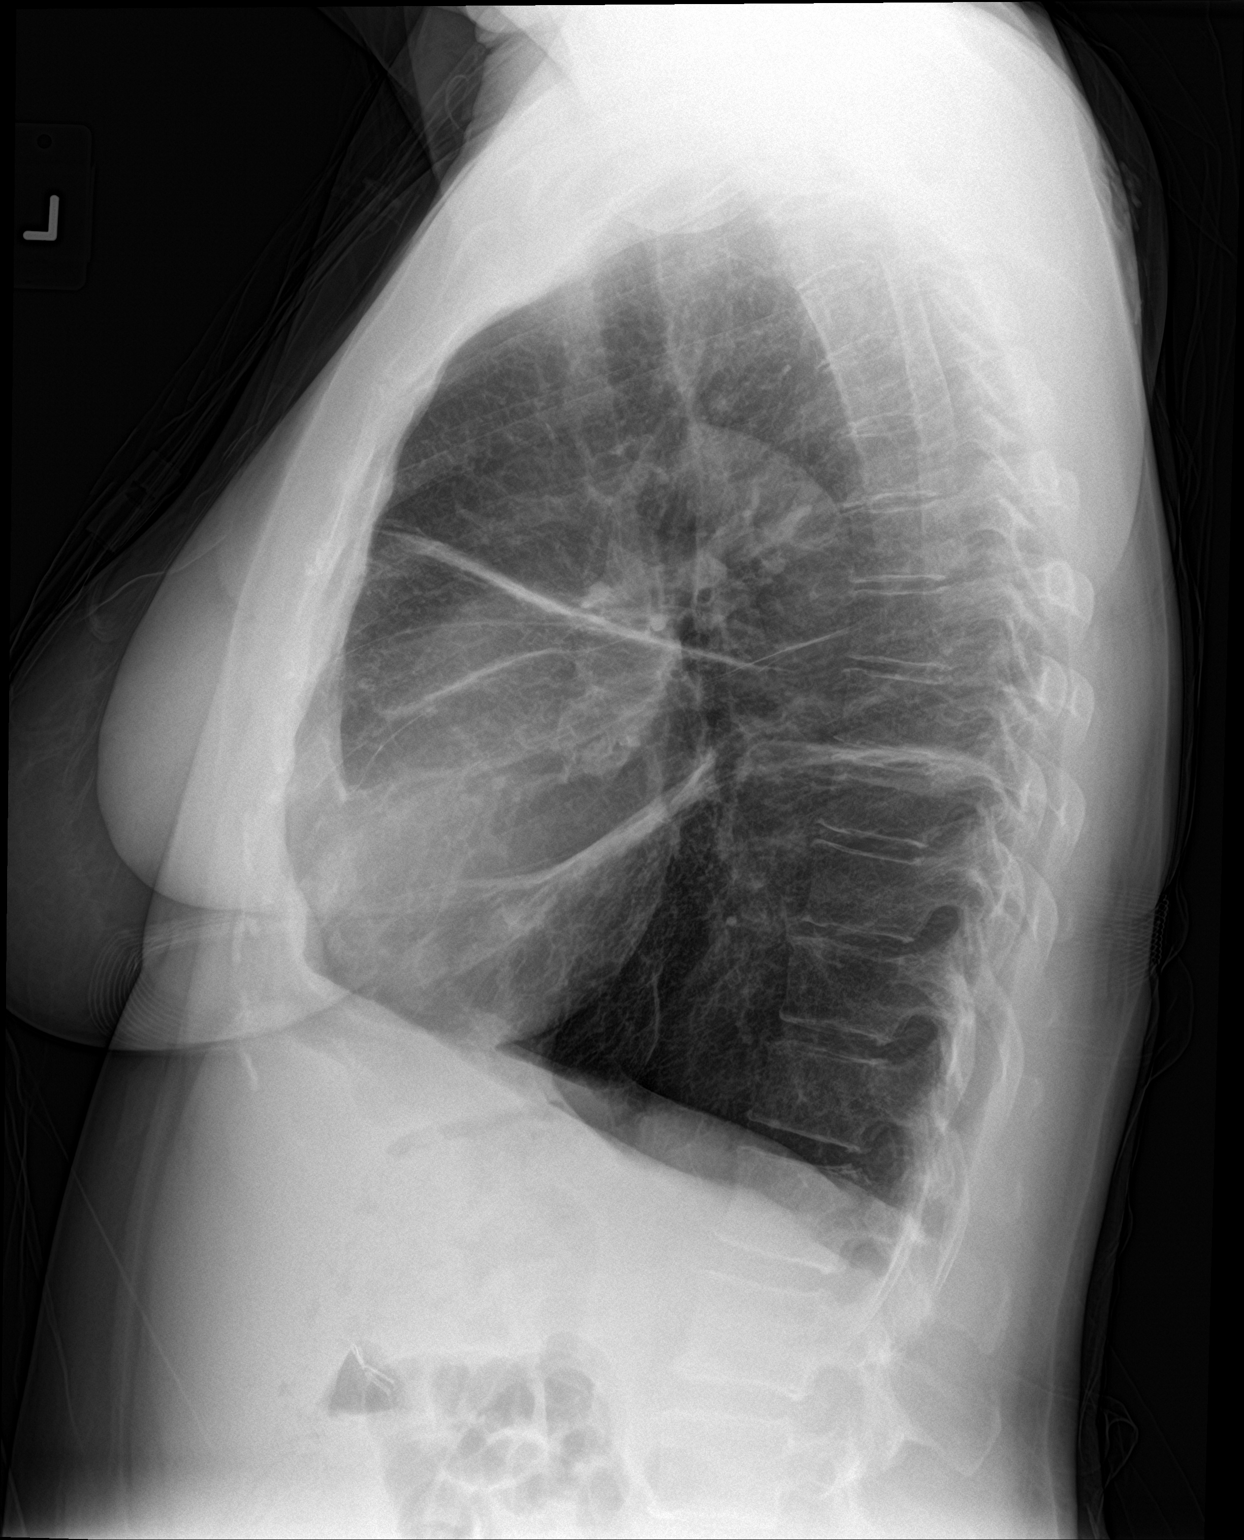

[2 of 2 positions shown; findings below may reference images not displayed]

FINDINGS: Areas of linear scarring are noted bilaterally stable from the prior
exam. The lungs are hyperinflated consistent with COPD. No focal
infiltrate or sizable effusion is seen. No bony abnormality is
noted. Cardiac shadow is stable.
IMPRESSION: COPD with bilateral chronic scarring.

## 2017-02-16 ENCOUNTER — Telehealth: Payer: Self-pay | Admitting: Internal Medicine

## 2017-02-16 DIAGNOSIS — F411 Generalized anxiety disorder: Secondary | ICD-10-CM

## 2017-02-16 MED ORDER — ALPRAZOLAM 1 MG PO TABS: ORAL_TABLET | ORAL | 0 refills | Status: DC

## 2017-02-16 NOTE — Telephone Encounter (Signed)
Patient calling about xanax refill that was not received by [facility]  Please resend

## 2017-02-16 NOTE — Telephone Encounter (Signed)
Rx for xanax 1mg  has been phoned in to [facility] on [address], as it appears that previous Rx was printed on 02/11/17.  Pt is aware and voiced her understanding.  Nothing further is needed.

## 2017-03-05 ENCOUNTER — Other Ambulatory Visit: Payer: Self-pay | Admitting: Internal Medicine

## 2017-03-05 DIAGNOSIS — F411 Generalized anxiety disorder: Secondary | ICD-10-CM

## 2017-03-07 ENCOUNTER — Other Ambulatory Visit: Payer: Self-pay | Admitting: Internal Medicine

## 2017-03-07 DIAGNOSIS — F411 Generalized anxiety disorder: Secondary | ICD-10-CM

## 2017-03-09 ENCOUNTER — Telehealth: Payer: Self-pay | Admitting: Internal Medicine

## 2017-03-09 DIAGNOSIS — F411 Generalized anxiety disorder: Secondary | ICD-10-CM

## 2017-03-09 MED ORDER — ALPRAZOLAM 1 MG PO TABS: ORAL_TABLET | ORAL | 1 refills | Status: DC

## 2017-03-09 NOTE — Telephone Encounter (Signed)
Patient calling about xanax refill that was not received by [facility]  Please resend

## 2017-03-09 NOTE — Telephone Encounter (Signed)
RX sent per DK and ref to psychiatry. Order placed. Pt informed. nothing further needed

## 2017-03-23 ENCOUNTER — Other Ambulatory Visit: Payer: Self-pay | Admitting: Pulmonary Disease

## 2017-04-20 ENCOUNTER — Other Ambulatory Visit: Payer: Self-pay | Admitting: Physician Assistant

## 2017-04-20 DIAGNOSIS — Z1239 Encounter for other screening for malignant neoplasm of breast: Secondary | ICD-10-CM

## 2017-04-30 ENCOUNTER — Other Ambulatory Visit: Payer: Self-pay | Admitting: Internal Medicine

## 2017-05-08 ENCOUNTER — Ambulatory Visit
Admission: RE | Admit: 2017-05-08 | Discharge: 2017-05-08 | Disposition: A | Discharge status: Home / Self Care | Payer: Medicare Other | Source: Ambulatory Visit | Attending: Physician Assistant | Admitting: Physician Assistant

## 2017-05-08 ENCOUNTER — Other Ambulatory Visit: Payer: Self-pay | Admitting: Physician Assistant

## 2017-05-08 DIAGNOSIS — Z1239 Encounter for other screening for malignant neoplasm of breast: Secondary | ICD-10-CM

## 2017-05-08 DIAGNOSIS — R921 Mammographic calcification found on diagnostic imaging of breast: Secondary | ICD-10-CM | POA: Insufficient documentation

## 2017-05-08 HISTORY — DX: Personal history of irradiation: Z92.3

## 2017-06-01 ENCOUNTER — Other Ambulatory Visit: Payer: Self-pay | Admitting: Internal Medicine

## 2017-06-18 ENCOUNTER — Other Ambulatory Visit: Payer: Self-pay | Admitting: Internal Medicine

## 2017-07-23 ENCOUNTER — Other Ambulatory Visit: Payer: Self-pay | Admitting: Internal Medicine

## 2017-10-15 ENCOUNTER — Telehealth: Payer: Self-pay | Admitting: Internal Medicine

## 2017-10-15 MED ORDER — ALBUTEROL SULFATE HFA 108 (90 BASE) MCG/ACT IN AERS
2.00 | INHALATION_SPRAY | RESPIRATORY_TRACT | Status: DC
Start: ? — End: 2017-10-15

## 2017-10-15 MED ORDER — TIOTROPIUM BROMIDE MONOHYDRATE 18 MCG IN CAPS
18.00 | ORAL_CAPSULE | RESPIRATORY_TRACT | Status: DC
Start: 2017-10-16 — End: 2017-10-15

## 2017-10-15 MED ORDER — NICOTINE POLACRILEX 2 MG MT LOZG
2.00 | LOZENGE | OROMUCOSAL | Status: DC
Start: ? — End: 2017-10-15

## 2017-10-15 MED ORDER — BUDESONIDE-FORMOTEROL FUMARATE 160-4.5 MCG/ACT IN AERO
2.00 | INHALATION_SPRAY | RESPIRATORY_TRACT | Status: DC
Start: 2017-10-15 — End: 2017-10-15

## 2017-10-15 MED ORDER — HYDROXYZINE HCL 25 MG PO TABS
25.00 | ORAL_TABLET | ORAL | Status: DC
Start: ? — End: 2017-10-15

## 2017-10-15 MED ORDER — GUAIFENESIN 400 MG PO TABS
400.00 | ORAL_TABLET | ORAL | Status: DC
Start: 2017-10-16 — End: 2017-10-15

## 2017-10-15 MED ORDER — PREDNISONE 5 MG PO TABS
5.00 | ORAL_TABLET | ORAL | Status: DC
Start: 2017-10-16 — End: 2017-10-15

## 2017-10-15 MED ORDER — CETIRIZINE HCL 10 MG PO TABS
10.00 | ORAL_TABLET | ORAL | Status: DC
Start: 2017-10-16 — End: 2017-10-15

## 2017-10-15 MED ORDER — LORAZEPAM 2 MG PO TABS
2.00 | ORAL_TABLET | ORAL | Status: DC
Start: ? — End: 2017-10-15

## 2017-10-15 MED ORDER — SERTRALINE HCL 25 MG PO TABS
25.00 | ORAL_TABLET | ORAL | Status: DC
Start: 2017-10-16 — End: 2017-10-15

## 2017-10-15 NOTE — Telephone Encounter (Signed)
Pt is in hospital at [facility] for suicidal ideation and Denton Ar is asking that you give her a call because they want to discuss the Prednisone and cough syrup.    Please call Alanda Amass at [phone] to discuss

## 2017-10-15 NOTE — Telephone Encounter (Signed)
Lesly Rubenstein calling from [facility] Patient is currently admitted and would like to speak with nurse in regards to plan of care moving forward Please call Alanda Amass at [phone] to discuss

## 2017-10-16 NOTE — Telephone Encounter (Signed)
Called nurse Denton Ar at [facility] Provided COPD update to nurse

## 2017-10-28 ENCOUNTER — Other Ambulatory Visit: Payer: Self-pay | Admitting: Internal Medicine

## 2017-10-29 ENCOUNTER — Encounter: Payer: Self-pay | Admitting: Emergency Medicine

## 2017-10-29 ENCOUNTER — Emergency Department
Admission: EM | Admit: 2017-10-29 | Discharge: 2017-10-29 | Disposition: A | Discharge status: Home / Self Care | Payer: Medicare Other | Attending: Emergency Medicine | Admitting: Emergency Medicine

## 2017-10-29 ENCOUNTER — Emergency Department: Payer: Medicare Other

## 2017-10-29 DIAGNOSIS — Z79899 Other long term (current) drug therapy: Secondary | ICD-10-CM | POA: Diagnosis not present

## 2017-10-29 DIAGNOSIS — Z87891 Personal history of nicotine dependence: Secondary | ICD-10-CM | POA: Insufficient documentation

## 2017-10-29 DIAGNOSIS — J441 Chronic obstructive pulmonary disease with (acute) exacerbation: Secondary | ICD-10-CM | POA: Diagnosis not present

## 2017-10-29 DIAGNOSIS — Z853 Personal history of malignant neoplasm of breast: Secondary | ICD-10-CM | POA: Diagnosis not present

## 2017-10-29 DIAGNOSIS — R0602 Shortness of breath: Secondary | ICD-10-CM | POA: Diagnosis present

## 2017-10-29 DIAGNOSIS — Z7982 Long term (current) use of aspirin: Secondary | ICD-10-CM | POA: Diagnosis not present

## 2017-10-29 LAB — CBC WITH DIFFERENTIAL/PLATELET
Basophils Absolute: 0.1 10*3/uL (ref 0–0.1)
Basophils Relative: 1 %
EOS ABS: 0.1 10*3/uL (ref 0–0.7)
EOS PCT: 1 %
HCT: 34.3 % — ABNORMAL LOW (ref 35.0–47.0)
Hemoglobin: 11.3 g/dL — ABNORMAL LOW (ref 12.0–16.0)
LYMPHS ABS: 1.7 10*3/uL (ref 1.0–3.6)
Lymphocytes Relative: 22 %
MCH: 28.2 pg (ref 26.0–34.0)
MCHC: 33 g/dL (ref 32.0–36.0)
MCV: 85.5 fL (ref 80.0–100.0)
MONOS PCT: 7 %
Monocytes Absolute: 0.5 10*3/uL (ref 0.2–0.9)
Neutro Abs: 5.2 10*3/uL (ref 1.4–6.5)
Neutrophils Relative %: 69 %
PLATELETS: 274 10*3/uL (ref 150–440)
RBC: 4.01 MIL/uL (ref 3.80–5.20)
RDW: 12.9 % (ref 11.5–14.5)
WBC: 7.5 10*3/uL (ref 3.6–11.0)

## 2017-10-29 LAB — COMPREHENSIVE METABOLIC PANEL
ALT: 62 U/L — ABNORMAL HIGH (ref 0–44)
AST: 55 U/L — ABNORMAL HIGH (ref 15–41)
Albumin: 3.5 g/dL (ref 3.5–5.0)
Alkaline Phosphatase: 63 U/L (ref 38–126)
Anion gap: 7 (ref 5–15)
BUN: 12 mg/dL (ref 6–20)
CHLORIDE: 103 mmol/L (ref 98–111)
CO2: 32 mmol/L (ref 22–32)
Calcium: 8.6 mg/dL — ABNORMAL LOW (ref 8.9–10.3)
Creatinine, Ser: 0.67 mg/dL (ref 0.44–1.00)
GFR calc non Af Amer: >60 mL/min (ref >60)
Glucose, Bld: 115 mg/dL — ABNORMAL HIGH (ref 70–99)
Potassium: 4 mmol/L (ref 3.5–5.1)
SODIUM: 142 mmol/L (ref 135–145)
Total Bilirubin: 0.5 mg/dL (ref 0.3–1.2)
Total Protein: 7 g/dL (ref 6.5–8.1)

## 2017-10-29 LAB — TROPONIN I: Troponin I: <0.03 ng/mL (ref <0.03)

## 2017-10-29 LAB — FIBRIN DERIVATIVES D-DIMER (ARMC ONLY): Fibrin derivatives D-dimer (ARMC): 246.06 ng/mL (FEU) (ref 0.00–499.00)

## 2017-10-29 LAB — BRAIN NATRIURETIC PEPTIDE: B NATRIURETIC PEPTIDE 5: 45 pg/mL (ref 0.0–100.0)

## 2017-10-29 MED ORDER — PREDNISONE 20 MG PO TABS: 20.0000 mg | ORAL_TABLET | Freq: Every day | ORAL | 0 refills | Status: DC

## 2017-10-29 MED ORDER — IPRATROPIUM-ALBUTEROL 0.5-2.5 (3) MG/3ML IN SOLN
3.0000 mL | Freq: Once | RESPIRATORY_TRACT | Status: AC
  Administered 2017-10-29: 3 mL via RESPIRATORY_TRACT
  Filled 2017-10-29: qty 3

## 2017-10-29 NOTE — ED Triage Notes (Signed)
Pt arrived with complaints of shortness of breath since 430 this am. Pt has a HX of COPD and wears 2L chronically. Pt state she took 3 duo nebs prior to calling ems and increased her o2 to 3L. EMS arrived at pt's house and pt's oxygen saturation in the 80s on 3L. Pt was given 125 of solu medrol and 1 albuterol treatment. Pt arrives with an oxygen saturation of 98% on 2L. Pt reports "I feel a little better, I didn't want to come and wish they hadn't messed with my medications." Pt states she was recently hospitalized voluntarily for depression in which time here medication was changed. Pt denies any pain.

## 2017-10-29 NOTE — ED Notes (Signed)
I walked the patient down the hallway of C pod. She was fine until we got to room 32 and she became very short of breath. Her spo2 decresed from 96% on 2lpm to 94%. Denied any dizziness.

## 2017-10-29 NOTE — ED Provider Notes (Signed)
[facility] Emergency Department Provider Note   ____________________________________________   First MD Initiated Contact with Patient 10/29/17 1346     (approximate)  I have reviewed the triage vital signs and the nursing notes.   HISTORY  Chief Complaint Shortness of Breath    HPI [name] [name] is a 57 y.o. female patient reports she was at [facility] and while she was there they adjusted her breathing medications although she was not having a lot of trouble then.  Since then she is gotten a lot worse fairly rapidly with her breathing.   Past Medical History:  Diagnosis Date  . Breast cancer ([place]) 2011   BILAT  lumpectomy 2011  . Cancer ([place])   . COPD (chronic obstructive pulmonary disease) ([place])   . Personal history of radiation therapy 2011   fu bil. breast cancer    Patient Active Problem List   Diagnosis Date Noted  . Acute on chronic respiratory failure with hypoxia ([place]) 01/04/2016  . Leukocytosis 01/04/2016  . COPD (chronic obstructive pulmonary disease) ([place]) 01/04/2016  . History of breast cancer 11/13/2015  . Shingles 11/13/2015  . Persistent depressive disorder 11/11/2015  . PTSD (post-traumatic stress disorder) 11/11/2015  . Respiratory failure with hypercapnia ([place]) 10/31/2015  . Anxiety 05/06/2015  . Smoker 05/06/2015  . Emphysema lung ([place]) 05/06/2015  . Acute on chronic respiratory failure ([place]) 07/15/2014  . COPD exacerbation ([place]) 07/15/2014  . Acute bronchitis 07/15/2014  . Tachycardia 07/15/2014    Past Surgical History:  Procedure Laterality Date  . ABDOMINAL HYSTERECTOMY    . BREAST BIOPSY Left 2011   positive  . BREAST BIOPSY Right 2011   positive  . BREAST LUMPECTOMY Bilateral 2011    Prior to Admission medications   Medication Sig Start Date End Date Taking? Authorizing Provider  albuterol (PROAIR HFA) 108 (90 Base) MCG/ACT inhaler INHALE 2 PUFFS BY MOUTH EVERY 6 HOURS AS NEEDED FOR WHEEZING OR  SHORTNESS OF BREATH. 01/06/17   Flora Lipps, MD  albuterol (PROVENTIL HFA;VENTOLIN HFA) 108 (90 Base) MCG/ACT inhaler INHALE 2 PUFFS BY MOUTH EVERY 6 HOURS AS NEEDED FOR WHEEZING OR SHORTNESS OF BREATH 07/23/17   Flora Lipps, MD  ALPRAZolam Duanne Moron) 1 MG tablet Take 1/2 tab three times daily as needed for anxiety 03/09/17   Flora Lipps, MD  aspirin 81 MG EC tablet Take 1 tablet (81 mg total) by mouth daily. 05/14/15   Demetrios Loll, MD  cetirizine (ZYRTEC ALLERGY) 10 MG tablet Take 1 tablet (10 mg total) by mouth daily. 01/06/17   Flora Lipps, MD  DULERA 200-5 MCG/ACT AERO INHALE 2 PUFFS INTO THE LUNGS TWICE DAILY 03/24/17   Flora Lipps, MD  DULERA 200-5 MCG/ACT AERO INHALE 2 PUFFS INTO THE LUNGS TWICE DAILY 10/29/17   Flora Lipps, MD  fluticasone (FLONASE) 50 MCG/ACT nasal spray Place 1 spray into both nostrils daily. 01/06/17 01/06/18  Flora Lipps, MD  guaiFENesin (MUCINEX) 600 MG 12 hr tablet Take 1 tablet (600 mg total) by mouth 2 (two) times daily as needed for cough or to loosen phlegm. 01/06/17   Flora Lipps, MD  ipratropium-albuterol (DUONEB) 0.5-2.5 (3) MG/3ML SOLN USE 3 ML VIA NEBULIZER EVERY 4 HOURS AS NEEDED FOR WHEEZING OR SHORTNESS OF BREATH 01/06/17   Flora Lipps, MD  ipratropium-albuterol (DUONEB) 0.5-2.5 (3) MG/3ML SOLN USE 3 ML VIA NEBULIZER EVERY 4 HOURS AS NEEDED FOR WHEEZING OR SHORTNESS OF BREATH 04/30/17   Flora Lipps, MD  ipratropium-albuterol (DUONEB) 0.5-2.5 (3) MG/3ML SOLN USE 3  ML VIA NEBULIZER EVERY 4 HOURS AS NEEDED FOR WHEEZING OR SHORTNESS OF BREATH 06/18/17   Flora Lipps, MD  levocetirizine (XYZAL) 5 MG tablet Take 5 mg by mouth every evening.    [provider]  mometasone-formoterol (DULERA) 200-5 MCG/ACT AERO Inhale 2 puffs into the lungs 2 (two) times daily. 01/06/17   Flora Lipps, MD  predniSONE (DELTASONE) 20 MG tablet Take 1 tablet (20 mg total) by mouth daily with breakfast. 10 days 01/06/17   Flora Lipps, MD  predniSONE (DELTASONE) 20 MG tablet Take 1 tablet  (20 mg total) by mouth daily. 10/29/17 10/29/18  Nena Polio, MD  predniSONE (DELTASONE) 5 MG tablet Take 1 tablet (5 mg total) by mouth daily with breakfast. 01/06/17   Flora Lipps, MD  predniSONE (DELTASONE) 5 MG tablet TAKE 1 TABLET(5 MG) BY MOUTH DAILY WITH BREAKFAST 02/02/17   Flora Lipps, MD  QUEtiapine (SEROQUEL) 50 MG tablet Take 1 tablet (50 mg total) by mouth at bedtime. 05/14/15   Demetrios Loll, MD  SPIRIVA HANDIHALER 18 MCG inhalation capsule PLACE 1 CAPSULE INTO INHALER AND INHALE EVERY DAY 02/09/17   Wilhelmina Mcardle, MD  SPIRIVA HANDIHALER 18 MCG inhalation capsule INHALE CONTENTS OF ONE CAPSULE ONCE DAILY USING HANDIHALER 07/23/17   Flora Lipps, MD  tiotropium (SPIRIVA) 18 MCG inhalation capsule Place 1 capsule (18 mcg total) into inhaler and inhale daily. 01/06/17   Flora Lipps, MD    Allergies Zithromax [azithromycin] and Penicillins  Family History  Problem Relation Age of Onset  . Breast cancer Mother   . Lung cancer Mother     Social History Social History   Tobacco Use  . Smoking status: Former Smoker    Packs/day: 0.50    Years: 40.00    Pack years: 20.00  . Smokeless tobacco: Never Used  . Tobacco comment: quit smoking 09/26/15  Substance Use Topics  . Alcohol use: No  . Drug use: No    Review of Systems  Constitutional: No fever/chills Eyes: No visual changes. ENT: No sore throat. Cardiovascular: Denies chest pain. Respiratory: Denies shortness of breath. Gastrointestinal: No abdominal pain.  No nausea, no vomiting.  No diarrhea.  No constipation. Genitourinary: Negative for dysuria. Musculoskeletal: Negative for back pain. Skin: Negative for rash. Neurological: Negative for headaches, focal weakness  ____________________________________________   PHYSICAL EXAM:  VITAL SIGNS: ED Triage Vitals  Enc Vitals Group     BP 10/29/17 1351 116/78     Pulse Rate 10/29/17 1351 95     Resp 10/29/17 1351 (!) 31     Temp 10/29/17 1351 97.8 F (36.6 C)       Temp Source 10/29/17 1351 Oral     SpO2 10/29/17 1351 97 %     Weight 10/29/17 1352 140 lb (63.5 kg)     Height 10/29/17 1352 5\' 2"  (1.575 m)     Head Circumference --      Peak Flow --      Pain Score 10/29/17 1352 0     Pain Loc --      Pain Edu? --      Excl. in [place]? --     Constitutional: Alert and oriented. Well appearing and in no acute distress. Eyes: Conjunctivae are normal. PERRL. EOMI. Head: Atraumatic. Nose: No congestion/rhinnorhea. Mouth/Throat: Mucous membranes are moist.  Oropharynx non-erythematous. Neck: No stridor Cardiovascular: Normal rate, regular rhythm. Grossly normal heart sounds.  Good peripheral circulation. Respiratory: Normal respiratory effort.  No retractions. Lungs CTAB. Gastrointestinal: Soft and  nontender. No distention. No abdominal bruits. No CVA tenderness. Musculoskeletal: No lower extremity tenderness nor edema.   Neurologic:  Normal speech and language. No gross focal neurologic deficits are appreciated. Skin:  Skin is warm, dry and intact. No rash noted. Psychiatric: Mood and affect are normal. Speech and behavior are normal.  ____________________________________________   LABS (all labs ordered are listed, but only abnormal results are displayed)  Labs Reviewed  CBC WITH DIFFERENTIAL/PLATELET - Abnormal; Notable for the following components:      Result Value   Hemoglobin 11.3 (*)    HCT 34.3 (*)    All other components within normal limits  COMPREHENSIVE METABOLIC PANEL - Abnormal; Notable for the following components:   Glucose, Bld 115 (*)    Calcium 8.6 (*)    AST 55 (*)    ALT 62 (*)    All other components within normal limits  TROPONIN I  FIBRIN DERIVATIVES D-DIMER ([facility] ONLY)  BRAIN NATRIURETIC PEPTIDE   ____________________________________________  EKG  EKG read and interpreted by me shows normal sinus rhythm rate of 98 normal axis there is PR segment depression diffusely especially inferiorly.  Computer is  reading ST elevation I believe is really PR segment depression ____________________________________________  RADIOLOGY  ED MD interpretation: Test x-ray read by radiology reviewed by me shows no acute disease  Official radiology report(s): Dg Chest 2 View  Result Date: 10/29/2017 CLINICAL DATA:  Shortness of breath since this morning EXAM: CHEST - 2 VIEW COMPARISON:  July 22 2016 FINDINGS: The heart size and mediastinal contours are within normal limits. Chronic scar are noted in the bilateral mid lung unchanged. The lungs are hyperinflated. There is no focal pneumonia, pulmonary edema or pleural effusion. The visualized skeletal structures are unremarkable. IMPRESSION: No active cardiopulmonary disease. Chronic scar of both lungs. COPD. Electronically Signed   By: Abelardo Diesel M.D.   On: 10/29/2017 14:19    ____________________________________________   PROCEDURES  Procedure(s) performed:   Procedures  Critical Care performed:   ____________________________________________   INITIAL IMPRESSION / ASSESSMENT AND PLAN / ED COURSE Patient able to walk for a considerable distance O2 sats go down to 94% only.  Patient does feel short of breath.  On returning and resting her respiratory rate goes down.  She feels better.  I will try some outpatient steroids and a short burst.  She will follow-up with her regular doctor and return if she is worse.          ____________________________________________   FINAL CLINICAL IMPRESSION(S) / ED DIAGNOSES  Final diagnoses:  COPD exacerbation [facility])     ED Discharge Orders         Ordered    predniSONE (DELTASONE) 20 MG tablet  Daily     10/29/17 1632           Note:  This document was prepared using Dragon voice recognition software and may include unintentional dictation errors.    Nena Polio, MD 10/29/17 [phone]

## 2017-10-29 NOTE — Discharge Instructions (Addendum)
Use your DuoNeb's up to 4 times a day as needed.  Take the prednisone once a day start today.  Please follow-up with your regular doctors.  Return if you are worse.  Or no better in 2 to 3 days.

## 2017-11-12 ENCOUNTER — Ambulatory Visit: Payer: Medicare Other | Admitting: Internal Medicine

## 2017-11-13 ENCOUNTER — Encounter: Payer: Self-pay | Admitting: Internal Medicine

## 2017-11-26 ENCOUNTER — Encounter: Payer: Self-pay | Admitting: Internal Medicine

## 2017-11-26 ENCOUNTER — Ambulatory Visit (INDEPENDENT_AMBULATORY_CARE_PROVIDER_SITE_OTHER): Payer: Medicare Other | Admitting: Internal Medicine

## 2017-11-26 VITALS — BP 102/70 | HR 96 | Resp 16 | Ht 62.0 in | Wt 141.0 lb

## 2017-11-26 DIAGNOSIS — J9611 Chronic respiratory failure with hypoxia: Secondary | ICD-10-CM

## 2017-11-26 DIAGNOSIS — J449 Chronic obstructive pulmonary disease, unspecified: Secondary | ICD-10-CM | POA: Diagnosis not present

## 2017-11-26 NOTE — Addendum Note (Signed)
Addended by: Stephanie Coup on: 11/26/2017 04:25 PM   Modules accepted: Orders

## 2017-11-26 NOTE — Patient Instructions (Signed)
CONTINUE INHALERS as PRESCRIBED CONTINUE BIPAP AS PRESCRIBED CONTINUE OXYGEN AS PRESCRIBED\  FLU SHOT UP TO DATE

## 2017-11-26 NOTE — Progress Notes (Signed)
[place] [facility] Consultation     Date: 11/26/2017,   MRN# [id number] [name] [name] 06/06/1960   CHIEF COMPLAINT:   Follow up COPD  HISTORY OF PRESENT ILLNESS  Patient on BiPAP for end-stage COPD Patient on BiPAP for end-stage COPD  Patient on BiPAP for end-stage COPD  On chronic oxygen therapy with chronic hypoxic respiratory failure   Patient uses and benefits BiPAP with oxygen at night  She benefits from this therapy and is preventing recurrent admissions to the hospital   She has chronic shortness of breath and dyspnea on exertion seems to be at baseline at this time She is on chronic prednisone therapy 5 mg daily She is currently on Dulera and Spiriva albuterol as needed  No signs of infection at this time  BiPAP compliance report reviewed with patient 100% compliant with days 93% compliant with more than 4 hours/day AHI is 0.1  No signs of infection at this time No signs of exacerbation at this time  Last reported FEV1 in 2017 was 28% predicted Severe COPD    Her current pulmonary regimen is as follows: Dulera 200-5, 2 actuations BID Spiriva HH - once a day Prednisone 5  mg daily Albuterol MDI PRN Duoneb PRN BiPAP @ HS  Current Medication:   Current Outpatient Medications:  .  albuterol (PROAIR HFA) 108 (90 Base) MCG/ACT inhaler, INHALE 2 PUFFS BY MOUTH EVERY 6 HOURS AS NEEDED FOR WHEEZING OR SHORTNESS OF BREATH., Disp: 3 Inhaler, Rfl: 3 .  ALPRAZolam (XANAX) 1 MG tablet, Take 1/2 tab three times daily as needed for anxiety, Disp: 30 tablet, Rfl: 1 .  aspirin 81 MG EC tablet, Take 1 tablet (81 mg total) by mouth daily., Disp: 30 tablet, Rfl: 1 .  cetirizine (ZYRTEC ALLERGY) 10 MG tablet, Take 1 tablet (10 mg total) by mouth daily., Disp: 90 tablet, Rfl: 3 .  DULERA 200-5 MCG/ACT AERO, INHALE 2 PUFFS INTO THE LUNGS TWICE DAILY, Disp: 39 g, Rfl: 3 .  fluticasone (FLONASE) 50 MCG/ACT nasal spray, Place 1 spray into both nostrils  daily., Disp: 1 g, Rfl: 5 .  guaiFENesin (MUCINEX) 600 MG 12 hr tablet, Take 1 tablet (600 mg total) by mouth 2 (two) times daily as needed for cough or to loosen phlegm., Disp: 20 tablet, Rfl: 0 .  ipratropium-albuterol (DUONEB) 0.5-2.5 (3) MG/3ML SOLN, USE 3 ML VIA NEBULIZER EVERY 4 HOURS AS NEEDED FOR WHEEZING OR SHORTNESS OF BREATH, Disp: 1080 mL, Rfl: 5 .  levocetirizine (XYZAL) 5 MG tablet, Take 5 mg by mouth every evening., Disp: , Rfl:  .  mometasone-formoterol (DULERA) 200-5 MCG/ACT AERO, Inhale 2 puffs into the lungs 2 (two) times daily., Disp: 3 Inhaler, Rfl: 3 .  predniSONE (DELTASONE) 20 MG tablet, Take 1 tablet (20 mg total) by mouth daily., Disp: 4 tablet, Rfl: 0 .  QUEtiapine (SEROQUEL) 50 MG tablet, Take 1 tablet (50 mg total) by mouth at bedtime., Disp: 30 tablet, Rfl: 0 .  SPIRIVA HANDIHALER 18 MCG inhalation capsule, PLACE 1 CAPSULE INTO INHALER AND INHALE EVERY DAY, Disp: 90 capsule, Rfl: 1 .  SPIRIVA HANDIHALER 18 MCG inhalation capsule, INHALE CONTENTS OF ONE CAPSULE ONCE DAILY USING HANDIHALER, Disp: 90 capsule, Rfl: 0 .  tiotropium (SPIRIVA) 18 MCG inhalation capsule, Place 1 capsule (18 mcg total) into inhaler and inhale daily., Disp: 90 capsule, Rfl: 3     ALLERGIES   Zithromax [azithromycin] and Penicillins     REVIEW OF SYSTEMS   Review of Systems  Constitutional:  Negative for chills, fever, malaise/fatigue and weight loss.  HENT: Negative for congestion.   Respiratory: Positive for shortness of breath. Negative for cough, hemoptysis, sputum production and wheezing.   Cardiovascular: Negative for chest pain, palpitations, orthopnea and leg swelling.  Gastrointestinal: Negative for heartburn, nausea and vomiting.  Psychiatric/Behavioral: Negative for depression. The patient is not nervous/anxious.   All other systems reviewed and are negative.   BP 102/70 (BP Location: Left Arm, Cuff Size: Normal)   Pulse 96   Resp 16   Ht 5\' 2"  (1.575 m)   Wt 141 lb  (64 kg)   SpO2 100%   BMI 25.79 kg/m    PHYSICAL EXAM   Physical Exam  Constitutional: She is oriented to person, place, and time. No distress.  Cardiovascular: Normal rate, regular rhythm and normal heart sounds.  No murmur heard. Pulmonary/Chest: Effort normal and breath sounds normal. No stridor. No respiratory distress. She has no wheezes. She has no rales.  Musculoskeletal: Normal range of motion. She exhibits no edema.  Neurological: She is alert and oriented to person, place, and time.  Skin: Skin is warm. She is not diaphoretic.  Psychiatric: She has a normal mood and affect.       ASSESSMENT/PLAN   57 year old African-American female seen today for follow-up of severe COPD Gold stage D with chronic respiratory failure with hypoxia from her COPD  Patient uses and benefits from noninvasive ventilation at night and definitely prevents her from relapsing and has prevented recurrent admissions and has improved her quality of life Patient needs and requires noninvasive ventilation to survive   #1 end-stage COPD requiring BiPAP at night for survival Continue BiPAP settings as prescribed Continue oxygen therapy as prescribed Patient is 100% compliant with AHI of 0.1 No major leaks  #2 COPD severe Continue inhalers as prescribed Prednisone daily 5 mg Dulera and Spiriva Albuterol as needed  #3 allergic rhinitis Flonase and Allegra  #4 chronic hypoxic respiratory failure from COPD Continue oxygen as prescribed Patient has completed pulmonary rehab program   No signs of exacerbation at this time No indication for steroids or antibiotics at this time  Patient satisfied with plan of action and management and visit Follow-up in 6 months  Flu shot is up-to-date   Krystyl Cannell Patricia Pesa, M.D.  [facility]  Medical Director [place] Director [facility] Cardio-Pulmonary Department

## 2018-01-23 ENCOUNTER — Other Ambulatory Visit: Payer: Self-pay | Admitting: Internal Medicine

## 2018-01-28 ENCOUNTER — Other Ambulatory Visit: Payer: Self-pay | Admitting: Internal Medicine

## 2018-04-12 ENCOUNTER — Ambulatory Visit (INDEPENDENT_AMBULATORY_CARE_PROVIDER_SITE_OTHER): Payer: Medicare Other | Admitting: Internal Medicine

## 2018-04-12 ENCOUNTER — Encounter: Payer: Self-pay | Admitting: Internal Medicine

## 2018-04-12 VITALS — BP 120/80 | HR 94 | Resp 16 | Ht 62.0 in | Wt 138.0 lb

## 2018-04-12 DIAGNOSIS — J449 Chronic obstructive pulmonary disease, unspecified: Secondary | ICD-10-CM

## 2018-04-12 DIAGNOSIS — J9612 Chronic respiratory failure with hypercapnia: Secondary | ICD-10-CM

## 2018-04-12 DIAGNOSIS — J9611 Chronic respiratory failure with hypoxia: Secondary | ICD-10-CM | POA: Diagnosis not present

## 2018-04-12 MED ORDER — CETIRIZINE HCL 10 MG PO TABS: 10.0000 mg | ORAL_TABLET | Freq: Every day | ORAL | 6 refills | Status: AC

## 2018-04-12 NOTE — Patient Instructions (Signed)
Continue BIPAP as prescribed-EXCELLENT REPORT!!  Continue oxygen as prescribed  Continue inhalers as prescribed

## 2018-04-12 NOTE — Progress Notes (Signed)
[place] [facility] Consultation     Date: 04/12/2018,   MRN# [id number] MELAINE MCPHEE December 07, 1960  Her current pulmonary regimen is as follows: Dulera 200-5, 2 actuations BID Spiriva HH - once a day Prednisone 5  mg daily Albuterol MDI PRN Duoneb PRN BiPAP @ HS Last reported FEV1 in 2017 was 28% predicted Severe COPD CHIEF COMPLAINT:   Follow up COPD  HISTORY OF PRESENT ILLNESS  Patient on BiPAP for end-stage COPD P Recent admission atient on BiPAP for end-stage COPD patient on BiPAP for end-stCOPD Patient with recent admission to [facility] for H[facility] Prior to that was admitted for severe sepsis from UTI Patient has since recovered from admission  Seems to be at baseline Now on 3L [place] oxygen therapy  Patient on BiPAP for end-stage COPD Patient on chronic oxygen therapy for chronic hypoxic respiratory failure from COPD 2 L nasal cannula at night Patient uses this and benefits from oxygen therapy and needs this for survival  Patient needs and benefits from BiPAP with oxygen to prevent recurrent admissions to the hospital  Chronic shortness of breath and dyspnea on exertion seems to be at baseline Chronic prednisone therapy 5 mg daily Currently on Dulera and Spiriva Albuterol as needed  No signs of infection at this time No signs of exacerbation at this time    BiPAP compliance report reviewed with patient 100% compliant with days 100% compliant with more than 4 hours/day AHI is 0.5        Current Medication:   Current Outpatient Medications:  .  ALPRAZolam (XANAX) 1 MG tablet, Take 1/2 tab three times daily as needed for anxiety, Disp: 30 tablet, Rfl: 1 .  aspirin 81 MG EC tablet, Take 1 tablet (81 mg total) by mouth daily., Disp: 30 tablet, Rfl: 1 .  cetirizine (ZYRTEC ALLERGY) 10 MG tablet, Take 1 tablet (10 mg total) by mouth daily., Disp: 90 tablet, Rfl: 3 .  DULERA 200-5 MCG/ACT AERO, INHALE 2 PUFFS INTO LUNGS TWICE DAILY, Disp: 39 g, Rfl:  3 .  fluticasone (FLONASE) 50 MCG/ACT nasal spray, Place 1 spray into both nostrils daily., Disp: 1 g, Rfl: 5 .  guaiFENesin (MUCINEX) 600 MG 12 hr tablet, Take 1 tablet (600 mg total) by mouth 2 (two) times daily as needed for cough or to loosen phlegm., Disp: 20 tablet, Rfl: 0 .  ipratropium-albuterol (DUONEB) 0.5-2.5 (3) MG/3ML SOLN, USE 3 ML VIA NEBULIZER EVERY 4 HOURS AS NEEDED FOR WHEEZING OR SHORTNESS OF BREATH, Disp: 1080 mL, Rfl: 5 .  levocetirizine (XYZAL) 5 MG tablet, Take 5 mg by mouth every evening., Disp: , Rfl:  .  mometasone-formoterol (DULERA) 200-5 MCG/ACT AERO, Inhale 2 puffs into the lungs 2 (two) times daily., Disp: 3 Inhaler, Rfl: 3 .  predniSONE (DELTASONE) 20 MG tablet, Take 1 tablet (20 mg total) by mouth daily., Disp: 4 tablet, Rfl: 0 .  predniSONE (DELTASONE) 5 MG tablet, TAKE 1 TABLET(5 MG) BY MOUTH DAILY WITH BREAKFAST, Disp: 30 tablet, Rfl: 5 .  PROAIR HFA 108 (90 Base) MCG/ACT inhaler, INHALE 2 PUFFS BY MOUTH EVERY 6 HOURS AS NEEDED FOR WHEEZING OR SHORTNESS OF BREATH. NEED APPOINTMENT FOR FUTURE REFILLS, Disp: 25.5 g, Rfl: 6 .  QUEtiapine (SEROQUEL) 50 MG tablet, Take 1 tablet (50 mg total) by mouth at bedtime., Disp: 30 tablet, Rfl: 0 .  sertraline (ZOLOFT) 50 MG tablet, Take 50 mg by mouth daily., Disp: , Rfl:  .  SPIRIVA HANDIHALER 18 MCG inhalation capsule, PLACE 1 CAPSULE INTO  INHALER AND INHALE EVERY DAY, Disp: 90 capsule, Rfl: 1 .  SPIRIVA HANDIHALER 18 MCG inhalation capsule, INHALE CONTENTS OF ONE CAPSULE ONCE DAILY USING HANDIHALER, Disp: 90 capsule, Rfl: 0 .  SPIRIVA HANDIHALER 18 MCG inhalation capsule, INHALE CONTENTS OF 1 CAPSULE ONCE DAILY USING HANDIHALER, Disp: 90 capsule, Rfl: 3     ALLERGIES   Zithromax [azithromycin] and Penicillins     REVIEW OF SYSTEMS   Review of Systems  Constitutional: Negative for chills, fever, malaise/fatigue and weight loss.  HENT: Negative for congestion.   Respiratory: Positive for shortness of breath.  Negative for cough, hemoptysis, sputum production and wheezing.   Cardiovascular: Negative for chest pain, palpitations, orthopnea and leg swelling.  Gastrointestinal: Negative for heartburn, nausea and vomiting.  Psychiatric/Behavioral: Negative for depression. The patient is not nervous/anxious.   All other systems reviewed and are negative.   Ht 5\' 2"  (1.575 m)   BMI 25.79 kg/m    PHYSICAL EXAM   Physical Exam  Constitutional: She is oriented to person, place, and time. No distress.  Cardiovascular: Normal rate, regular rhythm and normal heart sounds.  No murmur heard. Pulmonary/Chest: Effort normal and breath sounds normal. No stridor. No respiratory distress. She has no wheezes. She has no rales.  Musculoskeletal: Normal range of motion.        General: No edema.  Neurological: She is alert and oriented to person, place, and time.  Skin: Skin is warm. She is not diaphoretic.  Psychiatric: She has a normal mood and affect.       ASSESSMENT/PLAN   58 year old African-American female seen for follow-up today for severe end-stage COPD Gold stage D with chronic hypoxic respiratory failure from COPD with deconditioned state  Patient uses and benefits from noninvasive ventilation at night and definitely prevents her from relapsing and has prevented recurrent admissions and has improved her quality of life patient needs and requires noninvasive ventilation to survive  End-stage COPD requiring BiPAP at night for survival Continue BiPAP settings as prescribed Continue oxygen therapy as prescribed Excellent compliance report   COPD severe Gold stage D Continue inhalers as prescribed Chronic prednisone therapy 5 mg daily Continue inhalers as prescribed Dulera Spiriva and albuterol as needed   Allergic rhinitis Flonase and Allegra  Chronic hypoxic respiratory failure from COPD Continue oxygen as prescribed Completed pulmonary rehab program She uses and benefits from  oxygen therapy needs this for survival    No signs of exacerbation at this time No indication for steroids or antibiotics at this time   Flu shot is up-to-date     Patient  satisfied with Plan of action and management. All questions answered Follow up in 3 months  Takota Cahalan Patricia Pesa, M.D.  [facility]  Medical Director [place] Director [facility] Cardio-Pulmonary Department

## 2018-08-05 ENCOUNTER — Telehealth: Payer: Self-pay | Admitting: Internal Medicine

## 2018-08-05 NOTE — Telephone Encounter (Signed)
Called patient for COVID-19 pre-screening for in office visit.  Have you recently traveled any where out of the local area in the last 2 weeks? No  Have you been in close contact with a person diagnosed with COVID-19 or someone awaiting results within the last 2 weeks? No  Do you currently have any of the following symptoms? If so, when did they start? Cough     Diarrhea   Joint Pain Fever      Muscle Pain   Red eyes Shortness of breath (Yes- COPD) Abdominal pain                        Vomiting Loss of smell    Rash    Sore Throat Headache    Weakness   Bruising or bleeding   Okay to proceed with visit 08/09/18

## 2018-08-09 ENCOUNTER — Inpatient Hospital Stay: Payer: Medicare Other | Admitting: Internal Medicine

## 2018-10-15 ENCOUNTER — Telehealth: Payer: Self-pay | Admitting: Internal Medicine

## 2018-10-15 MED ORDER — DULERA 200-5 MCG/ACT IN AERO: 2.0000 | INHALATION_SPRAY | Freq: Two times a day (BID) | RESPIRATORY_TRACT | 2 refills | Status: DC

## 2018-10-15 MED ORDER — SPIRIVA HANDIHALER 18 MCG IN CAPS: ORAL_CAPSULE | RESPIRATORY_TRACT | 1 refills | Status: DC

## 2018-10-15 MED ORDER — PREDNISONE 5 MG PO TABS: 5.0000 mg | ORAL_TABLET | Freq: Every day | ORAL | 0 refills | Status: DC

## 2018-10-15 NOTE — Telephone Encounter (Signed)
° °  [facility] - [address] , [place], [place]

## 2018-10-15 NOTE — Telephone Encounter (Signed)
pt was in ED yesterday and has an appt with DR. Mortimer Fries on 9/23 - but she will need a refill of meds before then - she will need prednoisne. SPIRIVA HANDIHALER, DULERA 200-5 MCG/ACT AERO - will need RX sent to [facility] [place], [place] [phone] - please advise pt - CB# [phone]

## 2018-10-15 NOTE — Telephone Encounter (Addendum)
Called and spoke to pt, who is requesting refill dulera, spiriva, and predisone 5mg . Pt will call back with phone number of pharmacy, as pharmacy could not be found in address by address.

## 2018-10-15 NOTE — Telephone Encounter (Signed)
Rx for dulera, spiriva, prednsione 5mg  has been sent to preferred pharmacy.  Pt is aware and voiced her understanding.

## 2018-10-27 ENCOUNTER — Other Ambulatory Visit: Payer: Self-pay

## 2018-10-27 ENCOUNTER — Encounter: Payer: Self-pay | Admitting: Internal Medicine

## 2018-10-27 ENCOUNTER — Ambulatory Visit (INDEPENDENT_AMBULATORY_CARE_PROVIDER_SITE_OTHER): Payer: Medicare HMO | Admitting: Internal Medicine

## 2018-10-27 VITALS — BP 128/82 | HR 117 | Temp 97.6°F | Ht 62.0 in | Wt 129.6 lb

## 2018-10-27 DIAGNOSIS — J441 Chronic obstructive pulmonary disease with (acute) exacerbation: Secondary | ICD-10-CM | POA: Diagnosis not present

## 2018-10-27 MED ORDER — PREDNISONE 20 MG PO TABS: 20.0000 mg | ORAL_TABLET | Freq: Every day | ORAL | 0 refills | Status: DC

## 2018-10-27 NOTE — Progress Notes (Signed)
[facility] Consultation     Date: 10/27/2018,   MRN# [id number] [name] [name] 11-26-1960  Her current pulmonary regimen is as follows: Dulera 200-5, 2 actuations BID Spiriva HH - once a day Prednisone 5  mg daily Albuterol MDI PRN Duoneb PRN BiPAP @ HS Last reported FEV1 in 2017 was 28% predicted Severe COPD    CHIEF COMPLAINT:  Follow-up COPD Follow-up chronic respiratory failure  F follow-up COPD ollow-up COPD   HISTORY OF PRESENT ILLNESS  Patient on BiPAP for end-stage COPD P recent PD  Recent visit [facility]  Was given prednisone therapy and azithromycin  Patient with wheezing shortness of breath increased dyspnea and exertion   Feels a little better now  Patient needs more prednisone  Still wheezing  Still short of breath   Continues to use oxygen 24/7 3 L nasal cannula Patient uses a benefits from oxygen therapy and needs this for survival  Patient on BiPAP for end-stage COPD  Chronic end-stage lung disease on chronic oxygen therapy for COPD and hypoxic respiratory failure  Uses and benefits from oxygen and BiPAP therapy and needs this for survival   BiPAP as needed for survival and prevention of COPD exacerbations to the hospital patient on BiPAP for end-stage COPD    Current Medication:   Current Outpatient Medications:  .  aspirin 81 MG EC tablet, Take 1 tablet (81 mg total) by mouth daily., Disp: 30 tablet, Rfl: 1 .  cetirizine (ZYRTEC ALLERGY) 10 MG tablet, Take 1 tablet (10 mg total) by mouth daily., Disp: 90 tablet, Rfl: 3 .  cetirizine (ZYRTEC ALLERGY) 10 MG tablet, Take 1 tablet (10 mg total) by mouth daily., Disp: 30 tablet, Rfl: 6 .  DULERA 200-5 MCG/ACT AERO, INHALE 2 PUFFS INTO LUNGS TWICE DAILY, Disp: 39 g, Rfl: 3 .  fluticasone (FLONASE) 50 MCG/ACT nasal spray, Place 1 spray into both nostrils daily., Disp: 1 g, Rfl: 5 .  guaiFENesin (MUCINEX) 600 MG 12 hr tablet, Take 1 tablet (600 mg total) by mouth 2  (two) times daily as needed for cough or to loosen phlegm., Disp: 20 tablet, Rfl: 0 .  ipratropium-albuterol (DUONEB) 0.5-2.5 (3) MG/3ML SOLN, USE 3 ML VIA NEBULIZER EVERY 4 HOURS AS NEEDED FOR WHEEZING OR SHORTNESS OF BREATH, Disp: 1080 mL, Rfl: 5 .  levocetirizine (XYZAL) 5 MG tablet, Take 5 mg by mouth every evening., Disp: , Rfl:  .  LORazepam (ATIVAN) 0.5 MG tablet, Take 0.5 mg by mouth 2 (two) times daily., Disp: , Rfl:  .  mometasone-formoterol (DULERA) 200-5 MCG/ACT AERO, Inhale 2 puffs into the lungs 2 (two) times daily., Disp: 13 g, Rfl: 2 .  predniSONE (DELTASONE) 5 MG tablet, Take 1 tablet (5 mg total) by mouth daily with breakfast., Disp: 30 tablet, Rfl: 0 .  PROAIR HFA 108 (90 Base) MCG/ACT inhaler, INHALE 2 PUFFS BY MOUTH EVERY 6 HOURS AS NEEDED FOR WHEEZING OR SHORTNESS OF BREATH. NEED APPOINTMENT FOR FUTURE REFILLS, Disp: 25.5 g, Rfl: 6 .  sertraline (ZOLOFT) 50 MG tablet, Take 100 mg by mouth daily. , Disp: , Rfl:  .  tiotropium (SPIRIVA HANDIHALER) 18 MCG inhalation capsule, PLACE 1 CAPSULE INTO INHALER AND INHALE EVERY DAY, Disp: 90 capsule, Rfl: 1     ALLERGIES   Zithromax [azithromycin] and Penicillins     REVIEW OF SYSTEMS   Review of Systems  Constitutional: Negative for chills, fever, malaise/fatigue and weight loss.  HENT: Negative for congestion.   Respiratory: Positive for  shortness of breath. Negative for cough, hemoptysis, sputum production and wheezing.   Cardiovascular: Negative for chest pain, palpitations, orthopnea and leg swelling.  Gastrointestinal: Negative for heartburn, nausea and vomiting.  Psychiatric/Behavioral: Negative for depression. The patient is not nervous/anxious.   All other systems reviewed and are negative.   BP 128/82   Pulse (!) 117   Temp 97.6 F (36.4 C) (Temporal)   Ht 5\' 2"  (1.575 m)   Wt 129 lb 9.6 oz (58.8 kg)   SpO2 94%   BMI 23.70 kg/m   Physical Examination:   GENERAL:NAD, no fevers, chills, no weakness no  fatigue HEAD: Normocephalic, atraumatic.  EYES: PERLA, EOMI No scleral icterus.  MOUTH: Moist mucosal membrane.  EAR, NOSE, THROAT: Clear without exudates. No external lesions.  NECK: Supple. No thyromegaly.  No JVD.  PULMONARY: CTA B/L no wheezing, rhonchi, crackles CARDIOVASCULAR: S1 and S2. Regular rate and rhythm. No murmurs GASTROINTESTINAL: Soft, nontender, nondistended. Positive bowel sounds.  MUSCULOSKELETAL: No swelling, clubbing, or edema.  NEUROLOGIC: No gross focal neurological deficits. 5/5 strength all extremities SKIN: No ulceration, lesions, rashes, or cyanosis.  PSYCHIATRIC: Insight, judgment intact. -depression -anxiety ALL OTHER ROS ARE NEGATIVE          ASSESSMENT/PLAN   58 year old pleasant African-American female follow-up today for severe end-stage COPD Gold stage D with chronic hypoxic respiratory failure and chronic hypercapnic respiratory failure from COPD with deconditioned state on chronic BiPAP therapy and oxygen therapy  Patient uses and benefits from noninvasive ventilation at night and definitely prevents her from relapsing and has prevented recurrent admissions and has improved her quality of life patient needs and requires noninvasive ventilation to survive  End stage COPD Continue BiPAP at night for survival Continue BiPAP as needed during the day Continue BiPAP settings as prescribed Continue oxygen as prescribed  COPD severe Gold stage D Continue inhalers as prescribed Patient currently with COPD exacerbation Prednisone 20 mg for 10 days Continue Dulera and Spiriva albuterol as prescribed    Chronic hypoxic respiratory failure from COPD Continue oxygen as prescribed She benefits and uses oxygen therapy and needs this for survival   COVID-19 EDUCATION: The signs and symptoms of COVID-19 were discussed with the patient and how to seek care for testing.  The importance of social distancing was discussed today. Hand Washing Techniques  and avoid touching face was advised.  MEDICATION ADJUSTMENTS/LABS AND TESTS ORDERED: OBTAIN NEW NEBULIZER MACHINE   RECOMMEND DOUNEBS EVERY 4 HRS for next 3 days  Continue BIPAP and oxygen as prescribed  PREDNISONE 20 mg daily for 10 days    CURRENT MEDICATIONS REVIEWED AT LENGTH WITH PATIENT TODAY   Patient satisfied with Plan of action and management. All questions answered  Follow up in 6 months   Antwain Caliendo Patricia Pesa, M.D.  [facility]  Medical Director [place] Director [facility] Cardio-Pulmonary Department

## 2018-10-27 NOTE — Patient Instructions (Addendum)
OBTAIN NEW NEBULIZER MACHINE   RECOMMEND DOUNEBS EVERY 4 HRS for next 3 days  Continue BIPAP and oxygen as prescribed  PREDNISONE 20 mg daily for 10 days

## 2018-11-02 ENCOUNTER — Telehealth: Payer: Self-pay | Admitting: Internal Medicine

## 2018-11-02 NOTE — Telephone Encounter (Signed)
Left message for pt

## 2018-11-04 NOTE — Telephone Encounter (Signed)
Pt is aware of recommendations and voiced her understanding.  Nothing further is needed.  

## 2018-11-04 NOTE — Telephone Encounter (Signed)
Per pt's home nurse, pt has been experiencing increased sob and irregular heartbeat. No hx of afib.  Spoke to pt for update. Pt stated that she is baseline and denied any additional symptoms.  DK please advise. Thanks

## 2018-11-04 NOTE — Telephone Encounter (Signed)
Needs to go to ER for further work up

## 2018-11-05 ENCOUNTER — Telehealth: Payer: Self-pay | Admitting: Internal Medicine

## 2018-11-05 NOTE — Telephone Encounter (Signed)
Called and spoke Parul with [facility], who is requesting verbal to continue COPD management.   DK please advise on verbal. Thanks

## 2018-11-09 NOTE — Telephone Encounter (Signed)
Yes please

## 2018-11-09 NOTE — Telephone Encounter (Signed)
Lm for [name] [name]

## 2018-11-11 NOTE — Telephone Encounter (Signed)
Left message x2 for Parul.

## 2018-11-12 NOTE — Telephone Encounter (Signed)
Lm for [name] [name].

## 2018-11-12 NOTE — Telephone Encounter (Signed)
[name] [name] is aware of verbal. Nothing further is needed.

## 2018-11-26 ENCOUNTER — Telehealth: Payer: Self-pay | Admitting: Internal Medicine

## 2018-11-26 NOTE — Telephone Encounter (Signed)
Spoke to [name] [name] with [name] [name]. [name] [name] stated that [name] Finner, NP suggested that pt be on daily steroid, as pt has been having copd flares and ED visits within the past month.  Spoke to pt and scheduled OV for 12/08/2018.  DK please advise if you have any recommendations in the meantime until appt?

## 2018-11-26 NOTE — Telephone Encounter (Signed)
Left message for pt

## 2018-11-26 NOTE — Telephone Encounter (Signed)
Prednisone 20 mg daily

## 2018-12-02 MED ORDER — PREDNISONE 20 MG PO TABS: 20.0000 mg | ORAL_TABLET | Freq: Every day | ORAL | 0 refills | Status: AC

## 2018-12-02 NOTE — Telephone Encounter (Signed)
Pt is aware of below recommendations and voiced her understanding. Rx for prednisone 20mg  has been sent to preferred pharmacy.  Nothing further is needed.

## 2018-12-08 ENCOUNTER — Other Ambulatory Visit: Payer: Self-pay

## 2018-12-08 ENCOUNTER — Emergency Department: Payer: Medicare HMO

## 2018-12-08 ENCOUNTER — Encounter: Payer: Self-pay | Admitting: Internal Medicine

## 2018-12-08 ENCOUNTER — Emergency Department
Admission: EM | Admit: 2018-12-08 | Discharge: 2018-12-09 | Disposition: A | Discharge status: Home / Self Care | Payer: Medicare HMO | Attending: Emergency Medicine | Admitting: Emergency Medicine

## 2018-12-08 ENCOUNTER — Ambulatory Visit (INDEPENDENT_AMBULATORY_CARE_PROVIDER_SITE_OTHER): Payer: Medicare HMO | Admitting: Internal Medicine

## 2018-12-08 VITALS — BP 124/72 | HR 87 | Temp 97.7°F

## 2018-12-08 DIAGNOSIS — J441 Chronic obstructive pulmonary disease with (acute) exacerbation: Secondary | ICD-10-CM

## 2018-12-08 DIAGNOSIS — R0602 Shortness of breath: Secondary | ICD-10-CM | POA: Diagnosis present

## 2018-12-08 DIAGNOSIS — J9610 Chronic respiratory failure, unspecified whether with hypoxia or hypercapnia: Secondary | ICD-10-CM

## 2018-12-08 DIAGNOSIS — Z87891 Personal history of nicotine dependence: Secondary | ICD-10-CM | POA: Insufficient documentation

## 2018-12-08 DIAGNOSIS — Z853 Personal history of malignant neoplasm of breast: Secondary | ICD-10-CM | POA: Diagnosis not present

## 2018-12-08 DIAGNOSIS — Z7982 Long term (current) use of aspirin: Secondary | ICD-10-CM | POA: Diagnosis not present

## 2018-12-08 LAB — CBC
HCT: 39 % (ref 36.0–46.0)
Hemoglobin: 12.1 g/dL (ref 12.0–15.0)
MCH: 26.7 pg (ref 26.0–34.0)
MCHC: 31 g/dL (ref 30.0–36.0)
MCV: 86.1 fL (ref 80.0–100.0)
Platelets: 292 10*3/uL (ref 150–400)
RBC: 4.53 MIL/uL (ref 3.87–5.11)
RDW: 12.2 % (ref 11.5–15.5)
WBC: 11.5 10*3/uL — ABNORMAL HIGH (ref 4.0–10.5)
nRBC: 0 % (ref 0.0–0.2)

## 2018-12-08 LAB — BASIC METABOLIC PANEL
Anion gap: 9 (ref 5–15)
BUN: 20 mg/dL (ref 6–20)
CO2: 27 mmol/L (ref 22–32)
Calcium: 8.9 mg/dL (ref 8.9–10.3)
Chloride: 102 mmol/L (ref 98–111)
Creatinine, Ser: 0.78 mg/dL (ref 0.44–1.00)
GFR calc Af Amer: >60 mL/min (ref >60)
GFR calc non Af Amer: >60 mL/min (ref >60)
Glucose, Bld: 100 mg/dL — ABNORMAL HIGH (ref 70–99)
Potassium: 3.6 mmol/L (ref 3.5–5.1)
Sodium: 138 mmol/L (ref 135–145)

## 2018-12-08 LAB — TROPONIN I (HIGH SENSITIVITY): Troponin I (High Sensitivity): 4 ng/L (ref <18)

## 2018-12-08 LAB — BRAIN NATRIURETIC PEPTIDE: B Natriuretic Peptide: 16 pg/mL (ref 0.0–100.0)

## 2018-12-08 MED ORDER — ALBUTEROL SULFATE (2.5 MG/3ML) 0.083% IN NEBU
5.0000 mg | INHALATION_SOLUTION | Freq: Once | RESPIRATORY_TRACT | Status: AC
  Administered 2018-12-08: 5 mg via RESPIRATORY_TRACT
  Filled 2018-12-08: qty 6

## 2018-12-08 MED ORDER — PREDNISONE 20 MG PO TABS
60.0000 mg | ORAL_TABLET | Freq: Once | ORAL | Status: AC
  Administered 2018-12-08: 60 mg via ORAL
  Filled 2018-12-08: qty 3

## 2018-12-08 NOTE — Patient Instructions (Addendum)
Continue inhalers as prescribed Continue oxygen as prescribed Continue noninvasive ventilation for chronic hypoxic and hypercapnic respiratory failure  PATIENT NEEDS TO BE SEEN IN ER AND ADMITTED FOR AGGRESSIVE BIPAP THERAPY  PATIENT TO DECIDE ON WHICH ER TO GO

## 2018-12-08 NOTE — ED Provider Notes (Signed)
[facility] Emergency Department Provider Note   ____________________________________________   First MD Initiated Contact with Patient 12/08/18 2131     (approximate)  I have reviewed the triage vital signs and the nursing notes.   HISTORY  Chief Complaint Shortness of Breath    HPI [name] [name] is a 58 y.o. female with past medical history of COPD on 2 L nasal cannula and breast cancer who presents to the ED complaining of shortness of breath.  Patient reports she has had increasing shortness of breath over the past couple of days on top of gradually worsening shortness of breath over the past month.  She states she has been on and off steroids for the past month for COPD exacerbations, but has not had much improvement in her symptoms.  She denies any fevers, cough, or chest pain.  She is also not noticed any pain or swelling in her legs.  She was evaluated at her pulmonologist office earlier today, when she was found to be extremely short of breath and was sent to the ED to potentially be put on BiPAP.  Patient states she will get very out of breath with minimal exertion and can barely get out of bed at this point.        Past Medical History:  Diagnosis Date  . Breast cancer ([place]) 2011   BILAT  lumpectomy 2011  . Cancer ([place])   . COPD (chronic obstructive pulmonary disease) ([place])   . Personal history of radiation therapy 2011   fu bil. breast cancer    Patient Active Problem List   Diagnosis Date Noted  . Acute on chronic respiratory failure with hypoxia ([place]) 01/04/2016  . Leukocytosis 01/04/2016  . COPD (chronic obstructive pulmonary disease) ([place]) 01/04/2016  . History of breast cancer 11/13/2015  . Shingles 11/13/2015  . Persistent depressive disorder 11/11/2015  . PTSD (post-traumatic stress disorder) 11/11/2015  . Respiratory failure with hypercapnia ([place]) 10/31/2015  . Anxiety 05/06/2015  . Smoker 05/06/2015  . Emphysema lung  ([place]) 05/06/2015  . Acute on chronic respiratory failure ([place]) 07/15/2014  . COPD exacerbation ([place]) 07/15/2014  . Acute bronchitis 07/15/2014  . Tachycardia 07/15/2014    Past Surgical History:  Procedure Laterality Date  . ABDOMINAL HYSTERECTOMY    . BREAST BIOPSY Left 2011   positive  . BREAST BIOPSY Right 2011   positive  . BREAST LUMPECTOMY Bilateral 2011    Prior to Admission medications   Medication Sig Start Date End Date Taking? Authorizing Provider  aspirin 81 MG EC tablet Take 1 tablet (81 mg total) by mouth daily. 05/14/15   Demetrios Loll, MD  cetirizine (ZYRTEC ALLERGY) 10 MG tablet Take 1 tablet (10 mg total) by mouth daily. 04/12/18   Flora Lipps, MD  DULERA 200-5 MCG/ACT AERO INHALE 2 PUFFS INTO LUNGS TWICE DAILY 01/28/18   Flora Lipps, MD  fluticasone (FLONASE) 50 MCG/ACT nasal spray Place 1 spray into both nostrils daily. 01/06/17 01/06/18  Flora Lipps, MD  guaiFENesin (MUCINEX) 600 MG 12 hr tablet Take 1 tablet (600 mg total) by mouth 2 (two) times daily as needed for cough or to loosen phlegm. 01/06/17   Flora Lipps, MD  ipratropium-albuterol (DUONEB) 0.5-2.5 (3) MG/3ML SOLN USE 3 ML VIA NEBULIZER EVERY 4 HOURS AS NEEDED FOR WHEEZING OR SHORTNESS OF BREATH 01/06/17   Flora Lipps, MD  levocetirizine (XYZAL) 5 MG tablet Take 5 mg by mouth every evening.    [provider]  LORazepam (ATIVAN) 0.5  MG tablet Take 0.5 mg by mouth 2 (two) times daily.    [provider]  mometasone-formoterol (DULERA) 200-5 MCG/ACT AERO Inhale 2 puffs into the lungs 2 (two) times daily. 10/15/18   Flora Lipps, MD  predniSONE (DELTASONE) 20 MG tablet Take 1 tablet (20 mg total) by mouth daily. 12/02/18 12/02/19  Flora Lipps, MD  PROAIR HFA 108 (90 Base) MCG/ACT inhaler INHALE 2 PUFFS BY MOUTH EVERY 6 HOURS AS NEEDED FOR WHEEZING OR SHORTNESS OF BREATH. NEED APPOINTMENT FOR FUTURE REFILLS 01/28/18   Flora Lipps, MD  sertraline (ZOLOFT) 50 MG tablet Take 100 mg by mouth daily.   10/27/17   [provider]  tiotropium (SPIRIVA HANDIHALER) 18 MCG inhalation capsule PLACE 1 CAPSULE INTO INHALER AND INHALE EVERY DAY 10/15/18   Flora Lipps, MD    Allergies Zithromax [azithromycin] and Penicillins  Family History  Problem Relation Age of Onset  . Breast cancer Mother   . Lung cancer Mother     Social History Social History   Tobacco Use  . Smoking status: Former Smoker    Packs/day: 0.50    Years: 40.00    Pack years: 20.00  . Smokeless tobacco: Never Used  . Tobacco comment: quit smoking 09/26/15  Substance Use Topics  . Alcohol use: No  . Drug use: No    Review of Systems  Constitutional: No fever/chills Eyes: No visual changes. ENT: No sore throat. Cardiovascular: Denies chest pain. Respiratory: Positive for shortness of breath. Gastrointestinal: No abdominal pain.  No nausea, no vomiting.  No diarrhea.  No constipation. Genitourinary: Negative for dysuria. Musculoskeletal: Negative for back pain. Skin: Negative for rash. Neurological: Negative for headaches, focal weakness or numbness.  ____________________________________________   PHYSICAL EXAM:  VITAL SIGNS: ED Triage Vitals  Enc Vitals Group     BP 12/08/18 1646 111/89     Pulse Rate 12/08/18 1646 (!) 47     Resp 12/08/18 1646 (!) 30     Temp 12/08/18 1651 97.7 F (36.5 C)     Temp Source 12/08/18 1646 Oral     SpO2 12/08/18 1646 99 %     Weight 12/08/18 1647 130 lb (59 kg)     Height 12/08/18 1647 5\' 2"  (1.575 m)     Head Circumference --      Peak Flow --      Pain Score 12/08/18 1647 0     Pain Loc --      Pain Edu? --      Excl. in [place]? --     Constitutional: Alert and oriented. Eyes: Conjunctivae are normal. Head: Atraumatic. Nose: No congestion/rhinnorhea. Mouth/Throat: Mucous membranes are moist. Neck: Normal ROM Cardiovascular: Normal rate, regular rhythm. Grossly normal heart sounds. Respiratory: Tachypneic.  No retractions.  Inspiratory and expiratory  wheezing, poor air movement. Gastrointestinal: Soft and nontender. No distention. Genitourinary: deferred Musculoskeletal: No lower extremity tenderness nor edema. Neurologic:  Normal speech and language. No gross focal neurologic deficits are appreciated. Skin:  Skin is warm, dry and intact. No rash noted. Psychiatric: Mood and affect are normal. Speech and behavior are normal.  ____________________________________________   LABS (all labs ordered are listed, but only abnormal results are displayed)  Labs Reviewed  BASIC METABOLIC PANEL - Abnormal; Notable for the following components:      Result Value   Glucose, Bld 100 (*)    All other components within normal limits  CBC - Abnormal; Notable for the following components:   WBC 11.5 (*)  All other components within normal limits  BRAIN NATRIURETIC PEPTIDE  FIBRIN DERIVATIVES D-DIMER ([facility] ONLY)  TROPONIN I (HIGH SENSITIVITY)   ____________________________________________  EKG  ED ECG REPORT I, Blake Divine, the attending physician, personally viewed and interpreted this ECG.   Date: 12/08/2018  EKG Time: 16:53  Rate: 88  Rhythm: normal sinus rhythm  Axis: Normal  Intervals:none  ST&T Change: None   PROCEDURES  Procedure(s) performed (including Critical Care):  Procedures   ____________________________________________   INITIAL IMPRESSION / ASSESSMENT AND PLAN / ED COURSE       58 year old female with history of advanced COPD presents to the ED with worsening shortness of breath over the past month and acutely worse over the past couple of days, especially with exertion.  She has not had any associated chest pain and EKG shows no acute ischemic changes, troponin within normal limits, doubt ACS.  She does have wheezing on exam consistent with COPD, will treat with steroids and breathing treatments.  Given she has a remote history of breast cancer and has not seemed to improve over the past month with  steroids and albuterol, will screen D-dimer.  Patient is low risk for PE and if this is negative, doubt PE.  She was apparently extremely short of breath at her pulmonologist office earlier today, who was concerned that she might need BiPAP.  She is in no respiratory distress currently but continues to be tachypneic.  She will likely require admission for further management of COPD given her oxygen requirement has increased to 3 L and her functional capacity is extremely limited due to her difficulty breathing.  Patient turned over to Dr. Beather Arbour pending d-dimer and reevaluation, plan for admission for COPD if negative.      ____________________________________________   FINAL CLINICAL IMPRESSION(S) / ED DIAGNOSES  Final diagnoses:  COPD exacerbation ([place])  Shortness of breath     ED Discharge Orders    None       Note:  This document was prepared using Dragon voice recognition software and may include unintentional dictation errors.   Blake Divine, MD 12/08/18 [phone]

## 2018-12-08 NOTE — ED Triage Notes (Signed)
Pt brought up from the [facility] clinic with increased SOB and work of breathing over the past couple of days with a Hx of COPD. States she is normally on 2L [place] but is currently on 3L, 99% but is tachypneic on arrival with use of accessory muscles.

## 2018-12-08 NOTE — ED Notes (Signed)
X-ray at bedside

## 2018-12-08 NOTE — Progress Notes (Signed)
[place] [facility] Consultation     Date: 12/08/2018,   MRN# [id number] [name] [name] 1960/07/17  Her current pulmonary regimen is as follows: Dulera 200-5, 2 actuations BID Spiriva HH - once a day Prednisone 5  mg daily Albuterol MDI PRN Duoneb PRN BiPAP @ HS Last reported FEV1 in 2017 was 28% predicted Severe COPD    CHIEF COMPLAINT:   Follow-up COPD Follow-up chronic hypoxic respiratory failure Follow-up chronic hypercapnic respiratory failure  F follow-up COPD ollow-up COPD   HISTORY OF PRESENT ILLNESS  Patient on BiPAP for end-stage COPD Patient  recent PD Patient is very SOB, using accessory muscles to breathe, patient wanst to think about which ER to go to. I advised her she needs to decide very quickly  she should call 911 if she gets worse  She HAS REFUSED TO GO TO ER HERE AT [facility]    Patient with chronic shortness of breath and dyspnea exertion Progressive over the last several months Patient with very poor respiratory insufficiency  Patient had COPD exacerbation approximately 6 weeks ago Currently on prednisone therapy at 20 mg  Continue to use oxygen 24/7 3 L nasal cannula Patient is a benefits from oxygen therapy and noninvasive ventilation She needs this for survival  Patient is noninvasive ventilation for end-stage COPD Chronic end-stage lung disease on chronic hypoxic respiratory failure   PATIENT NEEDS TO BE SEEN IN ER AND ADMITTED FOR AGGRESSIVE BIPAP THERAPY  PATIENT TO DECIDE ON WHICH ER TO GO  Current Medication:   Current Outpatient Medications:  .  aspirin 81 MG EC tablet, Take 1 tablet (81 mg total) by mouth daily., Disp: 30 tablet, Rfl: 1 .  cetirizine (ZYRTEC ALLERGY) 10 MG tablet, Take 1 tablet (10 mg total) by mouth daily., Disp: 90 tablet, Rfl: 3 .  cetirizine (ZYRTEC ALLERGY) 10 MG tablet, Take 1 tablet (10 mg total) by mouth daily., Disp: 30 tablet, Rfl: 6 .  DULERA 200-5 MCG/ACT AERO, INHALE 2 PUFFS INTO  LUNGS TWICE DAILY, Disp: 39 g, Rfl: 3 .  fluticasone (FLONASE) 50 MCG/ACT nasal spray, Place 1 spray into both nostrils daily., Disp: 1 g, Rfl: 5 .  guaiFENesin (MUCINEX) 600 MG 12 hr tablet, Take 1 tablet (600 mg total) by mouth 2 (two) times daily as needed for cough or to loosen phlegm., Disp: 20 tablet, Rfl: 0 .  ipratropium-albuterol (DUONEB) 0.5-2.5 (3) MG/3ML SOLN, USE 3 ML VIA NEBULIZER EVERY 4 HOURS AS NEEDED FOR WHEEZING OR SHORTNESS OF BREATH, Disp: 1080 mL, Rfl: 5 .  levocetirizine (XYZAL) 5 MG tablet, Take 5 mg by mouth every evening., Disp: , Rfl:  .  LORazepam (ATIVAN) 0.5 MG tablet, Take 0.5 mg by mouth 2 (two) times daily., Disp: , Rfl:  .  mometasone-formoterol (DULERA) 200-5 MCG/ACT AERO, Inhale 2 puffs into the lungs 2 (two) times daily., Disp: 13 g, Rfl: 2 .  predniSONE (DELTASONE) 20 MG tablet, Take 1 tablet (20 mg total) by mouth daily with breakfast. 10 days, Disp: 20 tablet, Rfl: 0 .  predniSONE (DELTASONE) 20 MG tablet, Take 1 tablet (20 mg total) by mouth daily., Disp: 30 tablet, Rfl: 0 .  predniSONE (DELTASONE) 5 MG tablet, Take 1 tablet (5 mg total) by mouth daily with breakfast., Disp: 30 tablet, Rfl: 0 .  PROAIR HFA 108 (90 Base) MCG/ACT inhaler, INHALE 2 PUFFS BY MOUTH EVERY 6 HOURS AS NEEDED FOR WHEEZING OR SHORTNESS OF BREATH. NEED APPOINTMENT FOR FUTURE REFILLS, Disp: 25.5 g, Rfl: 6 .  sertraline (  ZOLOFT) 50 MG tablet, Take 100 mg by mouth daily. , Disp: , Rfl:  .  tiotropium (SPIRIVA HANDIHALER) 18 MCG inhalation capsule, PLACE 1 CAPSULE INTO INHALER AND INHALE EVERY DAY, Disp: 90 capsule, Rfl: 1     ALLERGIES   Zithromax [azithromycin] and Penicillins     REVIEW OF SYSTEMS   Review of Systems  Constitutional: Negative for chills, fever, malaise/fatigue and weight loss.       Resp distress, increased WOB  HENT: Negative for congestion.   Respiratory: Positive for shortness of breath. Negative for cough, hemoptysis, sputum production and wheezing.    Cardiovascular: Negative for chest pain, palpitations, orthopnea and leg swelling.  Gastrointestinal: Negative for heartburn, nausea and vomiting.  Psychiatric/Behavioral: Negative for depression. The patient is not nervous/anxious.   All other systems reviewed and are negative.  BP 124/72 (BP Location: Left Arm, Cuff Size: Normal)   Pulse 87   Temp 97.7 F (36.5 C) (Temporal)   SpO2 95% Comment: 3lpm pulsed o2  Physical Examination:   GENERAL:NAD, no fevers, chills, no weakness no fatigue HEAD: Normocephalic, atraumatic.  EYES: PERLA, EOMI No scleral icterus.  MOUTH: Moist mucosal membrane.  EAR, NOSE, THROAT: Clear without exudates. No external lesions.  NECK: Supple. No thyromegaly.  No JVD.  PULMONARY: CTA B/L no wheezing, rhonchi, crackles CARDIOVASCULAR: S1 and S2. Regular rate and rhythm. No murmurs GASTROINTESTINAL: Soft, nontender, nondistended. Positive bowel sounds.  MUSCULOSKELETAL: No swelling, clubbing, or edema.  NEUROLOGIC: No gross focal neurological deficits. 5/5 strength all extremities SKIN: No ulceration, lesions, rashes, or cyanosis.  PSYCHIATRIC: Insight, judgment intact. -depression -anxiety ALL OTHER ROS ARE NEGATIVE           ASSESSMENT/PLAN   Severe end-stage COPD Gold stage D with chronic hypoxic respiratory failure and chronic hypercapnic respiratory failure with deconditioned state on chronic noninvasive ventilation therapy and oxygen therapy  Patient uses and benefits from noninvasive ventilation at night and definitely prevents her from relapsing and has prevented multiple recurrent admissions and has improved her quality of life Patient needs this and requires noninvasive ventilation to survive   End-stage COPD  Continue noninvasive ventilation at night and as needed Continue oxygen as prescribed  COPD severe Gold stage D Continue inhalers as prescribed At some point in time patient may need scheduled nebulized therapy Patient has  very poor respiratory insufficiency Continue Dulera and Spiriva as prescribed  Chronic hypoxic respiratory failure from COPD Continue oxygen as prescribed Patient uses and benefits from oxygen therapy needs this for survival  SEVERE COPD EXACERBATION PATIENT MAY NEED HIGHER BIPAP PRESSURES for INCREASE WOB AND AIRTRAPPING  COVID-19 EDUCATION: The signs and symptoms of COVID-19 were discussed with the patient and how to seek care for testing.  The importance of social distancing was discussed today. Hand Washing Techniques and avoid touching face was advised.     MEDICATION ADJUSTMENTS/LABS AND TESTS ORDERED: Continue inhalers as prescribed Continue oxygen as prescribed Continue noninvasive ventilation for chronic hypoxic and hypercapnic respiratory failure  PATIENT NEEDS TO BE SEEN IN ER AND ADMITTED FOR AGGRESSIVE BIPAP THERAPY  PATIENT TO DECIDE ON WHICH ER TO GO    CURRENT MEDICATIONS REVIEWED AT LENGTH WITH PATIENT TODAY   Patient satisfied with Plan of action and management. All questions answered  Follow up in 3 months   Cayde Held Patricia Pesa, M.D.  [facility]  Medical Director [place] Director [facility] Cardio-Pulmonary Department

## 2018-12-09 DIAGNOSIS — J441 Chronic obstructive pulmonary disease with (acute) exacerbation: Secondary | ICD-10-CM | POA: Diagnosis not present

## 2018-12-09 LAB — FIBRIN DERIVATIVES D-DIMER (ARMC ONLY): Fibrin derivatives D-dimer (ARMC): 272.04 ng/mL (FEU) (ref 0.00–499.00)

## 2018-12-09 LAB — TROPONIN I (HIGH SENSITIVITY): Troponin I (High Sensitivity): 3 ng/L (ref <18)

## 2018-12-09 MED ORDER — IPRATROPIUM-ALBUTEROL 0.5-2.5 (3) MG/3ML IN SOLN
3.0000 mL | Freq: Once | RESPIRATORY_TRACT | Status: AC
  Administered 2018-12-09: 3 mL via RESPIRATORY_TRACT
  Filled 2018-12-09: qty 3

## 2018-12-09 NOTE — ED Notes (Signed)
Lab at bedside to collect blue top

## 2018-12-09 NOTE — Discharge Instructions (Addendum)
Continue prednisone as directed by your lung doctor.  Use inhaler/nebulizer as directed.  Return to the ER for worsening symptoms, persistent vomiting, difficulty breathing or if you change your mind about hospitalization.

## 2018-12-09 NOTE — ED Provider Notes (Signed)
-----------------------------------------   12:35 AM on 12/09/2018 -----------------------------------------  Lab had to draw D-dimer which is pending.  Patient states she is overall feeling better and does not wish to stay for admission.  She is mildly tachypneic with residual wheezing.  Will administer DuoNeb while we wait for D-dimer results.   ----------------------------------------- 1:42 AM on 12/09/2018 -----------------------------------------  Patient improved and feeling better after DuoNeb.  D-dimer unremarkable.  She still desires to be discharged home.  Is currently on prednisone.  I did encourage her to stay because her pulmonologist Dr. Mortimer Fries sent her here for admission.  Her daughter is at bedside.  She tells me she will contact her pulmonologist in the morning.  Strict return precautions given.  Patient and daughter verbalized understanding agree with plan of care.   Paulette Blanch, MD 12/09/18 [phone]

## 2018-12-13 ENCOUNTER — Other Ambulatory Visit: Payer: Self-pay | Admitting: Primary Care

## 2018-12-13 DIAGNOSIS — Z1231 Encounter for screening mammogram for malignant neoplasm of breast: Secondary | ICD-10-CM

## 2018-12-28 ENCOUNTER — Other Ambulatory Visit: Payer: Self-pay

## 2018-12-28 MED ORDER — DULERA 200-5 MCG/ACT IN AERO: 2.0000 | INHALATION_SPRAY | Freq: Two times a day (BID) | RESPIRATORY_TRACT | 2 refills | Status: DC

## 2018-12-29 ENCOUNTER — Telehealth: Payer: Self-pay | Admitting: Internal Medicine

## 2018-12-29 MED ORDER — ADVAIR HFA 230-21 MCG/ACT IN AERO: 2.0000 | INHALATION_SPRAY | Freq: Two times a day (BID) | RESPIRATORY_TRACT | 12 refills | Status: DC

## 2018-12-29 MED ORDER — SPIRIVA HANDIHALER 18 MCG IN CAPS: ORAL_CAPSULE | RESPIRATORY_TRACT | 1 refills | Status: DC

## 2018-12-29 NOTE — Addendum Note (Signed)
Addended by: Maryanna Shape A on: 12/29/2018 10:30 AM   Modules accepted: Orders

## 2018-12-29 NOTE — Telephone Encounter (Signed)
Received PA request from [facility] for [facility]. Covered alternatives are Breo and Advair.  DK please advise. Thanks

## 2018-12-29 NOTE — Telephone Encounter (Signed)
advair HFA 230

## 2018-12-29 NOTE — Telephone Encounter (Signed)
Please see duplicate phone encounter dated at 12/29/2018

## 2018-12-29 NOTE — Telephone Encounter (Signed)
Pt is aware of recommendations and voiced her understanding.  Rx for advair and spiriva has been sent to preferred pharmacy. (confirmed with Dr. Patsey Berthold that these can be taking together). Nothing further is needed.

## 2018-12-29 NOTE — Telephone Encounter (Signed)
  [name] [name], [name] [name] [id number]  LongVelna Hatchet M 13 minutes ago (8:36 AM)   Pt is out of medication Dulera and Spireva. Pt states that the insurance has to authorize the prescription before they will pay for it. The number for the insurance is [phone].     Left message for pt.  Just an FYI, pt is also on Spiriva. Should see continue this or discontinue?

## 2019-02-25 ENCOUNTER — Other Ambulatory Visit: Payer: Self-pay

## 2019-02-25 MED ORDER — PREDNISONE 5 MG PO TABS: 5.0000 mg | ORAL_TABLET | Freq: Every day | ORAL | 1 refills | Status: AC

## 2019-03-04 ENCOUNTER — Other Ambulatory Visit: Payer: Self-pay

## 2019-03-07 ENCOUNTER — Other Ambulatory Visit: Payer: Self-pay

## 2019-03-07 MED ORDER — ADVAIR HFA 230-21 MCG/ACT IN AERO: 2.0000 | INHALATION_SPRAY | Freq: Two times a day (BID) | RESPIRATORY_TRACT | 1 refills | Status: DC

## 2019-03-11 ENCOUNTER — Telehealth: Payer: Self-pay

## 2019-03-11 NOTE — Telephone Encounter (Signed)
[name] [name] from [place] called and wanted to give a recommendation for the non invasive trilogy vent for this patient.  He stated her CO2 was high on bipap and her respiratory rate was 28 on bipap.

## 2019-03-14 NOTE — Telephone Encounter (Signed)
Please proceed with recommendations as per Caryl Pina from [place]

## 2019-03-14 NOTE — Telephone Encounter (Signed)
Called to inform pt and [id number]. I will call and update [name] once the patient has been informed.

## 2019-03-15 NOTE — Telephone Encounter (Signed)
Spoke with pt this morning and she would like to start the process for the non invasive trilogy. I made her a phone visit appt for 03/17/19 at 915am. Called and made [name] aware as well.

## 2019-03-17 ENCOUNTER — Encounter: Payer: Self-pay | Admitting: Internal Medicine

## 2019-03-17 ENCOUNTER — Ambulatory Visit (INDEPENDENT_AMBULATORY_CARE_PROVIDER_SITE_OTHER): Payer: Medicare Other | Admitting: Internal Medicine

## 2019-03-17 DIAGNOSIS — J449 Chronic obstructive pulmonary disease, unspecified: Secondary | ICD-10-CM

## 2019-03-17 DIAGNOSIS — J9611 Chronic respiratory failure with hypoxia: Secondary | ICD-10-CM | POA: Diagnosis not present

## 2019-03-17 DIAGNOSIS — J9612 Chronic respiratory failure with hypercapnia: Secondary | ICD-10-CM

## 2019-03-17 NOTE — Telephone Encounter (Signed)
[name] from [employer] calling to see if the forms that were faxed has been received. [name] can be reached at [phone]

## 2019-03-17 NOTE — Telephone Encounter (Signed)
[name] [name] is aware that forms have been received and will be faxed back once complete by Dr. Mortimer Fries.

## 2019-03-17 NOTE — Progress Notes (Signed)
[name]     Date: 03/17/2019,   MRN# [id number] [name] [name] 09/04/1960  Her current pulmonary regimen is as follows: Dulera 200-5, 2 actuations BID Spiriva HH - once a day Prednisone 5  mg daily Albuterol MDI PRN Duoneb PRN BiPAP @ HS Last reported FEV1 in 2017 was 28% predicted Severe COPD    CHIEF COMPLAINT:   Follow-up COPD Follow-up chronic hypoxic respiratory failure Follow-up chronic hypercapnic respiratory failure    HISTORY OF PRESENT ILLNESS  Patient on BiPAP for end-stage COPD   chronic dyspnea and exertion Patient has chronic shortness of breath patient  recent PD Patient has chronic shortness of breath  Patient has chronic dyspnea exertion   No of COPD exacerbation at this time No evidence of heart failure at this time No evidence or signs of infection at this time No respiratory distress No fevers, chills, nausea, vomiting, diarrhea No evidence of lower extremity edema No evidence hemoptysis  Patient with very poor respiratory insufficiency  Patient has had multiple COPD exacerbations in the past Patient currently on chronic prednisone therapy at 5 mg Patient continues to use oxygen 24/7 3 L nasal cannula Patient uses a benefits from oxygen therapy  has severe end stage COPD with FEV1<50%     Patient continues to exhibit signs of hypercapnia associated with chronic respiratory failure secondary to severe end-stage COPD.    Patient is able to clear secretions and protect airway.  Patient requires the use of NIV both nightly and daytime to help with exacerbation periods. The use of NIV will treat patient's high PCO2 levels and can reduce the risk of exacerbations in future hospitalizations when used at night and during the day.  Patient will need these advanced settings in conjunction with his current medication regimen :BiPAP with backup rate has been tried and failed.  Failure to have NIV available  for use over 24  Could lead to severe respiratory failure and cardiaca arrest and death      Current Medication:   Current Outpatient Medications:  .  aspirin 81 MG EC tablet, Take 1 tablet (81 mg total) by mouth daily., Disp: 30 tablet, Rfl: 1 .  cetirizine (ZYRTEC ALLERGY) 10 MG tablet, Take 1 tablet (10 mg total) by mouth daily., Disp: 30 tablet, Rfl: 6 .  DULERA 200-5 MCG/ACT AERO, INHALE 2 PUFFS INTO LUNGS TWICE DAILY, Disp: 39 g, Rfl: 3 .  fluticasone (FLONASE) 50 MCG/ACT nasal spray, Place 1 spray into both nostrils daily., Disp: 1 g, Rfl: 5 .  fluticasone-salmeterol (ADVAIR HFA) 230-21 MCG/ACT inhaler, Inhale 2 puffs into the lungs 2 (two) times daily., Disp: 1 Inhaler, Rfl: 1 .  guaiFENesin (MUCINEX) 600 MG 12 hr tablet, Take 1 tablet (600 mg total) by mouth 2 (two) times daily as needed for cough or to loosen phlegm., Disp: 20 tablet, Rfl: 0 .  ipratropium-albuterol (DUONEB) 0.5-2.5 (3) MG/3ML SOLN, USE 3 ML VIA NEBULIZER EVERY 4 HOURS AS NEEDED FOR WHEEZING OR SHORTNESS OF BREATH, Disp: 1080 mL, Rfl: 5 .  levocetirizine (XYZAL) 5 MG tablet, Take 5 mg by mouth every evening., Disp: , Rfl:  .  LORazepam (ATIVAN) 0.5 MG tablet, Take 0.5 mg by mouth 2 (two) times daily., Disp: , Rfl:  .  predniSONE (DELTASONE) 20 MG tablet, Take 1 tablet (20 mg total) by mouth daily., Disp: 30 tablet, Rfl: 0 .  predniSONE (DELTASONE) 5 MG tablet, Take 1 tablet (5 mg total) by mouth daily with breakfast., Disp: 30 tablet,  Rfl: 1 .  PROAIR HFA 108 (90 Base) MCG/ACT inhaler, INHALE 2 PUFFS BY MOUTH EVERY 6 HOURS AS NEEDED FOR WHEEZING OR SHORTNESS OF BREATH. NEED APPOINTMENT FOR FUTURE REFILLS, Disp: 25.5 g, Rfl: 6 .  sertraline (ZOLOFT) 50 MG tablet, Take 100 mg by mouth daily. , Disp: , Rfl:  .  tiotropium (SPIRIVA HANDIHALER) 18 MCG inhalation capsule, PLACE 1 CAPSULE INTO INHALER AND INHALE EVERY DAY, Disp: 90 capsule, Rfl: 1     ALLERGIES   Zithromax [azithromycin] and  Penicillins     REVIEW OF SYSTEMS   Review of Systems  Constitutional: Negative for chills, fever, malaise/fatigue and weight loss.       Resp distress, increased WOB  HENT: Negative for congestion.   Respiratory: Positive for shortness of breath. Negative for cough, hemoptysis, sputum production and wheezing.   Cardiovascular: Negative for chest pain, palpitations, orthopnea and leg swelling.  Gastrointestinal: Negative for heartburn, nausea and vomiting.  Psychiatric/Behavioral: Negative for depression. The patient is not nervous/anxious.   All other systems reviewed and are negative.       ASSESSMENT/PLAN   Severe end-stage COPD Gold stage D with chronic hypoxic respiratory failure and chronic hypercapnic respiratory failure with significant deconditioned state, patient is a candidate for noninvasive ventilation therapy with trilogy.   Patient will need NIV at night and definitely this will help prevent her from relapsing and from recurrent admissions in conjunction with improving her quality of life    End-stage COPD Continue oxygen as prescribed Strongly recommend NIV therapy  COPD severe Gold stage D Continue inhalers as prescribed At some point in time patient may need scheduled nebulized therapy Patient is very poor respiratory insufficiency Continue Dulera and Spiriva as prescribed Patient on chronic prednisone therapy 5 mg daily  Chronic hypoxic respiratory failure from COPD Continue oxygen as prescribed Patient is a benefits from oxygen therapy and needs this for survival    COVID-19 EDUCATION: The signs and symptoms of COVID-19 were discussed with the patient and how to seek care for testing.  The importance of social distancing was discussed today. Hand Washing Techniques and avoid touching face was advised.     MEDICATION ADJUSTMENTS/LABS AND TESTS ORDERED: Continue inhalers as prescribed Continue oxygen as prescribed Strongly recommendation of  NIV therapy for chronic hypoxic and hypercapnic respiratory failure    CURRENT MEDICATIONS REVIEWED AT LENGTH WITH PATIENT TODAY   Patient satisfied with Plan of action and management. All questions answered  Follow up in 6 months  Total time spent 24 minutes   Maretta Bees Patricia Pesa, M.D.  [facility]  Medical Director [place] Director [facility] Cardio-Pulmonary Department

## 2019-03-17 NOTE — Telephone Encounter (Signed)
Forms have been completed and faxed to [place] along with OV note.  Nothing further is needed.

## 2019-03-17 NOTE — Patient Instructions (Signed)
Continue inhalers as prescribed Continue oxygen as prescribed Strongly recommendation of NIV therapy for chronic hypoxic and hypercapnic respiratory failure

## 2019-07-13 ENCOUNTER — Other Ambulatory Visit: Payer: Self-pay

## 2019-07-13 MED ORDER — SPIRIVA HANDIHALER 18 MCG IN CAPS: ORAL_CAPSULE | RESPIRATORY_TRACT | 1 refills | Status: DC

## 2019-07-13 NOTE — Telephone Encounter (Signed)
Received Rx request from [facility] for spiriva 18MCG.  Rx has been sent to preferred pharmacy, as pt was instructed to continue this medication at that [place]. Nothing further is needed.

## 2019-07-15 ENCOUNTER — Other Ambulatory Visit: Payer: Self-pay

## 2019-07-15 MED ORDER — ADVAIR HFA 230-21 MCG/ACT IN AERO: 2.0000 | INHALATION_SPRAY | Freq: Two times a day (BID) | RESPIRATORY_TRACT | 1 refills | Status: DC

## 2019-07-15 NOTE — Telephone Encounter (Signed)
Rx for Advair 230 has been sent to [facility] as requested.

## 2019-11-11 ENCOUNTER — Other Ambulatory Visit (HOSPITAL_COMMUNITY): Payer: Self-pay | Admitting: Family Medicine

## 2019-11-11 ENCOUNTER — Other Ambulatory Visit (HOSPITAL_COMMUNITY): Payer: Self-pay | Admitting: General Practice

## 2019-11-11 DIAGNOSIS — R634 Abnormal weight loss: Secondary | ICD-10-CM

## 2019-11-23 ENCOUNTER — Other Ambulatory Visit: Payer: Self-pay

## 2019-11-23 ENCOUNTER — Ambulatory Visit
Admission: RE | Admit: 2019-11-23 | Discharge: 2019-11-23 | Disposition: A | Discharge status: Home / Self Care | Payer: Medicare Other | Source: Ambulatory Visit | Attending: Family Medicine | Admitting: Family Medicine

## 2019-11-23 DIAGNOSIS — R634 Abnormal weight loss: Secondary | ICD-10-CM

## 2019-11-23 DIAGNOSIS — J439 Emphysema, unspecified: Secondary | ICD-10-CM | POA: Insufficient documentation

## 2019-11-23 DIAGNOSIS — R911 Solitary pulmonary nodule: Secondary | ICD-10-CM | POA: Diagnosis not present

## 2019-11-23 LAB — POCT I-STAT CREATININE: Creatinine, Ser: 0.7 mg/dL (ref 0.44–1.00)

## 2019-11-23 MED ORDER — IOHEXOL 300 MG/ML  SOLN
60.0000 mL | Freq: Once | INTRAMUSCULAR | Status: AC | PRN
  Administered 2019-11-23: 60 mL via INTRAVENOUS

## 2019-11-30 ENCOUNTER — Inpatient Hospital Stay: Payer: Medicare Other

## 2019-11-30 ENCOUNTER — Inpatient Hospital Stay: Payer: Medicare Other | Attending: Internal Medicine | Admitting: Internal Medicine

## 2020-02-06 ENCOUNTER — Telehealth: Payer: Self-pay | Admitting: Internal Medicine

## 2020-02-06 NOTE — Telephone Encounter (Signed)
PA request form for Spiriva handihaler has been completed and placed in Dr. Clovis Fredrickson folder for signature.

## 2020-02-09 NOTE — Telephone Encounter (Signed)
PA has been signed and faxed to [facility].  Will await determination.

## 2020-02-16 NOTE — Telephone Encounter (Signed)
ATC [employer] for update.  Was placed on hold for >5min.  Will call back.

## 2020-02-17 NOTE — Telephone Encounter (Signed)
PA for Spiriva has been approved.  [facility] is aware of approval. Nothing further is needed at this time.

## 2020-05-22 ENCOUNTER — Emergency Department
Admission: EM | Admit: 2020-05-22 | Discharge: 2020-05-23 | Disposition: A | Discharge status: Home / Self Care | Payer: Medicare Other | Attending: Student in an Organized Health Care Education/Training Program | Admitting: Student in an Organized Health Care Education/Training Program

## 2020-05-22 ENCOUNTER — Emergency Department: Payer: Medicare Other

## 2020-05-22 DIAGNOSIS — Z046 Encounter for general psychiatric examination, requested by authority: Secondary | ICD-10-CM | POA: Insufficient documentation

## 2020-05-22 DIAGNOSIS — Z87891 Personal history of nicotine dependence: Secondary | ICD-10-CM | POA: Diagnosis not present

## 2020-05-22 DIAGNOSIS — Z20822 Contact with and (suspected) exposure to covid-19: Secondary | ICD-10-CM | POA: Diagnosis not present

## 2020-05-22 DIAGNOSIS — F419 Anxiety disorder, unspecified: Secondary | ICD-10-CM | POA: Diagnosis present

## 2020-05-22 DIAGNOSIS — Z7951 Long term (current) use of inhaled steroids: Secondary | ICD-10-CM | POA: Diagnosis not present

## 2020-05-22 DIAGNOSIS — J441 Chronic obstructive pulmonary disease with (acute) exacerbation: Secondary | ICD-10-CM | POA: Diagnosis not present

## 2020-05-22 DIAGNOSIS — F4310 Post-traumatic stress disorder, unspecified: Secondary | ICD-10-CM | POA: Diagnosis not present

## 2020-05-22 DIAGNOSIS — Z853 Personal history of malignant neoplasm of breast: Secondary | ICD-10-CM

## 2020-05-22 DIAGNOSIS — R451 Restlessness and agitation: Secondary | ICD-10-CM

## 2020-05-22 DIAGNOSIS — J9620 Acute and chronic respiratory failure, unspecified whether with hypoxia or hypercapnia: Secondary | ICD-10-CM | POA: Diagnosis present

## 2020-05-22 DIAGNOSIS — J439 Emphysema, unspecified: Secondary | ICD-10-CM | POA: Diagnosis present

## 2020-05-22 DIAGNOSIS — F341 Dysthymic disorder: Secondary | ICD-10-CM | POA: Diagnosis present

## 2020-05-22 DIAGNOSIS — R0682 Tachypnea, not elsewhere classified: Secondary | ICD-10-CM | POA: Insufficient documentation

## 2020-05-22 DIAGNOSIS — Z7982 Long term (current) use of aspirin: Secondary | ICD-10-CM | POA: Insufficient documentation

## 2020-05-22 DIAGNOSIS — R45851 Suicidal ideations: Secondary | ICD-10-CM | POA: Insufficient documentation

## 2020-05-22 DIAGNOSIS — F17200 Nicotine dependence, unspecified, uncomplicated: Secondary | ICD-10-CM | POA: Diagnosis present

## 2020-05-22 DIAGNOSIS — J209 Acute bronchitis, unspecified: Secondary | ICD-10-CM | POA: Diagnosis present

## 2020-05-22 DIAGNOSIS — F411 Generalized anxiety disorder: Secondary | ICD-10-CM

## 2020-05-22 LAB — COMPREHENSIVE METABOLIC PANEL
ALT: 46 U/L — ABNORMAL HIGH (ref 0–44)
AST: 44 U/L — ABNORMAL HIGH (ref 15–41)
Albumin: 3.9 g/dL (ref 3.5–5.0)
Alkaline Phosphatase: 44 U/L (ref 38–126)
Anion gap: 8 (ref 5–15)
BUN: 21 mg/dL (ref 8–23)
CO2: 32 mmol/L (ref 22–32)
Calcium: 9.1 mg/dL (ref 8.9–10.3)
Chloride: 97 mmol/L — ABNORMAL LOW (ref 98–111)
Creatinine, Ser: 0.54 mg/dL (ref 0.44–1.00)
GFR, Estimated: >60 mL/min (ref >60)
Glucose, Bld: 114 mg/dL — ABNORMAL HIGH (ref 70–99)
Potassium: 4.1 mmol/L (ref 3.5–5.1)
Sodium: 137 mmol/L (ref 135–145)
Total Bilirubin: 0.7 mg/dL (ref 0.3–1.2)
Total Protein: 7.7 g/dL (ref 6.5–8.1)

## 2020-05-22 LAB — CBC
HCT: 33.5 % — ABNORMAL LOW (ref 36.0–46.0)
Hemoglobin: 10.3 g/dL — ABNORMAL LOW (ref 12.0–15.0)
MCH: 28.1 pg (ref 26.0–34.0)
MCHC: 30.7 g/dL (ref 30.0–36.0)
MCV: 91.5 fL (ref 80.0–100.0)
Platelets: 203 10*3/uL (ref 150–400)
RBC: 3.66 MIL/uL — ABNORMAL LOW (ref 3.87–5.11)
RDW: 11.9 % (ref 11.5–15.5)
WBC: 7 10*3/uL (ref 4.0–10.5)
nRBC: 0 % (ref 0.0–0.2)

## 2020-05-22 LAB — BLOOD GAS, VENOUS
Acid-Base Excess: 10.6 mmol/L — ABNORMAL HIGH (ref 0.0–2.0)
Bicarbonate: 37.5 mmol/L — ABNORMAL HIGH (ref 20.0–28.0)
O2 Saturation: 93.6 %
Patient temperature: 37
pCO2, Ven: 62 mmHg — ABNORMAL HIGH (ref 44.0–60.0)
pH, Ven: 7.39 (ref 7.250–7.430)
pO2, Ven: 70 mmHg — ABNORMAL HIGH (ref 32.0–45.0)

## 2020-05-22 LAB — RESP PANEL BY RT-PCR (FLU A&B, COVID) ARPGX2
Influenza A by PCR: NEGATIVE
Influenza B by PCR: NEGATIVE
SARS Coronavirus 2 by RT PCR: NEGATIVE

## 2020-05-22 LAB — ETHANOL: Alcohol, Ethyl (B): <10 mg/dL (ref <10)

## 2020-05-22 MED ORDER — IPRATROPIUM-ALBUTEROL 0.5-2.5 (3) MG/3ML IN SOLN
3.0000 mL | Freq: Once | RESPIRATORY_TRACT | Status: AC
  Administered 2020-05-22: 3 mL via RESPIRATORY_TRACT
  Filled 2020-05-22: qty 3

## 2020-05-22 MED ORDER — LORAZEPAM 0.5 MG PO TABS
0.5000 mg | ORAL_TABLET | Freq: Once | ORAL | Status: AC
  Administered 2020-05-22: 0.5 mg via ORAL
  Filled 2020-05-22: qty 1

## 2020-05-22 MED ORDER — MOMETASONE FURO-FORMOTEROL FUM 200-5 MCG/ACT IN AERO
2.0000 | INHALATION_SPRAY | Freq: Every day | RESPIRATORY_TRACT | Status: DC
  Filled 2020-05-22: qty 8.8

## 2020-05-22 MED ORDER — IPRATROPIUM-ALBUTEROL 0.5-2.5 (3) MG/3ML IN SOLN: 3.0000 mL | RESPIRATORY_TRACT | Status: DC | PRN

## 2020-05-22 NOTE — ED Triage Notes (Signed)
Pt brought in by [facility] from home, family states she is refusing to take her home meds. IVC taken out by family/bpd

## 2020-05-22 NOTE — ED Provider Notes (Signed)
[facility] Emergency Department Provider Note    Event Date/Time   First MD Initiated Contact with Patient 05/22/20 2044     (approximate)  I have reviewed the triage vital signs and the nursing notes.   HISTORY  Chief Complaint Psychiatric Evaluation    HPI [name] [name] is a 60 y.o. female the below listed past medical history presents to the ER under IVC by [place] having increasing depression schizophrenia noncompliant with medications and reportedly suicidal ideations at home.  Patient states that the IVC paperwork is entered room patient admits to feeling very anxious.  Denies any complaints but does appear mildly tachypneic.  States that she does not wear home oxygen but on review of records it does appear that she does wear 2 L at home.  Was just recently admitted to hospital for COPD exacerbation altered mental status and hypoxia the beginning of the month.    Past Medical History:  Diagnosis Date  . Breast cancer ([place]) 2011   BILAT  lumpectomy 2011  . Cancer ([place])   . COPD (chronic obstructive pulmonary disease) ([place])   . Personal history of radiation therapy 2011   fu bil. breast cancer   Family History  Problem Relation Age of Onset  . Breast cancer Mother   . Lung cancer Mother    Past Surgical History:  Procedure Laterality Date  . ABDOMINAL HYSTERECTOMY    . BREAST BIOPSY Left 2011   positive  . BREAST BIOPSY Right 2011   positive  . BREAST LUMPECTOMY Bilateral 2011   Patient Active Problem List   Diagnosis Date Noted  . Acute on chronic respiratory failure with hypoxia ([place]) 01/04/2016  . Leukocytosis 01/04/2016  . COPD (chronic obstructive pulmonary disease) ([place]) 01/04/2016  . History of breast cancer 11/13/2015  . Shingles 11/13/2015  . Persistent depressive disorder 11/11/2015  . PTSD (post-traumatic stress disorder) 11/11/2015  . Respiratory failure with hypercapnia ([place])  10/31/2015  . Anxiety 05/06/2015  . Smoker 05/06/2015  . Emphysema lung ([place]) 05/06/2015  . Acute on chronic respiratory failure ([place]) 07/15/2014  . COPD exacerbation ([place]) 07/15/2014  . Acute bronchitis 07/15/2014  . Tachycardia 07/15/2014      Prior to Admission medications   Medication Sig Start Date End Date Taking? Authorizing Provider  aspirin 81 MG EC tablet Take 1 tablet (81 mg total) by mouth daily. 05/14/15   Demetrios Loll, MD  cetirizine (ZYRTEC ALLERGY) 10 MG tablet Take 1 tablet (10 mg total) by mouth daily. 04/12/18   Flora Lipps, MD  DULERA 200-5 MCG/ACT AERO INHALE 2 PUFFS INTO LUNGS TWICE DAILY 01/28/18   Flora Lipps, MD  fluticasone (FLONASE) 50 MCG/ACT nasal spray Place 1 spray into both nostrils daily. 01/06/17 01/06/18  Flora Lipps, MD  fluticasone-salmeterol (ADVAIR HFA) 160-73 MCG/ACT inhaler Inhale 2 puffs into the lungs 2 (two) times daily. 07/15/19   Flora Lipps, MD  guaiFENesin (MUCINEX) 600 MG 12 hr tablet Take 1 tablet (600 mg total) by mouth 2 (two) times daily as needed for cough or to loosen phlegm. 01/06/17   Flora Lipps, MD  ipratropium-albuterol (DUONEB) 0.5-2.5 (3) MG/3ML SOLN USE 3 ML VIA NEBULIZER EVERY 4 HOURS AS NEEDED FOR WHEEZING OR SHORTNESS OF BREATH 01/06/17   Flora Lipps, MD  levocetirizine (XYZAL) 5 MG tablet Take 5 mg by mouth every evening.    [provider]  LORazepam (ATIVAN) 0.5 MG tablet Take 0.5 mg by mouth 2 (two) times daily.  [provider]  predniSONE (DELTASONE) 5 MG tablet Take 1 tablet (5 mg total) by mouth daily with breakfast. 02/25/19   Flora Lipps, MD  PROAIR HFA 108 (90 Base) MCG/ACT inhaler INHALE 2 PUFFS BY MOUTH EVERY 6 HOURS AS NEEDED FOR WHEEZING OR SHORTNESS OF BREATH. NEED APPOINTMENT FOR FUTURE REFILLS 01/28/18   Flora Lipps, MD  sertraline (ZOLOFT) 50 MG tablet Take 100 mg by mouth daily.  10/27/17   [provider]  tiotropium (SPIRIVA HANDIHALER) 18 MCG inhalation capsule PLACE 1 CAPSULE  INTO INHALER AND INHALE EVERY DAY 07/13/19   Flora Lipps, MD    Allergies Zithromax [azithromycin] and Penicillins    Social History Social History   Tobacco Use  . Smoking status: Former Smoker    Packs/day: 0.50    Years: 40.00    Pack years: 20.00  . Smokeless tobacco: Never Used  . Tobacco comment: quit smoking 09/26/15  Substance Use Topics  . Alcohol use: No  . Drug use: No    Review of Systems Patient denies headaches, rhinorrhea, blurry vision, numbness, shortness of breath, chest pain, edema, cough, abdominal pain, nausea, vomiting, diarrhea, dysuria, fevers, rashes or hallucinations unless otherwise stated above in HPI. ____________________________________________   PHYSICAL EXAM:  VITAL SIGNS: Vitals:   05/22/20 2056  BP: 119/83  Pulse: 100  Resp: 18  Temp: 98.7 F (37.1 C)  SpO2: 97%    Constitutional: Alert and oriented.  Eyes: Conjunctivae are normal.  Head: Atraumatic. Nose: No congestion/rhinnorhea. Mouth/Throat: Mucous membranes are moist.   Neck: No stridor. Painless ROM.  Cardiovascular: Normal rate, regular rhythm. Grossly normal heart sounds.  Good peripheral circulation. Respiratory: Normal respiratory effort.  No retractions. Lungs with faint expiratory wheeze Gastrointestinal: Soft and nontender. No distention. No abdominal bruits. No CVA tenderness. Genitourinary:  Musculoskeletal: No lower extremity tenderness nor edema.  No joint effusions. Neurologic:  Normal speech and language. No gross focal neurologic deficits are appreciated. No facial droop Skin:  Skin is warm, dry and intact. No rash noted. Psychiatric: anxious appearing, withdrawn.  ____________________________________________   LABS (all labs ordered are listed, but only abnormal results are displayed)  Results for orders placed or performed during the hospital encounter of 05/22/20 (from the past 24 hour(s))  CBC     Status: Abnormal   Collection Time: 05/22/20  8:56  PM  Result Value Ref Range   WBC 7.0 4.0 - 10.5 K/uL   RBC 3.66 (L) 3.87 - 5.11 MIL/uL   Hemoglobin 10.3 (L) 12.0 - 15.0 g/dL   HCT 33.5 (L) 36.0 - 46.0 %   MCV 91.5 80.0 - 100.0 fL   MCH 28.1 26.0 - 34.0 pg   MCHC 30.7 30.0 - 36.0 g/dL   RDW 11.9 11.5 - 15.5 %   Platelets 203 150 - 400 K/uL   nRBC 0.0 0.0 - 0.2 %  Comprehensive metabolic panel     Status: Abnormal   Collection Time: 05/22/20  8:56 PM  Result Value Ref Range   Sodium 137 135 - 145 mmol/L   Potassium 4.1 3.5 - 5.1 mmol/L   Chloride 97 (L) 98 - 111 mmol/L   CO2 32 22 - 32 mmol/L   Glucose, Bld 114 (H) 70 - 99 mg/dL   BUN 21 8 - 23 mg/dL   Creatinine, Ser 0.54 0.44 - 1.00 mg/dL   Calcium 9.1 8.9 - 10.3 mg/dL   Total Protein 7.7 6.5 - 8.1 g/dL   Albumin 3.9 3.5 - 5.0 g/dL  AST 44 (H) 15 - 41 U/L   ALT 46 (H) 0 - 44 U/L   Alkaline Phosphatase 44 38 - 126 U/L   Total Bilirubin 0.7 0.3 - 1.2 mg/dL   GFR, Estimated >60 >60 mL/min   Anion gap 8 5 - 15  Ethanol     Status: None   Collection Time: 05/22/20  8:56 PM  Result Value Ref Range   Alcohol, Ethyl (B) <10 <10 mg/dL  Blood gas, venous     Status: Abnormal   Collection Time: 05/22/20  9:05 PM  Result Value Ref Range   pH, Ven 7.39 7.250 - 7.430   pCO2, Ven 62 (H) 44.0 - 60.0 mmHg   pO2, Ven 70.0 (H) 32.0 - 45.0 mmHg   Bicarbonate 37.5 (H) 20.0 - 28.0 mmol/L   Acid-Base Excess 10.6 (H) 0.0 - 2.0 mmol/L   O2 Saturation 93.6 %   Patient temperature 37.0    Collection site VEIN    Sample type VENOUS   Resp Panel by RT-PCR (Flu A&B, Covid) Nasopharyngeal Swab     Status: None   Collection Time: 05/22/20  9:18 PM   Specimen: Nasopharyngeal Swab; Nasopharyngeal(NP) swabs in vial transport medium  Result Value Ref Range   SARS Coronavirus 2 by RT PCR NEGATIVE NEGATIVE   Influenza A by PCR NEGATIVE NEGATIVE   Influenza B by PCR NEGATIVE NEGATIVE   ____________________________________________  EKG My review and personal interpretation at Time: 23:30    Indication: psych  Rate: 90  Rhythm: sinus Axis: normal Other: no stemi criteria, nonspecific st abn ____________________________________________  RADIOLOGY  I personally reviewed all radiographic images ordered to evaluate for the above acute complaints and reviewed radiology reports and findings.  These findings were personally discussed with the patient.  Please see medical record for radiology report.  ____________________________________________   PROCEDURES  Procedure(s) performed:  Procedures    Critical Care performed: no ____________________________________________   INITIAL IMPRESSION / ASSESSMENT AND PLAN / ED COURSE  Pertinent labs & imaging results that were available during my care of the patient were reviewed by me and considered in my medical decision making (see chart for details).   DDX: Psychosis, delirium, medication effect, noncompliance, polysubstance abuse, Si, Hi, depression, copd, chf   [name] [name] is a 60 y.o. who presents to the ED with IVC for the above symptoms and presentation.  Patient does appear very anxious and paranoid not providing much additional history.  Recent admitted for COPD exacerbation & plan steroids a couple weeks ago which may be exacerbating her presentation today.  But will be sent for by differential we will monitor patient we will order chest x-ray.  She is nonhypoxic and hemodynamically stable.  Suspect this is primarily underlying psychiatric illness.  Clinical Course as of 05/22/20 2352  Tue May 22, 2020  2337 VBG appears with chronic mild hypercapnia with compensation.  She is denying any shortness of breath at this time.  Will give additional nebs.  We will hold off on steroids at this time as she d is presenting for agitation and concern for psychosis.  Patient appears medically cleared for psychiatric evaluation. [PR]    Clinical Course User Index [PR] Merlyn Lot, MD   The patient has been placed in  psychiatric observation due to the need to provide a safe environment for the patient while obtaining psychiatric consultation and evaluation, as well as ongoing medical and medication management to treat the patient's condition.  The patient has been placed  under full IVC at this time.  The patient was evaluated in Emergency Department today for the symptoms described in the history of present illness. He/she was evaluated in the context of the global COVID-19 pandemic, which necessitated consideration that the patient might be at risk for infection with the SARS-CoV-2 virus that causes COVID-19. Institutional protocols and algorithms that pertain to the evaluation of patients at risk for COVID-19 are in a state of rapid change based on information released by regulatory bodies including the CDC and federal and state organizations. These policies and algorithms were followed during the patient's care in the ED.  As part of my medical decision making, I reviewed the following data within the [facility] notes reviewed and incorporated, Labs reviewed, notes from prior ED visits and [place] Controlled Substance Database   ____________________________________________   FINAL CLINICAL IMPRESSION(S) / ED DIAGNOSES  Final diagnoses:  Suicidal ideation  Agitation      NEW MEDICATIONS STARTED DURING THIS VISIT:  New Prescriptions   No medications on file     Note:  This document was prepared using Dragon voice recognition software and may include unintentional dictation errors.    Merlyn Lot, MD 05/22/20 2352

## 2020-05-23 DIAGNOSIS — F4310 Post-traumatic stress disorder, unspecified: Secondary | ICD-10-CM

## 2020-05-23 DIAGNOSIS — F411 Generalized anxiety disorder: Secondary | ICD-10-CM

## 2020-05-23 MED ORDER — SERTRALINE HCL 100 MG PO TABS: 100.0000 mg | ORAL_TABLET | Freq: Every day | ORAL | 1 refills | Status: AC

## 2020-05-23 MED ORDER — RISPERIDONE 1 MG PO TABS: 1.0000 mg | ORAL_TABLET | Freq: Two times a day (BID) | ORAL | 1 refills | Status: AC

## 2020-05-23 NOTE — ED Provider Notes (Signed)
Emergency Medicine Observation Re-evaluation Note  [name] [name] is a 60 y.o. female, seen on rounds today.  Pt initially presented to the ED for complaints of Psychiatric Evaluation Currently, the patient is resting, voices no medical complaints.  Physical Exam  BP 119/83 (BP Location: Left Arm)   Pulse 100   Temp 98.7 F (37.1 C) (Oral)   Resp 18   Ht 5\' 2"  (1.575 m)   Wt 54.4 kg   SpO2 97%   BMI 21.95 kg/m  Physical Exam General: Resting in no acute distress Cardiac: No cyanosis Lungs: Equal rise and fall Psych: Not agitated  ED Course / MDM  EKG:   I have reviewed the labs performed to date as well as medications administered while in observation.  Recent changes in the last 24 hours include no events overnight.  Plan  Current plan is for psychiatric reassessment this morning. Patient is under full IVC at this time.   Paulette Blanch, MD 05/23/20 [phone]

## 2020-05-23 NOTE — BH Assessment (Signed)
Comprehensive Clinical Assessment (CCA) Note  05/23/2020 [name] [name] [id number]   Dorna Mai, 60 year old female who presents to [facility] ED involuntarily for treatment. Per triage note, Pt brought in by [facility] from home, family states she is refusing to take her home meds. IVC taken out by family/bpd   During TTS assessment pt presents alert and oriented x 4, restless but cooperative, and mood-congruent with affect. The pt does not appear to be responding to internal or external stimuli. Neither is the pt presenting with any delusional thinking. Pt verified the information provided to triage RN.   Pt identifies her main complaint to be that she is experiencing pain in her lungs. Per IVC paperwork, patient has not been compliant with taking her medications as prescribed. Patient only responded to yes or no questions being asked to her then closed her eyes. Patient denies current SA. Patient reports no recent INPT or OPT hx. Pt denies SI/HI/AH/VH.   Per Kennyth Lose, NP, pt is recommended for overnight observation to be reassessed in the morning.   Chief Complaint:  Chief Complaint  Patient presents with  . Psychiatric Evaluation   Visit Diagnosis: COPD    CCA Screening, Triage and Referral (STR)  Patient Reported Information How did you hear about [place]? Family/Friend  Referral name: No data recorded Referral phone number: No data recorded  Whom do you see for routine medical problems? No data recorded Practice/Facility Name: No data recorded Practice/Facility Phone Number: No data recorded Name of Contact: No data recorded Contact Number: No data recorded Contact Fax Number: No data recorded Prescriber Name: No data recorded Prescriber Address (if known): No data recorded  What Is the Reason for Your Visit/Call Today? Patient was brought to ED because her family states she is not taking her medications.  How Long Has This Been Causing You Problems? 1 wk - 1 month  What Do  You Feel Would Help You the Most Today? Medication(s)   Have You Recently Been in Any Inpatient Treatment (Hospital/Detox/Crisis Center/28-Day Program)? No  Name/Location of Program/Hospital:No data recorded How Long Were You There? No data recorded When Were You Discharged? No data recorded  Have You Ever Received Services From [facility] Before? No data recorded Who Do You See at [facility]? No data recorded  Have You Recently Had Any Thoughts About Hurting Yourself? No  Are You Planning to Commit Suicide/Harm Yourself At This time? No   Have you Recently Had Thoughts About [place]? No  Explanation: No data recorded  Have You Used Any Alcohol or Drugs in the Past 24 Hours? No  How Long Ago Did You Use Drugs or Alcohol? No data recorded What Did You Use and How Much? No data recorded  Do You Currently Have a Therapist/Psychiatrist? No  Name of Therapist/Psychiatrist: No data recorded  Have You Been Recently Discharged From Any Office Practice or Programs? No  Explanation of Discharge From Practice/Program: No data recorded    CCA Screening Triage Referral Assessment Type of Contact: Face-to-Face  Is this Initial or Reassessment? No data recorded Date Telepsych consult ordered in [facility]:  No data recorded Time Telepsych consult ordered in [facility]:  No data recorded  Patient Reported Information Reviewed? Yes  Patient Left Without Being Seen? No data recorded Reason for Not Completing Assessment: No data recorded  Collateral Involvement: None provided   Does Patient Have a [place]? No data recorded Name and Contact of Legal Guardian: No data recorded If Minor  and Not Living with Parent(s), Who has Custody? n/a  Is CPS involved or ever been involved? Never  Is APS involved or ever been involved? Never   Patient Determined To Be At Risk for Harm To Self or Others Based on Review of Patient Reported Information or Presenting  Complaint? No  Method: No data recorded Availability of Means: No data recorded Intent: No data recorded Notification Required: No data recorded Additional Information for Danger to Others Potential: No data recorded Additional Comments for Danger to Others Potential: No data recorded Are There Guns or Other Weapons in Your Home? No data recorded Types of Guns/Weapons: No data recorded Are These Weapons Safely Secured?                            No data recorded Who Could Verify You Are Able To Have These Secured: No data recorded Do You Have any Outstanding Charges, Pending Court Dates, Parole/Probation? No data recorded Contacted To Inform of Risk of Harm To Self or Others: No data recorded  [facility] of Assessment: [facility] ED   Does Patient Present under Involuntary Commitment? Yes  IVC Papers Initial File Date: 05/23/2020   [place] of Residence: [place]   Patient Currently Receiving the Following Services: Medication Management   Determination of Need: Emergent (2 hours)   Options For Referral: Medication Management     CCA Biopsychosocial Intake/Chief Complaint:  No data recorded Current Symptoms/Problems: No data recorded  Patient Reported Schizophrenia/Schizoaffective Diagnosis in Past: No data recorded  Strengths: No data recorded Preferences: No data recorded Abilities: No data recorded  Type of Services Patient Feels are Needed: No data recorded  Initial Clinical Notes/Concerns: No data recorded  Mental Health Symptoms Depression:  No data recorded  Duration of Depressive symptoms: No data recorded  Mania:  No data recorded  Anxiety:   No data recorded  Psychosis:  No data recorded  Duration of Psychotic symptoms: No data recorded  Trauma:  No data recorded  Obsessions:  No data recorded  Compulsions:  No data recorded  Inattention:  No data recorded  Hyperactivity/Impulsivity:  No data recorded  Oppositional/Defiant Behaviors:  No data recorded   Emotional Irregularity:  No data recorded  Other Mood/Personality Symptoms:  No data recorded   Mental Status Exam Appearance and self-care  Stature:  No data recorded  Weight:  No data recorded  Clothing:  No data recorded  Grooming:  No data recorded  Cosmetic use:  No data recorded  Posture/gait:  No data recorded  Motor activity:  No data recorded  Sensorium  Attention:  No data recorded  Concentration:  No data recorded  Orientation:  No data recorded  Recall/memory:  No data recorded  Affect and Mood  Affect:  No data recorded  Mood:  No data recorded  Relating  Eye contact:  No data recorded  Facial expression:  No data recorded  Attitude toward examiner:  No data recorded  Thought and Language  Speech flow: No data recorded  Thought content:  No data recorded  Preoccupation:  No data recorded  Hallucinations:  No data recorded  Organization:  No data recorded  [employer] of Knowledge:  No data recorded  Intelligence:  No data recorded  Abstraction:  No data recorded  Judgement:  No data recorded  Reality Testing:  No data recorded  Insight:  No data recorded  Decision Making:  No data recorded  Social Functioning  Social Maturity:  No data recorded  Social Judgement:  No data recorded  Stress  Stressors:  No data recorded  Coping Ability:  No data recorded  Skill Deficits:  No data recorded  Supports:  No data recorded    Religion:    Leisure/Recreation:    Exercise/Diet:     CCA Employment/Education Employment/Work Situation:    Education:     CCA Family/Childhood History Family and Relationship History:    Childhood History:     Child/Adolescent Assessment:     CCA Substance Use Alcohol/Drug Use:                           ASAM's:  Six Dimensions of Multidimensional Assessment  Dimension 1:  Acute Intoxication and/or Withdrawal Potential:      Dimension 2:  Biomedical Conditions and  Complications:      Dimension 3:  Emotional, Behavioral, or Cognitive Conditions and Complications:     Dimension 4:  Readiness to Change:     Dimension 5:  Relapse, Continued use, or Continued Problem Potential:     Dimension 6:  Recovery/Living Environment:     ASAM Severity Score:    ASAM Recommended Level of Treatment:     Substance use Disorder (SUD)    Recommendations for Services/Supports/Treatments:    DSM5 Diagnoses: Patient Active Problem List   Diagnosis Date Noted  . Acute on chronic respiratory failure with hypoxia ([place]) 01/04/2016  . Leukocytosis 01/04/2016  . COPD (chronic obstructive pulmonary disease) ([place]) 01/04/2016  . History of breast cancer 11/13/2015  . Shingles 11/13/2015  . Persistent depressive disorder 11/11/2015  . PTSD (post-traumatic stress disorder) 11/11/2015  . Respiratory failure with hypercapnia ([place]) 10/31/2015  . Anxiety 05/06/2015  . Smoker 05/06/2015  . Emphysema lung ([place]) 05/06/2015  . Acute on chronic respiratory failure ([place]) 07/15/2014  . COPD exacerbation ([place]) 07/15/2014  . Acute bronchitis 07/15/2014  . Tachycardia 07/15/2014    Patient Centered Plan: Patient is on the following Treatment Plan(s):     Referrals to Alternative Service(s): Referred to Alternative Service(s):   Place:   Date:   Time:    Referred to Alternative Service(s):   Place:   Date:   Time:    Referred to Alternative Service(s):   Place:   Date:   Time:    Referred to Alternative Service(s):   Place:   Date:   Time:     [place] Henri Glennon Mac, Counselor, LCAS-A

## 2020-05-23 NOTE — Consult Note (Signed)
[facility] Consult   Reason for Consult: Psychiatric evaluation Referring Physician: Dr. Quentin Cornwall Patient Identification: [name] [name] MRN:  [id number] Principal Diagnosis: <principal problem not specified> Diagnosis:  Active Problems:   Acute on chronic respiratory failure (HCC)   COPD exacerbation ([place])   Acute bronchitis   Anxiety   Smoker   Emphysema lung ([place])   History of breast cancer   Persistent depressive disorder   PTSD (post-traumatic stress disorder)   Total Time spent with patient: 20 minutes  Subjective: "I feel sick in my lungs." [name] [name] is a 60 y.o. female patient presented to [facility] ED via [facility] from home voluntarily.  Per the ED triage nurse note, Pt brought in by [facility] from home, family states she is refusing to take her home meds. IVC taken out by family/bpd. During the patient assessment, she admits to not taking her medication at home. The patient disclosed she consistently feels ill, which has to do with more physical problems, then it is psychiatric. The patient states she does see a psychiatrist, but she tends to feel more physically ill. The patient has a long chronic history of COPD, breast cancer and other lung issues. She states tonight; she does feel sick. The patient was seen face-to-face by this provider; the chart was reviewed and consulted with Dr. Beather Arbour on 05/23/2020 due to the patient's care. The patient presents to be having more of a medical problem with her COPD. She disclosed she does not take her medications because she does not feel well medically. It was discussed with the EDP that the patient remained under observation overnight and will be reassessed in the a.m. to determine if she meets the criteria for psychiatric inpatient admission; she could be discharged back home.  On evaluation, the patient is alert and oriented x 3, calm, cooperative, and mood-congruent with affect. The patient only responds when  questions are asked to her. The patient will answer and then close her eyes and take a breath The patient does not appear to be responding to internal or external stimuli. Neither is the patient presenting with any delusional thinking. The patient denies auditory or visual hallucinations. The patient denies any suicidal, homicidal, or self-harm ideations. The patient is not presenting with any psychotic or paranoid behaviors. During an encounter with the patient, she was able to answer questions appropriately.  HPI: Per Dr. Quentin Cornwall, [name] [name] is a 60 y.o. female the below listed past medical history presents to the ER under IVC by [facility] portably having increasing depression schizophrenia noncompliant with medications and reportedly suicidal ideations at home.  Patient states that the IVC paperwork is entered room patient admits to feeling very anxious.  Denies any complaints but does appear mildly tachypneic.  States that she does not wear home oxygen but on review of records it does appear that she does wear 2 L at home.  Was just recently admitted to hospital for COPD exacerbation altered mental status and hypoxia the beginning of the month.  Past Psychiatric History: No pertinent past psychiatric history   Risk to Self:   Risk to Others:   Prior Inpatient Therapy:   Prior Outpatient Therapy:    Past Medical History:  Past Medical History:  Diagnosis Date  . Breast cancer ([place]) 2011   BILAT  lumpectomy 2011  . Cancer ([place])   . COPD (chronic obstructive pulmonary disease) ([place])   . Personal history of radiation therapy 2011   fu bil. breast cancer  Past Surgical History:  Procedure Laterality Date  . ABDOMINAL HYSTERECTOMY    . BREAST BIOPSY Left 2011   positive  . BREAST BIOPSY Right 2011   positive  . BREAST LUMPECTOMY Bilateral 2011   Family History:  Family History  Problem Relation Age of Onset  . Breast cancer Mother   . Lung cancer  Mother    Family Psychiatric  History: Patient Social History:  Social History   Substance and Sexual Activity  Alcohol Use No     Social History   Substance and Sexual Activity  Drug Use No    Social History   Socioeconomic History  . Marital status: Single    Spouse name: Not on file  . Number of children: Not on file  . Years of education: Not on file  . Highest education level: Not on file  Occupational History  . Not on file  Tobacco Use  . Smoking status: Former Smoker    Packs/day: 0.50    Years: 40.00    Pack years: 20.00  . Smokeless tobacco: Never Used  . Tobacco comment: quit smoking 09/26/15  Substance and Sexual Activity  . Alcohol use: No  . Drug use: No  . Sexual activity: Not on file  Other Topics Concern  . Not on file  Social History Narrative  . Not on file   Social Determinants of Health   Financial Resource Strain: Not on file  Food Insecurity: Not on file  Transportation Needs: Not on file  Physical Activity: Not on file  Stress: Not on file  Social Connections: Not on file   Additional Social History:    Allergies:   Allergies  Allergen Reactions  . Zithromax [Azithromycin] Other (See Comments)    Patient reports that medication makes breathing worse  . Penicillins Swelling, Rash and Other (See Comments)    Pt states that she had tongue swelling.   Has patient had a PCN reaction causing immediate rash, facial/tongue/throat swelling, SOB or lightheadedness with hypotension: Yes Has patient had a PCN reaction causing severe rash involving mucus membranes or skin necrosis: No Has patient had a PCN reaction that required hospitalization No Has patient had a PCN reaction occurring within the last 10 years: No If all of the above answers are "NO", then may proceed with Cephalosporin use.    Labs:  Results for orders placed or performed during the hospital encounter of 05/22/20 (from the past 48 hour(s))  CBC     Status: Abnormal    Collection Time: 05/22/20  8:56 PM  Result Value Ref Range   WBC 7.0 4.0 - 10.5 K/uL   RBC 3.66 (L) 3.87 - 5.11 MIL/uL   Hemoglobin 10.3 (L) 12.0 - 15.0 g/dL   HCT 33.5 (L) 36.0 - 46.0 %   MCV 91.5 80.0 - 100.0 fL   MCH 28.1 26.0 - 34.0 pg   MCHC 30.7 30.0 - 36.0 g/dL   RDW 11.9 11.5 - 15.5 %   Platelets 203 150 - 400 K/uL   nRBC 0.0 0.0 - 0.2 %    Comment: Performed at [facility], [address] [place], [place] [address]  Comprehensive metabolic panel     Status: Abnormal   Collection Time: 05/22/20  8:56 PM  Result Value Ref Range   Sodium 137 135 - 145 mmol/L   Potassium 4.1 3.5 - 5.1 mmol/L   Chloride 97 (L) 98 - 111 mmol/L   CO2 32 22 - 32 mmol/L  Glucose, Bld 114 (H) 70 - 99 mg/dL    Comment: Glucose reference range applies only to samples taken after fasting for at least 8 hours.   BUN 21 8 - 23 mg/dL   Creatinine, Ser 0.54 0.44 - 1.00 mg/dL   Calcium 9.1 8.9 - 10.3 mg/dL   Total Protein 7.7 6.5 - 8.1 g/dL   Albumin 3.9 3.5 - 5.0 g/dL   AST 44 (H) 15 - 41 U/L   ALT 46 (H) 0 - 44 U/L   Alkaline Phosphatase 44 38 - 126 U/L   Total Bilirubin 0.7 0.3 - 1.2 mg/dL   GFR, Estimated >60 >60 mL/min    Comment: (NOTE) Calculated using the CKD-EPI Creatinine Equation (2021)    Anion gap 8 5 - 15    Comment: Performed at [facility], [address] [place], [place] [address]  Ethanol     Status: None   Collection Time: 05/22/20  8:56 PM  Result Value Ref Range   Alcohol, Ethyl (B) <10 <10 mg/dL    Comment: (NOTE) Lowest detectable limit for serum alcohol is 10 mg/dL.  For medical purposes only. Performed at [facility], [address] [place], [place] [address]   Blood gas, venous     Status: Abnormal   Collection Time: 05/22/20  9:05 PM  Result Value Ref Range   pH, Ven 7.39 7.250 - 7.430   pCO2, Ven 62 (H) 44.0 - 60.0 mmHg   pO2, Ven 70.0 (H) 32.0 - 45.0 mmHg   Bicarbonate 37.5 (H) 20.0 - 28.0 mmol/L   Acid-Base Excess 10.6  (H) 0.0 - 2.0 mmol/L   O2 Saturation 93.6 %   Patient temperature 37.0    Collection site VEIN    Sample type VENOUS     Comment: Performed at [facility], [address] [place], [place] [address]  Resp Panel by RT-PCR (Flu A&B, Covid) Nasopharyngeal Swab     Status: None   Collection Time: 05/22/20  9:18 PM   Specimen: Nasopharyngeal Swab; Nasopharyngeal(NP) swabs in vial transport medium  Result Value Ref Range   SARS Coronavirus 2 by RT PCR NEGATIVE NEGATIVE    Comment: (NOTE) SARS-CoV-2 target nucleic acids are NOT DETECTED.  The SARS-CoV-2 RNA is generally detectable in upper respiratory specimens during the acute phase of infection. The lowest concentration of SARS-CoV-2 viral copies this assay can detect is 138 copies/mL. A negative result does not preclude SARS-Cov-2 infection and should not be used as the sole basis for treatment or other patient management decisions. A negative result may occur with  improper specimen collection/handling, submission of specimen other than nasopharyngeal swab, presence of viral mutation(s) within the areas targeted by this assay, and inadequate number of viral copies(<138 copies/mL). A negative result must be combined with clinical observations, patient history, and epidemiological information. The expected result is Negative.  Fact Sheet for Patients:  EntrepreneurPulse.com.au  Fact Sheet for Healthcare Providers:  IncredibleEmployment.be  This test is no t yet approved or cleared by the [place] FDA and  has been authorized for detection and/or diagnosis of SARS-CoV-2 by FDA under an Emergency Use Authorization (EUA). This EUA will remain  in effect (meaning this test can be used) for the duration of the COVID-19 declaration under Section 564(b)(1) of the Act, 21 U.S.C.section 360bbb-3(b)(1), unless the authorization is terminated  or revoked sooner.       Influenza A by PCR  NEGATIVE NEGATIVE   Influenza B by PCR NEGATIVE NEGATIVE  Comment: (NOTE) The Xpert Xpress SARS-CoV-2/FLU/RSV plus assay is intended as an aid in the diagnosis of influenza from Nasopharyngeal swab specimens and should not be used as a sole basis for treatment. Nasal washings and aspirates are unacceptable for Xpert Xpress SARS-CoV-2/FLU/RSV testing.  Fact Sheet for Patients: EntrepreneurPulse.com.au  Fact Sheet for Healthcare Providers: IncredibleEmployment.be  This test is not yet approved or cleared by the [place] FDA and has been authorized for detection and/or diagnosis of SARS-CoV-2 by FDA under an Emergency Use Authorization (EUA). This EUA will remain in effect (meaning this test can be used) for the duration of the COVID-19 declaration under Section 564(b)(1) of the Act, 21 U.S.C. section 360bbb-3(b)(1), unless the authorization is terminated or revoked.  Performed at [facility], [address], [place], [place] [address]     Current Facility-Administered Medications  Medication Dose Route Frequency Provider Last Rate Last Admin  . ipratropium-albuterol (DUONEB) 0.5-2.5 (3) MG/3ML nebulizer solution 3 mL  3 mL Nebulization Q4H PRN Merlyn Lot, MD      . mometasone-formoterol [facility]) 200-5 MCG/ACT inhaler 2 puff  2 puff Inhalation Q0600 Merlyn Lot, MD       Current Outpatient Medications  Medication Sig Dispense Refill  . aspirin 81 MG EC tablet Take 1 tablet (81 mg total) by mouth daily. 30 tablet 1  . cetirizine (ZYRTEC ALLERGY) 10 MG tablet Take 1 tablet (10 mg total) by mouth daily. 30 tablet 6  . DULERA 200-5 MCG/ACT AERO INHALE 2 PUFFS INTO LUNGS TWICE DAILY 39 g 3  . fluticasone (FLONASE) 50 MCG/ACT nasal spray Place 1 spray into both nostrils daily. 1 g 5  . fluticasone-salmeterol (ADVAIR HFA) 230-21 MCG/ACT inhaler Inhale 2 puffs into the lungs 2 (two) times daily. 1 Inhaler 1  . guaiFENesin  (MUCINEX) 600 MG 12 hr tablet Take 1 tablet (600 mg total) by mouth 2 (two) times daily as needed for cough or to loosen phlegm. 20 tablet 0  . ipratropium-albuterol (DUONEB) 0.5-2.5 (3) MG/3ML SOLN USE 3 ML VIA NEBULIZER EVERY 4 HOURS AS NEEDED FOR WHEEZING OR SHORTNESS OF BREATH 1080 mL 5  . levocetirizine (XYZAL) 5 MG tablet Take 5 mg by mouth every evening.    Marland Kitchen LORazepam (ATIVAN) 0.5 MG tablet Take 0.5 mg by mouth 2 (two) times daily.    . predniSONE (DELTASONE) 5 MG tablet Take 1 tablet (5 mg total) by mouth daily with breakfast. 30 tablet 1  . PROAIR HFA 108 (90 Base) MCG/ACT inhaler INHALE 2 PUFFS BY MOUTH EVERY 6 HOURS AS NEEDED FOR WHEEZING OR SHORTNESS OF BREATH. NEED APPOINTMENT FOR FUTURE REFILLS 25.5 g 6  . sertraline (ZOLOFT) 50 MG tablet Take 100 mg by mouth daily.     Marland Kitchen tiotropium (SPIRIVA HANDIHALER) 18 MCG inhalation capsule PLACE 1 CAPSULE INTO INHALER AND INHALE EVERY DAY 90 capsule 1    Musculoskeletal: Strength & Muscle Tone: within normal limits Gait & Station: normal Patient leans: Backward  Psychiatric Specialty Exam:  Presentation  General Appearance: Appropriate for Environment; Casual  Eye Contact:Fair  Speech:Slow  Speech Volume:Decreased  Handedness:Right   Mood and Affect  Mood:Anxious; Depressed; Hopeless  Affect:Depressed; Appropriate   Thought Process  Thought Processes:Goal Directed  Descriptions of Associations:Intact  Orientation:Full (Time, Place and Person)  Thought Content:Logical  History of Schizophrenia/Schizoaffective disorder:No data recorded Duration of Psychotic Symptoms:No data recorded Hallucinations:Hallucinations: None  Ideas of Reference:None  Suicidal Thoughts:Suicidal Thoughts: No  Homicidal Thoughts:Homicidal Thoughts: No   Sensorium  Memory:Immediate Fair; Recent Fair;  Remote Fair  Judgment:Fair  Insight:Good   Executive Functions  Concentration:Fair  Attention Span:Fair  Bland  of Knowledge:Good  Language:Good   Psychomotor Activity  Psychomotor Activity:Psychomotor Activity: Normal   Assets  Assets:Desire for Improvement; Financial Resources/Insurance; Physical Health; Social Support   Sleep  Sleep:Sleep: Fair   Physical Exam: Physical Exam Vitals and nursing note reviewed.  HENT:     Nose: Nose normal.     Mouth/Throat:     Mouth: Mucous membranes are dry.  Cardiovascular:     Rate and Rhythm: Normal rate.     Pulses: Normal pulses.  Pulmonary:     Effort: Pulmonary effort is normal.  Musculoskeletal:     Cervical back: Normal range of motion and neck supple.  Neurological:     Mental Status: She is alert.  Psychiatric:        Attention and Perception: Attention and perception normal.        Mood and Affect: Mood is depressed.        Speech: Speech is delayed.        Behavior: Behavior normal. Behavior is cooperative.        Thought Content: Thought content normal.        Cognition and Memory: Cognition normal.        Judgment: Judgment normal.    Review of Systems  Psychiatric/Behavioral: Positive for depression. The patient is nervous/anxious.   All other systems reviewed and are negative.  Blood pressure 119/83, pulse 100, temperature 98.7 F (37.1 C), temperature source Oral, resp. rate 18, height 5\' 2"  (1.575 m), weight 54.4 kg, SpO2 97 %. Body mass index is 21.95 kg/m.  Treatment Plan Summary: Daily contact with patient to assess and evaluate symptoms and progress in treatment and Plan The patient remained under observation overnight and will be reassessed in the a.m. to determine if she meets the criteria for psychiatric inpatient admission; she could be discharged back home.  Disposition: Supportive therapy provided about ongoing stressors. The patient remained under observation overnight and will be reassessed in the a.m. to determine if she meets the criteria for psychiatric inpatient admission; she could be discharged back  home.  Caroline Sauger, NP 05/23/2020 3:17 AM

## 2020-05-23 NOTE — ED Notes (Signed)
Pt dressed out in room 24  Pt refused to take off bra and underwear.  Pt calm.

## 2020-05-23 NOTE — ED Notes (Signed)
Pt dressed out into hospital provided scrubs with this tech and Amy, RN in the rm. Pt belongings consist of a pair of black shoes, white socks, blue jeans, black jacket, gray shirt, and a pink cell phone wallet case with a black cell phone. Pt refused to take off her panties and her sports bra. Pt belongings placed into a pt belongings bag and labeled with pt name.

## 2020-05-23 NOTE — ED Notes (Signed)
Dr. Weber Cooks at bedside at this time to speak with patient.

## 2020-05-23 NOTE — ED Notes (Signed)
Pt given belongings but wants to stay in blue scrubs. Wants to wait in lobby. Waiting for call back from family.

## 2020-05-23 NOTE — ED Notes (Signed)
Pt moved to room 24 from 21. Resumed care from [facility].  Pt calm and cooperative. Pt on 2 liters oxygen.  Pt is ivc.

## 2020-05-23 NOTE — Discharge Instructions (Addendum)
Please seek medical attention and help for any thoughts about wanting to harm yourself, harm others, any concerning change in behavior, severe depression, inappropriate drug use or any other new or concerning symptoms. ° °

## 2020-05-23 NOTE — ED Provider Notes (Signed)
Psychiatry has seen and evaluated patient. They have rescinded the IVC. Will discharge.    Nance Pear, MD 05/23/20 [id number]

## 2020-05-23 NOTE — ED Notes (Signed)
Patient refused shower,  But patient agreed to wash up at sink all supplies given to patient at this time.

## 2020-05-23 NOTE — ED Notes (Signed)
Pt resting in bed with lights dimmed for comfort. Remains on 2L via [place] at this time.

## 2020-05-23 NOTE — Consult Note (Signed)
[facility] Face-to-Face Psychiatry Consult   Reason for Consult: Consult for 60 year old woman following up on last night's evaluation.  Patient brought in under IVC filed by family because of anxiety and allegedly poor self-care Referring Physician: Archie Balboa Patient Identification: [name] [name] MRN:  [id number] Principal Diagnosis: PTSD (post-traumatic stress disorder) Diagnosis:  Principal Problem:   PTSD (post-traumatic stress disorder) Active Problems:   Acute on chronic respiratory failure (HCC)   COPD exacerbation ([place])   Acute bronchitis   Anxiety   Smoker   Emphysema lung ([place])   History of breast cancer   Persistent depressive disorder   Generalized anxiety disorder   Total Time spent with patient: 1 hour  Subjective:   [name] [name] is a 60 y.o. female patient admitted with "I am just tired".  HPI: Patient seen chart reviewed.  This is a 60 year old woman with multiple medical problems mostly related to chronic COPD.  Family reports that she is not taking her medicine.  Patient says she only missed her medicine once or twice because she feels too tired.  She says she does not have a general aversion to her medicine and intends to continue taking her medicine.  She says her nerves are bad and nervous much of the time particularly today from being in the hospital.  Denies feeling hopeless however.  Denies any suicidal or homicidal thought.  Denies any hallucinations or psychosis.  Past Psychiatric History: Patient has had previous hospitalizations with anxiety and at times possible psychotic symptoms but with a diagnosis of anxiety and depression.  Past medications have included Risperdal and Zoloft.  It looks like there have been times in the past when she was given Ativan but there was concern about that making her problems worse.  No evidence of current Ativan dosing.  No history of suicide attempts or violence  Risk to Self:   Risk to Others:   Prior Inpatient  Therapy:   Prior Outpatient Therapy:    Past Medical History:  Past Medical History:  Diagnosis Date  . Breast cancer ([place]) 2011   BILAT  lumpectomy 2011  . Cancer ([place])   . COPD (chronic obstructive pulmonary disease) ([place])   . Personal history of radiation therapy 2011   fu bil. breast cancer    Past Surgical History:  Procedure Laterality Date  . ABDOMINAL HYSTERECTOMY    . BREAST BIOPSY Left 2011   positive  . BREAST BIOPSY Right 2011   positive  . BREAST LUMPECTOMY Bilateral 2011   Family History:  Family History  Problem Relation Age of Onset  . Breast cancer Mother   . Lung cancer Mother    Family Psychiatric  History: None reported Social History:  Social History   Substance and Sexual Activity  Alcohol Use No     Social History   Substance and Sexual Activity  Drug Use No    Social History   Socioeconomic History  . Marital status: Single    Spouse name: Not on file  . Number of children: Not on file  . Years of education: Not on file  . Highest education level: Not on file  Occupational History  . Not on file  Tobacco Use  . Smoking status: Former Smoker    Packs/day: 0.50    Years: 40.00    Pack years: 20.00  . Smokeless tobacco: Never Used  . Tobacco comment: quit smoking 09/26/15  Substance and Sexual Activity  . Alcohol use: No  . Drug use: No  .  Sexual activity: Not on file  Other Topics Concern  . Not on file  Social History Narrative  . Not on file   Social Determinants of Health   Financial Resource Strain: Not on file  Food Insecurity: Not on file  Transportation Needs: Not on file  Physical Activity: Not on file  Stress: Not on file  Social Connections: Not on file   Additional Social History:    Allergies:   Allergies  Allergen Reactions  . Zithromax [Azithromycin] Other (See Comments)    Patient reports that medication makes breathing worse  . Penicillins Swelling, Rash and Other (See Comments)    Pt states that  she had tongue swelling.   Has patient had a PCN reaction causing immediate rash, facial/tongue/throat swelling, SOB or lightheadedness with hypotension: Yes Has patient had a PCN reaction causing severe rash involving mucus membranes or skin necrosis: No Has patient had a PCN reaction that required hospitalization No Has patient had a PCN reaction occurring within the last 10 years: No If all of the above answers are "NO", then may proceed with Cephalosporin use.    Labs:  Results for orders placed or performed during the hospital encounter of 05/22/20 (from the past 48 hour(s))  CBC     Status: Abnormal   Collection Time: 05/22/20  8:56 PM  Result Value Ref Range   WBC 7.0 4.0 - 10.5 K/uL   RBC 3.66 (L) 3.87 - 5.11 MIL/uL   Hemoglobin 10.3 (L) 12.0 - 15.0 g/dL   HCT 33.5 (L) 36.0 - 46.0 %   MCV 91.5 80.0 - 100.0 fL   MCH 28.1 26.0 - 34.0 pg   MCHC 30.7 30.0 - 36.0 g/dL   RDW 11.9 11.5 - 15.5 %   Platelets 203 150 - 400 K/uL   nRBC 0.0 0.0 - 0.2 %    Comment: Performed at [facility], [address] [place], [place] [address]  Comprehensive metabolic panel     Status: Abnormal   Collection Time: 05/22/20  8:56 PM  Result Value Ref Range   Sodium 137 135 - 145 mmol/L   Potassium 4.1 3.5 - 5.1 mmol/L   Chloride 97 (L) 98 - 111 mmol/L   CO2 32 22 - 32 mmol/L   Glucose, Bld 114 (H) 70 - 99 mg/dL    Comment: Glucose reference range applies only to samples taken after fasting for at least 8 hours.   BUN 21 8 - 23 mg/dL   Creatinine, Ser 0.54 0.44 - 1.00 mg/dL   Calcium 9.1 8.9 - 10.3 mg/dL   Total Protein 7.7 6.5 - 8.1 g/dL   Albumin 3.9 3.5 - 5.0 g/dL   AST 44 (H) 15 - 41 U/L   ALT 46 (H) 0 - 44 U/L   Alkaline Phosphatase 44 38 - 126 U/L   Total Bilirubin 0.7 0.3 - 1.2 mg/dL   GFR, Estimated >60 >60 mL/min    Comment: (NOTE) Calculated using the CKD-EPI Creatinine Equation (2021)    Anion gap 8 5 - 15    Comment: Performed at [facility], [address] [place], [place] [address]  Ethanol     Status: None   Collection Time: 05/22/20  8:56 PM  Result Value Ref Range   Alcohol, Ethyl (B) <10 <10 mg/dL    Comment: (NOTE) Lowest detectable limit for serum alcohol is 10 mg/dL.  For medical purposes only. Performed at [facility], [address], [place], [place] [address]  Blood gas, venous     Status: Abnormal   Collection Time: 05/22/20  9:05 PM  Result Value Ref Range   pH, Ven 7.39 7.250 - 7.430   pCO2, Ven 62 (H) 44.0 - 60.0 mmHg   pO2, Ven 70.0 (H) 32.0 - 45.0 mmHg   Bicarbonate 37.5 (H) 20.0 - 28.0 mmol/L   Acid-Base Excess 10.6 (H) 0.0 - 2.0 mmol/L   O2 Saturation 93.6 %   Patient temperature 37.0    Collection site VEIN    Sample type VENOUS     Comment: Performed at [facility], [address], [place], [place] [address]  Resp Panel by RT-PCR (Flu A&B, Covid) Nasopharyngeal Swab     Status: None   Collection Time: 05/22/20  9:18 PM   Specimen: Nasopharyngeal Swab; Nasopharyngeal(NP) swabs in vial transport medium  Result Value Ref Range   SARS Coronavirus 2 by RT PCR NEGATIVE NEGATIVE    Comment: (NOTE) SARS-CoV-2 target nucleic acids are NOT DETECTED.  The SARS-CoV-2 RNA is generally detectable in upper respiratory specimens during the acute phase of infection. The lowest concentration of SARS-CoV-2 viral copies this assay can detect is 138 copies/mL. A negative result does not preclude SARS-Cov-2 infection and should not be used as the sole basis for treatment or other patient management decisions. A negative result may occur with  improper specimen collection/handling, submission of specimen other than nasopharyngeal swab, presence of viral mutation(s) within the areas targeted by this assay, and inadequate number of viral copies(<138 copies/mL). A negative result must be combined with clinical observations, patient history, and epidemiological information. The expected result is  Negative.  Fact Sheet for Patients:  EntrepreneurPulse.com.au  Fact Sheet for Healthcare Providers:  IncredibleEmployment.be  This test is no t yet approved or cleared by the [place] FDA and  has been authorized for detection and/or diagnosis of SARS-CoV-2 by FDA under an Emergency Use Authorization (EUA). This EUA will remain  in effect (meaning this test can be used) for the duration of the COVID-19 declaration under Section 564(b)(1) of the Act, 21 U.S.C.section 360bbb-3(b)(1), unless the authorization is terminated  or revoked sooner.       Influenza A by PCR NEGATIVE NEGATIVE   Influenza B by PCR NEGATIVE NEGATIVE    Comment: (NOTE) The Xpert Xpress SARS-CoV-2/FLU/RSV plus assay is intended as an aid in the diagnosis of influenza from Nasopharyngeal swab specimens and should not be used as a sole basis for treatment. Nasal washings and aspirates are unacceptable for Xpert Xpress SARS-CoV-2/FLU/RSV testing.  Fact Sheet for Patients: EntrepreneurPulse.com.au  Fact Sheet for Healthcare Providers: IncredibleEmployment.be  This test is not yet approved or cleared by the [place] FDA and has been authorized for detection and/or diagnosis of SARS-CoV-2 by FDA under an Emergency Use Authorization (EUA). This EUA will remain in effect (meaning this test can be used) for the duration of the COVID-19 declaration under Section 564(b)(1) of the Act, 21 U.S.C. section 360bbb-3(b)(1), unless the authorization is terminated or revoked.  Performed at [facility], [address], [place], [place] [address]     Current Facility-Administered Medications  Medication Dose Route Frequency Provider Last Rate Last Admin  . ipratropium-albuterol (DUONEB) 0.5-2.5 (3) MG/3ML nebulizer solution 3 mL  3 mL Nebulization Q4H PRN Merlyn Lot, MD      . mometasone-formoterol [facility]) 200-5 MCG/ACT  inhaler 2 puff  2 puff Inhalation Q0600 Merlyn Lot, MD       Current Outpatient Medications  Medication Sig  Dispense Refill  . risperiDONE (RISPERDAL) 1 MG tablet Take 1 tablet (1 mg total) by mouth 2 (two) times daily. 60 tablet 1  . sertraline (ZOLOFT) 100 MG tablet Take 1 tablet (100 mg total) by mouth daily. 30 tablet 1  . aspirin 81 MG EC tablet Take 1 tablet (81 mg total) by mouth daily. 30 tablet 1  . cetirizine (ZYRTEC ALLERGY) 10 MG tablet Take 1 tablet (10 mg total) by mouth daily. 30 tablet 6  . DULERA 200-5 MCG/ACT AERO INHALE 2 PUFFS INTO LUNGS TWICE DAILY 39 g 3  . fluticasone (FLONASE) 50 MCG/ACT nasal spray Place 1 spray into both nostrils daily. 1 g 5  . fluticasone-salmeterol (ADVAIR HFA) 230-21 MCG/ACT inhaler Inhale 2 puffs into the lungs 2 (two) times daily. 1 Inhaler 1  . guaiFENesin (MUCINEX) 600 MG 12 hr tablet Take 1 tablet (600 mg total) by mouth 2 (two) times daily as needed for cough or to loosen phlegm. 20 tablet 0  . ipratropium-albuterol (DUONEB) 0.5-2.5 (3) MG/3ML SOLN USE 3 ML VIA NEBULIZER EVERY 4 HOURS AS NEEDED FOR WHEEZING OR SHORTNESS OF BREATH 1080 mL 5  . levocetirizine (XYZAL) 5 MG tablet Take 5 mg by mouth every evening.    Marland Kitchen LORazepam (ATIVAN) 0.5 MG tablet Take 0.5 mg by mouth 2 (two) times daily.    . predniSONE (DELTASONE) 5 MG tablet Take 1 tablet (5 mg total) by mouth daily with breakfast. 30 tablet 1  . PROAIR HFA 108 (90 Base) MCG/ACT inhaler INHALE 2 PUFFS BY MOUTH EVERY 6 HOURS AS NEEDED FOR WHEEZING OR SHORTNESS OF BREATH. NEED APPOINTMENT FOR FUTURE REFILLS 25.5 g 6  . tiotropium (SPIRIVA HANDIHALER) 18 MCG inhalation capsule PLACE 1 CAPSULE INTO INHALER AND INHALE EVERY DAY 90 capsule 1    Musculoskeletal: Strength & Muscle Tone: within normal limits Gait & Station: normal Patient leans: N/A            Psychiatric Specialty Exam:  Presentation  General Appearance: Appropriate for Environment; Casual  Eye  Contact:Fair  Speech:Slow  Speech Volume:Decreased  Handedness:Right   Mood and Affect  Mood:Anxious; Depressed; Hopeless  Affect:Depressed; Appropriate   Thought Process  Thought Processes:Goal Directed  Descriptions of Associations:Intact  Orientation:Full (Time, Place and Person)  Thought Content:Logical  History of Schizophrenia/Schizoaffective disorder:No data recorded Duration of Psychotic Symptoms:No data recorded Hallucinations:Hallucinations: None  Ideas of Reference:None  Suicidal Thoughts:Suicidal Thoughts: No  Homicidal Thoughts:Homicidal Thoughts: No   Sensorium  Memory:Immediate Fair; Recent Fair; Remote Fair  Judgment:Fair  Insight:Good   Executive Functions  Concentration:Fair  Attention Span:Fair  [place]  Language:Good   Psychomotor Activity  Psychomotor Activity:Psychomotor Activity: Normal   Assets  Assets:Desire for Improvement; Financial Resources/Insurance; Physical Health; Social Support   Sleep  Sleep:Sleep: Fair   Physical Exam: Physical Exam Vitals and nursing note reviewed.  Constitutional:      Appearance: Normal appearance.  HENT:     Head: Normocephalic and atraumatic.     Mouth/Throat:     Pharynx: Oropharynx is clear.  Eyes:     Pupils: Pupils are equal, round, and reactive to light.  Cardiovascular:     Rate and Rhythm: Normal rate and regular rhythm.  Pulmonary:     Effort: Pulmonary effort is normal.     Breath sounds: Normal breath sounds.     Comments: Patient currently on oxygen Abdominal:     General: Abdomen is flat.     Palpations: Abdomen is soft.  Musculoskeletal:  General: Normal range of motion.  Skin:    General: Skin is warm and dry.  Neurological:     General: No focal deficit present.     Mental Status: She is alert. Mental status is at baseline.  Psychiatric:        Attention and Perception: Attention normal.        Mood and Affect: Mood  is anxious.        Speech: Speech is delayed.        Behavior: Behavior is withdrawn.        Thought Content: Thought content normal. Thought content is not paranoid or delusional. Thought content does not include homicidal or suicidal ideation.        Cognition and Memory: Memory is impaired.        Judgment: Judgment normal.    Review of Systems  Constitutional: Negative.   HENT: Negative.   Eyes: Negative.   Respiratory: Negative.   Cardiovascular: Negative.   Gastrointestinal: Negative.   Musculoskeletal: Negative.   Skin: Negative.   Neurological: Negative.   Psychiatric/Behavioral: Positive for memory loss. Negative for depression, hallucinations, substance abuse and suicidal ideas. The patient is nervous/anxious. The patient does not have insomnia.    Blood pressure 118/82, pulse 93, temperature 97.8 F (36.6 C), temperature source Oral, resp. rate 20, height 5\' 2"  (1.575 m), weight 54.4 kg, SpO2 97 %. Body mass index is 21.95 kg/m.  Treatment Plan Summary: Medication management and Plan Patient currently appears to be rundown not feeling well having to use oxygen.  Mentally however while she appears a little dysphoric she denies being depressed and she denies hopelessness.  Denies suicidal thought.  No evidence of suicidality.  Denies hallucinations no evidence of responding to internal stimuli.  Patient does not appear to be psychotic.  She does not currently meet criteria for involuntary commitment.  I have prepared new prescriptions for her Zoloft and Risperdal based on previous dosing and strongly encouraged her to continue following up with [place] [facility].  Case reviewed with emergency room physician.  Disposition: No evidence of imminent risk to self or others at present.   Patient does not meet criteria for psychiatric inpatient admission. Supportive therapy provided about ongoing stressors.  Alethia Berthold, MD 05/23/2020 11:33 AM

## 2020-05-23 NOTE — ED Notes (Signed)
Pt walked out to daughter with officer in NAD. Ambulatory. Rx given to pt.

## 2020-05-23 NOTE — ED Notes (Signed)
Pt to the door asking to go home and go to her family's house, this RN explained again patient waiting to see psychiatrist. Pt back to bed, put her oxygen back on.

## 2020-05-23 NOTE — ED Notes (Signed)
Lanita, daughter, called but did not answer.

## 2020-05-23 NOTE — ED Notes (Signed)
Pt sleeping. 

## 2020-06-08 ENCOUNTER — Other Ambulatory Visit: Payer: Self-pay | Admitting: Internal Medicine

## 2020-07-31 ENCOUNTER — Telehealth: Payer: Self-pay | Admitting: Internal Medicine

## 2020-07-31 NOTE — Telephone Encounter (Signed)
Lm for patient.  

## 2020-08-01 ENCOUNTER — Telehealth: Payer: Self-pay | Admitting: Internal Medicine

## 2020-08-01 NOTE — Telephone Encounter (Signed)
lm for patient

## 2020-08-01 NOTE — Telephone Encounter (Signed)
Lm x2 for patient.  Will close encounter per office protocol.   

## 2020-08-02 ENCOUNTER — Other Ambulatory Visit: Payer: Self-pay | Admitting: Primary Care

## 2020-08-02 DIAGNOSIS — Z1231 Encounter for screening mammogram for malignant neoplasm of breast: Secondary | ICD-10-CM

## 2020-08-02 MED ORDER — TRELEGY ELLIPTA 100-62.5-25 MCG/INH IN AEPB: 1.0000 | INHALATION_SPRAY | Freq: Every day | RESPIRATORY_TRACT | 0 refills | Status: DC

## 2020-08-02 NOTE — Telephone Encounter (Signed)
Spoke to patient, who stated that during her recent admission she was prescribed Trelegy 100. She is requesting a RX. Patient last seen 03/2019 with pending OV for 08/20/2020.  Dr. Mortimer Fries, please advise. Thanks

## 2020-08-02 NOTE — Telephone Encounter (Signed)
Rx for Trelegy 100 has been sent to preferred pharmacy.  Patient is aware and voiced her understanding.  Nothing further needed.

## 2020-08-20 ENCOUNTER — Ambulatory Visit: Payer: Medicare Other | Admitting: Internal Medicine

## 2020-09-03 DEATH — deceased

## 2020-09-08 ENCOUNTER — Other Ambulatory Visit: Payer: Self-pay | Admitting: Internal Medicine
